# Patient Record
Sex: Female | Born: 1955 | Race: Black or African American | Hispanic: No | Marital: Married
Health system: Midwestern US, Community
[De-identification: ages and names within clinical notes are randomized; demographics above are authoritative.]

## PROBLEM LIST (undated history)

## (undated) DIAGNOSIS — Z4651 Encounter for fitting and adjustment of gastric lap band: Secondary | ICD-10-CM

## (undated) DIAGNOSIS — F419 Anxiety disorder, unspecified: Secondary | ICD-10-CM

## (undated) DIAGNOSIS — B3731 Acute candidiasis of vulva and vagina: Secondary | ICD-10-CM

## (undated) DIAGNOSIS — K219 Gastro-esophageal reflux disease without esophagitis: Secondary | ICD-10-CM

## (undated) DIAGNOSIS — I2510 Atherosclerotic heart disease of native coronary artery without angina pectoris: Secondary | ICD-10-CM

## (undated) DIAGNOSIS — N183 Chronic kidney disease, stage 3 unspecified: Secondary | ICD-10-CM

## (undated) DIAGNOSIS — G8929 Other chronic pain: Secondary | ICD-10-CM

## (undated) DIAGNOSIS — E7800 Pure hypercholesterolemia, unspecified: Secondary | ICD-10-CM

## (undated) DIAGNOSIS — I10 Essential (primary) hypertension: Secondary | ICD-10-CM

## (undated) DIAGNOSIS — M797 Fibromyalgia: Secondary | ICD-10-CM

## (undated) DIAGNOSIS — M5410 Radiculopathy, site unspecified: Secondary | ICD-10-CM

## (undated) DIAGNOSIS — M545 Low back pain, unspecified: Secondary | ICD-10-CM

## (undated) DIAGNOSIS — Z8711 Personal history of peptic ulcer disease: Secondary | ICD-10-CM

## (undated) DIAGNOSIS — D649 Anemia, unspecified: Secondary | ICD-10-CM

## (undated) DIAGNOSIS — I803 Phlebitis and thrombophlebitis of lower extremities, unspecified: Secondary | ICD-10-CM

## (undated) DIAGNOSIS — Z8719 Personal history of other diseases of the digestive system: Secondary | ICD-10-CM

## (undated) DIAGNOSIS — Z9289 Personal history of other medical treatment: Secondary | ICD-10-CM

## (undated) DIAGNOSIS — F32A Depression, unspecified: Secondary | ICD-10-CM

## (undated) DIAGNOSIS — R519 Headache, unspecified: Secondary | ICD-10-CM

## (undated) DIAGNOSIS — R51 Headache: Secondary | ICD-10-CM

## (undated) DIAGNOSIS — F329 Major depressive disorder, single episode, unspecified: Secondary | ICD-10-CM

## (undated) DIAGNOSIS — G894 Chronic pain syndrome: Secondary | ICD-10-CM

## (undated) DIAGNOSIS — G43909 Migraine, unspecified, not intractable, without status migrainosus: Secondary | ICD-10-CM

## (undated) DIAGNOSIS — M1990 Unspecified osteoarthritis, unspecified site: Secondary | ICD-10-CM

## (undated) HISTORY — DX: Radiculopathy, site unspecified: M54.10

## (undated) HISTORY — DX: Chronic pain syndrome: G89.4

## (undated) HISTORY — PX: REVISION TOTAL KNEE ARTHROPLASTY: SHX767

## (undated) HISTORY — PX: LUMBAR FUSION: SHX111

## (undated) HISTORY — PX: RIGHT OOPHORECTOMY: SHX2359

## (undated) HISTORY — PX: JOINT REPLACEMENT: SHX530

## (undated) HISTORY — DX: Atherosclerotic heart disease of native coronary artery without angina pectoris: I25.10

## (undated) HISTORY — PX: LACERATION REPAIR: SHX5168

## (undated) HISTORY — PX: SHOULDER OPEN ROTATOR CUFF REPAIR: SHX2407

## (undated) HISTORY — PX: BACK SURGERY: SHX140

## (undated) HISTORY — DX: Pure hypercholesterolemia, unspecified: E78.00

## (undated) HISTORY — PX: TUBAL LIGATION: SHX77

## (undated) HISTORY — PX: KNEE ARTHROSCOPY: SHX127

## (undated) HISTORY — DX: Anxiety disorder, unspecified: F41.9

## (undated) HISTORY — PX: HIP SURGERY: SHX245

## (undated) HISTORY — PX: FOREARM FRACTURE SURGERY: SHX649

## (undated) HISTORY — DX: Major depressive disorder, single episode, unspecified: F32.9

## (undated) HISTORY — PX: TONSILLECTOMY: SUR1361

## (undated) HISTORY — DX: Depression, unspecified: F32.A

## (undated) HISTORY — PX: TOTAL KNEE ARTHROPLASTY: SHX125

## (undated) HISTORY — PX: ANKLE FRACTURE SURGERY: SHX122

---

## 1975-05-10 HISTORY — PX: CHOLECYSTECTOMY OPEN: SUR202

## 1975-05-10 HISTORY — PX: APPENDECTOMY: SHX54

## 1991-05-10 HISTORY — PX: ABDOMINAL HYSTERECTOMY: SHX81

## 1999-10-14 ENCOUNTER — Ambulatory Visit (HOSPITAL_COMMUNITY): Admission: RE | Admit: 1999-10-14 | Discharge: 1999-10-14 | Payer: Self-pay | Admitting: Gastroenterology

## 1999-10-14 ENCOUNTER — Encounter: Payer: Self-pay | Admitting: Gastroenterology

## 1999-10-26 ENCOUNTER — Encounter: Payer: Self-pay | Admitting: Orthopedic Surgery

## 1999-10-26 ENCOUNTER — Encounter: Admission: RE | Admit: 1999-10-26 | Discharge: 1999-10-26 | Payer: Self-pay | Admitting: Orthopedic Surgery

## 2000-09-25 ENCOUNTER — Encounter: Payer: Self-pay | Admitting: Orthopedic Surgery

## 2000-09-25 ENCOUNTER — Ambulatory Visit (HOSPITAL_COMMUNITY): Admission: RE | Admit: 2000-09-25 | Discharge: 2000-09-25 | Payer: Self-pay | Admitting: Orthopedic Surgery

## 2000-10-30 ENCOUNTER — Encounter: Payer: Self-pay | Admitting: Neurosurgery

## 2000-10-30 ENCOUNTER — Encounter: Admission: RE | Admit: 2000-10-30 | Discharge: 2000-10-30 | Payer: Self-pay | Admitting: Neurosurgery

## 2000-11-13 ENCOUNTER — Encounter: Admission: RE | Admit: 2000-11-13 | Discharge: 2000-11-13 | Payer: Self-pay | Admitting: Neurosurgery

## 2000-11-13 ENCOUNTER — Encounter: Payer: Self-pay | Admitting: Neurosurgery

## 2000-12-04 ENCOUNTER — Encounter: Payer: Self-pay | Admitting: Neurosurgery

## 2000-12-04 ENCOUNTER — Encounter: Admission: RE | Admit: 2000-12-04 | Discharge: 2000-12-04 | Payer: Self-pay | Admitting: Neurosurgery

## 2001-01-16 ENCOUNTER — Ambulatory Visit (HOSPITAL_COMMUNITY): Admission: RE | Admit: 2001-01-16 | Discharge: 2001-01-16 | Payer: Self-pay | Admitting: Orthopedic Surgery

## 2001-01-16 ENCOUNTER — Encounter: Payer: Self-pay | Admitting: Orthopedic Surgery

## 2001-03-29 ENCOUNTER — Encounter: Payer: Self-pay | Admitting: Emergency Medicine

## 2001-03-29 ENCOUNTER — Inpatient Hospital Stay (HOSPITAL_COMMUNITY): Admission: EM | Admit: 2001-03-29 | Discharge: 2001-04-07 | Payer: Self-pay | Admitting: Emergency Medicine

## 2001-03-30 ENCOUNTER — Encounter: Payer: Self-pay | Admitting: Neurosurgery

## 2001-03-31 ENCOUNTER — Encounter: Payer: Self-pay | Admitting: Internal Medicine

## 2001-03-31 ENCOUNTER — Encounter: Payer: Self-pay | Admitting: Infectious Diseases

## 2001-04-02 ENCOUNTER — Encounter: Payer: Self-pay | Admitting: Internal Medicine

## 2001-04-03 ENCOUNTER — Encounter: Payer: Self-pay | Admitting: Internal Medicine

## 2001-04-04 ENCOUNTER — Encounter: Payer: Self-pay | Admitting: Internal Medicine

## 2001-12-19 ENCOUNTER — Encounter: Payer: Self-pay | Admitting: Orthopedic Surgery

## 2001-12-19 ENCOUNTER — Encounter: Admission: RE | Admit: 2001-12-19 | Discharge: 2001-12-19 | Payer: Self-pay | Admitting: Orthopedic Surgery

## 2001-12-26 ENCOUNTER — Encounter: Payer: Self-pay | Admitting: Orthopedic Surgery

## 2001-12-31 ENCOUNTER — Inpatient Hospital Stay (HOSPITAL_COMMUNITY): Admission: RE | Admit: 2001-12-31 | Discharge: 2002-01-03 | Payer: Self-pay | Admitting: Orthopedic Surgery

## 2002-08-14 ENCOUNTER — Encounter: Payer: Self-pay | Admitting: Orthopedic Surgery

## 2002-08-14 ENCOUNTER — Encounter: Admission: RE | Admit: 2002-08-14 | Discharge: 2002-08-14 | Payer: Self-pay | Admitting: Orthopedic Surgery

## 2003-01-14 ENCOUNTER — Encounter: Payer: Self-pay | Admitting: Orthopedic Surgery

## 2003-01-14 ENCOUNTER — Ambulatory Visit (HOSPITAL_COMMUNITY): Admission: RE | Admit: 2003-01-14 | Discharge: 2003-01-14 | Payer: Self-pay | Admitting: Orthopedic Surgery

## 2003-02-17 ENCOUNTER — Inpatient Hospital Stay (HOSPITAL_COMMUNITY): Admission: RE | Admit: 2003-02-17 | Discharge: 2003-02-23 | Payer: Self-pay | Admitting: Orthopedic Surgery

## 2003-02-20 ENCOUNTER — Encounter: Payer: Self-pay | Admitting: Orthopedic Surgery

## 2003-03-07 ENCOUNTER — Ambulatory Visit (HOSPITAL_COMMUNITY): Admission: RE | Admit: 2003-03-07 | Discharge: 2003-03-07 | Payer: Self-pay | Admitting: Orthopedic Surgery

## 2003-05-10 HISTORY — PX: CARDIAC CATHETERIZATION: SHX172

## 2003-10-27 ENCOUNTER — Encounter: Admission: RE | Admit: 2003-10-27 | Discharge: 2003-10-27 | Payer: Self-pay | Admitting: Interventional Cardiology

## 2003-10-28 ENCOUNTER — Inpatient Hospital Stay (HOSPITAL_BASED_OUTPATIENT_CLINIC_OR_DEPARTMENT_OTHER): Admission: RE | Admit: 2003-10-28 | Discharge: 2003-10-28 | Payer: Self-pay | Admitting: Interventional Cardiology

## 2004-11-27 ENCOUNTER — Ambulatory Visit (HOSPITAL_COMMUNITY): Admission: RE | Admit: 2004-11-27 | Discharge: 2004-11-27 | Payer: Self-pay | Admitting: Neurosurgery

## 2005-01-17 ENCOUNTER — Encounter: Admission: RE | Admit: 2005-01-17 | Discharge: 2005-01-17 | Payer: Self-pay | Admitting: Neurosurgery

## 2005-02-22 ENCOUNTER — Encounter: Admission: RE | Admit: 2005-02-22 | Discharge: 2005-02-22 | Payer: Self-pay | Admitting: Neurosurgery

## 2005-03-08 ENCOUNTER — Encounter: Admission: RE | Admit: 2005-03-08 | Discharge: 2005-03-08 | Payer: Self-pay | Admitting: Neurosurgery

## 2005-03-23 ENCOUNTER — Encounter: Admission: RE | Admit: 2005-03-23 | Discharge: 2005-03-23 | Payer: Self-pay | Admitting: Neurosurgery

## 2005-08-17 ENCOUNTER — Encounter: Admission: RE | Admit: 2005-08-17 | Discharge: 2005-08-17 | Payer: Self-pay | Admitting: Neurosurgery

## 2005-08-31 ENCOUNTER — Encounter: Admission: RE | Admit: 2005-08-31 | Discharge: 2005-08-31 | Payer: Self-pay | Admitting: Neurosurgery

## 2005-09-14 ENCOUNTER — Encounter: Admission: RE | Admit: 2005-09-14 | Discharge: 2005-09-14 | Payer: Self-pay | Admitting: Neurosurgery

## 2005-09-28 ENCOUNTER — Other Ambulatory Visit: Admission: RE | Admit: 2005-09-28 | Discharge: 2005-09-28 | Payer: Self-pay | Admitting: Internal Medicine

## 2005-10-27 ENCOUNTER — Encounter: Admission: RE | Admit: 2005-10-27 | Discharge: 2005-10-27 | Payer: Self-pay | Admitting: Orthopedic Surgery

## 2005-11-15 ENCOUNTER — Encounter: Admission: RE | Admit: 2005-11-15 | Discharge: 2005-11-15 | Payer: Self-pay | Admitting: Internal Medicine

## 2005-11-23 ENCOUNTER — Emergency Department (HOSPITAL_COMMUNITY): Admission: EM | Admit: 2005-11-23 | Discharge: 2005-11-23 | Payer: Self-pay | Admitting: Emergency Medicine

## 2005-12-01 ENCOUNTER — Emergency Department (HOSPITAL_COMMUNITY): Admission: EM | Admit: 2005-12-01 | Discharge: 2005-12-01 | Payer: Self-pay | Admitting: Emergency Medicine

## 2006-04-10 ENCOUNTER — Ambulatory Visit (HOSPITAL_COMMUNITY): Admission: RE | Admit: 2006-04-10 | Discharge: 2006-04-11 | Payer: Self-pay | Admitting: Orthopedic Surgery

## 2006-05-09 HISTORY — PX: LUMBAR LAMINECTOMY/DECOMPRESSION MICRODISCECTOMY: SHX5026

## 2006-05-09 HISTORY — PX: ESOPHAGOGASTRODUODENOSCOPY: SHX1529

## 2006-05-30 ENCOUNTER — Inpatient Hospital Stay (HOSPITAL_COMMUNITY): Admission: RE | Admit: 2006-05-30 | Discharge: 2006-06-05 | Payer: Self-pay | Admitting: Neurosurgery

## 2006-11-15 ENCOUNTER — Encounter: Admission: RE | Admit: 2006-11-15 | Discharge: 2006-11-15 | Payer: Self-pay | Admitting: Gastroenterology

## 2007-03-29 ENCOUNTER — Encounter: Admission: RE | Admit: 2007-03-29 | Discharge: 2007-03-29 | Payer: Self-pay | Admitting: Internal Medicine

## 2007-11-07 ENCOUNTER — Encounter: Admission: RE | Admit: 2007-11-07 | Discharge: 2007-11-07 | Payer: Self-pay | Admitting: Internal Medicine

## 2008-12-23 ENCOUNTER — Encounter: Admission: RE | Admit: 2008-12-23 | Discharge: 2008-12-23 | Payer: Self-pay | Admitting: Neurosurgery

## 2009-01-30 ENCOUNTER — Encounter: Admission: RE | Admit: 2009-01-30 | Discharge: 2009-01-30 | Payer: Self-pay | Admitting: Neurosurgery

## 2009-03-12 ENCOUNTER — Encounter: Admission: RE | Admit: 2009-03-12 | Discharge: 2009-03-12 | Payer: Self-pay | Admitting: Neurosurgery

## 2009-04-07 ENCOUNTER — Encounter: Admission: RE | Admit: 2009-04-07 | Discharge: 2009-04-07 | Payer: Self-pay | Admitting: Neurosurgery

## 2010-01-20 ENCOUNTER — Encounter: Admission: RE | Admit: 2010-01-20 | Discharge: 2010-01-20 | Payer: Self-pay | Admitting: Internal Medicine

## 2010-05-30 ENCOUNTER — Encounter: Payer: Self-pay | Admitting: Internal Medicine

## 2010-08-05 ENCOUNTER — Other Ambulatory Visit (HOSPITAL_COMMUNITY)
Admission: RE | Admit: 2010-08-05 | Discharge: 2010-08-05 | Disposition: A | Discharge status: Home / Self Care | Payer: PRIVATE HEALTH INSURANCE | Source: Ambulatory Visit | Attending: Internal Medicine | Admitting: Internal Medicine

## 2010-08-05 ENCOUNTER — Other Ambulatory Visit: Payer: Self-pay | Admitting: Internal Medicine

## 2010-08-05 DIAGNOSIS — Z01419 Encounter for gynecological examination (general) (routine) without abnormal findings: Secondary | ICD-10-CM | POA: Insufficient documentation

## 2010-08-05 DIAGNOSIS — R87810 Cervical high risk human papillomavirus (HPV) DNA test positive: Secondary | ICD-10-CM | POA: Insufficient documentation

## 2010-09-24 NOTE — Discharge Summary (Signed)
[name], [name]                      ACCOUNT NO.:  [id number]   MEDICAL RECORD NO.:  [id number]                   PATIENT TYPE:  INP   LOCATION:  5027                                 FACILITY:  [facility]   PHYSICIAN:  Mila Homer. Sherlean Foot, M.D.              DATE OF BIRTH:  02-Nov-1955   DATE OF ADMISSION:  12/31/2001  DATE OF DISCHARGE:  01/03/2002                                 DISCHARGE SUMMARY   ADMISSION DIAGNOSES:  1. Painful left total knee arthroplasty with radiographic signs of     loosening.  2. Painful right total knee.  3. Gastroesophageal reflux disease.  4. Hiatal hernia.  5. Pseudogout.  6. Peptic ulcer disease.  7. Right Achilles' tendinitis.  8. Obesity.  9. Nocturnal leg cramps.   DISCHARGE DIAGNOSES:  1. Revision of left total knee arthroplasty.  2. Gastroesophageal reflux disease.  3. History of pseudogout.  4. History of hiatal hernia.  5. History of peptic ulcer disease.  6. Obesity.   HISTORY OF PRESENT ILLNESS:  The patient is a 55 year old white female who  had a right total knee arthroplasty in 1998, a left total knee arthroplasty  in 1998 also.  The patient has had about three to four months of gradual-  onset, progressively worsening left knee pain.  It is dull, throbbing and  sharp at times with weightbearing activities.  The pain is diffusely about  the knee with some radiation into the proximal tibia.  It increases with  weightbearing, improves slightly with Vicodin and heating pad and rest.  The  patient does ambulate with a crutch.   X-rays do show possible loosening of the tibial component.   ALLERGIES:  ASPIRIN, CODEINE, IV DYE and SHELLFISH.   CURRENT MEDICATIONS:  1. Estradiol 1 mg p.o. q.d.  2. Xanax 0.5 mg one-half tablet p.o. t.i.d.  3. Vicodin EF one to two tablets p.o. q.4-6h. p.r.n.  4. Caldesene 0.6 mg p.o. b.i.d.  5. Lodine 400 mg p.o. b.i.d.  6. Quinine sulfate 260 mg p.o. b.i.d.  7. Prilosec p.r.n.  8. Propulsid 20  mg p.o. p.r.n.  9. Mylanta p.r.n.   SURGICAL PROCEDURE:  On December 31, 2001, the patient was taken to the  operating room by Dr. Mila Homer. Lucey, assisted by Dr. [name] and  Jamelle Rushing, P.A.  Under general anesthesia, the patient underwent a left  total knee revision with femoral and tibial components.  One Hemovac drain  was left in place.  Estimated blood loss was 300 cc.  The patient tolerated  the procedure well, received a postoperative femoral nerve block and was  transferred to the recovery room and then to the orthopedic floor in good  condition.   CONSULTATIONS:  The following routine consults were requested:  Physical  therapy, occupational therapy, case management.   HOSPITAL COURSE:  On December 31, 2001, the patient was admitted to [facility] under  the care of Dr. Georgena Spurling.  The patient was taken to the  OR where revision of her left total knee arthroplasty was performed.  The  patient tolerated the procedure well, had a postoperative femoral nerve  block and was transferred to the recovery room and then to the orthopedic  floor in good condition for routine postop protocol.  The patient was  started on Lovenox for routine DVT prophylaxis.   The patient then incurred a total of three days' postoperative care on the  orthopedic floor.  Her vital signs remained stable.  She did have some low-  grade temperatures but they resolved on their own without any treatment.  The patient's wound remained benign for any signs of infection.  The leg  remained neuromotor and vascularly intact.  The patient did develop some  lumbar discomfort; this was very similar to the previous hospitalization  which was diagnosed as pseudogout.  She was given a Depo-Medrol injection  for this with gradual improvement.  The patient did very well with physical  therapy and she was ready for discharge to home on postop day #3.   LABORATORY AND ACCESSORY CLINICAL DATA:  CBC on  August 28th:  WBC was 8.3,  hemoglobin 9.1, hematocrit 27.7, platelets 187,000.  Routine chemistries on  August 27th:  Sodium of 138, potassium of 3.7, glucose 101, BUN 8,  creatinine 0.9.  Routine urinalysis on admission was normal.  Left knee  cultures during procedure were all negative of organisms.   MEDICATIONS UPON DISCHARGE FROM ORTHOPEDIC FLOOR:  1. Colace 100 mg p.o. b.i.d.  2. Trinsicon one tablet p.o. t.i.d.  3. Lovenox 30 mg subcut. q.12h.  4. Estradiol 1 mg p.o. q.d.  5. Colchicine 0.6 mg p.o. b.i.d.  6. Quinine sulfate 260 mg p.o. b.i.d.  7. Protonix 40 mg p.o. q.d.  8. Xanax 0.25 mg p.o. t.i.d.  9. Vioxx 25 mg p.o. q.d.  10.      OxyContin CR 10 mg p.o. q.12h.  11.      Laxative or enema of choice p.r.n.  12.      Restoril 15 mg p.o. q.h.s. p.r.n.  13.      Tylenol 650 mg p.o. q.4h. p.r.n.   MEDICATIONS:  1. The patient is to continue on routine home medications.  2. OxyContin CR 10 mg one tablet every 12 hours.  3. Percocet 5 mg one or two tablets every four to six hours for pain if     needed.  4. Lovenox injection 40 mg -- one injection a day for 10 days.  5. Pain medicine -- OxyContin CR and Percocet as noted above.   ACTIVITY:  Activity as tolerated with the use of a walker, may shower, no  driving.   DIET:  No restrictions.   WOUND CARE:  Keep wound clean and dry, check daily for any signs of  infection, call M.D. if any questions.   SPECIAL INSTRUCTIONS:  CPM 0 to 50 degrees, increased by 10 degrees a day.   FOLLOWUP:  Call [phone] for a followup appointment with Dr. Sherlean Foot in 10  days.   CONDITION ON DISCHARGE:  The patient's condition upon discharge from the  orthopedic floor is listed as improved and good.      Jamelle Rushing, P.A.                      Mila Homer. Sherlean Foot, M.D.    RWK/MEDQ  D:  01/21/2002  T:  01/22/2002  Job:  [id number]   cc:   Corwin Levins, M.D. [facility]

## 2010-09-24 NOTE — H&P (Signed)
[name], [name] NO.:  [id number]   MEDICAL RECORD NO.:  [id number]          PATIENT TYPE:  INP   LOCATION:  3310                         FACILITY:  [facility]   PHYSICIAN:  Payton Doughty, M.D.      DATE OF BIRTH:  Jan 11, 1956   DATE OF ADMISSION:  05/30/2006  DATE OF DISCHARGE:                              HISTORY & PHYSICAL   ADMITTING DIAGNOSIS:  Spondylosis, L3-4, L4-5.   SERVICE:  Neurosurgery.   HISTORY:  This is a now 55 year old right-handed white woman.  She has  had numerous orthopedic procedures.  I first saw her in June of 2002.  She had a foraminal disk at 3-4.  She had been managed conservatively  for a number of years now.  She developed increasing pain and pain down  both legs, worse on the right than on the left, and recent MR and  diskography demonstrated significant spondylosis at L3-4 and L4-5, and  she is now admitted for fusion at this level.  She was really going to  have this operation in September.  Her husband was sick.  He has since  passed away, and she is ready for her surgery now.   MEDICAL HISTORY:  Remarkable for menopause.   MEDICATIONS:  She is on estradiol __________, alprazolam 3 times a day,  Vicodin 4 times a day, and Ultram 4 times a day.   ALLERGIES:  She is sensitive to ASPIRIN.   SURGICAL HISTORY:  Remarkable for knee arthroscopies on both sides.  She  has had both knees replaced in the late 1990s.  Shoulder operations in  the 1990s.  Trochanteric bursitis operation in each hip, cholecystectomy  and tonsillectomy in the remote past.   SOCIAL HISTORY:  She does not smoke, does not drink, and is on  disability.   FAMILY HISTORY:  Not given.   REVIEW OF SYSTEMS:  Remarkable for wearing glasses, sore throat,  indigestion, nausea, abdominal pain, gastritis, arm weakness, leg  weakness, back pain, arm pain, joint pain, and arthritis.   PHYSICAL EXAMINATION:  HEENT EXAM:  Within normal limits.  NECK:  She has  reasonable range of motion of the neck.  CHEST:  Clear.  CARDIAC EXAM:  Regular rate and rhythm.  ABDOMEN:  Nontender with no hepatosplenomegaly.  EXTREMITIES:  Without clubbing or cyanosis.  Peripheral pulses are good.  GU EXAM:  Deferred.  NEUROLOGICAL:  She is awake, alert, and oriented.  Cranial nerves are  intact.  Motor exam shows intact strength with some give-way weakness in  the right upper extremity.  EXTREMITIES:  Lower extremity:  Hip flexors are full.  She has give-way  weakness in the right hip flexor, knee extensors, dorsal and palmar  flexors.  She has no current sensory deficits.  Reflexes are preserved  at the ankles and knees.  She is nonmyelopathic.   Diskography results have been reviewed above.  Basically, she has  concordant response at 3-4 and 4-5.   CLINICAL IMPRESSION:  Lumbar spondylosis, intractable back pain.   PLAN:  The plan is for L3-4, L4-5 laminectomy, discectomy, posterior  lumbar interbody fusion,  posterolateral arthrodesis.  The risks and  benefits of the surgery have been discussed with her, and she wished to  proceed.           ______________________________  Payton Doughty, M.D.     MWR/MEDQ  D:  05/30/2006  T:  05/30/2006  Job:  [id number]

## 2010-09-24 NOTE — Consult Note (Signed)
[place]  Patient:    [name], [name] Visit Number: [id number] MRN: [id number]          Service Type: Attending:  Pollyann Savoy, M.D. Dictated by:   Pollyann Savoy, M.D. Proc. Date: 04/03/01   CC:         Titus Dubin. Alwyn Ren, M.D.                          Consultation Report  REFERRING PHYSICIAN:  Titus Dubin. Alwyn Ren, M.D.  CHIEF COMPLAINT:  Fever and pain.  HISTORY OF PRESENT ILLNESS:  The patient is a 55 year old Caucasian female with history of degenerative joint disease and multiple joint replacements in the past. I had seen her almost a year ago for consultation. At the time, her rheumatology workup was negative. According to the patient, a week ago she developed nausea and dizziness after taking her pain medication. She slept and woke up in the middle of the night. At that time, she vomited and could not move. She was having abdominal cramps. She describes excruciating pain in her abdomen, back, and lower extremities. She has a history of degenerative disk disease and had been under the care of Dr. Channing Mutters. According to her, she has had spinal injections in her spine in June of 2002 and January of 2002. She initially felt that maybe something was wrong with her back and she was paralyzed, and gradually her weakness in her lower extremities got worse, and by Thursday morning she could not move her legs at all. She states that 1 of her neighbors who is a nurse came to take care of her. At the time, she was running a very high fever and was unable to move her lower extremities. She was brought to the [facility] Emergency Room by ambulance. She received pain medications and also urgent MRI of her lumbar spine which was unchanged from her previous MRI. The abdominal pain and back pain eased off with the pain medications although the back pain still continues. She describes pain in almost all of her thoracic and lumbar spine. She has a lot of  pain in her right shoulder, bilateral hips, bilateral knee joints, and ankle joints. She was having generalized aches and pains also and had extensive workup for that. She denies any joint swelling. She states that she can move her ankles now, but she is still unable to lift her legs off the bed. She denies any history of any rash. No history of oral ulcers, nasal ulcers, melena, rash, photosensitivities, chest pain, palpitations. There is no history of diarrhea, conjunctivitis, erythritis, or any other illness prior to this.  PAST MEDICAL HISTORY: 1. Right shoulder joint replacement in 1998. 2. Right total knee replacement in February of 1998. 3. Left total knee replacement in December of 1998. 4. Bilateral trochanteric bursa removal in the year 2000.  All of these surgeries have been done by Dr. Brynda Greathouse, and she has been in the care of Dr. Brynda Greathouse for all this period.  FAMILY HISTORY:  Her mom has a history of COPD, degenerative joint disease, and rheumatoid arthritis. Maternal grandmother has rheumatoid arthritis and COPD. Maternal aunt has CHF and COPD, and father had lung carcinoma. Her uncle had history of gout and prostate cancer, and a cousin has rheumatoid arthritis.  SOCIAL HISTORY:  She is disabled secondary to arthritis in her several joints. She is not a smoker, does not drink any alcohol. She does some exercise  on a regular basis.  PHYSICAL EXAMINATION:  GENERAL:  The patient was in no acute distress. She was alert and awake. She expressed a lot of pain during the conversation.  VITAL SIGNS:  Temperature 101, heart rate 88, respiratory rate 20, blood pressure 118/70.  HEENT:  Normocephalic. Pupils are equal, round, and reactive to light. No evidence for oral ulcers, nasal ulcers. No lymphadenopathy or thyromegaly. She had some facial flushing but no melena or rash distribution.  LUNGS:  Clear to auscultation.  CHEST:  Regular rate and rhythm. There was a grade  2 systolic murmur.  ABDOMEN:  Soft. Bowel sounds present. She had generalized tenderness on palpation of her abdomen with even slight touch without any rebound.  EXTREMITIES:  There was no rash, petechiae. She had TED hose in her lower extremities and had tenderness on palpation over her calf muscles bilaterally but also had generalized tenderness.  NEUROLOGIC:  Cranial nerves 2-12 are intact. Her power in her right lower extremity was 3/5 and left lower extremity was 2/5. DTRs were difficult to test because she had a lot of pain even with a slight touch.  MUSCULOSKELETAL:  C-spine had painful range of motion, lateral rotation, flexion, and extension. It was difficult to assess nuchal rigidity because she complained of pain with every direction. The thoracic and lumbar spine, she had tenderness throughout the palpation of her thoracic and lumbar spine and also in her SI joints. Right shoulder abduction was about 70 degrees which was quite painful. She had a surgical scar over her right shoulder from the replacement. There was no effusion present. Left shoulder joint was full range of motion. Elbows, wrist joints, and MCPs, PIPs, TIPs were also range of motion without any synovitis or synovial thickening, although she had tenderness which was generalized. She had bilateral scars over her trochanteric bursa area and also had scars on her bilateral knee joint from the prior knee joint replacement. Both knees were warm to touch. Left knee joint had moderate effusion and also painful range of motion. Fibromyalgia tender points were 18/18 positive, but she had all control points positive and had generalized hyperalgesia.  LABORATORY AND ACCESSORY DATA:  Investigation on reviewing the chart on March 29, 2001, she had an MRI of her lumbar spine which showed L3-4 degenerative disk disease and right finded fluid signal which was confirmed to be discogenic in origin with contrast MRI. She had  slight encroachment of L2-3 nerve root and L4-5. There was mild spinal stenosis. She had an MRI of the  thoracic spine which showed mild degenerative joint disease, abnormal appearance of soft tissue around her C-spine and a small bilateral pleural effusion and thyromegaly. Her MRI scans were reviewed by Dr. Channing Mutters, and he felt there was no interval change from her prior MRIs. She had 2-D echo on April 02, 2001, which was a nondiagnostic study for vegetation. EKG was normal. On March 31, 2001, she had a chest x-ray which showed bilateral atelectasis. A CT of the abdomen and pelvis was within normal limits. On April 03, 2001, she had lower extremity vascular studies which were negative for DVD and positive for right lower extremity Bakers cyst.  The lab work on March 29, 2001, showed a CBC with diff for the white cell count of 19.4 with 90% neutrophils. Gradually the WBC normalized, and on March 31, 2001, it was 8.1.  Her urine and blood cultures on admission are negative to this date. UA was negative. Comprehensive metabolic showed slight elevation  of her LFTs. Mycoplasma pneumonia titer is pending at this time. ANA is pending. CPK was 23. On admission, her sedimentation rate was 11 and on April 01, 2001, her sedimentation rate was 80. On April 02, 2001, her sedimentation rate was 99 with C-reactive protein of 26.8.  So far, she has had cardiology consult, neurosurgery consult, and infectious disease consult.  IMPRESSION AND PLAN: 1. Fever of unknown origin. She has degenerative joint disease, however, 3    joint replacements. 2. History of nausea, vomiting, abdominal pain, and generalized hyperalgesia.    Possible new onset heart murmur. She also has lower extremity weakness and    pain. 3. History of degenerative disk disease. She has had evaluation by Dr. Channing Mutters    and Dr. Franky Macho who both felt that there was no interval change in her MRI    and neurological  findings were not consistent with dermatome. With    regards to fever and elevated sedimentation rates, Dr. Roxan Hockey has been    following her up and all of her cultures have been negative so far. 4. On clinical examination today, she had left knee joint diffusion which    was painful and also warm to touch.  In face of fever and elevated    sedimentation rate, I would recommend to aspirate that fluid to rule out    septic arthritis. She has been under the care of Dr. Brynda Greathouse, and he has    done knee joint replacements on her. I would recommend to consult him to    aspirate the knee joint. Besides degenerative joint disease, I cannot    establish any underlying autoimmune disease on clinical examination today.    Her ANA is pending. Rheumatoid factor is pending. I would also recommend    to add C-ANCA, P-ANCA, and complements to her next labwork. In regards to    hyperalgesia, she has generalized hyperalgesia and is very sensitive to    slight touch. She may have fibromyalgia syndrome or myofascial    pain syndrome, also underlying psychosomatic syndrome which may be    precipitating the pain. It is difficult to establish the criteria for    fibromyalgia syndrome as she has generalized hyperalgesia and has several    control points. Also it is difficult to say the chronicity of her pain, as    it seems acute onset.  She has been on adequate pain medications during    this hospitalization, although she still does not have pain relief. 5. I believe psychological evaluation and physical therapy might be    helpful during this hospitalization if she is cooperative. Dictated by:   Pollyann Savoy, M.D. Attending:  Pollyann Savoy, M.D. DD:  04/03/01 TD:  04/04/01 Job: [id number]

## 2010-09-24 NOTE — Cardiovascular Report (Signed)
[name], [name]                      ACCOUNT NO.:  [id number]   MEDICAL RECORD NO.:  [id number]                   PATIENT TYPE:  OIB   LOCATION:  6501                                 FACILITY:  [facility]   PHYSICIAN:  Lesleigh Noe, M.D.            DATE OF BIRTH:  October 27, 1955   DATE OF PROCEDURE:  10/28/2003  DATE OF DISCHARGE:  10/28/2003                              CARDIAC CATHETERIZATION   INDICATIONS FOR PROCEDURE:  Recurrent chest discomfort with anginal  qualities and a mildly abnormal Cardiolite study.  This study is being done  to rule out significant obstructive disease.   DATE OF PROCEDURE:  October 28, 2003.   PROCEDURE PERFORMED:  1. Left heart catheterization.  2. Selective coronary angiography.  3. Left ventriculography.   DESCRIPTION:  After informed consent, a 4-French sheath was placed in the  right femoral artery using modified Seldinger technique.  A 4-French #1  multipurpose catheter was used for hemodynamic recordings, left  ventriculography by hand injection and selective left and right  coronary  angiography.  We switched to a 4-French #4 left Judkins catheter for left  coronary angiography to get better images.  The patient tolerated the  procedure without complications.   RESULTS:   I. HEMODYNAMIC DATA:  A.  Aortic pressure 153/90.  B.  Left ventricular pressure 152-20.   II. LEFT VENTRICULOGRAPHY:  The left ventricular cavity size is normal.  EF  is 60%.   III. CORONARY ANGIOGRAPHY:  A.  Left main coronary:  Normal.  B.  Left anterior descending coronary:  The LAD covers the anterior wall and  reaches the apex, but does not go around the apex.  Luminal irregularity is  noted in this vessel with a 30% narrowing noted on the mid vessel.  No  significant LAD or diagonal obstructions are noted.  C.  Circumflex artery:  Relatively small giving origin to two obtuse  marginal branches.  The first obtuse marginal is relatively large.  No  significant obstructions are noted in the circumflex.  D.  Right coronary:  This is a large dominant vessel.  PDA is dominant and  is transapical.  Global irregularities are noted in the distal right  coronary, but no significant obstruction.   CONCLUSION:  1. Essentially normal coronary arteries with mild plaquing in the mid left     anterior descending and distal right coronary artery.  2. Normal left ventricular function.  3. Chest pain. Is felt to be noncardiac in origin.  The apical abnormality     noted probably represents soft tissue attenuation.   PLAN:  Further evaluation of chest pain might include GI workup.  I do not  believe further cardiac evaluation is necessary.  Risk factor modification  is recommended since the patient does have documentation of luminal  irregularities.  This should center predominantly with lipid management.  Lesleigh Noe, M.D.    HWS/MEDQ  D:  10/28/2003  T:  10/29/2003  Job:  [id number]   cc:   Georgann Housekeeper, M.D.  [address]  [place]  [place] [address]  Fax: [phone]

## 2010-09-24 NOTE — Op Note (Signed)
NAMEPATRIC, [name] NO.:  [id number]   MEDICAL RECORD NO.:  [id number]          PATIENT TYPE:  INP   LOCATION:  3310                         FACILITY:  [facility]   PHYSICIAN:  Payton Doughty, M.D.      DATE OF BIRTH:  [birth year]   DATE OF PROCEDURE:  05/30/2006  DATE OF DISCHARGE:                               OPERATIVE REPORT   PREOPERATIVE DIAGNOSIS:  Spondylosis L3-L4 and L4-L5.   POSTOPERATIVE DIAGNOSIS:  Spondylosis L3-L4 and L4-L5.   PROCEDURE:  L3-L4 and L4-L5 laminectomy and facetectomy, L3-L4  discectomy and posterior lumbar interbody fusion with right sided fusion  cage, segmental pedicle screw instrumentation from L3 to L5 and  posterolateral arthrodesis L3 to L5.   SURGEON:  Payton Doughty, M.D.   ASSISTANT:  Coletta Memos, M.D.  [place]   ANESTHESIA:  General endotracheal anesthesia.   PREPARATION:  Betadine prep and alcohol wipe.   COMPLICATIONS:  None.   BODY OF TEXT:  This is a 55 year old right handed white lady with  spondylosis and positive diskography at L3-L4 and L4-L5, taken to the  operating room, smoothly anesthetized and intubated, placed prone on the  operating table.  Following shave, prep, and drape in the usual sterile  fashion, the skin was of infiltrated with 1% lidocaine with 1:400,000  epinephrine.  The skin was incised from mid L2 to mid L5 and the lamina  of L3, L4 and the transverse processes of L3, L4, and L5 were exposed  bilaterally in a subperiosteal plane.  Interoperative x-ray confirmed  correctness of the level.  Having confirmed correctness of the level,  the pars articularis, lamina, and inferior facet of L3 and L4 and the  superior facet of L4 and L5 were removed bilaterally using a high speed  drill.  On the right side, there was a lot more compression than on the  left and L3-L4 was a lot worse than L4-L5.  Following complete  decompression of the 3, 4, and 5 roots, discectomy was carried out at L3-  L4  bilaterally. On the right side, it was not feasible place the cage  because of the anatomy.  On the left side, a single 12 x 21 mm cage was  placed. At L3-L4, the anatomy precluded placement of the cage because of  concern about tearing the axilla of the fore root.  Therefore, the  distant was not removed.  Pedicle screws were then placed at L3, L4, and  L5.  They were connected to the rod and locking caps placed and  tightened.  A transverse processes of 3, 4, and 5, were decorticated  bilaterally with a high speed drill and BMP on the extender matrix with  the donor bone mixed was placed across it.  Interoperative x-ray showed good placement of pedicle screws and rods  and good alignment of the spine.  Hemostasis was assured.  Successive  layers of 0 Vicryl, 2-0 Vicryl, and 3-0 nylon were used to close.  Betadine and Telfa dressing were applied and the patient returned to the  recovery room in good condition.  ______________________________  Payton Doughty, M.D.     MWR/MEDQ  D:  05/30/2006  T:  05/30/2006  Job:  [id number]

## 2010-09-24 NOTE — Discharge Summary (Signed)
NAMECALENA, [name] NO.:  [id number]   MEDICAL RECORD NO.:  [id number]          PATIENT TYPE:  INP   LOCATION:  3015                         FACILITY:  [facility]   PHYSICIAN:  Payton Doughty, M.D.      DATE OF BIRTH:  11-05-1955   DATE OF ADMISSION:  05/30/2006  DATE OF DISCHARGE:  06/05/2006                               DISCHARGE SUMMARY   ADMITTING DIAGNOSIS:  Lumbar spondylosis, L3-4, L4-5.   DISCHARGE DIAGNOSIS:  Lumbar spondylosis, L3-4, L4-5.   OPERATIVE PROCEDURES:  L3-4, L4-5 fusion.   SERVICE:  Neurosurgery.   COMPLICATIONS:  None.   DISCHARGE STATUS:  Alive and well.   HISTORY AND PHYSICAL:  A 55 year old right-handed white female whose  history and physical is recounted in the chart.  She basically had  neurogenic radicular claudication for some months, was going to have  this done earlier, but because of the death of her husband, she  postponed it and is now admitted for fusion.   MEDICAL HISTORY:  Remarkable for menopause.  She has had numerous  operations on both sides, had both knees replaced.  Shoulders were  operated on for trochanteric bursitis.  She was admitted after  ascertainment of normal laboratory values and underwent infusion as  above.  Postoperatively, she did well.  Because of the number of  medications she was on, she was monitored in the ICU for a couple of  days.  She was somewhat slow to mobilize for the first couple of days,  but once her mobility was started, she has been up and out of bed using  a walker, walking without much in the way of assistance.  She was weaned  off the PCA and is now taking Percocet and Flexeril, the biggest problem  being spasms in her lumbar spine.  Currently, to confirmatory testing in  her lower extremity strength is full.  Her incisions is dry and well  healing, her vital signs are stable.  She is being discharged home in  the care of her family.   FOLLOWUP:  Will be in the [facility] office in  about a week for sutures.   MEDICATIONS:  1. Percocet.  2. Flexeril.  3. Five days for the ciprofloxacin.           ______________________________  Payton Doughty, M.D.    MWR/MEDQ  D:  06/05/2006  T:  06/05/2006  Job:  [id number]

## 2010-09-24 NOTE — Consult Note (Signed)
[place]  Patient:    [name], [name] Visit Number: [id number] MRN: [id number]          Service Type: Attending:  Pollyann Savoy, M.D. Dictated by:   Pollyann Savoy, M.D. Proc. Date: 04/03/01   CC:         Titus Dubin. Alwyn Ren, M.D. [facility]                          Consultation Report  CHIEF COMPLAINT:  Fever and pain.  HISTORY OF PRESENT ILLNESS:  Ms. [name] is a 55 year old Caucasian female with history of degenerative joint disease and multiple joint replacements in the past.  I had seen her almost a year ago for consultation.  At that time, her rheumatology workup was negative.  According to Ms. Locklear, a week ago she developed nausea and dizziness after taking pain medication.  However, she slept and woke up in the middle of the night.  At that time, she vomited and could not move.  She was having abdominal cramps.  She describes excruciating pain in her abdomen, back, and lower extremities.  She has history of degenerative disk disease and had been under the care of Dr. Channing Mutters.  According to her, she has had spinal injection in July 2002 and January 2002.  She initially felt that maybe something was wrong with her back, and she was paralyzed.  Gradually her weakness in her lower extremities got worse, and by Thursday morning, she could not move her legs at all.  She states that one of her neighbors, who is a Engineer, civil (consulting), came to take care of her.  At that time, she was running very high fever and was unable to move her lower extremities.  She was brought to the [facility] Emergency Room by ambulance.  She received pain medication and also urgent MRI of her lumbar spine which was unchanged from her previous MRI.  The abdominal pain and back pain eased off with the pain medication, although the back pain still continues.  She describes pain in almost all of her thoracic and lumbar spine.  She has a lot of pain in her right shoulder, bilateral  hips, bilateral knee joints, and ankle joints.  She was having generalized aches and pains also and has extensive workup for that.  She denies any joint swelling, states that she can move her ankles now, but she is still unable to lift her legs off the bed.  She denies any history of any rash, no history of ulcers, nasal ulcers, photosensitivity, sickle symptoms, chest pain, or palpitations.  There is no history of diarrhea, conjunctivitis, iritis, or any other illness prior to this.  PAST SURGICAL HISTORY: 1. Right shoulder joint replacement in 1998. 2. Right total knee replacement February 1998. 3. Left total knee replacement December 1998. 4. Bilateral trochanteric bursa removal in 2000.  All of these surgeries have been done by Dr. [name], and she has been under the care of Dr. [name] for all of this period.  FAMILY HISTORY:  Her mom has a history of COPD, degenerative joint disease, and rheumatoid arthritis.  Maternal grandmother has rheumatoid arthritis and COPD; maternal aunt CHF and COPD, and father had lung carcinoma.  Her uncle had history of gout and prostate cancer, and a cousin has rheumatoid arthritis.  SOCIAL HISTORY:  She is disabled secondary to arthritis in her several joints. She is not a smoker, does not drink any alcohol.  She  does some exercise on a regular basis.  PHYSICAL EXAMINATION:  GENERAL:  The patient was in no acute distress.  She was alert, awake, expressed a lot of pain during the conversation.  VITAL SIGNS:  Temperature 101, heart rate 88, respiratory rate 20, blood pressure 118/70.  HEENT:  Normocephalic.  Pupils equal and reactive to light.  No evidence for nasal ulcers.  NECK:  No lymphadenopathy or thyromegaly.  SKIN:  She had some facial flushing but no malar rash distribution.  LUNGS:  Clear to auscultation.  CARDIOVASCULAR:  Regular rate and rhythm with grade 2 systolic murmur.  ABDOMEN:  Soft, bowel sounds present.  She had  generalized tenderness on palpation of her abdomen with even slight touch without any rebound.  EXTREMITIES:  There was no rash, petechiae.  She had TED hose on lower extremities and had tenderness on palpation of her calf muscles bilaterally but also had generalized tenderness.  NEUROLOGIC:  Cranial nerves II-XII intact.  Strength in right lower extremity was 3/5, and left lower extremity was 2/5.  DTRs were difficult to test because she had a lot of pain even with slight touch.  MUSCULOSKELETAL:  C spine had painful range of motion, lateral rotation, flexion, and extension.  It was difficult to assess nuchal rigidity because she complained of pain with every direction.  Thoracic and lumbar spine showed tenderness throughout with palpation and also over SI joint.  Right shoulder joint abduction was about 70 degrees and was quite painful.  She had a surgical scar over her right shoulder from the replacement.  There was no effusion present.  Left shoulder joint had full range of motion.  Elbows, wrist joints, MCP, PIP were also range of motion without any synovitis or synovial thickening, although she had tenderness which was generalized.  She had bilateral scars over trochanteric bursa area and also had scars on her bilateral knee joints from the prior knee joint replacement.  Both knees were warm to touch.  The left knee joint had moderate effusion and also painful range of motion.  Fibromyalgia tender points were 18/18 positive, but she had all control points positive and had generalized hyperalgesia.  LABORATORY DATA:  Investigations on reviewing the chart on November 21, she had MRI of her lumbar spine which showed L3-4 degenerative disk disease and right-sided fluid signal which was found to be discogenic in origin with contrast MRI.  She had slight encroachment of L2-3 nerve root and L4-5.  There was mild spinal stenosis.  She had MRI of thoracic spine which showed mild  degenerative joint disease, abnormal appearance of soft tissue around her C spine, and a small bilateral  pleural effusion and thyromegaly.  Her MRI scans were reviewed by Dr. Channing Mutters, and he felt there was no interval change from her prior MRIs.  Echocardiogram on November 25 was a nondiagnostic study for vegetation.  EKG was normal.  On November 23, she had chest x-ray which showed bilateral atelectasis.  CT of the abdomen and pelvis was within normal limits.  On November 26, she had lower extremity vascular studies which were negative for DVT and positive for right lower extremity bakers cyst.  The lab work on November 21 showed a CBC with differential with a white cell count of 19.4, 90% neutrophils.  Gradually the WBC normalized, and on November 23, it was 8.1.  Urine culture and blood cultures on admission are negative to this date.  UA was negative.  Comprehensive metabolic showed slight elevation of her  LFTs.  Mycoplasma pneumonia titer is pending at this time.  ANA is pending.  CPK was 23.  On admission, her sedimentation rate was 11, and on November 24, her sedimentation rate was 80; November 25, sedimentation rate was 99 with C-reactive protein of 26.8.  CONSULTATIONS:  So far, she has had cardiology consult, neurosurgical consult, and infectious disease consult.  IMPRESSION AND PLAN: 1. Fever of unknown origin.  She has degenerative joint disease with three    joint replacements.  History of nausea, vomiting, abdominal pain, and    generalized hyperalgesia and possible new onset heart murmur.  She also has    lower extremity weakness and pain.  She has a history of degenerative disk    disease.  She has had evaluation by Dr. Channing Mutters and Dr. Franky Macho who both felt    there was no interval change in her MRI, and neurological findings were not    consistent with dermatome.  As regards to fever and elevated sedimentation    rate, Dr. Roxan Hockey has been following her up, and all  her cultures have    been negative so far.  On clinical exam today, she had left knee joint    effusion which was painful and also warm to touch as well as fever and    elevated sed rate.  I would recommend aspiration of fluid to rule out    septic arthritis.  She has been under the care of Dr. [name], and he has    done knee joint replacements on her.  I would recommend to consult him to    aspirate the knee joint. 2. Degenerative joint disease.  I cannot establish any underlying autoimmune    disease.  On clinical exam today, her antinuclear antibody is pending.    Rheumatoid factor is pending.  I would also recommend to add ______  and    complement to her next laboratory work. 3. Generalized hyperanalgesia.  She is very sensitive to slight touch.  She    has fibromyalgia syndrome, myofascial pain syndrome, also underlying    psychosomatic syndrome which may be precipitating the pain.  It is    difficult to establish the criteria for fibromyalgia syndrome, and she has    generalized hyperalgesia and had several control points.  Also, it is    difficult to state the chronicity of her pain as it seems acute onset.    She has been on adequate pain medications during this hospitalization,    although she still does not have pain relief.  I believe psychological    evaluation and physical therapy might be helpful during this    hospitalization if she is cooperative.  Dictated by:   Pollyann Savoy, M.D. Attending:  Pollyann Savoy, M.D. DD:  04/03/01 TD:  04/04/01 Job: [id number]

## 2010-09-24 NOTE — Consult Note (Signed)
[place]  Patient:    [name], [name] Visit Number: [id number] MRN: [id number]          Service Type: MED Location: 3000 3013 01 Attending Physician:  Emeterio Reeve Dictated by:   Payton Doughty, M.D. Proc. Date: 03/29/01 Admit Date:  03/29/2001   CC:         Titus Dubin. Alwyn Ren, M.D. [facility]   Consultation Report  REFERRING PHYSICIAN:  Titus Dubin. Alwyn Ren, M.D.  HISTORY OF PRESENT ILLNESS:  I was asked to see this 55 year old, right-handed, white female who I had seen once in my office in July.  She had L3-4 lumbar disk disease without nerve root encroachment.  Lumbar epidural steroids were not helpful in July.  Last night, she rolled over to vomited and had low back pain extending down both legs and up the interscapular region. She now has complaints in her shoulders, in the back of her neck, in between her shoulder blades, down her back, and down into her lower extremities.  She says that she cannot touch her legs because they hurt and she is unable to move them because of the pain.  She also is struggling to move her right shoulder and her left arm.  The MRI done in the [facility] Emergency Room L3-4 disk disease similar to what we seen in July without any nerve root encroachment.  There is a question of some infection in the disk space.  PAST MEDICAL HISTORY:  Remarkable for gastritis.  PAST SURGICAL HISTORY:  Bilateral knee replacements, numerous ankle and shoulder procedures, appendectomy, cholecystectomy, and hysterectomy.  MEDICATIONS:  Estradiol and Vicodin.  ALLERGIES:  ASPIRIN secondary to GI upset.  PHYSICAL EXAMINATION:  She is awake and alert and oriented x 3.  Pupils equal, round, and reactive to light.  Her extraocular movements are intact.  Her facial movement and sensation are intact.  The tongue is in the midline. Shoulder shrug is normal.  She complains of an inability to move her legs secondary  to pain.  Passive movement of the legs causes her to cry with pain. She will not attempt motor testing, except dorsiflexion and plantar flexion of the toes.  When I left the room, she fell off to sleep.  On return to check her reflexes, she had pain with touching in her knees.  Her ankle jerks were intact.  She also complains of headaches.  She would not allow reflex testing at the knees.  The upper extremity complaints are similar.  Strength testing was very poorly performed.  She does not complain of any numbness and the reflexes are intact.  CLINICAL IMPRESSION:  A patient with diffuse pain complaints, which are nondermatomal and nonanatomic in distribution.  The lumbar MRI is relatively unimpressive and the pain complaints are out of proportion to the radiographic findings.  If the concern is infection, I would recommend a contrasted MR of the thoracic and lumbar spine, although given the diffuse nature of her complaints, I doubt that this would be very revealing.  I also recommend symptomatic pain management.  I will continue to check on the patient while in the hospital. Dictated by:   Payton Doughty, M.D. Attending Physician:  Emeterio Reeve DD:  03/29/01 TD:  03/30/01 Job: [id number]

## 2010-09-24 NOTE — H&P (Signed)
[name], [name]                      ACCOUNT NO.:  [id number]   MEDICAL RECORD NO.:  [id number]                   PATIENT TYPE:  INP   LOCATION:  NA                                   FACILITY:  [facility]   PHYSICIAN:  John L. Rendall, M.D.               DATE OF BIRTH:  Feb 16, 1956   DATE OF ADMISSION:  01/20/2003  DATE OF DISCHARGE:                                HISTORY & PHYSICAL   CHIEF COMPLAINT:  Constant right total knee arthroplasty knee pain.   HISTORY OF PRESENT ILLNESS:  The patient is a 55 year old white female with  a history of bilateral total knee arthroplasties with the right total knee  arthroplasty being placed in 1998.  The patient states that she has been  having chronic and severe right knee pain since the first of this year.  It  is a chronic, achy sensation with severe sharp pains with any range of  motion.  It significantly worsens with any ambulation.  It does radiate up  and down the entire leg.  She does have mechanical symptoms along the medial  joint region.  X-rays and bone scan both indicate loosening of total knee  arthroplasty.   ALLERGIES:  IV DYE, ASPIRIN, CODEINE, SHELLFISH.   MEDICATIONS:  1. Xanax 0.25 mg p.o. b.i.d.  2. Vicodin 7.5 mg p.r.n.  3. Flexeril 10 mg p.o. t.i.d.  4. Colchicine 0.5 mg p.o. b.i.d.  5. Quinine sulfate 260 mg p.o. b.i.d.  6. Pepcid complete p.o. b.i.d.  7. Mylanta p.r.n.   PAST MEDICAL HISTORY:  1. History of hiatal hernia.  2. History of pseudogout.  3. History of peptic ulcer disease.  4. History of reflux.  5. History of chronic leg cramps.  6. Obesity.  7. Gastroesophageal reflux disease.   PAST SURGICAL HISTORY:  1. Tonsillectomy in 1972.  2. Cholecystectomy and appendectomy in 1977.  3. Right and left knee arthroscopies in 1979, 1980 and 1991.  4. Right hand surgical repair in 1992.  5. Partial hysterectomy in 1993.  6. Right ovary removal in 1994.  7. Left knee arthroscopy in 1997.  8. Right  total knee arthroplasty in 1998.  9. Left knee arthroscopy repeat in 1998.  10.      Right shoulder closed reduction x2 in 1998.  11.      Right shoulder reconstruction in 1998.  12.      Left total knee replacement in 1998.  13.      Bilateral hip bursectomy in 2000.  14.      Revision of left total knee arthroplasty in 2003.  15.      The patient denies any surgical complications of the above     mentioned surgical procedures.   SOCIAL HISTORY:  The patient is a healthy appearing, slightly heavyset 51-  year-old female.  Denies any history of smoking or alcohol use.  She is  married.  Has no  children.  She lives in a one story house.  She is  currently disabled.   PRIMARY CARE PHYSICIAN:  Dr. Donette Larry at [facility].   FAMILY HISTORY:  Mother is deceased from complications of chronic  obstructive pulmonary disease.  Father is deceased from complications of  lung cancer.  The patient has one sister alive and in good medical health.   REVIEW OF SYSTEMS:  Positive for glasses at all times.  She does have  problems with reflux, nausea and vomiting which is usually fairly well  controlled with the Pepcid complete and the Mylanta.  She does have problems  occasionally with constipation which is relieved with over-the-counter  medications.  She does have occasional urinary urgency.  The patient,  otherwise, has no other significant review of systems complaints not related  to her knee in the sensory, respiratory, cardiac, GI, GU, hematologic,  musculoskeletal, neurologic or mental status issues at this time.   PHYSICAL EXAMINATION:  VITAL SIGNS:  Blood pressure is 140/78, pulse 80 and  regular with alternating pulse intensity with respirations.  Respirations  were 14.  The patient was afebrile.  GENERAL APPEARANCE:  This is a heavy set white female ambulates with a cane  and a right sided hand with a significant limp.  She is able to get on and  off the exam table without any  difficulty.  She is slightly tearful at this  time questioning whether she wants to put off surgical procedure until later  in the year.  HEENT:  Head was normocephalic.  Pupils equal, round and reactive to light  and accomodation.  Sclerae was slightly injected, red.  Conjunctivae was  pink and moist.  Extraocular movements intact.  External nares without  deformities.  TM's pearly grey and intact.  Gross hearing intact.  Oral  mucosa was pink and moist.  NECK:  Supple.  No palpable lymphadenopathy.  Thyroid region was nontender.  The patient had good range of motion of most of her cervical spine without  any difficulty.  CHEST:  Upper extremities were symmetrically sized and shaped.  She had  elevation of the shoulders to about 120 degrees and full extension  anteriorly.  Motor strength was 5/5.  She had full range of motion of her  elbows and wrists.  HEART:  Regular rate and rhythm.  S1, S2 ausculted.  No murmurs, rubs or  gallops noted.  LUNGS:  Sounds were very distant posterior.  Anterior, they were clear  bilaterally.  No wheezes, rales, rhonchi or rubs noted.  ABDOMEN:  Round, obese-like, bowel sounds normoactive throughout.  She was  nontender.  Unable to palpate any hepatosplenomegaly due to obesity.  CVA  region was nontender.  EXTREMITIES:  Left hip had full extension, flexion up to 115 degrees with 10  degrees internal/external rotation without any difficulty.  Right hip had  full extension/flexion up to 90 degrees limited by knee pain.  No hip pain.  No mechanical symptoms in the hip.  Left knee had well-healed midline  surgical incision.  No palpable effusion.  Full extension/flexion back to  110 degrees with no significant instability.  No effusions.  Calf is  nontender.  Right knee was generally tender throughout.  No signs of  erythema or ecchymosis.  A well-healed midline surgical incision.  She was about 10 degrees short of full extension/flexion back to about 80  degrees.  There was no gross instability, but she was very uncomfortable with any exam  about the  knee.  The calf was nontender.  Bilateral ankles were  symmetrically sized and shaped, but the patient had significant tenderness  generally throughout the right ankle to any range of motion or palpation.  There was no gross bony defects noted.  PERIPHERAL VASCULAR:  Carotid pulses were 2+, radial pulses 2+, dorsalis  pedis pulses were 2+ and unable to palpate posterior tibial.  She had no  significant lower extremity pigmentation changes.  NEUROLOGICAL:  The patient was conscious, alert and appropriate.  Held  decent conversations with examiner.  Cranial nerves II-XII were grossly  intact.  She had no gross neurologic defects noted.  She was grossly intact  to light touch sensation.  In fact, she was extremely tender to very light  touch sensation around the right ankle.  BREAST/RECTAL/GU:  Deferred at this time.   IMPRESSION:  1. 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  2. History of hiatal hernia.  3. History of peptic ulcer disease.  4. History of pseudogout.  5. History of bilateral lower extremity leg cramps.  6. Gastroesophageal reflux disease.  7. History of pseudogout, chronic.   PLAN:  The patient will undergo all routine labs and tests prior to having a  revision of her right total knee arthroplasty by Dr. [name] at [facility] on January 20, 2003.  Due to issues in the past with  reimbursement and cost of Arixtra, the patient would prefer to be placed on  Coumadin this time for DVT prophylaxis.      Jamelle Rushing, P.A.                      John L. [name], M.D.    RWK/MEDQ  D:  01/14/2003  T:  01/14/2003  Job:  [id number]

## 2010-09-24 NOTE — Discharge Summary (Signed)
[name], [name]                      ACCOUNT NO.:  [id number]   MEDICAL RECORD NO.:  [id number]                   PATIENT TYPE:  INP   LOCATION:  5012                                 FACILITY:  [facility]   PHYSICIAN:  John L. Rendall, M.D.               DATE OF BIRTH:  [birth year]   DATE OF ADMISSION:  02/17/2003  DATE OF DISCHARGE:  02/23/2003                                 DISCHARGE SUMMARY   ADMISSION DIAGNOSES:  1. End-stage osteoarthritis right knee.  2. Hypertension.  3. Gastroesophageal reflux disease.  4. Pseudogout.  5. Anxiety.   DISCHARGE DIAGNOSES:  1. Revision of right total knee arthroplasty due to failed total knee     arthroplasty.  2. Postoperative blood loss anemia.  3. Hypokalemia.   HISTORY OF PRESENT ILLNESS:  The patient is a 55 year old white female with  a right total knee arthroplasty.  The patient has been having increased pain  in the total knee arthroplasty.  X-rays reveal possible loosening of the  components so she was admitted for a revision of her total knee  arthroplasty.   ALLERGIES:  1. ASPIRIN.  2. CODEINE.  3. IV DYE.  4. SHELLFISH.   CURRENT MEDICATIONS:  1. Estradiol 1 mg p.o. daily.  2. Alprazolam 0.5 mg p.o. t.i.d.  3. Vicodin p.r.n.  4. Quinine sulfate 260 mg p.o. b.i.d. p.r.n.  5. Flexeril 10 mg p.o. b.i.d.  6. Colchicine 0.6 mg p.o. daily.   SURGICAL PROCEDURE:  On February 17, 2003 the patient was taken to the OR by  Dr. Erasmo Leventhal, assisted by Rexene Edison, P.A.-C.  Under general anesthesia  the patient underwent a full revision of her right total knee arthroplasty  with a Sigma system.  The patient tolerated the procedure well; there were  no complications.  A femoral nerve block was given at the end of the case  and the patient was transferred to the recovery room and then to the  orthopedic floor for routine postoperative care.   CONSULTS:  Physical therapy, occupational therapy, rehab.   HOSPITAL COURSE:  On  February 17, 2003 the patient was admitted to [facility] under the care of Dr. [name].  The patient was taken to  the OR where a revision of her right total knee arthroplasty was performed.  There were no complications.  The patient tolerated the procedure well and  she was transferred to the recovery room and then to the orthopedic floor in  good condition.  The patient was then placed on Coumadin for routine DVT  prophylaxis per routine protocol.   The patient then incurred a total of six days postoperative care on the  orthopedic floor in which the patient did have some issues with low-grade  temperatures.  The patient had no obvious sources of the temperature.  They  were monitored and the patient eventually did develop some slightly  productive cough.  She was placed on Zithromax.  X-rays showed just some  mild atelectasis and the patient gradually did improve over the next several  days with becoming afebrile.   The patient had no other significant medical issues arise.  Her leg remained  neuromotor vascularly intact.  The wound remained benign for any signs of an  infection.  The patient worked well with physical therapy.  On postoperative  day #6 the patient's white count was 5.8, she was afebrile.  She was alert  and awake, no complaints whatsoever.  Her pain was well controlled.  Her  right knee wound was benign for any signs of infection and the patient was  eager to be discharged to home with home health physical therapy, so  arrangements were made and she was discharged to home with routine home  health physical therapy and Coumadin protocol arrangements made, with follow-  up in one week.  She was discharged in good condition.   LABORATORY DATA:  1. Chest x-ray on October 14 showed increasing linear left basal opacity     most compatible with atelectasis, no definite pneumonia.  Radiographic     follow-up is recommended if the patient's symptoms  persist.  2. Routine hematology on October 16:  Wbc 5.8, hemoglobin 8.9, hematocrit     25.9, platelets 247.  3. INR of 1.6.  4. October 14:  Sodium of 137, potassium of 3.5, glucose 125, BUN 5,     creatinine 0.8.  This was felt to be elevated due to the patient's     possible atelectasis or early pneumonia and being placed on antibiotics     and postoperative stress.  5. Routine urinalysis on October 16 was just cloudy; it was otherwise     negative for any bacteria or signs of infection.  6. Wound cultures from intraoperatively showed no growth after two days.   MEDICATIONS UPON DISCHARGE FROM ORTHOPEDICS FLOOR:  1. Estradiol 1 mg p.o. daily.  2. Xanax 0.5 mg p.o. t.i.d. p.r.n.  3. Colchicine 0.6 mg p.o. b.i.d.  4. Flexeril 10 mg p.o. t.i.d.  5. Protonix 40 mg p.o. daily.  6. Colace 100 mg p.o. b.i.d.  7. Trinsicon one tablet p.o. t.i.d.  8. Laxative or enema of choice p.r.n.  9. Percocet one or two tablets q.4-6h. p.r.n.  10.      Phenergan 25 mg p.o. q.6h. p.r.n.  11.      Tylenol 650 mg p.o. q.4h. p.r.n.  12.      Restoril 15 mg p.o. q.h.s. p.r.n.  13.      Quinine sulfate 260 mg p.o. b.i.d.  14.      Potassium chloride 20 mEq p.o. t.i.d.  15.      OxyContin CR 10 mg p.o. q.12h.  16.      Zithromax 500 mg p.o. daily with 250 mg daily for five more days.  17.      Coumadin 5 mg p.o. daily.   DISCHARGE INSTRUCTIONS:  1. Medications:  The patient is to resume routine home medications.  2. OxyContin CR 10 mg twice a day.  3. OxyIR 5 mg one or two tablets q.4-6h. for pain.  4. Coumadin 5 mg tablet once a day.  5. Activity:  As tolerated.  6. Diet:  No restrictions.  7. Wound care:  Keep wound clean and dry.  8. Follow-up:  The patient is to call for a follow-up appointment in one     week.   CONDITION UPON DISCHARGE  TO HOME:  Listed as improved and good.      Jamelle Rushing, P.A.                      John L. [name], M.D.   RWK/MEDQ  D:  04/01/2003  T:  04/01/2003   Job:  [id number]

## 2010-09-24 NOTE — Consult Note (Signed)
[place]  Patient:    [name], [name] Visit Number: [id number] MRN: [id number]          Service Type: MED Location: 3000 [id number] Attending Physician:  Emeterio Reeve Dictated by:   Rollene Rotunda, M.D. [facility] Proc. Date: 04/02/01 Admit Date:  03/29/2001                            Consultation Report  DATE OF BIRTH:  11-15-1955  REFERRING PHYSICIAN:  Titus Dubin. Alwyn Ren, M.D.  REASON FOR CONSULTATION:  Evaluate patient with fevers and a heart murmur.  HISTORY OF PRESENT ILLNESS:  The patient is a 55 year old white female with no prior cardiac history.  She was admitted on November 22 with lower extremity pain and inability to move her legs.  The patient has a long history of degenerative joint disease.  She lives with chronic pain ambulating with a walker.  On the morning before admission she said she "just didnt feel right."  However, she was not having any new symptoms.  In particular, she denies any fevers at home.  She reported no chills or shaking sweats.  She has had no cough, diarrhea.  She has baseline nausea and did become nauseated on the day of admission.  This occasionally happens to her.  She has had no recent exposures, travel, or procedures.  She had actually been in her usual state of health until the early morning of admission when she woke to vomit and realized she could not move.  She has been hospitalized with an extensive work-up including a neurologic evaluation.  MRI with contrast and noncontrast has demonstrated no spinal abnormalities.  There have been no other neurologic findings to indicate a reason for her severe joint pain and inability to move her lower extremities except for minimal range of motion.  She has been noted to have fevers to 102.  As I review the chart these seem to be somewhat cyclical in nature peaking approximately every 16 hours.  Blood cultures x 2 have been negative.  Urinalysis has  been negative.  Her white blood cell count initially was elevated at 19 with a left shift.  She initially had a normal sedimentation rate which is now elevated at 80.  She is being followed by the infectious disease service.  She did have an echocardiogram today which demonstrated poor acoustic windows. However, there were no significant valvular lesions.  Of note, there were no vegetations noted.  Admittedly, the mitral and aortic valves were difficult to visualize.  PAST MEDICAL HISTORY:  Hiatal hernia, osteoarthritis, degenerative joint disease.  PAST SURGICAL HISTORY:  Appendectomy, cholecystectomy, abdominal hysterectomy/BSO, bilateral total knee replacements, right shoulder surgery.  ALLERGIES:  ASPIRIN causes GI upset.  MEDICATIONS:  Estradiol, Vicodin, Mylanta, Tequin 400 mg IV q.24h. (started in the hospital after having persistent fevers on Cipro and nafcillin).  SOCIAL HISTORY:  Patient lives in [place] with her husband.  She is disabled.  She does not smoke cigarettes or drink alcohol.  She denies any IV drug use.  FAMILY HISTORY:  Father had myocardial infarction at age 88.  REVIEW OF SYSTEMS:  Positive for headache, shortness of breath with moderate activity secondary to her poor exercise tolerance and deconditioning, diffuse abdominal pain, diffuse joint pain particularly in the lower extremities. Otherwise, as stated in the HPI, negative for all other systems.  PHYSICAL EXAMINATION  GENERAL:  Patient is in mild  distress with palpation of her abdomen or passive movement of her lower extremities.  VITAL SIGNS:  Temperature 100.6, pulse 90 and regular, blood pressure 118/69, 96% saturation on room air.  HEENT:  Eyelids:  Unremarkable.  Pupils are equal, round and reactive to light.  Fundi not visualized.  Oral mucosa:  Unremarkable.  No subconjunctival hemorrhages.  NECK:  No jugular venous distention.  No bruits.  No thyromegaly.  LYMPHATICS:  No  cervical, axillary, or inguinal adenopathy.  LUNGS:  Clear to auscultation bilaterally.  BACK:  No costovertebral angle tenderness.  CHEST:  Unremarkable.  HEART:  PMI not displaced or sustained.  S1 and S2 within normal limits.  No S3.  No S4.  A 2/6 early systolic outflow murmur consistent with a flow murmur.  No diastolic murmurs.  ABDOMEN:  Mild diffuse tenderness.  No splenomegaly.  No hepatomegaly.  No rebound.  No guarding.  No bruits.  SKIN:  No rashes.  No nodules.  No Oslers nodes.  No Janeway lesions.  EXTREMITIES:  Pulses 2+ throughout.  No edema.  No cyanosis.  No clubbing.  No splinter hemorrhages.  NEUROLOGIC:  Oriented to person, place, and time.  Cranial nerves grossly intact.  Upper motor grossly intact.  Lower not tested secondary to pain.  She could wiggle both toes.  LABORATORIES:  Chest x-ray:  Bibasilar atelectasis.  Could not rule out mediastinal adenopathy on the AP film.  EKG is pending.  Echocardiogram:  See HPI.  White blood cells 8.1, hemoglobin 11.7.  CRP 26.8.  ASSESSMENT AND PLAN:  Fever/murmurs.  The patient has persistent fevers in the hospital.  She denies a history of this recently at home.  She has no significant risk factors for endocarditis (i.e. no intravenous drug abuse, no recent dental or other procedures, no antecedent valve disease).  Her physical examination does not demonstrate any stigmata of endocarditis.  Two blood cultures taken off of antibiotics so far have been negative.  The echocardiogram revealed poor image quality and did not demonstrate any obvious valvular lesions or vegetations that would meet criteria.  At this point I am not suggesting a TEE as there is nothing else to lead [place] in the direction of possible bacterial endocarditis.  I would suggest possibly two more blood cultures if these have not been drawn (preferably off of antibiotics or with adequate dilution to remove any antibiotic effect).  We could also  obtain a rheumatoid factor if she is known to be rheumatoid factor  negative as this is a [facility] minor criteria.  I will keep my mind open towards this diagnosis and follow with you.  Will also discuss with Dr. Roxan Hockey. Dictated by:   Rollene Rotunda, M.D. [facility] Attending Physician:  Emeterio Reeve DD:  04/02/01 TD:  04/02/01 Job: [id number]

## 2010-09-24 NOTE — Discharge Summary (Signed)
[place]. [facility]  Patient:    [name], [name] Visit Number: [id number] MRN: [id number]          Service Type: MED Location: 3000 [id number] Attending Physician:  Emeterio Reeve Dictated by:   Valetta Mole Swords, M.D. [facility] Admit Date:  03/29/2001 Disc. Date: 04/07/01   CC:         Corwin Levins, M.D. [facility]  Pollyann Savoy, M.D.  John L. Dorothyann Gibbs, M.D.   Discharge Summary  DISCHARGE DIAGNOSES:  1. Pseudogout.  2. Febrile illness likely related to #1.  3. History of osteoarthritis with bilateral knee replacements and right     shoulder replacement.  4. History of hiatal hernia.  5. Status post appendectomy.  6. Status post hysterectomy and oophorectomy.  7. Status post cholecystectomy.  8. Degenerative disk disease of the spine.  9. Anxiety disorder. 10. Hyperalgesia.  DISCHARGE MEDICATIONS:  1. Naprosyn 250 mg p.o. t.i.d.  2. Estradiol at usual home dose.  3. Vicodin at usual home dose (prescribed by Dr. [name]).  4. Klonopin 0.5 mg p.o. q.12h. p.r.n. anxiety, #14 are given.  5. Protonix 40 mg p.o. q.d.  CONDITION ON DISCHARGE:  Improved.  FOLLOW-UP:  She should see Dr. [name], Dr. Corliss Skains, and Dr. Jonny Ruiz, all within the next month.  PROCEDURES:  1. MRI of the lumbar spine demonstrated mild levoscoliosis of the lumbar     spine at the L3-L4 level.  The disk is degenerated, and the disk space is     narrowed, more notably on the right than the left.  There was some     question of increased signal intensity of a degenerated disk on the right,     raising some concern of infection, but also could be just related to     discogenic disease.  There was slight encroachment on the left L2 and L3     nerve root, but they did not seem significantly compressed.  2. MRI of the lumbar spine repeated on March 30, 2001.  The abnormality     seen on the right ______ to the right L3-L4 disk space does not     demonstrate significant  enhancement, more likely to be discogenic in     origin rather than infectious.  There was mild spinal stenosis and    bilateral neural foraminal narrowing at the L3-L4 level with slight    encroachment upon the course of the exiting L3 nerve roots, left greater    than right.  In the L2-L3 area there is a left lateral bulge protrusion    which encroaches upon the course of the exiting left L2 nerve root which    does not appear significantly compressed or displaced.  There is L4-L5 mild    spinal stenosis. 3. MRI of the thoracic spine demonstrates mild degenerative changes throughout    the lower cervical and thoracic spine where there is mild thoracic    kyphosis at the T7-8 level.  There are mild bulges at several levels with    superimposed small disk protrusions.  No significant cord compression.  No    abnormal signal of the cord is noted.  No epidural abscess is identified. 4. Chest x-ray performed March 31, 2001, demonstrates bilateral atelectasis    at the bases.  Adenopathy could not be excluded in the mediastinum. 5. A two-view chest was performed on April 02, 2001, which only    demonstrated atelectatic changes at the bases with small  bilateral pleural    effusions.  No comment on the mediastinal abnormality noted previously. 6. MRI of the cervical spine on April 02, 2001, demonstrated some    prominent veins along the lower cervical and upper thoracic spine.  Minimal    degenerative changes at the cervical and upper thoracic spine noted. 7. A PICC line was placed in the right arm on April 03, 2001. 8. Bilateral knee x-rays performed April 04, 2001:  Left knee demonstrated    a small joint effusion and mild lucency of the medial tibial component of    the prosthesis.  The right knee demonstrated joint effusion and total knee    replacement. 9. Doppler studies of the lower extremities demonstrated no evidence of DVT or    superficial thrombosis on the left.  There  was evidence of a Bakers cyst    on the right.  LABORATORY DATA:  Cultures from both knee effusions are negative to date. Cell count from one knee demonstrated a white blood cell count of 2050, 59% neutrophils, crystal exam consistent with calcium pyrophosphate crystals. Cell count on the other knee demonstrated a white count of 9000, with 80% neutrophils, extra calcium pyrophosphate crystals seen.  C4 complement level was 38, C3 complement level 193.  Blood cultures to date have been negative. Mycoplasma pneumoniae antibody IgM level was 88 (reference range less than 770).  Blood cultures on March 29, 2001, negative to date.  Rheumatoid factor less than 20, ANA negative.  C-reactive protein 26.8.  Vitamin B12 level 643.  Magnesium 1.9.  Sedimentation rate 80.  BMET normal except for a BUN of 5 on April 01, 2001.  CMET was normal on March 31, 2001, except for an SGPT of 45.  Note, CBC on admission was 19.4.  Urine culture performed was negative.  CONSULTATIONS: 1. Dr. Corliss Skains from rheumatology. 2. Dr. Antoine Poche, cardiology. 3. Dr. Burnice Logan, infectious disease. 4. Dr. [name], orthopedics.  The patient was admitted to the hospital on March 30, 2001, by Dr. Alwyn Ren. See that note for details.  HOSPITAL COURSE: #1 - INFECTIOUS DISEASE:  The patient presented to the hospital with diffuse pain, fever, elevated white count.  There was some concern during the hospitalization of endocarditis, infected prosthetic joints, fever of unknown origin.  The patient was seen in consultation by Dr. Burnice Logan.  The patient was treated intermittently during the hospitalization with ______ and Tequin given her fevers with elevated white count.  The patient eventually underwent arthrocentesis of both knees, found to have calcium pyrophosphate crystals.  The patient was placed on naproxen.  Her fevers defervesced.  Her  white count normalized prior to being placed on naproxen.  The  patient defervesced, began to feel much better, was able to ambulate with her usual aids prior to discharge.  #2 - ORTHOPEDICS:  The patient complained of multiple aches and pains, underwent the x-rays and MRI scans as above.  Eventually underwent bilateral knee arthrocentesis with results as above and treatment as above.  The patient was seen in consultation by Dr. Corliss Skains, who did not think she had a rheumatologic disorder other than the pseudogout.  She does have evidence of fibromyalgia.  #3 - PSYCHIATRIC:  The patient was treated for an anxiety disorder in the hospital with Klonopin.  This will need to be followed up as an outpatient. It is worth noting that the patient at times would have episodes of anxiety in the hospital and also have significant physical complaints which were not  substantiated by exam.  #4 - CARDIAC:  The patient was noted to have a heart murmur on exam.  Given her fevers, she was seen in consultation by Dr. Antoine Poche, who did not think a transesophageal echocardiogram was necessary at this time.  She did have a transthoracic echocardiogram which demonstrated normal left ventricular function, possible hypokinesis of the mid distal anteroseptal wall, moderate hypokinesis of the apical periapical wall.  Also noted was an increased relative contribution of the atrial contraction in left ventricular filling. There was mild aortic valve regurgitation and mild left atrial dilation.  No evidence of vegetations were identified, but it was difficult to evaluate mitral and aortic valves.  DISPOSITION:  At the time of discharge the patient was able to ambulate with her usual aids.  She was not febrile, and she was tolerating her medications well. Dictated by:   Valetta Mole Swords, M.D. [facility] Attending Physician:  Emeterio Reeve DD:  04/07/01 TD:  04/07/01 Job: [id number]

## 2010-09-24 NOTE — Op Note (Signed)
[name], [name]            ACCOUNT NO.:  [id number]   MEDICAL RECORD NO.:  [id number]          PATIENT TYPE:  OIB   LOCATION:  1508                         FACILITY:  [facility]   PHYSICIAN:  John L. Rendall, M.D.  DATE OF BIRTH:  1955/11/23   DATE OF PROCEDURE:  04/10/2006  DATE OF DISCHARGE:                               OPERATIVE REPORT   PREOPERATIVE DIAGNOSIS:  Rotator cuff tear left shoulder and  acromioclavicular joint arthritis.   SURGICAL PROCEDURES:  Open repair chronic rotator cuff tear with  acromioplasty and bursectomy plus open distal clavicle resection.   POSTOPERATIVE DIAGNOSIS:  Open repair chronic rotator cuff tear with  acromioplasty and bursectomy plus open distal clavicle resection, plus  chronic bursitis.   SURGEON:  Rendall, M.D.   ASSISTANT:  Rexene Edison, P.A.-C.   ANESTHESIA:  General with shoulder block.   PATHOLOGY:  The patient has a 3 x 1-cm tear of the rotator cuff.  The  center of it is retracted about 3 cm and is separated 1 cm at the  greater tuberosity.  There is a very thick bursa, about an 8th of an  inch thick, and there is extensive spurring at the distal clavicle.   PROCEDURE:  Under general anesthesia, the left shoulder was prepared  with DuraPrep and draped as a sterile field sitting on the special  shoulder table.  A 3-inch incision was made over the anterolateral  shoulder.  The dissection was carried down through the subcu to the  deltoid where it was elevated, and electrocautery was used to take  deltoid off the anterolateral acromion.  Once this was elevated, the  bursa was encountered, and a bursectomy was carried out.  The  acromioplasty was done with a 1/2-inch curved osteotome and mallet.  The  fine rasp was used to smooth the undersurface of the acromion  appropriately.  Once this was completed, the margin of the rotator cuff  was sharply debrided back to healthy tissue, and then the greater  tuberosity was planed using a  1/2-inch curved osteotome exposing  cancellus bone.  The concept punches were then used to make two holes in  the greater tuberosity for passage of sutures.  The distal most portion  of the rotator cuff tear was repaired with a side-to-side stitch of #2  Ethicon.  The anterior and posterior leading edges were then pulled down  to bone at the greater tuberosity with #2 FiberWire.  With these in  place, an additional __________  suture of #2 FiberWire is used to close  the gap even further near the greater tuberosity over the cancellus bone  bed.  Once this was completed, the shoulder was taken through range of  motion.  The repair was stable and solid attention was then turned to  the [facility] joint.  It was exposed by subcutaneous dissection.  The fascia  and periosteum of the joint were opened by electrocautery, exposing the  end of the bone.  The periosteum was elevated in a T-shaped manner from  the end of the bone, and Homan retractors were placed anteriorly and  posteriorly.  An  oscillating saw was used to remove 1 cm of bone.  Rongeur was used to trim the rough edges.  Bone wax was applied.  The  periosteum was then repaired with #1 Vicryl.  Attention was then turned  to the rotator cuff where the deltoid was reattached with #2 Ethibond to  the acromion through 2 separate drill  holes.  Fascia was then closed side-to-side at the top with #2 Ethibond,  and the muscle interval distally that extends 3 cm was closed with  Vicryl, subcu with 2-0 Vicryl and skin with clips.  Operating time  approximately 1 hour.  The patient tolerated the procedure well and  returned to recovery in good condition.      John L. Rendall, M.D.  Electronically Signed     JLR/MEDQ  D:  04/10/2006  T:  04/11/2006  Job:  [id number]

## 2010-09-24 NOTE — Consult Note (Signed)
[place]. [facility]  Patient:    [name], [name] Visit Number: [id number] MRN: [id number]          Service Type: Attending:  Pollyann Savoy, M.D. Dictated by:   Pollyann Savoy, M.D. Proc. Date: 04/03/01                            Consultation Report  REFERRING PHYSICIAN:  INCOMPLETE Dictated by:   Pollyann Savoy, M.D. Attending:  Pollyann Savoy, M.D. DD:  04/03/01 TD:  04/04/01 Job: [id number]

## 2010-09-24 NOTE — H&P (Signed)
[name], [name]                      ACCOUNT NO.:  [id number]   MEDICAL RECORD NO.:  [id number]                   PATIENT TYPE:  INP   LOCATION:  NA                                   FACILITY:  [facility]   PHYSICIAN:  John L. Rendall, M.D.               DATE OF BIRTH:  05/04/1956   DATE OF ADMISSION:  DATE OF DISCHARGE:                                HISTORY & PHYSICAL   CHIEF COMPLAINT:  Progressively worsening left knee pain for the last 3 to 4  months.   HISTORY OF PRESENT ILLNESS:  This 55 year old, white female patient  presented to Dr. [name] with a history of a right knee replacement by him  in February of 1998, and a left knee replacement by Dr. [name] in December  of 1998.  She has had about a 3 or 4 month history of gradual onset of  progressively worsening left knee pain.  The pain at this time is described  as pretty much constant for the last 6 weeks and is an aching sensation,  which becomes dull, and throbbing and sharp at times depending upon what she  is doing.  The pain seems to be diffuse around the patella and the knee  joint with some radiation into the proximal tibia.  Pain increases with  weightbearing and decreases with the use of Vicodin, a heating pad, and  rest.  She is currently taking Vicodin EF for pain.  She does ambulate with  one crutch most of the time and occasionally she uses 2 crutches, as she  does not know what kind of service she will be walking on.  The left knee  does lock at times and she has difficulty bending it and then it also  swells.  It also keeps her up at night.   ALLERGIES:  1. ASPIRIN CAUSES GI UPSET.  2. CODEINE CAUSES GI UPSET.  3. IV DYE AND SHELLFISH CAUSES A RASH AND EDEMA.   CURRENT MEDICATIONS:  1. Estradiol 1 mg p.o. q.d.  2. Xanax 0.5 mg 1/2 tablet p.o. t.i.d.  3. Vicodin EF 1 to 2 tablets p.o. q.4h. p.r.n. for pain.  4. Caldesene 0.6 mg one table b.i.d.  5. Lodine 400 mg one tablet p.o. b.i.d.  6.  Quinine sulfate 260 mg one tablet p.o. b.i.d.  7. Prilosec 20 mg one tablet p.o. q.d., which she does not currently take     due to the expense of the medication.  8. Propulsid 20 mg one tablet p.o. q.i.d., which she does not take due to     the expense of the medication at this time.  9. Mylanta 15 to 30 cc p.o. q.4h. p.r.n. indigestion, which she is using in     place of the Prilosec and Propulsid.   PAST MEDICAL HISTORY:  1. Hiatal hernia for the last 29 years.  2. History of peptic ulcer disease for the last 27  years.  No active ulcers     at this time.  3. Inability to use her right hand and arm due to an injury and a cut due to     a plate glass window approximately 11 years ago.  4. Osteoarthritis.  5. Pseudogout.   She denies any history of diabetes mellitus, hypertension, thyroid disease,  heart disease, asthma, or any other chronic medical condition other than  noted previously.    PAST SURGICAL HISTORY:  1. Tonsillectomy in 1972.  2. Open cholecystectomy and appendectomy.  3. In 1979 and 1980, she subsequently underwent 5 left knee arthroscopies by     Dr. [name].  4. During the 1979 and 1980, she underwent 5 right knee arthroscopies by Dr.     [name].  5. 1991 right knee arthroscopy by Dr. [name].  6. Right hand repair by Dr. [name] in February 20, 1991.  7. Partial hysterectomy by Dr. [name] and Dr. [name] in May of 1993.  8. Excision of left ovary by Dr. [name] and Dr. [name] in may of 1994.  9. Left knee arthroscopy November 1997 by Dr. [name].  10.      Right total knee arthroplasty by Dr. [name] June 24, 1996.  11.      Left knee arthroscopy October 28, 1996 by Dr. [name].  12.      She has had two closed reductions of her right shoulder, one in     June of 1998 and one in August of 1998 by Dr. Floyde Parkins.  13.      Right shoulder capsular shift and reconstruction January 22, 1997, by Dr. [name].  14.      Left total knee arthroplasty April 21, 1997 by Dr. Jonny Ruiz L.     Rendall.  15.      IT band tenotomy and excision of bursa of the right hip March 22, 1999 by Dr. [name].  16.      IT band tenotomy and excision of bursa of the left hip on April 21, 1999 by Dr. [name].   SOCIAL HISTORY:  She denies any history of cigarette smoking, alcohol use,  or drug use.  She is married and does not have any children.  She and her  husband live in a one story house with 5 steps into the main entrance.  She  has been disabled since 1998.  Her medical doctor is Dr. [name] and his  phone number is [phone].   FAMILY HISTORY:  Her mother died at the age of 46 with influenza.  She also  had a history of asthma and emphysema.  Her father died at the age of 67  with lung cancer and history of heart disease.  She has one sister age 63,  who is alive and well.   REVIEW OF SYSTEMS:  She wears contact lenses.  She does have a significant  history of reflux and ulcers.  She also has problems with nausea and  vomiting when she is stressed out due to the reflux and hernia.  She has a  history of her right ankle being broken in February of 1998, due to the CTM  after her knee replacement and her left ankle being broken after the left  knee replacement due to  the CTM.  She is currently having problems with  Achilles tendonitis in the right heel and ankle and this is supposed to be  in a Cam walker boot, but she has not been able to do due to the back pain  that it causes.  She does report she has a headache all of the time and it  is kind of dull and she does not take any medicine for it.  She has some  degenerative disc disease in her lumbar spine and she is also complaining of  some pain in her right total knee.  All other systems are negative and  noncontributory.  PHYSICAL EXAMINATION:  GENERAL:  A well-developed, well-nourished,   overweight, white female who walks with a limp and a the use of a cane with  a forearm clamp.  She is accompanied by her husband.  Mood and affect are  appropriate.  She talks easily with the examiner.  Height 5 feet 3 inches,  weight 204 pounds, BMI is 35.  VITAL SIGNS:  Temperature 97.2 degrees Fahrenheit, pulse 64, respirations  16, blood pressure 120/86.  HEENT:  Normocephalic, atraumatic without frontal or maxillary sinus  tenderness to palpation.  Conjunctivae pink.  Sclerae anicteric.  Pupils  equal, round, and reactive to light and accommodations.  Extraocular muscles  are intact.  No visible external ear deformities.  She does have 11 pierced  earrings in each ear.  Tympanic hearing is grossly intact.  Tympanic  membranes pearly gray bilaterally with good light reflects.  Nose and nasal  septum midline.  Nasal mucosa pink and moist without exudates or polyps  noted.  Buccal mucosa pink and moist.  She is missing several teeth and has  multiple fillings.  Pharynx is without erythema or exudate.  Tongue and  uvula are midline.  Tongue is without fasciculations and uvula __________  phonation.  NECK:  No visible masses or lesions noted.  Trachea midline.  No palpable  lymphadenopathy or thyromegaly.  Carotids +2 bilaterally without bruits.  Full range of motion and nontender to palpation along the cervical spine.  CARDIOVASCULAR:  Heart rate and rhythm regular.  S1, S2 present without  rubs, clicks, or murmurs noted.  RESPIRATORY:  Respirations are even and unlabored.  Breath sounds clear to  auscultation bilaterally without rales or wheezes noted.  ABDOMEN:  Noted abdominal contour.  Bowel sounds present times 4 quadrants.  Soft, nontender to palpation without hepatosplenomegaly nor CVA tenderness.  Femoral pulses +2 bilaterally.  She does have some mild discomfort with  palpation over the lower thoracic and lumbar spine.  BREAST/GU/RECTAL/PELVIC:  These exams deferred at this  time.  MUSCULOSKELETAL:  No obvious deformities bilateral upper extremities with  full range of motion in these extremities without pain.  She has a well-  healed right shoulder anterior incision line.  She also has a well-healed  incision line noted over the volar surface of the right wrist.  Radial  pulses are +2 bilaterally and upper extremity strength is 5/5.  She has full range of motion of both hips with flexion to about 90 or 100  degrees, full extension, and easy painless internal and external rotation.  She has full range of motion of bilateral toes and her left ankle.  DP  pulses are +2 bilaterally.  She does have minimal swelling noted about the  right ankle and she has pain with any minimal active or passive range of  motion of the right ankle.  She also  has pain with palpation ove the  insertion of the right Achilles tendon.  The right knee has a well-healed midline incision without erythema or ecchymosis.  There is a minimal effusion at this time.  She has full  extension and flexion to 90 degrees.  There is some pain with range of  motion.  She also complains of pain with palpation on both the medial and  lateral joint line.  She does tighten up when checking varus and valgus  stress, but she feels stable at this time.  Negative anterior drawer.  Left  knee has a well-healed midline incision also with no erythema or ecchymosis.  There is a +1 effusion and she does have a moderate amount of pain with  palpation on both the medial and lateral joint line.  She also has pain with  range of motion and is lacking probably 5 degrees to full extension and can  flex to about 90 degrees.  Again, she tenses with varus and valgus testing,  but she does appear stable to varus and valgus stress. Negative drawer sign.  NEUROLOGIC:  Alert and oriented x3.  Cranial nerves II through XII are  grossly intact.  Strength 5/5 bilateral upper extremities and 4/5 lower  extremities seems to be  attributed to the pain in her knees.  Sensation is  otherwise intact bilaterally with normal muscle tone and bulk.  Deep tendon  reflexes are 2+ bilateral upper extremities, 1+ bilateral patellar and  Achilles reflexes.  Right Achilles reflex not assessed at this time due to  pain.  RADIOLOGIC FINDINGS:  MRI scan of the left knee showed a cyst in the distal  left femur, which is in the posterolateral cortex of the distal left femur  with involvement of the medullary bone.  It is a well circumscribed lesion  and no associated pathologic fracture.  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   IMPRESSION:  1. Painful left total knee with signs of loosening.  2. Painful right total knee.  3. Gastroesophageal reflux disease.  4. Hiatal hernia.  5. Pseudogout.  6. Peptic ulcer disease.  7. Right Achilles tendonitis.  8. Obesity.  9. Nocturnal leg cramps.    PLAN:  The patient will be admitted to [facility] on December 31, 2001, for a revision of the left knee replacement.  She will undergo all of  the routine preoperative laboratory tests and studies prior to this  procedure.  She has been given some samples of Prevacid to try in place of  the Mylanta for her reflux.  We will put her back on the Prilosec and  Propulsid while she is hospitalized.        Legrand Pitts Duffy, P.A.                      John L. [name], M.D.    KED/MEDQ  D:  12/26/2001  T:  12/28/2001  Job:  [id number]

## 2010-09-24 NOTE — Op Note (Signed)
NAMEJACCI, RUBERG                      ACCOUNT NO.:  [id number]   MEDICAL RECORD NO.:  [id number]                   PATIENT TYPE:  INP   LOCATION:  2550                                 FACILITY:  [facility]   PHYSICIAN:  John L. Rendall III, M.D.           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   OPERATION PERFORMED:  Full revision of right total knee using Sigma system.   SURGEON:  John L. Rendall, M.D.   ASSISTANT:  Madilyn Fireman, P.A.-C.   ANESTHESIA:  General.   DESCRIPTION OF PROCEDURE:  Under general anesthesia, the right knee was  prepared with DuraPrep and draped as a sterile field. A sterile thigh  tourniquet was used, the leg was wrapped out with an Esmarch and the  tourniquet was elevated at 350 mmHg.  The previous skin incision was used  and was excised.  Dissection was carried through the previous line of Tycron  sutures, medial parapatellar.  The patella was everted.  Care was taken to  expose the proximal tibia and femur.  The tibial plastic tray was broken  loose by cutting off its stem with an osteotome and mallet.  At this point  the tibial tray was obviously loose and you could wiggle it with an  osteotome under it.  In the area where she  was tender on the femoral  component it was possible to pass a knife blade easily to about 5 to 6 mm  under the anterior flange.  Decision was made to remove the femoral  component.  It was broken loose with an oscillating saw circumferentially  around the whole front of the thing and then flexible osteotomes and finally  it is disimpacted.  No gross cyst formation and no major bone loss was  encountered under it.  I think  there is possible early loosening here with  this component but the majority of it was still tight.  It should be noted  that there was dark colored synovium and thickened synovium associated with  loosening of the tibial tray.  This was all cultured at this point.  Debris  was removed and a superficial clean up cut was made on the tibia to get rid  of most of the bone cement.  The PEG hole was then debrided of bone cement  and now sequentially the tibia and femur are reamed for intramedullary  stems, starting out with 8 going to 10, 11, 12, 13, 14 was a good on the  tibia.  15, 16 was a good fit on the femur.  At this point trial rasping was  done on the tibia for a size  3.  The size 3 stem was placed down the tibia  and fit well.  The femur was sized also at a 3 and after a distal clean up  cut, spacer block was inserted to see proximally what would be needed.  It  was determined that the extension gap was more than the flexion gap and the  distal femoral spacers of 4 mm were planned.  At this point the  intramedullary guide was placed in the femur and distal femoral cut was made  followed by anterior and posterior cuts followed by the Chamfer cuts and the  box cut.  At this point trial components were inserted with the femoral  stem, size 3 component with 4 mm distal and posterior wedges on the femur,  no wedges on the tibia.  A  17.5 bearing gave good balance, flexion and  extension and was quite nice for ligamentous stability with a rotating  bearing.  The patella was then removed, it was recut and three peg holes  placed, trial seating of three peg plastic dome patella with all these  components revealed excellent fit, alignment and stability in flexion and  extension.  Permanent components were then obtained and cemented in place.  Once the cement hardened, the tourniquet was let down at an hour and 45  minutes.  Multiple small vessels  were cauterized but bleeding was not brisk at  all at first.  The wound was  then closed in layers with #1 Tycron, 0 and 2-0 Vicryl and skin clips.  Total operative time was approximately two hours.  The patient tolerated the  procedure well and returned to recovery in good condition.  Femoral nerve  block was given at the end of the case.                                                John L. Dorothyann Gibbs, M.D.    Renato Gails  D:  02/17/2003  T:  02/17/2003  Job:  [id number]

## 2010-09-24 NOTE — Op Note (Signed)
[name], [name]                      ACCOUNT NO.:  [id number]   MEDICAL RECORD NO.:  [id number]                   PATIENT TYPE:  INP   LOCATION:  5027                                 FACILITY:  [facility]   PHYSICIAN:  Mila Homer. Sherlean Foot, M.D.              DATE OF BIRTH:  05-24-1955   DATE OF PROCEDURE:  12/31/2001  DATE OF DISCHARGE:                                 OPERATIVE REPORT   PREOPERATIVE DIAGNOSIS:  Failed left knee total knee arthroplasty.   POSTOPERATIVE DIAGNOSIS:  Failed left knee total knee arthroplasty.   PROCEDURE:  Left total knee revision (both components).   SURGEON:  Mila Homer. Sherlean Foot, M.D.   ASSISTANTS:  Carlisle Beers. [name], M.D., and Jamelle Rushing, P.A.   ANESTHESIA:  General.   COMPLICATIONS:  None.   DRAINS:  Hemovac.   ESTIMATED BLOOD LOSS:  300 cc.   INDICATIONS:  The patient is a white female with CT scan and radiographic  evidence of femoral and tibial component lucency and aseptic loosening.  Workup for infection was negative.  Informed consent was obtained.   DESCRIPTION OF PROCEDURE:  The patient was laid supine, administered general  anesthesia.  The left lower extremity was prepped and draped in the usual  sterile fashion.  Previous incision was used and made with a #10 blade.  A  new 10 blade was used to make a median parapatellar arthrotomy.  The tibial  component was found to be grossly loose as soon as we went into flexion.  We  removed all of the loose debris with a rongeur and then as we went into  flexion with the patella everted, the tibial component could be lifted out  easily with two fingers.  I then removed the cement with a quarter-inch  osteotome, mallet, and rongeurs and curette.  Once we had gotten down to  good bone on the tibia, we then turned our attention to the femur, where we  used a small sagittal saw to develop a plane beneath the press-fit femoral  component and the bone.  We then used revision osteotomes and then  tamped  off the femur.  We did that with very minimal bone loss at all.  There was  some cystic portion directly beneath the component, mainly on the medial  side, but really no cavitary defects at all.  At this point I then reamed  both on the femur and the tibia up to a size 13 on the tibia, a size 16 on  the femur.  We then used the sagittal sizing device on the femur.  We used a  size G and at this point assembled a size E box cutter on a 16 stem and  pinned it into place and cut our box.  We then used the size E trial with a  16 stem and size 4 tibial tray that we drilled and keeled and placed down  with  a size 14 stem.  It went down a little bit difficult with a 14, so we  converted to a 13 and that gave [place] much better purchase distally.  We  trialled with a size 10 and 12 patella.  We, in fact, had excellent flexion-  extension gap balancing.  I did decide to go ahead and complete a cut on the  distal lateral side for a 10 mm augment because there were only a couple of  spicules of bone making contact with the end of the prosthesis.  With this  cut, we had excellent contact on good bone on the medial side.  On the  lateral side I had also good contact as well.  On the tibia I had an area on  the medial side of approximately 4-5 mm of defect, but we had good  circumferential contact all around the tibial tray.  At this point we  removed all of the trials, copiously irrigated, and then cemented in a size  4 femur with a 13 mm straight stem, a size E femur with an offset 16 mm  stem, and a 10 mm distal medial augment.  We also placed on a 32 mm  polyethylene patella, cemented that into place, and then once the cement was  hardened we chose a 12 mm LPS insert to go onto the tibia.  We had excellent  flexion and extension gap balancing.  With the tourniquet down, we  cauterized the bleeding vessels.  I then left a Hemovac deep to the  arthrotomy, closed the arthrotomy with interrupted #1  Vicryl, the deep soft  tissue with interrupted 0 Vicryl, the subcuticular 2-0 Vicryl, and skin  staples closed in flexion.  I dressed with Adaptic, 4 x 4's, sterile Webril,  and a TED stocking.  The patient tolerated the procedure well.                                               Mila Homer. Sherlean Foot, M.D.    SDL/MEDQ  D:  12/31/2001  T:  01/03/2002  Job:  [id number]

## 2011-02-03 ENCOUNTER — Other Ambulatory Visit: Payer: Self-pay | Admitting: Internal Medicine

## 2011-02-03 DIAGNOSIS — Z1231 Encounter for screening mammogram for malignant neoplasm of breast: Secondary | ICD-10-CM

## 2011-02-22 ENCOUNTER — Ambulatory Visit: Payer: PRIVATE HEALTH INSURANCE

## 2011-03-29 ENCOUNTER — Ambulatory Visit: Payer: PRIVATE HEALTH INSURANCE

## 2011-05-12 ENCOUNTER — Ambulatory Visit: Payer: PRIVATE HEALTH INSURANCE

## 2011-06-17 DIAGNOSIS — L920 Granuloma annulare: Secondary | ICD-10-CM | POA: Insufficient documentation

## 2011-06-20 ENCOUNTER — Ambulatory Visit: Payer: PRIVATE HEALTH INSURANCE

## 2011-06-22 ENCOUNTER — Ambulatory Visit: Payer: PRIVATE HEALTH INSURANCE

## 2011-06-22 ENCOUNTER — Ambulatory Visit
Admission: RE | Admit: 2011-06-22 | Discharge: 2011-06-22 | Disposition: A | Discharge status: Home / Self Care | Payer: PRIVATE HEALTH INSURANCE | Source: Ambulatory Visit | Attending: Internal Medicine | Admitting: Internal Medicine

## 2011-06-22 DIAGNOSIS — Z1231 Encounter for screening mammogram for malignant neoplasm of breast: Secondary | ICD-10-CM

## 2012-06-19 ENCOUNTER — Other Ambulatory Visit: Payer: Self-pay | Admitting: Internal Medicine

## 2012-06-19 DIAGNOSIS — Z1231 Encounter for screening mammogram for malignant neoplasm of breast: Secondary | ICD-10-CM

## 2012-07-24 ENCOUNTER — Ambulatory Visit: Payer: PRIVATE HEALTH INSURANCE

## 2012-09-25 ENCOUNTER — Ambulatory Visit
Admission: RE | Admit: 2012-09-25 | Discharge: 2012-09-25 | Disposition: A | Discharge status: Home / Self Care | Payer: Medicare Other | Source: Ambulatory Visit | Attending: Internal Medicine | Admitting: Internal Medicine

## 2012-09-25 DIAGNOSIS — Z1231 Encounter for screening mammogram for malignant neoplasm of breast: Secondary | ICD-10-CM

## 2012-09-26 ENCOUNTER — Other Ambulatory Visit: Payer: Self-pay | Admitting: Internal Medicine

## 2012-09-26 DIAGNOSIS — N644 Mastodynia: Secondary | ICD-10-CM

## 2012-10-09 ENCOUNTER — Other Ambulatory Visit: Payer: Self-pay | Admitting: Neurosurgery

## 2012-10-09 DIAGNOSIS — M47816 Spondylosis without myelopathy or radiculopathy, lumbar region: Secondary | ICD-10-CM

## 2012-10-10 ENCOUNTER — Ambulatory Visit
Admission: RE | Admit: 2012-10-10 | Discharge: 2012-10-10 | Disposition: A | Discharge status: Home / Self Care | Payer: Medicare Other | Source: Ambulatory Visit | Attending: Internal Medicine | Admitting: Internal Medicine

## 2012-10-10 ENCOUNTER — Other Ambulatory Visit: Payer: Self-pay | Admitting: Internal Medicine

## 2012-10-10 DIAGNOSIS — N644 Mastodynia: Secondary | ICD-10-CM

## 2012-10-16 ENCOUNTER — Ambulatory Visit
Admission: RE | Admit: 2012-10-16 | Discharge: 2012-10-16 | Disposition: A | Discharge status: Home / Self Care | Payer: Medicare Other | Source: Ambulatory Visit | Attending: Neurosurgery | Admitting: Neurosurgery

## 2012-10-16 DIAGNOSIS — M47816 Spondylosis without myelopathy or radiculopathy, lumbar region: Secondary | ICD-10-CM

## 2012-11-29 ENCOUNTER — Other Ambulatory Visit: Payer: Self-pay | Admitting: Neurosurgery

## 2012-11-29 DIAGNOSIS — M47816 Spondylosis without myelopathy or radiculopathy, lumbar region: Secondary | ICD-10-CM

## 2012-12-07 ENCOUNTER — Ambulatory Visit
Admission: RE | Admit: 2012-12-07 | Discharge: 2012-12-07 | Disposition: A | Discharge status: Home / Self Care | Payer: Medicare Other | Source: Ambulatory Visit | Attending: Neurosurgery | Admitting: Neurosurgery

## 2012-12-07 VITALS — BP 107/56 | HR 60

## 2012-12-07 DIAGNOSIS — M47816 Spondylosis without myelopathy or radiculopathy, lumbar region: Secondary | ICD-10-CM

## 2012-12-07 MED ORDER — METHYLPREDNISOLONE ACETATE 40 MG/ML INJ SUSP (RADIOLOG
120.0000 mg | Freq: Once | INTRAMUSCULAR | Status: AC
  Administered 2012-12-07: 120 mg via EPIDURAL

## 2012-12-07 MED ORDER — IOHEXOL 180 MG/ML  SOLN
1.0000 mL | Freq: Once | INTRAMUSCULAR | Status: AC | PRN
  Administered 2012-12-07: 1 mL via EPIDURAL

## 2012-12-13 ENCOUNTER — Other Ambulatory Visit: Payer: Medicare Other

## 2012-12-19 ENCOUNTER — Other Ambulatory Visit: Payer: Self-pay | Admitting: Neurosurgery

## 2012-12-19 DIAGNOSIS — M47816 Spondylosis without myelopathy or radiculopathy, lumbar region: Secondary | ICD-10-CM

## 2012-12-21 ENCOUNTER — Other Ambulatory Visit: Payer: Medicare Other

## 2012-12-28 ENCOUNTER — Ambulatory Visit
Admission: RE | Admit: 2012-12-28 | Discharge: 2012-12-28 | Disposition: A | Discharge status: Home / Self Care | Payer: Medicare Other | Source: Ambulatory Visit | Attending: Neurosurgery | Admitting: Neurosurgery

## 2012-12-28 VITALS — BP 85/51 | HR 54

## 2012-12-28 DIAGNOSIS — M5126 Other intervertebral disc displacement, lumbar region: Secondary | ICD-10-CM

## 2012-12-28 DIAGNOSIS — M47816 Spondylosis without myelopathy or radiculopathy, lumbar region: Secondary | ICD-10-CM

## 2012-12-28 MED ORDER — IOHEXOL 180 MG/ML  SOLN
1.0000 mL | Freq: Once | INTRAMUSCULAR | Status: AC | PRN
  Administered 2012-12-28: 1 mL via EPIDURAL

## 2012-12-28 MED ORDER — METHYLPREDNISOLONE ACETATE 40 MG/ML INJ SUSP (RADIOLOG
120.0000 mg | Freq: Once | INTRAMUSCULAR | Status: AC
  Administered 2012-12-28: 120 mg via EPIDURAL

## 2013-01-17 ENCOUNTER — Other Ambulatory Visit: Payer: Self-pay | Admitting: Neurosurgery

## 2013-01-17 DIAGNOSIS — M47816 Spondylosis without myelopathy or radiculopathy, lumbar region: Secondary | ICD-10-CM

## 2013-01-24 ENCOUNTER — Ambulatory Visit
Admission: RE | Admit: 2013-01-24 | Discharge: 2013-01-24 | Disposition: A | Discharge status: Home / Self Care | Payer: Medicare Other | Source: Ambulatory Visit | Attending: Neurosurgery | Admitting: Neurosurgery

## 2013-01-24 VITALS — BP 85/57 | HR 67

## 2013-01-24 DIAGNOSIS — M5126 Other intervertebral disc displacement, lumbar region: Secondary | ICD-10-CM

## 2013-01-24 DIAGNOSIS — M47816 Spondylosis without myelopathy or radiculopathy, lumbar region: Secondary | ICD-10-CM

## 2013-01-24 MED ORDER — IOHEXOL 180 MG/ML  SOLN
1.0000 mL | Freq: Once | INTRAMUSCULAR | Status: AC | PRN
  Administered 2013-01-24: 1 mL via EPIDURAL

## 2013-01-24 MED ORDER — METHYLPREDNISOLONE ACETATE 40 MG/ML INJ SUSP (RADIOLOG
120.0000 mg | Freq: Once | INTRAMUSCULAR | Status: AC
  Administered 2013-01-24: 120 mg via EPIDURAL

## 2013-02-26 ENCOUNTER — Other Ambulatory Visit: Payer: Self-pay | Admitting: Neurosurgery

## 2013-02-26 DIAGNOSIS — Q7649 Other congenital malformations of spine, not associated with scoliosis: Secondary | ICD-10-CM

## 2013-03-05 ENCOUNTER — Inpatient Hospital Stay: Admission: RE | Admit: 2013-03-05 | Payer: Medicare Other | Source: Ambulatory Visit

## 2013-03-05 ENCOUNTER — Other Ambulatory Visit: Payer: Medicare Other

## 2013-03-19 ENCOUNTER — Ambulatory Visit
Admission: RE | Admit: 2013-03-19 | Discharge: 2013-03-19 | Disposition: A | Discharge status: Home / Self Care | Payer: Medicare Other | Source: Ambulatory Visit | Attending: Neurosurgery | Admitting: Neurosurgery

## 2013-03-19 ENCOUNTER — Other Ambulatory Visit: Payer: Self-pay | Admitting: Neurosurgery

## 2013-03-19 DIAGNOSIS — Q7649 Other congenital malformations of spine, not associated with scoliosis: Secondary | ICD-10-CM

## 2013-05-13 ENCOUNTER — Other Ambulatory Visit: Payer: Self-pay | Admitting: Neurosurgery

## 2013-05-13 DIAGNOSIS — M47816 Spondylosis without myelopathy or radiculopathy, lumbar region: Secondary | ICD-10-CM

## 2013-06-14 ENCOUNTER — Ambulatory Visit
Admission: RE | Admit: 2013-06-14 | Discharge: 2013-06-14 | Disposition: A | Discharge status: Home / Self Care | Payer: Medicare Other | Source: Ambulatory Visit | Attending: Neurosurgery | Admitting: Neurosurgery

## 2013-06-14 VITALS — BP 129/52 | HR 66

## 2013-06-14 DIAGNOSIS — M47816 Spondylosis without myelopathy or radiculopathy, lumbar region: Secondary | ICD-10-CM

## 2013-06-14 MED ORDER — METHYLPREDNISOLONE ACETATE 40 MG/ML INJ SUSP (RADIOLOG
120.0000 mg | Freq: Once | INTRAMUSCULAR | Status: AC
  Administered 2013-06-14: 120 mg via EPIDURAL

## 2013-06-14 MED ORDER — IOHEXOL 180 MG/ML  SOLN
1.0000 mL | Freq: Once | INTRAMUSCULAR | Status: AC | PRN
  Administered 2013-06-14: 1 mL via EPIDURAL

## 2013-06-14 NOTE — Discharge Instructions (Signed)

## 2013-07-11 ENCOUNTER — Other Ambulatory Visit: Payer: Self-pay | Admitting: Neurosurgery

## 2013-07-11 DIAGNOSIS — M47816 Spondylosis without myelopathy or radiculopathy, lumbar region: Secondary | ICD-10-CM

## 2013-07-19 ENCOUNTER — Ambulatory Visit
Admission: RE | Admit: 2013-07-19 | Discharge: 2013-07-19 | Disposition: A | Discharge status: Home / Self Care | Payer: Medicare Other | Source: Ambulatory Visit | Attending: Neurosurgery | Admitting: Neurosurgery

## 2013-07-19 DIAGNOSIS — M47816 Spondylosis without myelopathy or radiculopathy, lumbar region: Secondary | ICD-10-CM

## 2013-07-19 MED ORDER — IOHEXOL 180 MG/ML  SOLN
1.0000 mL | Freq: Once | INTRAMUSCULAR | Status: AC | PRN
  Administered 2013-07-19: 1 mL via EPIDURAL

## 2013-07-19 MED ORDER — TRIAMCINOLONE ACETONIDE 40 MG/ML IJ SUSP (RADIOLOGY)
60.0000 mg | Freq: Once | INTRAMUSCULAR | Status: AC
  Administered 2013-07-19: 60 mg via EPIDURAL

## 2013-10-29 ENCOUNTER — Other Ambulatory Visit: Payer: Self-pay

## 2013-10-29 DIAGNOSIS — Z1231 Encounter for screening mammogram for malignant neoplasm of breast: Secondary | ICD-10-CM

## 2013-11-21 ENCOUNTER — Ambulatory Visit: Payer: Medicare Other

## 2014-01-01 ENCOUNTER — Ambulatory Visit: Payer: Medicare Other

## 2014-01-14 ENCOUNTER — Ambulatory Visit
Admission: RE | Admit: 2014-01-14 | Discharge: 2014-01-14 | Disposition: A | Discharge status: Home / Self Care | Payer: Medicare Other | Source: Ambulatory Visit

## 2014-01-14 DIAGNOSIS — Z1231 Encounter for screening mammogram for malignant neoplasm of breast: Secondary | ICD-10-CM

## 2015-02-10 ENCOUNTER — Other Ambulatory Visit: Payer: Self-pay | Admitting: Internal Medicine

## 2015-02-10 ENCOUNTER — Other Ambulatory Visit: Payer: Self-pay

## 2015-02-10 DIAGNOSIS — Z1231 Encounter for screening mammogram for malignant neoplasm of breast: Secondary | ICD-10-CM

## 2015-02-23 ENCOUNTER — Other Ambulatory Visit: Payer: Self-pay | Admitting: Neurosurgery

## 2015-02-23 DIAGNOSIS — M47817 Spondylosis without myelopathy or radiculopathy, lumbosacral region: Secondary | ICD-10-CM

## 2015-02-26 ENCOUNTER — Other Ambulatory Visit: Payer: Self-pay

## 2015-03-04 ENCOUNTER — Ambulatory Visit
Admission: RE | Admit: 2015-03-04 | Discharge: 2015-03-04 | Disposition: A | Discharge status: Home / Self Care | Payer: Medicare Other | Source: Ambulatory Visit | Attending: Neurosurgery | Admitting: Neurosurgery

## 2015-03-04 ENCOUNTER — Other Ambulatory Visit: Payer: Self-pay | Admitting: Neurosurgery

## 2015-03-04 DIAGNOSIS — M47817 Spondylosis without myelopathy or radiculopathy, lumbosacral region: Secondary | ICD-10-CM

## 2015-03-04 MED ORDER — METHYLPREDNISOLONE ACETATE 40 MG/ML INJ SUSP (RADIOLOG
120.0000 mg | Freq: Once | INTRAMUSCULAR | Status: AC
  Administered 2015-03-04: 120 mg via EPIDURAL

## 2015-03-04 MED ORDER — IOHEXOL 180 MG/ML  SOLN
1.0000 mL | Freq: Once | INTRAMUSCULAR | Status: DC | PRN
  Administered 2015-03-04: 1 mL via EPIDURAL

## 2015-03-04 NOTE — Discharge Instructions (Signed)

## 2015-03-05 ENCOUNTER — Ambulatory Visit: Payer: Self-pay

## 2015-03-09 ENCOUNTER — Other Ambulatory Visit: Payer: Self-pay | Admitting: Neurosurgery

## 2015-03-09 DIAGNOSIS — M47817 Spondylosis without myelopathy or radiculopathy, lumbosacral region: Secondary | ICD-10-CM

## 2015-03-19 ENCOUNTER — Other Ambulatory Visit: Payer: Self-pay | Admitting: Neurosurgery

## 2015-03-19 ENCOUNTER — Ambulatory Visit
Admission: RE | Admit: 2015-03-19 | Discharge: 2015-03-19 | Disposition: A | Discharge status: Home / Self Care | Payer: Medicare Other | Source: Ambulatory Visit | Attending: Neurosurgery | Admitting: Neurosurgery

## 2015-03-19 DIAGNOSIS — M47817 Spondylosis without myelopathy or radiculopathy, lumbosacral region: Secondary | ICD-10-CM

## 2015-03-19 MED ORDER — METHYLPREDNISOLONE ACETATE 40 MG/ML INJ SUSP (RADIOLOG
120.0000 mg | Freq: Once | INTRAMUSCULAR | Status: AC
  Administered 2015-03-19: 120 mg via EPIDURAL

## 2015-03-19 MED ORDER — IOHEXOL 180 MG/ML  SOLN
1.0000 mL | Freq: Once | INTRAMUSCULAR | Status: DC | PRN
  Administered 2015-03-19: 1 mL via INTRAVENOUS

## 2015-03-20 ENCOUNTER — Other Ambulatory Visit: Payer: Medicare Other

## 2015-03-27 ENCOUNTER — Other Ambulatory Visit: Payer: Self-pay | Admitting: Neurosurgery

## 2015-03-27 DIAGNOSIS — M5412 Radiculopathy, cervical region: Secondary | ICD-10-CM

## 2015-04-06 ENCOUNTER — Ambulatory Visit
Admission: RE | Admit: 2015-04-06 | Discharge: 2015-04-06 | Disposition: A | Discharge status: Home / Self Care | Payer: Self-pay | Source: Ambulatory Visit

## 2015-04-06 DIAGNOSIS — Z1231 Encounter for screening mammogram for malignant neoplasm of breast: Secondary | ICD-10-CM

## 2015-04-07 ENCOUNTER — Ambulatory Visit
Admission: RE | Admit: 2015-04-07 | Discharge: 2015-04-07 | Disposition: A | Discharge status: Home / Self Care | Payer: Medicare Other | Source: Ambulatory Visit | Attending: Neurosurgery | Admitting: Neurosurgery

## 2015-04-07 DIAGNOSIS — M5412 Radiculopathy, cervical region: Secondary | ICD-10-CM

## 2015-04-07 MED ORDER — IOHEXOL 300 MG/ML  SOLN
1.0000 mL | Freq: Once | INTRAMUSCULAR | Status: AC | PRN
  Administered 2015-04-07: 1 mL via EPIDURAL

## 2015-04-07 MED ORDER — TRIAMCINOLONE ACETONIDE 40 MG/ML IJ SUSP (RADIOLOGY)
60.0000 mg | Freq: Once | INTRAMUSCULAR | Status: AC
  Administered 2015-04-07: 60 mg via EPIDURAL

## 2015-04-07 NOTE — Discharge Instructions (Signed)

## 2016-05-11 ENCOUNTER — Other Ambulatory Visit: Payer: Self-pay | Admitting: Internal Medicine

## 2016-05-11 DIAGNOSIS — Z1231 Encounter for screening mammogram for malignant neoplasm of breast: Secondary | ICD-10-CM

## 2016-06-07 ENCOUNTER — Ambulatory Visit
Admission: RE | Admit: 2016-06-07 | Discharge: 2016-06-07 | Disposition: A | Discharge status: Home / Self Care | Payer: Medicare Other | Source: Ambulatory Visit | Attending: Internal Medicine | Admitting: Internal Medicine

## 2016-06-07 DIAGNOSIS — Z1231 Encounter for screening mammogram for malignant neoplasm of breast: Secondary | ICD-10-CM

## 2016-09-02 ENCOUNTER — Encounter: Payer: Self-pay | Admitting: Sports Medicine

## 2016-09-02 ENCOUNTER — Ambulatory Visit (INDEPENDENT_AMBULATORY_CARE_PROVIDER_SITE_OTHER): Payer: Medicare Other | Admitting: Sports Medicine

## 2016-09-02 DIAGNOSIS — M79674 Pain in right toe(s): Secondary | ICD-10-CM | POA: Diagnosis not present

## 2016-09-02 DIAGNOSIS — L600 Ingrowing nail: Secondary | ICD-10-CM | POA: Diagnosis not present

## 2016-09-02 NOTE — Patient Instructions (Signed)

## 2016-09-02 NOTE — Progress Notes (Signed)
Subjective: [name] is a 61 y.o. female patient presents to office today complaining of a painful incurvated, red, hot, swollen lateral nail border of the 1st toe on the Right foot. This has been present for 6 weeks. Patient has treated this by self trimming. Patient denies fever/chills/nausea/vomitting/any other related constitutional symptoms at this time.  There are no active problems to display for this patient.   No current outpatient prescriptions on file prior to visit.   No current facility-administered medications on file prior to visit.     Allergies  Allergen Reactions  . Oxybutynin   . Aspirin Nausea Only    Objective:  There were no vitals filed for this visit.  General: Well developed, nourished, in no acute distress, alert and oriented x3   Dermatology: Skin is warm, dry and supple bilateral. Right hallux nail appears to be  severely incurvated with hyperkeratosis formation at the distal aspects of  the lateral nail border. (+) Erythema. (+) Edema. (-) serosanguous  drainage present. The remaining nails appear unremarkable at this time. There are no open sores, lesions or other signs of infection present.  Vascular: Dorsalis Pedis artery and Posterior Tibial artery pedal pulses are 2/4 bilateral with immedate capillary fill time. Pedal hair growth present. No lower extremity edema.   Neruologic: Grossly intact via light touch bilateral.  Musculoskeletal: Tenderness to palpation of the Right hallux lateral nail fold. Muscular strength within normal limits in all groups bilateral.   Assesement and Plan: Problem List Items Addressed This Visit    None    Visit Diagnoses    Ingrown nail    -  Primary   Toe pain, right          -Discussed treatment alternatives and plan of care; Explained permanent/temporary nail avulsion and post procedure course to patient. - After a verbal consent, injected 3 ml of a 50:50 mixture of 2% plain  lidocaine and 0.5%  plain marcaine in a normal hallux block fashion. Next, a  betadine prep was performed. Anesthesia was tested and found to be appropriate.  The offending right hallux lateral nail border was then incised from the hyponychium to the epinychium. The offending nail border was removed and cleared from the field. The area was curretted for any remaining nail or spicules. Phenol application performed and the area was then flushed with alcohol and dressed with antibiotic cream and a dry sterile dressing. -Patient was instructed to leave the dressing intact for today and begin soaking  in a weak solution of betadine or Epsom salt and water tomorrow. Patient was instructed to soak for 15 minutes each day and apply neosporin and a gauze or bandaid dressing each day. -Patient was instructed to monitor the toe for signs of infection and return to office if toe becomes red, hot or swollen. -Advised ice, elevation, and tylenol or motrin if needed for pain.  -Patient is to return in 2 weeks for follow up care/nail check or sooner if problems arise.  Asencion Islam, DPM

## 2016-09-16 ENCOUNTER — Ambulatory Visit: Payer: Medicare Other | Admitting: Sports Medicine

## 2016-10-20 ENCOUNTER — Ambulatory Visit: Payer: Medicare Other | Admitting: Sports Medicine

## 2016-10-31 ENCOUNTER — Ambulatory Visit
Admit: 2016-10-31 | Discharge: 2016-10-31 | Payer: TRICARE (CHAMPUS) | Attending: Women's Health | Primary: Internal Medicine

## 2016-10-31 DIAGNOSIS — N898 Other specified noninflammatory disorders of vagina: Secondary | ICD-10-CM

## 2016-10-31 NOTE — Progress Notes (Signed)
Gynecologic Problem History and Physical    Subjective:     Shannon Zimmerman is a 61 y.o. 394P4 female who presents with complaints of vaginal itching and dryness. Reports that vagina bleeds when she wipes and is unable to have intercourse. Also complains of vaginal odor. Reports menopause 10 years ago. [facility]. Was prescribed estrogen gel by another provider, but didn't take it because she is on Tamoxifen. She does not have cancer, but is at high risk due to family history. Denies history of abnormal pap smear and has had normal mammograms.     Allergies   Allergen Reactions   ??? Nut - Unspecified Swelling   ??? Shellfish Derived Other (comments)     WATER POCKETS     Prior to Admission medications    Medication Sig Start Date End Date Taking? Authorizing Provider   fluconazole (DIFLUCAN) 150 mg tablet Take 1 Tab by mouth daily for 1 day. FDA advises cautious prescribing of oral fluconazole in pregnancy. 11/03/16 11/04/16 Yes Buel ReamAndrea Zahara Rembert, NP   metoprolol succinate (TOPROL-XL) 200 mg XL tablet Take 200 mg by mouth daily.   Yes Historical Provider   valsartan (DIOVAN) 160 mg tablet Take  by mouth daily.   Yes Historical Provider   ezetimibe (ZETIA) 10 mg tablet Take  by mouth.   Yes Historical Provider   prasugrel (EFFIENT) 10 mg tablet Take  by mouth.   Yes Historical Provider   gabapentin (NEURONTIN) 300 mg capsule Take 300 mg by mouth three (3) times daily.   Yes Historical Provider   metFORMIN (GLUCOPHAGE) 500 mg tablet Take  by mouth two (2) times daily (with meals).   Yes Historical Provider   cetirizine (ZYRTEC) 5 mg/5 mL solution Take  by mouth.   Yes Historical Provider   pravastatin (PRAVACHOL) 80 mg tablet Take 80 mg by mouth nightly.   Yes Historical Provider   montelukast (SINGULAIR) 4 mg Take  by mouth every evening.   Yes Historical Provider   nitroglycerin (NITROSTAT SL) by SubLINGual route.   Yes Historical Provider   traZODone (DESYREL) 100 mg tablet Take 100 mg by mouth nightly.   Yes  Historical Provider   cholecalciferol, VITAMIN D3, (VITAMIN D3) 5,000 unit tab tablet Take  by mouth daily.   Yes Historical Provider   aspirin delayed-release 81 mg tablet Take  by mouth daily.   Yes Historical Provider   insulin aspart prot/insuln asp (NOVOLOG MIX 70-30 SC) by SubCUTAneous route.   Yes Historical Provider   insulin glargine,hum.rec.anlog (LANTUS SC) by SubCUTAneous route.   Yes Historical Provider   exenatide microspheres (BYDUREON SC) by SubCUTAneous route.   Yes Historical Provider     Past Medical History:   Diagnosis Date   ??? Arthritis    ??? Diabetes (HCC)    ??? Heart attack [facility](HCC) 2016 /2017   ??? Hypertension       Past Surgical History:   Procedure Laterality Date   ??? HX CHOLECYSTECTOMY  1991   ??? HX OTHER SURGICAL      TUMMY TUCK      Family History   Problem Relation Age of Onset   ??? Heart Attack Mother    ??? Cancer Father      LUNG   ??? Breast Cancer Sister       Social History   Substance Use Topics   ??? Smoking status: Never Smoker   ??? Smokeless tobacco: Never Used   ??? Alcohol use No  Social History     Social History Narrative   ??? No narrative on file     Focused ROS:  Gynecologic:   Postmenopause. Reports vaginal bleeding with wiping and intercourse (very rare).   Dysmenorrhea: none  PMS: none  Denies vaginal or vulvar discharge. Positive for itching and odor.   Denies vaginal, pelvic, or abdominal pain.  Denies menopausal symptoms of hot flashes, night sweats  General: good mood   G.I.: denies nausea, vomiting, diarrhea, and constipation. BM every 1 to 2 days.  Urinary: denies dysuria, urgency, frequency, hematuria or incontinence.    Objective:     Visit Vitals   ??? BP 129/83   ??? Pulse 92   ??? Wt 180 lb (81.6 kg)       Focused PE:  General: WNWD female in NAD  Pelvic exam:  External female genitalia: signs of atrophy; normal pigmentation  BUS: no mass, nontender  Urethral Meatus: normal in appearance.  Urethra: well suspended without mass.   Vagina: atrophic, no discharge, tissues pale and thin. No visible blood, no lesions.  Cervix: no CMT or discharge  Uterus: nl size, nontender, mobile, anteverted  Adnexa: right: NT and without mass; left: NT and without mass    Data Review:   Results for orders placed or performed in visit on 10/31/16   AMB POC SMEAR, STAIN & INTERPRET, WET MOUNT   Result Value Ref Range    Wet mount (POC) abn     Impression    KOH Results:    Hypae: moderate  Buds: negative    Wet Prep Results:  Trich: negative  Clue cells: negative  WBC's: normal  PH: 5.5             Assessment/Plan:     1. Vagina itching    2. Vaginal atrophy    3. Vaginal dryness    4. Vaginal spotting    5. Yeast vaginitis        Orders Placed This Encounter   ??? AMB POC [place], TRANSVAGINAL     Standing Status:   Future     Standing Expiration Date:   12/31/2016     Order Specific Question:   Reason for Exam     Answer:   PMB, dry vaginal tissues     Order Specific Question:   Is Patient Allergic to Contrast Dye?     Answer:   No   ??? AMB POC SMEAR, STAIN & INTERPRET, WET MOUNT   ??? fluconazole (DIFLUCAN) 150 mg tablet     Sig: Take 1 Tab by mouth daily for 1 day. FDA advises cautious prescribing of oral fluconazole in pregnancy.     Dispense:  1 Tab     Refill:  0     TVUS ordered.   RX Diflucan to treat yeast symptoms.   Avoid hormonal treatment for now until uterine bleeding ruled out.    Recommended vaginal moisturizers/lubricants to improve comfort.     Follow-up Disposition:  Return if symptoms worsen or fail to improve, for U/S.    Buel Ream, NP

## 2016-10-31 NOTE — Progress Notes (Signed)
I have reviewed the notes, assessments, and/or procedures performed by Buel ReamAndrea Nelson, I concur with her/his documentation of Enrigue CatenaDebra J Lomas.

## 2016-11-03 ENCOUNTER — Encounter

## 2016-11-03 LAB — AMB POC SMEAR, STAIN & INTERPRET, WET MOUNT

## 2016-11-03 MED ORDER — FLUCONAZOLE 150 MG TAB
150 mg | ORAL_TABLET | ORAL | 0 refills | Status: AC
Start: 2016-11-03 — End: ?

## 2016-11-03 MED ORDER — FLUCONAZOLE 150 MG TAB
150 mg | ORAL_TABLET | Freq: Every day | ORAL | 0 refills | Status: DC
Start: 2016-11-03 — End: 2016-11-03

## 2016-11-04 ENCOUNTER — Encounter

## 2016-11-14 ENCOUNTER — Ambulatory Visit: Admit: 2016-11-14 | Discharge: 2016-11-14 | Payer: TRICARE (CHAMPUS) | Primary: Internal Medicine

## 2016-11-14 DIAGNOSIS — N950 Postmenopausal bleeding: Secondary | ICD-10-CM

## 2016-11-14 NOTE — Progress Notes (Signed)
Ultrasound reviewed.  Uterus and ovaries appear normal.  TVS and limited ABD US were performed.  Endometrial stripe 3.5 mm.

## 2016-11-16 ENCOUNTER — Emergency Department (HOSPITAL_COMMUNITY): Payer: Medicare Other

## 2016-11-16 ENCOUNTER — Encounter (HOSPITAL_COMMUNITY): Payer: Self-pay | Admitting: Emergency Medicine

## 2016-11-16 ENCOUNTER — Observation Stay (HOSPITAL_COMMUNITY)
Admission: EM | Admit: 2016-11-16 | Discharge: 2016-11-18 | Disposition: A | Discharge status: Home / Self Care | Payer: Medicare Other | Attending: Cardiology | Admitting: Cardiology

## 2016-11-16 DIAGNOSIS — M1990 Unspecified osteoarthritis, unspecified site: Secondary | ICD-10-CM | POA: Diagnosis not present

## 2016-11-16 DIAGNOSIS — G894 Chronic pain syndrome: Secondary | ICD-10-CM | POA: Diagnosis not present

## 2016-11-16 DIAGNOSIS — M545 Low back pain: Secondary | ICD-10-CM | POA: Insufficient documentation

## 2016-11-16 DIAGNOSIS — E781 Pure hyperglyceridemia: Secondary | ICD-10-CM | POA: Insufficient documentation

## 2016-11-16 DIAGNOSIS — F329 Major depressive disorder, single episode, unspecified: Secondary | ICD-10-CM | POA: Diagnosis not present

## 2016-11-16 DIAGNOSIS — I129 Hypertensive chronic kidney disease with stage 1 through stage 4 chronic kidney disease, or unspecified chronic kidney disease: Secondary | ICD-10-CM | POA: Diagnosis not present

## 2016-11-16 DIAGNOSIS — R079 Chest pain, unspecified: Secondary | ICD-10-CM

## 2016-11-16 DIAGNOSIS — I2510 Atherosclerotic heart disease of native coronary artery without angina pectoris: Secondary | ICD-10-CM | POA: Insufficient documentation

## 2016-11-16 DIAGNOSIS — I25119 Atherosclerotic heart disease of native coronary artery with unspecified angina pectoris: Secondary | ICD-10-CM

## 2016-11-16 DIAGNOSIS — E876 Hypokalemia: Secondary | ICD-10-CM | POA: Insufficient documentation

## 2016-11-16 DIAGNOSIS — R9431 Abnormal electrocardiogram [ECG] [EKG]: Secondary | ICD-10-CM

## 2016-11-16 DIAGNOSIS — F419 Anxiety disorder, unspecified: Secondary | ICD-10-CM | POA: Diagnosis not present

## 2016-11-16 DIAGNOSIS — M797 Fibromyalgia: Secondary | ICD-10-CM | POA: Diagnosis not present

## 2016-11-16 DIAGNOSIS — Z6834 Body mass index (BMI) 34.0-34.9, adult: Secondary | ICD-10-CM | POA: Diagnosis not present

## 2016-11-16 DIAGNOSIS — G43909 Migraine, unspecified, not intractable, without status migrainosus: Secondary | ICD-10-CM | POA: Diagnosis not present

## 2016-11-16 DIAGNOSIS — K219 Gastro-esophageal reflux disease without esophagitis: Secondary | ICD-10-CM | POA: Diagnosis not present

## 2016-11-16 DIAGNOSIS — E669 Obesity, unspecified: Secondary | ICD-10-CM | POA: Diagnosis not present

## 2016-11-16 DIAGNOSIS — Z8719 Personal history of other diseases of the digestive system: Secondary | ICD-10-CM | POA: Insufficient documentation

## 2016-11-16 DIAGNOSIS — D649 Anemia, unspecified: Secondary | ICD-10-CM | POA: Insufficient documentation

## 2016-11-16 DIAGNOSIS — R001 Bradycardia, unspecified: Secondary | ICD-10-CM | POA: Diagnosis not present

## 2016-11-16 DIAGNOSIS — Z8249 Family history of ischemic heart disease and other diseases of the circulatory system: Secondary | ICD-10-CM | POA: Insufficient documentation

## 2016-11-16 DIAGNOSIS — N183 Chronic kidney disease, stage 3 (moderate): Secondary | ICD-10-CM | POA: Insufficient documentation

## 2016-11-16 DIAGNOSIS — E7800 Pure hypercholesterolemia, unspecified: Secondary | ICD-10-CM | POA: Insufficient documentation

## 2016-11-16 DIAGNOSIS — K449 Diaphragmatic hernia without obstruction or gangrene: Secondary | ICD-10-CM | POA: Diagnosis not present

## 2016-11-16 DIAGNOSIS — I2584 Coronary atherosclerosis due to calcified coronary lesion: Secondary | ICD-10-CM | POA: Diagnosis not present

## 2016-11-16 DIAGNOSIS — I672 Cerebral atherosclerosis: Secondary | ICD-10-CM | POA: Diagnosis not present

## 2016-11-16 DIAGNOSIS — R9439 Abnormal result of other cardiovascular function study: Secondary | ICD-10-CM

## 2016-11-16 DIAGNOSIS — I25811 Atherosclerosis of native coronary artery of transplanted heart without angina pectoris: Secondary | ICD-10-CM | POA: Diagnosis not present

## 2016-11-16 DIAGNOSIS — R42 Dizziness and giddiness: Secondary | ICD-10-CM | POA: Insufficient documentation

## 2016-11-16 HISTORY — DX: Gastro-esophageal reflux disease without esophagitis: K21.9

## 2016-11-16 HISTORY — DX: Phlebitis and thrombophlebitis of lower extremities, unspecified: I80.3

## 2016-11-16 HISTORY — DX: Low back pain, unspecified: M54.50

## 2016-11-16 HISTORY — DX: Fibromyalgia: M79.7

## 2016-11-16 HISTORY — DX: Unspecified osteoarthritis, unspecified site: M19.90

## 2016-11-16 HISTORY — DX: Migraine, unspecified, not intractable, without status migrainosus: G43.909

## 2016-11-16 HISTORY — DX: Personal history of other medical treatment: Z92.89

## 2016-11-16 HISTORY — DX: Personal history of other diseases of the digestive system: Z87.19

## 2016-11-16 HISTORY — DX: Personal history of peptic ulcer disease: Z87.11

## 2016-11-16 HISTORY — DX: Pure hypercholesterolemia, unspecified: E78.00

## 2016-11-16 HISTORY — DX: Essential (primary) hypertension: I10

## 2016-11-16 HISTORY — DX: Anemia, unspecified: D64.9

## 2016-11-16 HISTORY — DX: Other chronic pain: G89.29

## 2016-11-16 HISTORY — DX: Headache, unspecified: R51.9

## 2016-11-16 HISTORY — DX: Low back pain: M54.5

## 2016-11-16 HISTORY — DX: Chronic kidney disease, stage 3 (moderate): N18.3

## 2016-11-16 HISTORY — DX: Headache: R51

## 2016-11-16 HISTORY — DX: Chronic kidney disease, stage 3 unspecified: N18.30

## 2016-11-16 LAB — CBC WITH DIFFERENTIAL/PLATELET
BASOS PCT: 1 %
Basophils Absolute: 0.1 10*3/uL (ref 0.0–0.1)
EOS ABS: 0.2 10*3/uL (ref 0.0–0.7)
Eosinophils Relative: 3 %
HCT: 31.6 % — ABNORMAL LOW (ref 36.0–46.0)
HEMOGLOBIN: 9.7 g/dL — AB (ref 12.0–15.0)
Lymphocytes Relative: 23 %
Lymphs Abs: 1.3 10*3/uL (ref 0.7–4.0)
MCH: 23.2 pg — ABNORMAL LOW (ref 26.0–34.0)
MCHC: 30.7 g/dL (ref 30.0–36.0)
MCV: 75.6 fL — ABNORMAL LOW (ref 78.0–100.0)
Monocytes Absolute: 0.4 10*3/uL (ref 0.1–1.0)
Monocytes Relative: 6 %
NEUTROS PCT: 67 %
Neutro Abs: 3.7 10*3/uL (ref 1.7–7.7)
Platelets: 207 10*3/uL (ref 150–400)
RBC: 4.18 MIL/uL (ref 3.87–5.11)
RDW: 16.8 % — AB (ref 11.5–15.5)
WBC: 5.6 10*3/uL (ref 4.0–10.5)

## 2016-11-16 LAB — CBC
HCT: 31.6 % — ABNORMAL LOW (ref 36.0–46.0)
Hemoglobin: 9.8 g/dL — ABNORMAL LOW (ref 12.0–15.0)
MCH: 23.1 pg — ABNORMAL LOW (ref 26.0–34.0)
MCHC: 31 g/dL (ref 30.0–36.0)
MCV: 74.4 fL — ABNORMAL LOW (ref 78.0–100.0)
Platelets: 177 K/uL (ref 150–400)
RBC: 4.25 MIL/uL (ref 3.87–5.11)
RDW: 16.3 % — ABNORMAL HIGH (ref 11.5–15.5)
WBC: 3.8 K/uL — ABNORMAL LOW (ref 4.0–10.5)

## 2016-11-16 LAB — COMPREHENSIVE METABOLIC PANEL
ALBUMIN: 3.5 g/dL (ref 3.5–5.0)
ALK PHOS: 76 U/L (ref 38–126)
ALT: 10 U/L — ABNORMAL LOW (ref 14–54)
ANION GAP: 9 (ref 5–15)
AST: 19 U/L (ref 15–41)
BUN: 20 mg/dL (ref 6–20)
CO2: 21 mmol/L — AB (ref 22–32)
Calcium: 8.8 mg/dL — ABNORMAL LOW (ref 8.9–10.3)
Chloride: 104 mmol/L (ref 101–111)
Creatinine, Ser: 1.04 mg/dL — ABNORMAL HIGH (ref 0.44–1.00)
GFR calc Af Amer: >60 mL/min (ref >60)
GFR calc non Af Amer: 56 mL/min — ABNORMAL LOW (ref >60)
GLUCOSE: 89 mg/dL (ref 65–99)
POTASSIUM: 3.5 mmol/L (ref 3.5–5.1)
SODIUM: 134 mmol/L — AB (ref 135–145)
Total Bilirubin: 0.5 mg/dL (ref 0.3–1.2)
Total Protein: 6.5 g/dL (ref 6.5–8.1)

## 2016-11-16 LAB — I-STAT TROPONIN, ED: Troponin i, poc: 0 ng/mL (ref 0.00–0.08)

## 2016-11-16 LAB — CREATININE, SERUM
Creatinine, Ser: 0.99 mg/dL (ref 0.44–1.00)
GFR calc Af Amer: >60 mL/min (ref >60)
GFR calc non Af Amer: 60 mL/min — ABNORMAL LOW (ref >60)

## 2016-11-16 LAB — MAGNESIUM: MAGNESIUM: 2.1 mg/dL (ref 1.7–2.4)

## 2016-11-16 LAB — TROPONIN I: Troponin I: <0.03 ng/mL (ref <0.03)

## 2016-11-16 LAB — PHOSPHORUS: PHOSPHORUS: 2.9 mg/dL (ref 2.5–4.6)

## 2016-11-16 MED ORDER — ENSURE ENLIVE PO LIQD: 237.0000 mL | Freq: Two times a day (BID) | ORAL | Status: DC

## 2016-11-16 MED ORDER — ALPRAZOLAM 0.5 MG PO TABS
1.0000 mg | ORAL_TABLET | Freq: Three times a day (TID) | ORAL | Status: DC | PRN
  Administered 2016-11-16 – 2016-11-18 (×4): 1 mg via ORAL
  Filled 2016-11-16 (×2): qty 2
  Filled 2016-11-16: qty 4
  Filled 2016-11-16: qty 2

## 2016-11-16 MED ORDER — ASPIRIN EC 81 MG PO TBEC
81.0000 mg | DELAYED_RELEASE_TABLET | Freq: Every day | ORAL | Status: DC
  Filled 2016-11-16: qty 1

## 2016-11-16 MED ORDER — FLUOXETINE HCL 20 MG PO CAPS
20.0000 mg | ORAL_CAPSULE | Freq: Every day | ORAL | Status: DC
  Administered 2016-11-16 – 2016-11-17 (×2): 20 mg via ORAL
  Filled 2016-11-16 (×2): qty 1

## 2016-11-16 MED ORDER — ACETAMINOPHEN 325 MG PO TABS: 650.0000 mg | ORAL_TABLET | ORAL | Status: DC | PRN

## 2016-11-16 MED ORDER — NITROGLYCERIN 0.4 MG SL SUBL: 0.4000 mg | SUBLINGUAL_TABLET | SUBLINGUAL | Status: DC | PRN

## 2016-11-16 MED ORDER — GABAPENTIN 100 MG PO CAPS
100.0000 mg | ORAL_CAPSULE | Freq: Three times a day (TID) | ORAL | Status: DC
  Administered 2016-11-16 – 2016-11-18 (×5): 100 mg via ORAL
  Filled 2016-11-16 (×5): qty 1

## 2016-11-16 MED ORDER — HEPARIN SODIUM (PORCINE) 5000 UNIT/ML IJ SOLN
5000.0000 [IU] | Freq: Three times a day (TID) | INTRAMUSCULAR | Status: DC
  Filled 2016-11-16: qty 1

## 2016-11-16 MED ORDER — PANTOPRAZOLE SODIUM 40 MG PO TBEC
40.0000 mg | DELAYED_RELEASE_TABLET | Freq: Every day | ORAL | Status: DC
  Administered 2016-11-17 – 2016-11-18 (×2): 40 mg via ORAL
  Filled 2016-11-16 (×2): qty 1

## 2016-11-16 MED ORDER — PRAVASTATIN SODIUM 20 MG PO TABS
20.0000 mg | ORAL_TABLET | Freq: Every day | ORAL | Status: DC
  Administered 2016-11-17: 20 mg via ORAL
  Filled 2016-11-16: qty 1

## 2016-11-16 MED ORDER — FUROSEMIDE 20 MG PO TABS
20.0000 mg | ORAL_TABLET | Freq: Every day | ORAL | Status: DC
  Administered 2016-11-17 – 2016-11-18 (×2): 20 mg via ORAL
  Filled 2016-11-16 (×2): qty 1

## 2016-11-16 NOTE — H&P (Signed)
Patient ID: [name] MRN: [id number], DOB/AGE: 1955-08-11   Admit date: 11/16/2016   Primary Physician: Georgann Housekeeper, MD Primary Cardiologist: New (Dr. Mayford Knife)   Pt. Profile:  61 y/o female with no significant past cardiac history, presenting to the ED with symptomatic bradycardia. Cardiology has been consulted by Dr. Bary Castilla, ED physician.     Problem List  Past Medical History:  Diagnosis Date  . Anxiety   . Chronic pain syndrome   . Depression     History reviewed. No pertinent surgical history.   Allergies  Allergies  Allergen Reactions  . Oxybutynin   . Aspirin Nausea Only    HPI  61 y/o female with no significant past cardiac history, presenting to the ED with symptomatic bradycardia. Cardiology has been consulted by Dr. Bary Castilla, ED physician.   Per chart review, In 2005, she was admitted for chest pain and ultimately underwent a LHC, performed by Dr. Katrinka Blazing, 10/29/2003 which showed esssentially normal coronary arteries with mild plaquing in the mid left anterior descending and distal right coronary artery and normal LVF. Her chest pain at that time was ruled noncardiac. She has not been seen by cardiology since that time. Additional past medical history includes anxiety, depression and chronic pain syndrome. There is no reported h/o HTN, nor DM. She is on lovastatin for DLD.    Pt presented to the ED earlier today with a complaint of dizziness, blurred vision and weakness. She also notes fatigue, which has been present for the last week. She also admits to SSCP off and on for the last week. Feels like tightness but not worse with exertion. On arrival to the ED, she was found to be bradycardic. EKG shows sinus bradycardia with HR of 47 bpm. QT/QTc 594/526 ms (on psych meds for anxiety/depression). No prior EKGs for comparison. Labs are notable for anemia with Hgb at 9.7. No baseline for comparison. Mild hyponatremia with Na level at 134,  mild renal insufficiency with SCr at 1.04 and mild hypocalcemia at 8.8. K is WNL at 3.5. Mg also stable at 2.1. POC troponin in the ED is negative x 1. Thyroid studies not yet obtained. Temp 97.5. CXR negative. Head CT performed for dizziness, was unremarkable. Her home meds have been reviewed. She does not appear to be on any AV nodal blocking agents.   She is currently CP free. Still sinus bradycardia. No pauses or blocks.   Home Medications  Prior to Admission medications   Medication Sig Start Date End Date Taking? Authorizing Provider  ALPRAZolam Prudy Feeler) 1 MG tablet Take 1 mg by mouth.    [provider]  amoxicillin (AMOXIL) 500 MG capsule Take 500 mg by mouth 3 (three) times daily.    [provider]  estradiol (ESTRACE) 1 MG tablet Take 1 mg by mouth daily.    [provider]  fluconazole (DIFLUCAN) 150 MG tablet Take 150 mg by mouth daily.    [provider]  FLUoxetine (PROZAC) 20 MG capsule Take 20 mg by mouth.    [provider]  furosemide (LASIX) 20 MG tablet Take 20 mg by mouth.    [provider]  gabapentin (NEURONTIN) 100 MG capsule Take 100 mg by mouth 3 (three) times daily.    [provider]  lovastatin (MEVACOR) 20 MG tablet Take 20 mg by mouth at bedtime.    [provider]  meloxicam (MOBIC) 15 MG tablet Take 15 mg by mouth.  [provider]  omeprazole (PRILOSEC) 20 MG capsule Take 20 mg by mouth daily.    [provider]  promethazine (PHENERGAN) 12.5 MG tablet Take 12.5 mg by mouth.    [provider]  QUEtiapine (SEROQUEL XR) 50 MG TB24 24 hr tablet Take 25 mg by mouth.    [provider]  ranitidine (ZANTAC) 150 MG tablet Take 150 mg by mouth 2 (two) times daily.    [provider]  tiZANidine (ZANAFLEX) 4 MG tablet Take 4 mg by mouth every 6 (six) hours as needed for muscle spasms.    [provider]    Family History  Family History    Problem Relation Age of Onset  . CAD Father 22    Social History  Social History   Social History  . Marital status: Widowed    Spouse name: N/A  . Number of children: N/A  . Years of education: N/A   Occupational History  . Not on file.   Social History Main Topics  . Smoking status: Never Smoker  . Smokeless tobacco: Never Used  . Alcohol use Not on file  . Drug use: Unknown  . Sexual activity: Not on file   Other Topics Concern  . Not on file   Social History Narrative  . No narrative on file     Review of Systems General:  No chills, fever, night sweats or weight changes.  Cardiovascular:  + chest pain, no dyspnea on exertion, edema, orthopnea, palpitations, paroxysmal nocturnal dyspnea. + for dizziness and fatigue Dermatological: No rash, lesions/masses Respiratory: No cough, dyspnea Urologic: No hematuria, dysuria Abdominal:   No nausea, vomiting, diarrhea, bright red blood per rectum, melena, or hematemesis Neurologic:  No visual changes, wkns, changes in mental status. All other systems reviewed and are otherwise negative except as noted above.  Physical Exam  Blood pressure 118/67, pulse (!) 41, temperature (!) 97.5 F (36.4 C), temperature source Oral, resp. rate 16, height 5\' 3"  (1.6 m), weight 201 lb (91.2 kg), SpO2 95 %.  General: Pleasant, NAD Psych: Normal affect. Neuro: Alert and oriented X 3. Moves all extremities spontaneously. HEENT: Normal  Neck: Supple without bruits or JVD. Lungs:  Resp regular and unlabored, CTA. Heart: RR, bradycardic no s3, s4, or murmurs. Abdomen: Soft, non-tender, non-distended, BS + x 4.  Extremities: No clubbing, cyanosis or edema. DP/PT/Radials 2+ and equal bilaterally.  Labs  Troponin [facility] Care Test)  Recent Labs  11/16/16 1241  TROPIPOC 0.00   No results for input(s): CKTOTAL, CKMB, TROPONINI in the last 72 hours. Lab Results  Component Value Date   WBC 5.6 11/16/2016   HGB 9.7 (L) 11/16/2016    HCT 31.6 (L) 11/16/2016   MCV 75.6 (L) 11/16/2016   PLT 207 11/16/2016     Recent Labs Lab 11/16/16 1225  NA 134*  K 3.5  CL 104  CO2 21*  BUN 20  CREATININE 1.04*  CALCIUM 8.8*  PROT 6.5  BILITOT 0.5  ALKPHOS 76  ALT 10*  AST 19  GLUCOSE 89   No results found for: CHOL, HDL, LDLCALC, TRIG No results found for: DDIMER   Radiology/Studies  Dg Chest 2 View  Result Date: 11/16/2016 CLINICAL DATA:  Nausea, dizziness, syncope EXAM: CHEST  2 VIEW COMPARISON:  None. FINDINGS: The heart size and mediastinal contours are within normal limits. Both lungs are clear. The visualized skeletal structures are unremarkable. IMPRESSION: No active cardiopulmonary disease. Electronically Signed   By: Alan Ripper  Patel   On: 11/16/2016 12:42   Ct Head Wo Contrast  Result Date: 11/16/2016 CLINICAL DATA:  Dizziness EXAM: CT HEAD WITHOUT CONTRAST TECHNIQUE: Contiguous axial images were obtained from the base of the skull through the vertex without intravenous contrast. COMPARISON:  None. FINDINGS: Brain: No evidence of acute infarction, hemorrhage, hydrocephalus, extra-axial collection or mass lesion/mass effect. Mild cortical atrophy. Vascular: Intracranial atherosclerosis. Skull: Normal. Negative for fracture or focal lesion. Sinuses/Orbits: The visualized paranasal sinuses are essentially clear. The mastoid air cells are unopacified. Other: None. IMPRESSION: No evidence of acute intracranial abnormality. Mild cortical atrophy. Electronically Signed   By: Charline BillsSriyesh  Krishnan M.D.   On: 11/16/2016 12:53    ECG  Sinus bradycardia 47 bpm. QT/QTc 594/526 ms -- personally reviewed  Telemetry  Sinus bradycardia, no ischemic abnormalities -- personally reviewed   Echocardiogram No studies on record - will order  ASSESSMENT AND PLAN  1. Symptomatic Bradycardia: Admit to telemetry and monitor. Cycle cardiac enzymes x 3. Check thyroid function studies. We can plan exercise myoview in the am to assess if  HR responds appropriately to exercise and r/o ischemia. 2D echo to assess LVF. If negative w/u and if no identifiable causes found, we will consult EP. ? Need for possible PPM.    Signed, Robbie LisBrittainy Simmons, PA-C, MHS 11/16/2016, 3:07 PM [facility] Pager: [phone]

## 2016-11-16 NOTE — ED Triage Notes (Signed)
Per EMS, patient coming from doctor's office with complaint of nausea/dizziness/near syncope/double vision with sinus bradycardia.  EKG shows Sinus brady.  18G LAC.  RR 16, 50 HR, 110/60.   No meds given en route.  Hx of hypertension and hx of high cholesterol.

## 2016-11-16 NOTE — ED Provider Notes (Signed)
[facility]-EMERGENCY DEPT Provider Note   CSN: [id number] Arrival date & time: 11/16/16  1208     History   Chief Complaint Chief Complaint  Patient presents with  . Bradycardia  . Nausea  . Near Syncope    HPI [name] is a 61 y.o. female.  HPI   Patient is 61 year old female presenting with symptomatic bradycardia. Patient was driving today and felt dizzy, blurred vision, week. Patient's been feeling fatigued for the last several days. Patient had no recent new medications. No cardiac history.     Past Medical History:  Diagnosis Date  . Anxiety   . Chronic pain syndrome   . Depression     There are no active problems to display for this patient.   History reviewed. No pertinent surgical history.  OB History    No data available       Home Medications    Prior to Admission medications   Medication Sig Start Date End Date Taking? Authorizing Provider  ALPRAZolam Prudy Feeler) 1 MG tablet Take 1 mg by mouth.    [provider]  amoxicillin (AMOXIL) 500 MG capsule Take 500 mg by mouth 3 (three) times daily.    [provider]  estradiol (ESTRACE) 1 MG tablet Take 1 mg by mouth daily.    [provider]  fluconazole (DIFLUCAN) 150 MG tablet Take 150 mg by mouth daily.    [provider]  FLUoxetine (PROZAC) 20 MG capsule Take 20 mg by mouth.    [provider]  furosemide (LASIX) 20 MG tablet Take 20 mg by mouth.    [provider]  gabapentin (NEURONTIN) 100 MG capsule Take 100 mg by mouth 3 (three) times daily.    [provider]  lovastatin (MEVACOR) 20 MG tablet Take 20 mg by mouth at bedtime.    [provider]  meloxicam (MOBIC) 15 MG tablet Take 15 mg by mouth.    [provider]  omeprazole (PRILOSEC) 20 MG capsule Take 20 mg by mouth daily.    [provider]  promethazine (PHENERGAN) 12.5 MG tablet Take 12.5 mg by mouth.    [provider]  QUEtiapine  (SEROQUEL XR) 50 MG TB24 24 hr tablet Take 25 mg by mouth.    [provider]  ranitidine (ZANTAC) 150 MG tablet Take 150 mg by mouth 2 (two) times daily.    [provider]  tiZANidine (ZANAFLEX) 4 MG tablet Take 4 mg by mouth every 6 (six) hours as needed for muscle spasms.    [provider]    Family History History reviewed. No pertinent family history.  Social History Social History  Substance Use Topics  . Smoking status: Never Smoker  . Smokeless tobacco: Never Used  . Alcohol use Not on file     Allergies   Oxybutynin and Aspirin   Review of Systems Review of Systems  Constitutional: Negative for activity change.  Respiratory: Negative for chest tightness and shortness of breath.   Cardiovascular: Negative for chest pain.  Gastrointestinal: Negative for abdominal pain.  Neurological: Positive for dizziness, weakness and light-headedness.     Physical Exam Updated Vital Signs BP (!) 108/58 (BP Location: Right Arm)   Pulse (!) 45   Temp (!) 97.5 F (36.4 C) (Oral)   Resp 19   Ht 5\' 3"  (1.6 m)   Wt 91.2 kg (201 lb)   SpO2 96%   BMI 35.61 kg/m   Physical Exam  Constitutional: She is  oriented to person, place, and time. She appears well-developed and well-nourished.  HENT:  Head: Normocephalic and atraumatic.  Eyes: Right eye exhibits no discharge.  Cardiovascular: Normal rate, regular rhythm and normal heart sounds.   No murmur heard. Pulmonary/Chest: Effort normal and breath sounds normal. She has no wheezes. She has no rales.  Abdominal: Soft. She exhibits no distension. There is no tenderness.  Neurological: She is oriented to person, place, and time. No cranial nerve deficit.  Skin: Skin is warm and dry. She is not diaphoretic.  Psychiatric: She has a normal mood and affect.  Nursing note and vitals reviewed.    ED Treatments / Results  Labs (all labs ordered are listed, but only abnormal results are displayed) Labs  Reviewed  CBC WITH DIFFERENTIAL/PLATELET - Abnormal; Notable for the following:       Result Value   Hemoglobin 9.7 (*)    HCT 31.6 (*)    MCV 75.6 (*)    MCH 23.2 (*)    RDW 16.8 (*)    All other components within normal limits  COMPREHENSIVE METABOLIC PANEL - Abnormal; Notable for the following:    Sodium 134 (*)    CO2 21 (*)    Creatinine, Ser 1.04 (*)    Calcium 8.8 (*)    ALT 10 (*)    GFR calc non Af Amer 56 (*)    All other components within normal limits  MAGNESIUM  PHOSPHORUS  I-STAT TROPOININ, ED    EKG  EKG Interpretation  Date/Time:  Wednesday November 16 2016 12:14:42 EDT Ventricular Rate:  47 PR Interval:    QRS Duration: 109 QT Interval:  594 QTC Calculation: 526 R Axis:   19 Text Interpretation:  Sinus bradycardia Low voltage, precordial leads Prolonged QT interval bradycardic sinus Confirmed by Corlis LeakMackuen, Stevan Eberwein ([id number]) on 11/16/2016 1:13:59 PM       Radiology Dg Chest 2 View  Result Date: 11/16/2016 CLINICAL DATA:  Nausea, dizziness, syncope EXAM: CHEST  2 VIEW COMPARISON:  None. FINDINGS: The heart size and mediastinal contours are within normal limits. Both lungs are clear. The visualized skeletal structures are unremarkable. IMPRESSION: No active cardiopulmonary disease. Electronically Signed   By: Elige KoHetal  Patel   On: 11/16/2016 12:42   Ct Head Wo Contrast  Result Date: 11/16/2016 CLINICAL DATA:  Dizziness EXAM: CT HEAD WITHOUT CONTRAST TECHNIQUE: Contiguous axial images were obtained from the base of the skull through the vertex without intravenous contrast. COMPARISON:  None. FINDINGS: Brain: No evidence of acute infarction, hemorrhage, hydrocephalus, extra-axial collection or mass lesion/mass effect. Mild cortical atrophy. Vascular: Intracranial atherosclerosis. Skull: Normal. Negative for fracture or focal lesion. Sinuses/Orbits: The visualized paranasal sinuses are essentially clear. The mastoid air cells are unopacified. Other: None. IMPRESSION: No  evidence of acute intracranial abnormality. Mild cortical atrophy. Electronically Signed   By: Charline BillsSriyesh  Krishnan M.D.   On: 11/16/2016 12:53    Procedures Procedures (including critical care time)  Medications Ordered in ED Medications - No data to display   Initial Impression / Assessment and Plan / ED Course  I have reviewed the triage vital signs and the nursing notes.  Pertinent labs & imaging results that were available during my care of the patient were reviewed by me and considered in my medical decision making (see chart for details).     61 year old female with new onset of symptomatic bradycardia today. Patient was driving today when she had acute onset weakness, blurred vision, lightheadedness. Went PCP found to be bradycardic. Sent  here for further evaluation.   No new medications.  We'll discuss with cardiology.   Final Clinical Impressions(s) / ED Diagnoses   Final diagnoses:  None    New Prescriptions New Prescriptions   No medications on file     Abelino Derrick, MD 11/18/16 2237

## 2016-11-17 ENCOUNTER — Other Ambulatory Visit: Payer: Self-pay | Admitting: Physician Assistant

## 2016-11-17 ENCOUNTER — Observation Stay (HOSPITAL_BASED_OUTPATIENT_CLINIC_OR_DEPARTMENT_OTHER): Payer: Medicare Other

## 2016-11-17 ENCOUNTER — Other Ambulatory Visit: Payer: Self-pay

## 2016-11-17 DIAGNOSIS — I2584 Coronary atherosclerosis due to calcified coronary lesion: Secondary | ICD-10-CM | POA: Diagnosis not present

## 2016-11-17 DIAGNOSIS — I340 Nonrheumatic mitral (valve) insufficiency: Secondary | ICD-10-CM | POA: Diagnosis not present

## 2016-11-17 DIAGNOSIS — R079 Chest pain, unspecified: Secondary | ICD-10-CM

## 2016-11-17 DIAGNOSIS — R9431 Abnormal electrocardiogram [ECG] [EKG]: Secondary | ICD-10-CM

## 2016-11-17 DIAGNOSIS — R001 Bradycardia, unspecified: Secondary | ICD-10-CM

## 2016-11-17 DIAGNOSIS — I2510 Atherosclerotic heart disease of native coronary artery without angina pectoris: Secondary | ICD-10-CM | POA: Diagnosis not present

## 2016-11-17 DIAGNOSIS — I129 Hypertensive chronic kidney disease with stage 1 through stage 4 chronic kidney disease, or unspecified chronic kidney disease: Secondary | ICD-10-CM | POA: Diagnosis not present

## 2016-11-17 LAB — ECHOCARDIOGRAM COMPLETE
HEIGHTINCHES: 63 in
WEIGHTICAEL: 3155.2 [oz_av]

## 2016-11-17 LAB — BASIC METABOLIC PANEL
ANION GAP: 8 (ref 5–15)
BUN: 14 mg/dL (ref 6–20)
CALCIUM: 8.5 mg/dL — AB (ref 8.9–10.3)
CO2: 23 mmol/L (ref 22–32)
Chloride: 109 mmol/L (ref 101–111)
Creatinine, Ser: 1.01 mg/dL — ABNORMAL HIGH (ref 0.44–1.00)
GFR calc non Af Amer: 58 mL/min — ABNORMAL LOW (ref >60)
Glucose, Bld: 96 mg/dL (ref 65–99)
Potassium: 3 mmol/L — ABNORMAL LOW (ref 3.5–5.1)
Sodium: 140 mmol/L (ref 135–145)

## 2016-11-17 LAB — NM MYOCAR MULTI W/SPECT W/WALL MOTION / EF
CHL CUP RESTING HR STRESS: 44 {beats}/min
CSEPHR: 32 %
MPHR: 158 {beats}/min
Peak HR: 52 {beats}/min

## 2016-11-17 LAB — CBC
HEMATOCRIT: 31.7 % — AB (ref 36.0–46.0)
HEMOGLOBIN: 9.6 g/dL — AB (ref 12.0–15.0)
MCH: 22.6 pg — AB (ref 26.0–34.0)
MCHC: 30.3 g/dL (ref 30.0–36.0)
MCV: 74.8 fL — ABNORMAL LOW (ref 78.0–100.0)
Platelets: 196 10*3/uL (ref 150–400)
RBC: 4.24 MIL/uL (ref 3.87–5.11)
RDW: 16.4 % — ABNORMAL HIGH (ref 11.5–15.5)
WBC: 3.8 10*3/uL — ABNORMAL LOW (ref 4.0–10.5)

## 2016-11-17 LAB — LIPID PANEL
CHOL/HDL RATIO: 3.2 ratio
Cholesterol: 130 mg/dL (ref 0–200)
HDL: 41 mg/dL (ref >40)
LDL CALC: 46 mg/dL (ref 0–99)
Triglycerides: 215 mg/dL — ABNORMAL HIGH (ref <150)
VLDL: 43 mg/dL — ABNORMAL HIGH (ref 0–40)

## 2016-11-17 LAB — HIV ANTIBODY (ROUTINE TESTING W REFLEX): HIV Screen 4th Generation wRfx: NONREACTIVE

## 2016-11-17 LAB — TROPONIN I: Troponin I: <0.03 ng/mL (ref <0.03)

## 2016-11-17 LAB — TSH: TSH: 1.514 u[IU]/mL (ref 0.350–4.500)

## 2016-11-17 MED ORDER — POTASSIUM CHLORIDE CRYS ER 20 MEQ PO TBCR
20.0000 meq | EXTENDED_RELEASE_TABLET | Freq: Every day | ORAL | Status: DC
  Administered 2016-11-18: 20 meq via ORAL
  Filled 2016-11-17 (×2): qty 1

## 2016-11-17 MED ORDER — TIZANIDINE HCL 4 MG PO TABS
4.0000 mg | ORAL_TABLET | Freq: Four times a day (QID) | ORAL | Status: DC | PRN
  Administered 2016-11-17 (×2): 4 mg via ORAL
  Filled 2016-11-17 (×2): qty 1

## 2016-11-17 MED ORDER — FLUOXETINE HCL 20 MG PO CAPS
20.0000 mg | ORAL_CAPSULE | Freq: Every day | ORAL | Status: DC
  Administered 2016-11-18: 20 mg via ORAL
  Filled 2016-11-17: qty 1

## 2016-11-17 MED ORDER — REGADENOSON 0.4 MG/5ML IV SOLN
INTRAVENOUS | Status: AC
  Administered 2016-11-17: 0.4 mg
  Filled 2016-11-17: qty 5

## 2016-11-17 MED ORDER — CYCLOBENZAPRINE HCL 10 MG PO TABS
10.0000 mg | ORAL_TABLET | Freq: Three times a day (TID) | ORAL | Status: DC | PRN
  Administered 2016-11-17 – 2016-11-18 (×3): 10 mg via ORAL
  Filled 2016-11-17 (×3): qty 1

## 2016-11-17 MED ORDER — TECHNETIUM TC 99M TETROFOSMIN IV KIT
10.0000 | PACK | Freq: Once | INTRAVENOUS | Status: AC | PRN
  Administered 2016-11-17: 10 via INTRAVENOUS

## 2016-11-17 MED ORDER — HYDROCODONE-ACETAMINOPHEN 5-325 MG PO TABS
1.0000 | ORAL_TABLET | Freq: Four times a day (QID) | ORAL | Status: DC | PRN
  Administered 2016-11-17 – 2016-11-18 (×5): 1 via ORAL
  Filled 2016-11-17 (×5): qty 1

## 2016-11-17 MED ORDER — ESTRADIOL 1 MG PO TABS
1.0000 mg | ORAL_TABLET | Freq: Every day | ORAL | Status: DC
  Administered 2016-11-17 – 2016-11-18 (×2): 1 mg via ORAL
  Filled 2016-11-17 (×2): qty 1

## 2016-11-17 MED ORDER — POTASSIUM CHLORIDE CRYS ER 20 MEQ PO TBCR
40.0000 meq | EXTENDED_RELEASE_TABLET | Freq: Two times a day (BID) | ORAL | Status: AC
  Administered 2016-11-17 (×2): 40 meq via ORAL
  Filled 2016-11-17 (×2): qty 2

## 2016-11-17 MED ORDER — TECHNETIUM TC 99M TETROFOSMIN IV KIT
30.0000 | PACK | Freq: Once | INTRAVENOUS | Status: AC | PRN
  Administered 2016-11-17: 30 via INTRAVENOUS

## 2016-11-17 NOTE — Progress Notes (Signed)
Nutrition Brief Note  Patient identified on the Malnutrition Screening Tool (MST) Report  Wt Readings from Last 15 Encounters:  11/17/16 197 lb 3.2 oz (89.4 kg)   Pt unsure if she has lost any weight or not, reports she does not have a scale at home. Clothing fits the same  Nutrition-Focused physical exam completed. Findings are WDL for fat depletion, muscle depletion, and edema.   Body mass index is 34.93 kg/m. Patient meets criteria for obesity unspecified based on current BMI.   Current diet order is NPO post procedure, awaiting diet progression.  Appetite fair but pt reports she always has a fair appetite. Appetite unchanged for pt. Labs and medications reviewed.   No nutrition interventions warranted at this time. If nutrition issues arise, please consult RD.   Kelli Starcherate Monterio Bob MS, RD, LDN [phone] Pager  [phone] Weekend/On-Call Pager

## 2016-11-17 NOTE — Discharge Summary (Signed)
Discharge Summary    Patient ID: [name],  MRN: [id number], DOB/AGE: [birth year] 61 y.o.  Admit date: 11/16/2016 Discharge date: 11/18/2016   Primary Care Provider: Georgann Housekeeper Primary Cardiologist: new - Dr. 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 Aspirin Nausea Only  . Oxybutynin Rash     History of Present Illness    From H&P note:  61 y/o female with no significant past cardiac history, presenting to the ED with symptomatic bradycardia. Cardiology has been consulted by Dr. Bary Castilla, ED physician.   Per chart review, In 2005, she was admitted for chest pain and ultimately underwent a LHC, performed by Dr. Katrinka Blazing, 10/29/2003 which showed esssentially normal coronary arteries with mild plaquing in the mid left anterior descending and distal right coronary artery and normal LVF. Her chest pain at that time was ruled noncardiac. She has not been seen by cardiology since that time. Additional past medical history includes anxiety, depression and chronic pain syndrome. There is no reported h/o HTN, nor DM. She is on lovastatin for DLD.    Pt presented to the ED earlier today with a complaint of dizziness, blurred vision and weakness. She also notes fatigue, which has been present for the last week. She also admits to SSCP off and on for the last week. Feels like tightness but not worse with exertion. On arrival to the ED, she was found to be bradycardic. EKG shows sinus bradycardia with HR of 47 bpm. QT/QTc 594/526 ms (on psych meds for anxiety/depression). No prior EKGs for comparison. Labs are notable for anemia with Hgb at 9.7. No baseline for comparison. Mild hyponatremia  with Na level at 134, mild renal insufficiency with SCr at 1.04 and mild hypocalcemia at 8.8. K is WNL at 3.5. Mg also stable at 2.1. POC troponin in the ED is negative x 1. Thyroid studies not yet obtained. Temp 97.5. CXR negative. Head CT performed for dizziness, was unremarkable. Her home meds have been reviewed. She does not appear to be on any AV nodal blocking agents.   She is currently CP free. Still sinus bradycardia. No pauses or blocks.   Hospital Course     Pt admitted to telemetry. HR ranged in the 40s-50s. TSH normal. Troponins negative x 3. 2D echo showed normal LVF with mild MR and moderate LAE. Exercise nuclear study was arranged, however pt was unable to walk on the treadmill, thus could not assess for chronotropic incompetence. This was changed to a Lexiscan NST, which showed a moderate size area of decreased myocardial activity seen in the mid and distal inferior wall on stress imaging. This shows reversibility on rest imaging, suspicious for inducible ischemia. Coronary CTA showed high calcium score of 848 and possible hemodynamically significant stenosis in the proximal and mid LAD.  She was referred for LHC, performed on 11/18/16 and was found to have Mild to moderate, nonobstructive coronary artery disease, 40% mid LAD, 50% diag, 25% prox-mid RCA. Medical therapy recommended. Her lovastatin was changed to Crestor. Estrace was discontinued in the setting of CAD. No ASA given PUD.   In addition, admission EKG showed prolonged QTC at 526 msec in setting of hypokalemia with a  K+ or 3.0. She was given supplemental K and Zanaflex, Phenergan and Seroquel were discontinued. Her hypokalemia resolved and her QT/QTc normalized. Prozac continued with out any further prolongation of QT.  EP was consulted regarding her bradycardia. Her symptoms of fatigue were vague. She has had fatigue for many years and multiple chronic problems including fibromyalgia. It was unclear if her symptoms were due to  bradcardia. EP recommended further monitoring as an outpatient with a cardiac monitor to assess degree of bradycardia and to try to correlate symptoms with significant bradycardia. They also agreed with avoidance of QT prolonging drugs.   She also has a h/o PUD in the past and has problems with vomiting and blood in her emesis when she vomits a lot - she has been followed by Dr. Laural Benes in the past who is now retired. Recs were for referral to new GI specialist after discharge. PPI was changed from omeprazole to Protonix 40 mg.   Patient seen and examined by Dr. Mayford Knife today and was stable for discharge. All follow up has been arranged.  Consultants: EP _____________  Discharge Vitals Blood pressure (!) 154/70, pulse (!) 55, temperature 98.5 F (36.9 C), temperature source Oral, resp. rate 15, height 5\' 3"  (1.6 m), weight 196 lb 9.6 oz (89.2 kg), SpO2 90 %.  Filed Weights   11/16/16 2253 11/17/16 0540 11/18/16 0517  Weight: 197 lb 6.4 oz (89.5 kg) 197 lb 3.2 oz (89.4 kg) 196 lb 9.6 oz (89.2 kg)    Labs & Radiologic Studies    CBC  Recent Labs  11/16/16 1225  11/17/16 0413 11/18/16 1357  WBC 5.6  < > 3.8* 4.0  NEUTROABS 3.7  --   --   --   HGB 9.7*  < > 9.6* 10.8*  HCT 31.6*  < > 31.7* 35.2*  MCV 75.6*  < > 74.8* 74.9*  PLT 207  < > 196 191  < > = values in this interval not displayed. Basic Metabolic Panel  Recent Labs  11/16/16 1328  11/18/16 1204 11/18/16 1357  NA  --   < > 137 139  K  --   < > 4.6 4.0  CL  --   < > 104 105  CO2  --   < > 25 25  GLUCOSE  --   < > 88 83  BUN  --   < > 10 10  CREATININE  --   < > 1.19* 1.11*  CALCIUM  --   < > 9.6 9.1  MG 2.1  --   --   --   PHOS 2.9  --   --   --   < > = values in this interval not displayed. Liver Function Tests  Recent Labs  11/16/16 1225  AST 19  ALT 10*  ALKPHOS 76  BILITOT 0.5  PROT 6.5  ALBUMIN 3.5   No results for input(s): LIPASE, AMYLASE in the last 72 hours. Cardiac Enzymes  Recent Labs   11/16/16 2252 11/17/16 0413 11/17/16 1208  TROPONINI <0.03 <0.03 <0.03   BNP Invalid input(s): POCBNP D-Dimer No results for input(s): DDIMER in the last 72 hours. Hemoglobin A1C  Recent Labs  11/16/16 2252  HGBA1C 5.4   Fasting Lipid Panel  Recent Labs  11/17/16 0413  CHOL 130  HDL 41  LDLCALC 46  TRIG 215*  CHOLHDL 3.2   Thyroid Function Tests  Recent Labs  11/16/16 2252  TSH 1.514   _____________  Dg Chest 2 View  Result Date: 11/16/2016 CLINICAL DATA:  Nausea, dizziness, syncope EXAM: CHEST  2 VIEW COMPARISON:  None. FINDINGS: The heart size and mediastinal contours are within normal limits. Both lungs are clear. The visualized skeletal structures are unremarkable. IMPRESSION: No active cardiopulmonary disease. Electronically Signed   By: Elige KoHetal  Patel   On: 11/16/2016 12:42   Ct Head Wo Contrast  Result Date: 11/16/2016 CLINICAL DATA:  Dizziness EXAM: CT HEAD WITHOUT CONTRAST TECHNIQUE: Contiguous axial images were obtained from the base of the skull through the vertex without intravenous contrast. COMPARISON:  None. FINDINGS: Brain: No evidence of acute infarction, hemorrhage, hydrocephalus, extra-axial collection or mass lesion/mass effect. Mild cortical atrophy. Vascular: Intracranial atherosclerosis. Skull: Normal. Negative for fracture or focal lesion. Sinuses/Orbits: The visualized paranasal sinuses are essentially clear. The mastoid air cells are unopacified. Other: None. IMPRESSION: No evidence of acute intracranial abnormality. Mild cortical atrophy. Electronically Signed   By: Charline BillsSriyesh  Krishnan M.D.   On: 11/16/2016 12:53   Nm Myocar Multi W/spect W/wall Motion / Ef  Result Date: 11/17/2016 CLINICAL DATA:  Chest pain with moderate risk for cardiac etiology. Symptomatic bradycardia. Hypertension and hypercholesterolemia. EXAM: MYOCARDIAL IMAGING WITH SPECT (REST AND PHARMACOLOGIC-STRESS) GATED LEFT VENTRICULAR WALL MOTION STUDY LEFT VENTRICULAR EJECTION  FRACTION TECHNIQUE: Standard myocardial SPECT imaging was performed after resting intravenous injection of 10 mCi Tc-9654m tetrofosmin. Subsequently, intravenous infusion of Lexiscan was performed under the supervision of the Cardiology staff. At peak effect of the drug, 30 mCi Tc-3554m tetrofosmin was injected intravenously and standard myocardial SPECT imaging was performed. Quantitative gated imaging was also performed to evaluate left ventricular wall motion, and estimate left ventricular ejection fraction. COMPARISON:  None. FINDINGS: Perfusion: A moderate size area of decreased myocardial activity is seen in the mid and distal inferior wall on stress imaging. This shows reversibility on rest imaging, suspicious for inducible ischemia. Wall Motion: Normal left ventricular wall motion. No left ventricular dilation. Left Ventricular Ejection Fraction: 54 % End diastolic volume 121 ml End systolic volume 56 ml IMPRESSION: 1. Moderate reversible defect in the mid and distal inferior wall, suspicious for inducible ischemia. 2. Normal left ventricular wall motion. 3. Left ventricular ejection fraction 54% 4. Non invasive risk stratification*: Intermediate *2012 Appropriate Use Criteria for Coronary Revascularization Focused Update: J Am Coll Cardiol. 2012;59(9):857-881. http://content.dementiazones.comonlinejacc.org/article.aspx?articleid=1201161 Electronically Signed   By: Myles RosenthalJohn  Stahl M.D.   On: 11/17/2016 17:44   Ct Coronary Morph W/cta Cor W/score W/ca W/cm &/or Wo/cm  Addendum Date: 11/18/2016   ADDENDUM REPORT: 11/18/2016 10:35 CLINICAL DATA:  Chest pain EXAM: Cardiac CTA MEDICATIONS: Sub lingual nitro. 4mg  and lopressor mg TECHNIQUE: The patient was scanned on a Siemens 192 slice scanner. Gantry rotation speed was 250 msecs. Collimation was .9mm. A 100 kV prospective scan was triggered in the ascending thoracic aorta at 140 HU's with full mA between 35-70% of the R-R interval. Average HR during the scan was bpm. The 3D data set  was interpreted on a dedicated work station using MPR, MIP and VRT modes. A total of 80cc of contrast was used. FINDINGS: Non-cardiac: [facility]. No significant findings on limited lung and soft tissue windows. Calcium Score:  848 dense calcium in proximal / mid LAD and RCA Coronary Arteries: Right dominant with no anomalies LM: Normal LAD:  50-75% proximal and mid LAD stenosis with dense mixed plaque D1:  Small not well seen D2:  Small not well seen Circumflex: Normal OM1: Normal RCA: Dominant less than 30% calcified proximal and mid vessel  disease PDA: Normal PLA:  Some calcium small vessel not well seen Distal vessels not well seen IMPRESSION: 1) Calcium score 848 98th percentile for age and sex 2) Possible hemodynamically significant stenosis in the proximal and mid LAD Charlton Haws Electronically Signed   By: Charlton Haws M.D.   On: 11/18/2016 10:35   Result Date: 11/18/2016 EXAM: OVER-READ INTERPRETATION  CT CHEST The following report is an over-read performed by radiologist Dr. Noe Gens [facility] on 11/18/2016. This over-read does not include interpretation of cardiac or coronary anatomy or pathology. The coronary CTA interpretation by the cardiologist is attached. COMPARISON:  None. FINDINGS: Cardiovascular: Heart is upper limits normal in size. Ascending aorta upper limits normal in diameter at 3.6 cm. No dissection. Mediastinum/Nodes: Large hiatal hernia. No adenopathy in the lower mediastinum or hila. Lungs/Pleura: Dependent atelectasis in the lung bases. No confluent opacities or effusions. Upper Abdomen: Imaging into the upper abdomen shows no acute findings. Musculoskeletal: Chest wall soft tissues are unremarkable. No acute bony abnormality. IMPRESSION: No acute extracardiac abnormality. Large hiatal hernia. Electronically Signed: By: Charlett Nose M.D. On: 11/18/2016 09:41     Diagnostic Studies/Procedures    NST  11/17/16:  FINDINGS: Perfusion: A moderate size area of decreased myocardial activity is seen in the mid and distal inferior wall on stress imaging. This shows reversibility on rest imaging, suspicious for inducible ischemia.  Wall Motion: Normal left ventricular wall motion. No left ventricular dilation.  Left Ventricular Ejection Fraction: 54 %  End diastolic volume 121 ml  End systolic volume 56 ml  IMPRESSION: 1. Moderate reversible defect in the mid and distal inferior wall, suspicious for inducible ischemia.  2. Normal left ventricular wall motion.  3. Left ventricular ejection fraction 54%  4. Non invasive risk stratification*: Intermediate  Echocardiogram 11/17/16: Study Conclusions - Left ventricle: The cavity size was normal. Wall thickness was   normal. Systolic function was normal. The estimated ejection   fraction was in the range of 60% to 65%. - Mitral valve: There was mild regurgitation. - Left atrium: The atrium was moderately dilated.  Coronary CTA 11/18/16 FINDINGS: Non-cardiac: See separate report from [facility]. No significant findings on limited lung and soft tissue windows.  Calcium Score:  848 dense calcium in proximal / mid LAD and RCA  Coronary Arteries: Right dominant with no anomalies  LM: Normal  LAD:  50-75% proximal and mid LAD stenosis with dense mixed plaque  D1:  Small not well seen  D2:  Small not well seen  Circumflex: Normal  OM1: Normal  RCA: Dominant less than 30% calcified proximal and mid vessel disease  PDA: Normal  PLA:  Some calcium small vessel not well seen  Distal vessels not well seen  IMPRESSION: 1) Calcium score 848 98th percentile for age and sex  2) Possible hemodynamically significant stenosis in the proximal and mid LAD  LHC 11/18/16 Procedures   Left Heart Cath and Coronary Angiography  Conclusion   Conclusions: 1. Mild to moderate, nonobstructive  coronary artery disease as detailed below. 2. Normal left ventricular filling pressure.  Recommendations: 1. Medical therapy and risk factor modification to prevent progression of coronary artery disease. 2. We will discontinue standing furosemide, given the low normal left ventricular filling pressure in the setting of normal LV systolic function.     Disposition   Pt is being discharged home today in good condition.   Follow-up Plans & Appointments    Follow-up Information    Dyann Kief,  PA-C Follow up on 11/28/2016.   Specialty:  Cardiology Why:  11:45 am for hospital follow up Contact information: [address] [place] [address] [phone]          Discharge Instructions    Diet - low sodium heart healthy    Complete by:  As directed    Increase activity slowly    Complete by:  As directed       Discharge Medications   Current Discharge Medication List    START taking these medications   Details  cyclobenzaprine (FLEXERIL) 10 MG tablet Take 1 tablet (10 mg total) by mouth 3 (three) times daily as needed for muscle spasms. Qty: 90 tablet, Refills: 2    nitroGLYCERIN (NITROSTAT) 0.4 MG SL tablet Place 1 tablet (0.4 mg total) under the tongue every 5 (five) minutes x 3 doses as needed for chest pain. Qty: 25 tablet, Refills: 2    pantoprazole (PROTONIX) 40 MG tablet Take 1 tablet (40 mg total) by mouth daily. Qty: 30 tablet, Refills: 5    potassium chloride SA (K-DUR,KLOR-CON) 20 MEQ tablet Take 1 tablet (20 mEq total) by mouth daily. Qty: 30 tablet, Refills: 5    rosuvastatin (CRESTOR) 20 MG tablet Take 1 tablet (20 mg total) by mouth daily at 6 PM. Qty: 30 tablet, Refills: 5      CONTINUE these medications which have NOT CHANGED   Details  Acetaminophen-Caffeine (EXCEDRIN ASPIRIN FREE PO) Take 2 tablets by mouth daily as needed (pain).    ALPRAZolam (XANAX) 1 MG tablet Take 0.5 mg by mouth 2 (two) times daily.     Alum &  Mag Hydroxide-Simeth (MAALOX ADVANCED PO) Take by mouth See admin instructions. Pt states she takes "2 big swallows" when needed    FLUoxetine (PROZAC) 20 MG capsule Take 20 mg by mouth daily.     furosemide (LASIX) 20 MG tablet Take 20 mg by mouth daily.     gabapentin (NEURONTIN) 100 MG capsule Take 100 mg by mouth 3 (three) times daily.    HYDROcodone-acetaminophen (NORCO/VICODIN) 5-325 MG tablet Take 0.5-1 tablets by mouth See admin instructions. Pt takes half(1/2) a tablet during the day and 1 tablet at night    meloxicam (MOBIC) 15 MG tablet Take 15 mg by mouth daily.     ranitidine (ZANTAC) 150 MG tablet Take 150 mg by mouth 2 (two) times daily.      STOP taking these medications     estradiol (ESTRACE) 1 MG tablet      lovastatin (MEVACOR) 20 MG tablet      omeprazole (PRILOSEC) 20 MG capsule      promethazine (PHENERGAN) 12.5 MG tablet      QUEtiapine (SEROQUEL XR) 50 MG TB24 24 hr tablet      tiZANidine (ZANAFLEX) 4 MG tablet            Outstanding Labs/Studies   EKG for QTc. BMP for K for recent potassium supplement. Outpatient 30 day monitor (order placed)  Duration of Discharge Encounter   Greater than 30 minutes including physician time.  Signed, Robbie Lis PA-C 11/18/2016, 7:38 PM

## 2016-11-17 NOTE — Progress Notes (Signed)
Nuclear stress test showed ? Partially fixed defect in the mid and apical inferior wall but there is gut uptake on stress images.  Suspect this is attenuation artifact and gut uptake but cannot be sure.  Recommend coronary CTA with morphology and FFR/Ca score in am.  Also, repeat EKG after repleting potassium shows improved QTc at .  Instructed patient to avoid phenergan and seroquel as well as Zanaflex.  Will continue on Prozac for now.  Repeat BMET tonight to make sure potassium normal and repeat EKG in am.

## 2016-11-17 NOTE — Progress Notes (Signed)
Progress Note  Patient Name: [name] Date of Encounter: 11/17/2016  Primary Cardiologist: new - Dr. Mayford Knife  Subjective   Patient is anxious awaiting echo and stress test results.  Inpatient Medications    Scheduled Meds: . aspirin EC  81 mg Oral Daily  . estradiol  1 mg Oral Daily  . feeding supplement (ENSURE ENLIVE)  237 mL Oral BID BM  . FLUoxetine  20 mg Oral Daily  . furosemide  20 mg Oral Daily  . gabapentin  100 mg Oral TID  . heparin  5,000 Units Subcutaneous Q8H  . pantoprazole  40 mg Oral Daily  . pravastatin  20 mg Oral q1800   Continuous Infusions:  PRN Meds: acetaminophen, ALPRAZolam, HYDROcodone-acetaminophen, nitroGLYCERIN, tiZANidine   Vital Signs    Vitals:   11/17/16 0936 11/17/16 0938 11/17/16 0939 11/17/16 1216  BP: 125/79 120/76 121/73 (!) 109/59  Pulse: (!) 52 (!) 45 (!) 44 (!) 43  Resp: 20 18 18 20   Temp:    97.6 F (36.4 C)  TempSrc:    Oral  SpO2:    94%  Weight:      Height:        Intake/Output Summary (Last 24 hours) at 11/17/16 1333 Last data filed at 11/17/16 1000  Gross per 24 hour  Intake              240 ml  Output                0 ml  Net              240 ml   Filed Weights   11/16/16 1211 11/16/16 2253 11/17/16 0540  Weight: 201 lb (91.2 kg) 197 lb 6.4 oz (89.5 kg) 197 lb 3.2 oz (89.4 kg)     Physical Exam   General: Well developed, well nourished, female appearing in no acute distress. Head: Normocephalic, atraumatic.  Neck: Supple without bruits, no JVD Lungs:  Resp regular and unlabored, CTA. Heart: Regular rhythm, bradycardic rate, S1, S2, no S3, S4, or murmur; no rub. Abdomen: Soft, non-tender, non-distended with normoactive bowel sounds. No hepatomegaly. No rebound/guarding. No obvious abdominal masses. Extremities: No clubbing, cyanosis, no edema. Distal pedal pulses are 2+ bilaterally. Neuro: Alert and oriented X 3. Moves all extremities spontaneously. Psych: Normal affect.  Labs      Chemistry Recent Labs Lab 11/16/16 1225 11/16/16 2252 11/17/16 0413  NA 134*  --  140  K 3.5  --  3.0*  CL 104  --  109  CO2 21*  --  23  GLUCOSE 89  --  96  BUN 20  --  14  CREATININE 1.04* 0.99 1.01*  CALCIUM 8.8*  --  8.5*  PROT 6.5  --   --   ALBUMIN 3.5  --   --   AST 19  --   --   ALT 10*  --   --   ALKPHOS 76  --   --   BILITOT 0.5  --   --   GFRNONAA 56* 60* 58*  GFRAA >60 >60 >60  ANIONGAP 9  --  8     Hematology Recent Labs Lab 11/16/16 1225 11/16/16 2252 11/17/16 0413  WBC 5.6 3.8* 3.8*  RBC 4.18 4.25 4.24  HGB 9.7* 9.8* 9.6*  HCT 31.6* 31.6* 31.7*  MCV 75.6* 74.4* 74.8*  MCH 23.2* 23.1* 22.6*  MCHC 30.7 31.0 30.3  RDW 16.8* 16.3* 16.4*  PLT 207 177 196  Cardiac Enzymes Recent Labs Lab 11/16/16 2252 11/17/16 0413 11/17/16 1208  TROPONINI <0.03 <0.03 <0.03    Recent Labs Lab 11/16/16 1241  TROPIPOC 0.00     BNPNo results for input(s): BNP, PROBNP in the last 168 hours.   DDimer No results for input(s): DDIMER in the last 168 hours.   Radiology    Dg Chest 2 View  Result Date: 11/16/2016 CLINICAL DATA:  Nausea, dizziness, syncope EXAM: CHEST  2 VIEW COMPARISON:  None. FINDINGS: The heart size and mediastinal contours are within normal limits. Both lungs are clear. The visualized skeletal structures are unremarkable. IMPRESSION: No active cardiopulmonary disease. Electronically Signed   By: Elige KoHetal  Patel   On: 11/16/2016 12:42   Ct Head Wo Contrast  Result Date: 11/16/2016 CLINICAL DATA:  Dizziness EXAM: CT HEAD WITHOUT CONTRAST TECHNIQUE: Contiguous axial images were obtained from the base of the skull through the vertex without intravenous contrast. COMPARISON:  None. FINDINGS: Brain: No evidence of acute infarction, hemorrhage, hydrocephalus, extra-axial collection or mass lesion/mass effect. Mild cortical atrophy. Vascular: Intracranial atherosclerosis. Skull: Normal. Negative for fracture or focal lesion. Sinuses/Orbits: The  visualized paranasal sinuses are essentially clear. The mastoid air cells are unopacified. Other: None. IMPRESSION: No evidence of acute intracranial abnormality. Mild cortical atrophy. Electronically Signed   By: Charline BillsSriyesh  Krishnan M.D.   On: 11/16/2016 12:53     Telemetry    Sinus bradycardia - Personally Reviewed  ECG    Sinus bradycardia - Personally Reviewed   Cardiac Studies   Myoview 11/17/16: pending  Echocardiogram 11/17/16: pending  Patient Profile     61 y.o. female with no significant past cardiac history, presenting to the ED with symptomatic bradycardia.   Assessment & Plan    1. Symptomatic bradycardia - patient underwent lexiscan myoview today, results pending - unfortunately she was unable to walk on the treadmill, so heart rate with activity could not be assessed; HR increased from 44 to 51 during lexiscan stress - echocardiogram also pending - EP consult pending     Signed, Marcelino Dusterngela Nicole Tynesia Harral , PA-C 1:33 PM 11/17/2016 Pager: [phone]

## 2016-11-17 NOTE — Progress Notes (Signed)
  Echocardiogram 2D Echocardiogram has been performed.  Adalynne Steffensmeier T Joylynn Defrancesco 11/17/2016, 2:13 PM

## 2016-11-17 NOTE — Consult Note (Signed)
Cardiology Consultation:   Patient ID: [name]; [id number]; 31-Jan-1956   Admit date: 11/16/2016 Date of Consult: 11/17/2016  Primary Care Provider: Georgann Housekeeper, MD Primary Cardiologist: New, Dr. Mayford Knife Primary Electrophysiologist:  New, Dr. Elberta Fortis   Patient Profile:   [name] is a 61 y.o. female with a hx of anxiety, arthritis, CBP, fibromyalgia, HLD, chronic nausea, severe GERD, fibromyalgia, severe and chronic orthopedic pains, insomnia 2/2 night terrors with chronic fatigue, who is being seen today for the evaluation of bradycardia at the request of Dr. Mayford Knife.  History of Present Illness:   [name] was referred to the ER by her PMD noting her HR in the 40's with complaints of increased fatigue, dizziness, chest discomfort.  The patient reports that yesterday she had a routine 50mo appt already in [place] with her PMD, though coincidentally yesterday feeling especially bad.  She reports a constant baseline nausea that was significantly worse then usual yesterday, she was driving to the appointment felt like her vision was blurred with an unusual headache and in general just feeling terrible.  She reports that her HR was 39 and her BP was unusually high.  These symptoms started only yesterday feeling like she was at her baseline prior to this.  She reports she always feels tired, given she never sleeps more then an hour or so nightly (chronically), though in general here has felt like she is in a "fog" or "off".  She also in the last 3 weeks has been getting CP, left sided and described as tightness.  This can last a few minutes to all day not clear provoking or relieving factors, no associated symptoms.  She does not have any palpitations or awareness of her heart beat.  She tells me she only feels dizzy when she stands to quickly or standing up after bending forward/down.  She reports for "a long time" feeling like she "wobbles" this she has attributed to her  fibromyalgia and is a feeling of loss of balance for a few moments.  She reports this as occasionally and dates back perhaps years.  She does not feel like she is lightheaded, she denies any near syncope or syncope.  She denies any fever/illness recently, no new medicines prescribed or OTC.  Past Medical History:  Diagnosis Date  . Anemia   . Anxiety   . Arthritis    "all over" (11/16/2016)  . Chronic lower back pain   . Chronic pain syndrome   . Chronic pain syndrome    "all over" (11/16/2016)  . CKD (chronic kidney disease), stage III   . Daily headache   . Depression   . Fibromyalgia   . GERD (gastroesophageal reflux disease)   . High cholesterol   . History of blood transfusion    "related to hand laceration"  . History of hiatal hernia   . History of stomach ulcers    "bled when I was younger" (11/16/2016)  . Hypertension    hx; "they took me off my RX" (11/16/2016)  . Migraine 1970s  . Phlebitis of leg, right    "years ago" (11/16/2016)    Past Surgical History:  Procedure Laterality Date  . ABDOMINAL HYSTERECTOMY  1993   w/left ovary  . ANKLE FRACTURE SURGERY Bilateral   . APPENDECTOMY  1977  . BACK SURGERY    . CARDIAC CATHETERIZATION  2005   "negative"  . CHOLECYSTECTOMY OPEN  1977  . ESOPHAGOGASTRODUODENOSCOPY  2008   dx'd GERD  . FOREARM  FRACTURE SURGERY Right   . HIP SURGERY Bilateral    "took out bone spurs"  . JOINT REPLACEMENT    . KNEE ARTHROSCOPY Bilateral    "total of 18 times"  . LACERATION REPAIR Right    hand; "went thru glass door"  . LUMBAR FUSION     "put rods in"  . LUMBAR LAMINECTOMY/DECOMPRESSION MICRODISCECTOMY  05/2006  . REVISION TOTAL KNEE ARTHROPLASTY Bilateral   . RIGHT OOPHORECTOMY Right   . SHOULDER OPEN ROTATOR CUFF REPAIR Bilateral    left 04/2006  . TONSILLECTOMY    . TOTAL KNEE ARTHROPLASTY Bilateral    left 1998; right 2004  . TUBAL LIGATION       Inpatient Medications: Scheduled Meds: . aspirin EC  81 mg Oral Daily   . estradiol  1 mg Oral Daily  . feeding supplement (ENSURE ENLIVE)  237 mL Oral BID BM  . FLUoxetine  20 mg Oral Daily  . furosemide  20 mg Oral Daily  . gabapentin  100 mg Oral TID  . heparin  5,000 Units Subcutaneous Q8H  . pantoprazole  40 mg Oral Daily  . pravastatin  20 mg Oral q1800   Continuous Infusions:  PRN Meds: acetaminophen, ALPRAZolam, HYDROcodone-acetaminophen, nitroGLYCERIN, tiZANidine  Allergies:    Allergies  Allergen Reactions  . Aspirin Nausea Only  . Oxybutynin Rash    Social History:   Social History   Social History  . Marital status: Widowed    Spouse name: N/A  . Number of children: N/A  . Years of education: N/A   Occupational History  . Not on file.   Social History Main Topics  . Smoking status: Never Smoker  . Smokeless tobacco: Never Used  . Alcohol use No  . Drug use: No  . Sexual activity: Yes   Other Topics Concern  . Not on file   Social History Narrative  . No narrative on file    Family History:   The patient's family history includes CAD (age of onset: 80) in her father.  ROS:  Please see the history of present illness.  ROS  All other ROS reviewed and negative.     Physical Exam/Data:   Vitals:   11/17/16 0936 11/17/16 0938 11/17/16 0939 11/17/16 1216  BP: 125/79 120/76 121/73 (!) 109/59  Pulse: (!) 52 (!) 45 (!) 44 (!) 43  Resp: 20 18 18 20   Temp:    97.6 F (36.4 C)  TempSrc:    Oral  SpO2:    94%  Weight:      Height:        Intake/Output Summary (Last 24 hours) at 11/17/16 1427 Last data filed at 11/17/16 1000  Gross per 24 hour  Intake              240 ml  Output                0 ml  Net              240 ml   Filed Weights   11/16/16 1211 11/16/16 2253 11/17/16 0540  Weight: 201 lb (91.2 kg) 197 lb 6.4 oz (89.5 kg) 197 lb 3.2 oz (89.4 kg)   Body mass index is 34.93 kg/m.  General:  Well nourished, well developed, in no acute distress HEENT: normal Lymph: no adenopathy Neck: no  JVD Endocrine:  No thryomegaly Vascular: No carotid bruits; FA pulses 2+ bilaterally without bruits  Cardiac:  normal S1, S2; RRR; no murmurs,  gallops or rubs Lungs:  CTA b/l, no wheezing, rhonchi or rales  Abd: soft, nontender  Ext: no edema Musculoskeletal:  No deformities, BUE and BLE strength normal and equal Skin: warm and dry  Neuro:  CNs 2-12 intact, no focal abnormalities noted Psych:  Normal affect   EKG:  The EKG was personally reviewed and demonstrates:  SB, 47bpm, PR , QRS , QTappears long, I measure , corrected Telemetry:  Telemetry was personally reviewed and demonstrates:  SB 40's, no heart block is noted  Relevant CV Studies:  11/17/16: TTE Study Conclusions - Left ventricle: The cavity size was normal. Wall thickness was   normal. Systolic function was normal. The estimated ejection   fraction was in the range of 60% to 65%. - Mitral valve: There was mild regurgitation. - Left atrium: The atrium was moderately dilated.  11/17/16: Lexiscan stress test: NUCLEAR REPORT IS PENDING ECG Baseline ECG exhibits normal sinus rhythm..    Stress Findings A pharmacological stress test was performed using IV Lexiscan 0.4mg  over 10 seconds performed without concurrent submaximal exercise.  The patient reported flushing during the stress test.  Felt warm, chest tightness    Response to Stress There was no ST segment deviation noted during stress.  Arrhythmias during stress: rare PVCs.  Arrhythmias during recovery: none.  Arrhythmias were not significant.  ECG was interpretable and there was no significant change from baseline.       Laboratory Data:  Chemistry Recent Labs Lab 11/16/16 1225 11/16/16 2252 11/17/16 0413  NA 134*  --  140  K 3.5  --  3.0*  CL 104  --  109  CO2 21*  --  23  GLUCOSE 89  --  96  BUN 20  --  14  CREATININE 1.04* 0.99 1.01*  CALCIUM 8.8*  --  8.5*  GFRNONAA 56* 60* 58*  GFRAA >60 >60 >60  ANIONGAP 9  --  8      Recent Labs Lab 11/16/16 1225  PROT 6.5  ALBUMIN 3.5  AST 19  ALT 10*  ALKPHOS 76  BILITOT 0.5   Hematology Recent Labs Lab 11/16/16 1225 11/16/16 2252 11/17/16 0413  WBC 5.6 3.8* 3.8*  RBC 4.18 4.25 4.24  HGB 9.7* 9.8* 9.6*  HCT 31.6* 31.6* 31.7*  MCV 75.6* 74.4* 74.8*  MCH 23.2* 23.1* 22.6*  MCHC 30.7 31.0 30.3  RDW 16.8* 16.3* 16.4*  PLT 207 177 196   Cardiac Enzymes Recent Labs Lab 11/16/16 2252 11/17/16 0413 11/17/16 1208  TROPONINI <0.03 <0.03 <0.03    Recent Labs Lab 11/16/16 1241  TROPIPOC 0.00    BNPNo results for input(s): BNP, PROBNP in the last 168 hours.  DDimer No results for input(s): DDIMER in the last 168 hours.  Radiology/Studies:  Dg Chest 2 View Result Date: 11/16/2016 CLINICAL DATA:  Nausea, dizziness, syncope EXAM: CHEST  2 VIEW COMPARISON:  None. FINDINGS: The heart size and mediastinal contours are within normal limits. Both lungs are clear. The visualized skeletal structures are unremarkable. IMPRESSION: No active cardiopulmonary disease. Electronically Signed   By: Elige Ko   On: 11/16/2016 12:42   Ct Head Wo Contrast Result Date: 11/16/2016 CLINICAL DATA:  Dizziness EXAM: CT HEAD WITHOUT CONTRAST TECHNIQUE: Contiguous axial images were obtained from the base of the skull through the vertex without intravenous contrast. COMPARISON:  None. FINDINGS: Brain: No evidence of acute infarction, hemorrhage, hydrocephalus, extra-axial collection or mass lesion/mass effect. Mild cortical atrophy. Vascular: Intracranial atherosclerosis. Skull: Normal. Negative for fracture  or focal lesion. Sinuses/Orbits: The visualized paranasal sinuses are essentially clear. The mastoid air cells are unopacified. Other: None. IMPRESSION: No evidence of acute intracranial abnormality. Mild cortical atrophy. Electronically Signed   By: Charline BillsSriyesh  Krishnan M.D.   On: 11/16/2016 12:53    Assessment and Plan:   1. Sinus Bradycardia     Patient with a number of  symptoms/complaints     Not clearly attributable to her bradycardic rates     No historical EKG's  2. CP     W/u is in progress with cardiology  3. Hypokalemia     Being replaced  4. Prolonged QT     If not corrected when K+ is corrected, would remove/avoid QT prolonging drugs     No history of syncope   From EP standpoint, recommend 48 hour monitoring to start with and out patient follow up.  Signed, Sheilah PigeonRenee Lynn Ursuy, PA-C  11/17/2016 2:27 PM  I have seen and examined this patient with Francis Dowseenee Ursuy.  Agree with above, note added to reflect my findings.  On exam, RRR,no murmurs, lungs clear. Presented to the hospital with fatigue, dizziness, and chest pain. Myoview currently pending. Has had fatigue for years due to her chronic medical problems including fibromyalgia. It is unclear if her fatigue is related to bradycardia. She would like to avoid pacemaker implant if possible. Would plan for discharge today with plans for a Holter monitor to better determine her HR as an outpatient. Her ECG, personally reviewed, shows no evidence of AV conduction disease.     Aleea Hendry M. Shena Vinluan MD 11/17/2016 4:27 PM

## 2016-11-17 NOTE — Progress Notes (Signed)
   11/16/16 2253  Vitals  Temp (!) 97.5 F (36.4 C)  Temp Source Oral  BP (!) 141/58  BP Location Right Arm  BP Method Automatic  Patient Position (if appropriate) Sitting  Pulse Rate (!) 43  Pulse Rate Source Dinamap  Resp 20  Oxygen Therapy  SpO2 97 %  O2 Device Room Air  Height and Weight  Height 5\' 3"  (1.6 m)  Weight 89.5 kg (197 lb 6.4 oz)  Type of Scale Used Standing  Type of Weight Actual  BSA (Calculated - sq m) 1.99 sq meters  BMI (Calculated) 35  Weight in (lb) to have BMI = 25 140.8  Admitted pt to rm 3E08 from ED, pt alert and oriented, denied pain at this time, oriented to room, call bell placed within reach, placed on cardiac monitor, CCMD made aware.

## 2016-11-17 NOTE — Progress Notes (Signed)
LMTC.

## 2016-11-17 NOTE — Progress Notes (Signed)
Please sched EMB for pt with PMB. Order placed.

## 2016-11-18 ENCOUNTER — Encounter (HOSPITAL_COMMUNITY): Payer: Self-pay | Admitting: Radiology

## 2016-11-18 ENCOUNTER — Encounter (HOSPITAL_COMMUNITY)
Admission: EM | Disposition: A | Discharge status: Home / Self Care | Payer: Self-pay | Source: Home / Self Care | Attending: Physician Assistant

## 2016-11-18 ENCOUNTER — Observation Stay (HOSPITAL_COMMUNITY): Payer: Medicare Other

## 2016-11-18 ENCOUNTER — Other Ambulatory Visit: Payer: Self-pay | Admitting: Cardiology

## 2016-11-18 DIAGNOSIS — R079 Chest pain, unspecified: Secondary | ICD-10-CM | POA: Diagnosis not present

## 2016-11-18 DIAGNOSIS — I25811 Atherosclerosis of native coronary artery of transplanted heart without angina pectoris: Secondary | ICD-10-CM

## 2016-11-18 DIAGNOSIS — I129 Hypertensive chronic kidney disease with stage 1 through stage 4 chronic kidney disease, or unspecified chronic kidney disease: Secondary | ICD-10-CM | POA: Diagnosis not present

## 2016-11-18 DIAGNOSIS — R9431 Abnormal electrocardiogram [ECG] [EKG]: Secondary | ICD-10-CM | POA: Diagnosis not present

## 2016-11-18 DIAGNOSIS — R001 Bradycardia, unspecified: Secondary | ICD-10-CM | POA: Diagnosis not present

## 2016-11-18 DIAGNOSIS — I2510 Atherosclerotic heart disease of native coronary artery without angina pectoris: Secondary | ICD-10-CM | POA: Diagnosis not present

## 2016-11-18 DIAGNOSIS — E7800 Pure hypercholesterolemia, unspecified: Secondary | ICD-10-CM

## 2016-11-18 DIAGNOSIS — R9439 Abnormal result of other cardiovascular function study: Secondary | ICD-10-CM

## 2016-11-18 DIAGNOSIS — I2584 Coronary atherosclerosis due to calcified coronary lesion: Secondary | ICD-10-CM | POA: Diagnosis not present

## 2016-11-18 DIAGNOSIS — I25119 Atherosclerotic heart disease of native coronary artery with unspecified angina pectoris: Secondary | ICD-10-CM

## 2016-11-18 HISTORY — PX: LEFT HEART CATH AND CORONARY ANGIOGRAPHY: CATH118249

## 2016-11-18 LAB — BASIC METABOLIC PANEL
ANION GAP: 8 (ref 5–15)
Anion gap: 9 (ref 5–15)
BUN: 10 mg/dL (ref 6–20)
BUN: 10 mg/dL (ref 6–20)
CHLORIDE: 104 mmol/L (ref 101–111)
CO2: 25 mmol/L (ref 22–32)
CO2: 25 mmol/L (ref 22–32)
Calcium: 9.6 mg/dL (ref 8.9–10.3)
Calcium: 9.1 mg/dL (ref 8.9–10.3)
Chloride: 105 mmol/L (ref 101–111)
Creatinine, Ser: 1.19 mg/dL — ABNORMAL HIGH (ref 0.44–1.00)
Creatinine, Ser: 1.11 mg/dL — ABNORMAL HIGH (ref 0.44–1.00)
GFR calc Af Amer: >60 mL/min (ref >60)
GFR calc non Af Amer: 48 mL/min — ABNORMAL LOW (ref >60)
GFR calc non Af Amer: 52 mL/min — ABNORMAL LOW (ref >60)
GFR, EST AFRICAN AMERICAN: 56 mL/min — AB (ref >60)
Glucose, Bld: 88 mg/dL (ref 65–99)
Glucose, Bld: 83 mg/dL (ref 65–99)
Potassium: 4.6 mmol/L (ref 3.5–5.1)
Potassium: 4 mmol/L (ref 3.5–5.1)
Sodium: 137 mmol/L (ref 135–145)
Sodium: 139 mmol/L (ref 135–145)

## 2016-11-18 LAB — CBC
HCT: 35.2 % — ABNORMAL LOW (ref 36.0–46.0)
Hemoglobin: 10.8 g/dL — ABNORMAL LOW (ref 12.0–15.0)
MCH: 23 pg — ABNORMAL LOW (ref 26.0–34.0)
MCHC: 30.7 g/dL (ref 30.0–36.0)
MCV: 74.9 fL — ABNORMAL LOW (ref 78.0–100.0)
Platelets: 191 10*3/uL (ref 150–400)
RBC: 4.7 MIL/uL (ref 3.87–5.11)
RDW: 16.3 % — ABNORMAL HIGH (ref 11.5–15.5)
WBC: 4 10*3/uL (ref 4.0–10.5)

## 2016-11-18 LAB — HEMOGLOBIN A1C
Hgb A1c MFr Bld: 5.4 % (ref 4.8–5.6)
MEAN PLASMA GLUCOSE: 108 mg/dL

## 2016-11-18 SURGERY — LEFT HEART CATH AND CORONARY ANGIOGRAPHY
Anesthesia: LOCAL

## 2016-11-18 MED ORDER — CYCLOBENZAPRINE HCL 10 MG PO TABS: 10.0000 mg | ORAL_TABLET | Freq: Three times a day (TID) | ORAL | 2 refills | Status: DC | PRN

## 2016-11-18 MED ORDER — HEPARIN (PORCINE) IN NACL 2-0.9 UNIT/ML-% IJ SOLN
INTRAMUSCULAR | Status: AC
  Filled 2016-11-18: qty 1000

## 2016-11-18 MED ORDER — MIDAZOLAM HCL 2 MG/2ML IJ SOLN
INTRAMUSCULAR | Status: AC
  Filled 2016-11-18: qty 2

## 2016-11-18 MED ORDER — SODIUM CHLORIDE 0.9 % IV SOLN
INTRAVENOUS | Status: DC
  Administered 2016-11-18: 250 mL via INTRAVENOUS
  Administered 2016-11-18: 14:00:00 via INTRAVENOUS

## 2016-11-18 MED ORDER — FENTANYL CITRATE (PF) 100 MCG/2ML IJ SOLN
INTRAMUSCULAR | Status: DC | PRN
  Administered 2016-11-18: 25 ug via INTRAVENOUS

## 2016-11-18 MED ORDER — PANTOPRAZOLE SODIUM 40 MG PO TBEC: 40.0000 mg | DELAYED_RELEASE_TABLET | Freq: Every day | ORAL | 5 refills | Status: DC

## 2016-11-18 MED ORDER — VERAPAMIL HCL 2.5 MG/ML IV SOLN
INTRAVENOUS | Status: DC | PRN
  Administered 2016-11-18: 16:00:00 via INTRA_ARTERIAL

## 2016-11-18 MED ORDER — NITROGLYCERIN 0.4 MG SL SUBL: 0.4000 mg | SUBLINGUAL_TABLET | SUBLINGUAL | 2 refills | Status: DC | PRN

## 2016-11-18 MED ORDER — MIDAZOLAM HCL 2 MG/2ML IJ SOLN
INTRAMUSCULAR | Status: DC | PRN
Start: 2016-11-18 — End: 2016-11-18
  Administered 2016-11-18: 1 mg via INTRAVENOUS

## 2016-11-18 MED ORDER — ONDANSETRON HCL 4 MG/2ML IJ SOLN
INTRAMUSCULAR | Status: AC
  Filled 2016-11-18: qty 2

## 2016-11-18 MED ORDER — ALUM & MAG HYDROXIDE-SIMETH 200-200-20 MG/5ML PO SUSP
30.0000 mL | Freq: Four times a day (QID) | ORAL | Status: DC | PRN
  Filled 2016-11-18: qty 30

## 2016-11-18 MED ORDER — SODIUM CHLORIDE 0.9% FLUSH: 3.0000 mL | INTRAVENOUS | Status: DC | PRN

## 2016-11-18 MED ORDER — IOPAMIDOL (ISOVUE-370) INJECTION 76%
INTRAVENOUS | Status: AC
  Administered 2016-11-18: 80 mL
  Filled 2016-11-18: qty 100

## 2016-11-18 MED ORDER — ROSUVASTATIN CALCIUM 20 MG PO TABS: 20.0000 mg | ORAL_TABLET | Freq: Every day | ORAL | Status: DC

## 2016-11-18 MED ORDER — ASPIRIN 81 MG PO CHEW
CHEWABLE_TABLET | ORAL | Status: AC
  Filled 2016-11-18: qty 4

## 2016-11-18 MED ORDER — SODIUM CHLORIDE 0.9 % IV SOLN: 250.0000 mL | INTRAVENOUS | Status: DC | PRN

## 2016-11-18 MED ORDER — ROSUVASTATIN CALCIUM 20 MG PO TABS: 20.0000 mg | ORAL_TABLET | Freq: Every day | ORAL | 5 refills | Status: DC

## 2016-11-18 MED ORDER — LIDOCAINE HCL (PF) 1 % IJ SOLN
INTRAMUSCULAR | Status: DC | PRN
  Administered 2016-11-18: 2 mL

## 2016-11-18 MED ORDER — VERAPAMIL HCL 2.5 MG/ML IV SOLN
INTRAVENOUS | Status: AC
  Filled 2016-11-18: qty 2

## 2016-11-18 MED ORDER — HEPARIN SODIUM (PORCINE) 1000 UNIT/ML IJ SOLN
INTRAMUSCULAR | Status: DC | PRN
  Administered 2016-11-18: 4500 [IU] via INTRAVENOUS

## 2016-11-18 MED ORDER — HEPARIN SODIUM (PORCINE) 5000 UNIT/ML IJ SOLN: 5000.0000 [IU] | Freq: Three times a day (TID) | INTRAMUSCULAR | Status: DC

## 2016-11-18 MED ORDER — HEPARIN SODIUM (PORCINE) 1000 UNIT/ML IJ SOLN
INTRAMUSCULAR | Status: AC
  Filled 2016-11-18: qty 1

## 2016-11-18 MED ORDER — IOPAMIDOL (ISOVUE-370) INJECTION 76%
INTRAVENOUS | Status: AC
  Filled 2016-11-18: qty 100

## 2016-11-18 MED ORDER — NITROGLYCERIN 0.4 MG SL SUBL
SUBLINGUAL_TABLET | SUBLINGUAL | Status: AC
  Administered 2016-11-18: 0.8 mg via SUBLINGUAL
  Filled 2016-11-18: qty 2

## 2016-11-18 MED ORDER — ONDANSETRON HCL 4 MG/2ML IJ SOLN
INTRAMUSCULAR | Status: DC | PRN
  Administered 2016-11-18: 4 mg via INTRAVENOUS

## 2016-11-18 MED ORDER — ASPIRIN 81 MG PO CHEW
CHEWABLE_TABLET | ORAL | Status: DC | PRN
  Administered 2016-11-18: 324 mg via ORAL

## 2016-11-18 MED ORDER — IOPAMIDOL (ISOVUE-370) INJECTION 76%
INTRAVENOUS | Status: DC | PRN
  Administered 2016-11-18: 40 mL via INTRA_ARTERIAL

## 2016-11-18 MED ORDER — LIDOCAINE HCL (PF) 1 % IJ SOLN
INTRAMUSCULAR | Status: AC
  Filled 2016-11-18: qty 30

## 2016-11-18 MED ORDER — HEPARIN (PORCINE) IN NACL 2-0.9 UNIT/ML-% IJ SOLN
INTRAMUSCULAR | Status: AC | PRN
  Administered 2016-11-18: 1000 mL

## 2016-11-18 MED ORDER — SODIUM CHLORIDE 0.9% FLUSH
3.0000 mL | Freq: Two times a day (BID) | INTRAVENOUS | Status: DC
  Administered 2016-11-18: 3 mL via INTRAVENOUS

## 2016-11-18 MED ORDER — NITROGLYCERIN 0.4 MG SL SUBL
0.8000 mg | SUBLINGUAL_TABLET | SUBLINGUAL | Status: DC
  Administered 2016-11-18: 0.8 mg via SUBLINGUAL

## 2016-11-18 MED ORDER — POTASSIUM CHLORIDE CRYS ER 20 MEQ PO TBCR: 20.0000 meq | EXTENDED_RELEASE_TABLET | Freq: Every day | ORAL | 5 refills | Status: DC

## 2016-11-18 MED ORDER — SODIUM CHLORIDE 0.9 % IV SOLN: INTRAVENOUS | Status: AC

## 2016-11-18 MED ORDER — FENTANYL CITRATE (PF) 100 MCG/2ML IJ SOLN
INTRAMUSCULAR | Status: AC
  Filled 2016-11-18: qty 2

## 2016-11-18 SURGICAL SUPPLY — 10 items
CATH 5FR JL3.5 JR4 ANG PIG MP (CATHETERS) ×1 IMPLANT
DEVICE RAD COMP TR BAND LRG (VASCULAR PRODUCTS) ×1 IMPLANT
GLIDESHEATH SLEND SS 6F .021 (SHEATH) ×1 IMPLANT
GUIDEWIRE INQWIRE 1.5J.035X260 (WIRE) IMPLANT
INQWIRE 1.5J .035X260CM (WIRE) ×2
KIT HEART LEFT (KITS) ×2 IMPLANT
PACK CARDIAC CATHETERIZATION (CUSTOM PROCEDURE TRAY) ×2 IMPLANT
TRANSDUCER W/STOPCOCK (MISCELLANEOUS) ×2 IMPLANT
TUBING CIL FLEX 10 FLL-RA (TUBING) ×2 IMPLANT
WIRE HI TORQ VERSACORE-J 145CM (WIRE) ×1 IMPLANT

## 2016-11-18 NOTE — Interval H&P Note (Signed)
History and Physical Interval Note:  11/18/2016 3:23 PM  [name]  has presented today for cardiac catheterization, with the diagnosis of chest pain. The various methods of treatment have been discussed with the patient and family. After consideration of risks, benefits and other options for treatment, the patient has consented to  Procedure(s): Left Heart Cath and Coronary Angiography (N/A) as a surgical intervention .  The patient's history has been reviewed, patient examined, no change in status, stable for surgery.  I have reviewed the patient's chart and labs.  Questions were answered to the patient's satisfaction.    Cath Lab Visit (complete for each Cath Lab visit)  Clinical Evaluation Leading to the Procedure:   ACS: Yes.    Non-ACS:    Anginal Classification: CCS IV  Anti-ischemic medical therapy: No Therapy  Non-Invasive Test Results: Intermediate-risk stress test findings: cardiac mortality 1-3%/year  Prior CABG: No previous CABG  [name]

## 2016-11-18 NOTE — H&P (View-Only) (Signed)
 Progress Note  Patient Name: [name] Date of Encounter: 11/18/2016  Primary Cardiologist: Dr. Turner  Subjective   Denies any chest pain or SOB  Inpatient Medications    Scheduled Meds: . aspirin EC  81 mg Oral Daily  . estradiol  1 mg Oral Daily  . feeding supplement (ENSURE ENLIVE)  237 mL Oral BID BM  . FLUoxetine  20 mg Oral Daily  . furosemide  20 mg Oral Daily  . gabapentin  100 mg Oral TID  . heparin  5,000 Units Subcutaneous Q8H  . nitroGLYCERIN  0.8 mg Sublingual UD  . pantoprazole  40 mg Oral Daily  . potassium chloride  20 mEq Oral Daily  . pravastatin  20 mg Oral q1800   Continuous Infusions:  PRN Meds: acetaminophen, ALPRAZolam, cyclobenzaprine, HYDROcodone-acetaminophen, nitroGLYCERIN   Vital Signs    Vitals:   11/17/16 0939 11/17/16 1216 11/17/16 2211 11/18/16 0517  BP: 121/73 (!) 109/59 136/82 (!) 122/59  Pulse: (!) 44 (!) 43 (!) 48 (!) 59  Resp: 18 20 20 20  Temp:  97.6 F (36.4 C) 98.1 F (36.7 C) 98.5 F (36.9 C)  TempSrc:  Oral Oral Oral  SpO2:  94% 98% 96%  Weight:    196 lb 9.6 oz (89.2 kg)  Height:        Intake/Output Summary (Last 24 hours) at 11/18/16 1147 Last data filed at 11/18/16 1111  Gross per 24 hour  Intake                0 ml  Output             1700 ml  Net            -1700 ml   Filed Weights   11/16/16 2253 11/17/16 0540 11/18/16 0517  Weight: 197 lb 6.4 oz (89.5 kg) 197 lb 3.2 oz (89.4 kg) 196 lb 9.6 oz (89.2 kg)    Telemetry    Sinus bradycardia - Personally Reviewed  ECG    Sinus bradycardia at 54bpm - Personally Reviewed  Physical Exam   GEN: No acute distress.   Neck: No JVD Cardiac: RRR, no murmurs, rubs, or gallops.  Respiratory: Clear to auscultation bilaterally. GI: Soft, nontender, non-distended  MS: No edema; No deformity. Neuro:  Nonfocal  Psych: Normal affect   Labs    Chemistry Recent Labs Lab 11/16/16 1225 11/16/16 2252 11/17/16 0413  NA 134*  --  140  K 3.5  --   3.0*  CL 104  --  109  CO2 21*  --  23  GLUCOSE 89  --  96  BUN 20  --  14  CREATININE 1.04* 0.99 1.01*  CALCIUM 8.8*  --  8.5*  PROT 6.5  --   --   ALBUMIN 3.5  --   --   AST 19  --   --   ALT 10*  --   --   ALKPHOS 76  --   --   BILITOT 0.5  --   --   GFRNONAA 56* 60* 58*  GFRAA >60 >60 >60  ANIONGAP 9  --  8     Hematology Recent Labs Lab 11/16/16 1225 11/16/16 2252 11/17/16 0413  WBC 5.6 3.8* 3.8*  RBC 4.18 4.25 4.24  HGB 9.7* 9.8* 9.6*  HCT 31.6* 31.6* 31.7*  MCV 75.6* 74.4* 74.8*  MCH 23.2* 23.1* 22.6*  MCHC 30.7 31.0 30.3  RDW 16.8* 16.3* 16.4*  PLT 207 177 196      Cardiac Enzymes Recent Labs Lab 11/16/16 2252 11/17/16 0413 11/17/16 1208  TROPONINI <0.03 <0.03 <0.03    Recent Labs Lab 11/16/16 1241  TROPIPOC 0.00     BNPNo results for input(s): BNP, PROBNP in the last 168 hours.   DDimer No results for input(s): DDIMER in the last 168 hours.   Radiology    Dg Chest 2 View  Result Date: 11/16/2016 CLINICAL DATA:  Nausea, dizziness, syncope EXAM: CHEST  2 VIEW COMPARISON:  None. FINDINGS: The heart size and mediastinal contours are within normal limits. Both lungs are clear. The visualized skeletal structures are unremarkable. IMPRESSION: No active cardiopulmonary disease. Electronically Signed   By: Hetal  Patel   On: 11/16/2016 12:42   Ct Head Wo Contrast  Result Date: 11/16/2016 CLINICAL DATA:  Dizziness EXAM: CT HEAD WITHOUT CONTRAST TECHNIQUE: Contiguous axial images were obtained from the base of the skull through the vertex without intravenous contrast. COMPARISON:  None. FINDINGS: Brain: No evidence of acute infarction, hemorrhage, hydrocephalus, extra-axial collection or mass lesion/mass effect. Mild cortical atrophy. Vascular: Intracranial atherosclerosis. Skull: Normal. Negative for fracture or focal lesion. Sinuses/Orbits: The visualized paranasal sinuses are essentially clear. The mastoid air cells are unopacified. Other: None. IMPRESSION:  No evidence of acute intracranial abnormality. Mild cortical atrophy. Electronically Signed   By: Sriyesh  Krishnan M.D.   On: 11/16/2016 12:53   Nm Myocar Multi W/spect W/wall Motion / Ef  Result Date: 11/17/2016 CLINICAL DATA:  Chest pain with moderate risk for cardiac etiology. Symptomatic bradycardia. Hypertension and hypercholesterolemia. EXAM: MYOCARDIAL IMAGING WITH SPECT (REST AND PHARMACOLOGIC-STRESS) GATED LEFT VENTRICULAR WALL MOTION STUDY LEFT VENTRICULAR EJECTION FRACTION TECHNIQUE: Standard myocardial SPECT imaging was performed after resting intravenous injection of 10 mCi Tc-99m tetrofosmin. Subsequently, intravenous infusion of Lexiscan was performed under the supervision of the Cardiology staff. At peak effect of the drug, 30 mCi Tc-99m tetrofosmin was injected intravenously and standard myocardial SPECT imaging was performed. Quantitative gated imaging was also performed to evaluate left ventricular wall motion, and estimate left ventricular ejection fraction. COMPARISON:  None. FINDINGS: Perfusion: A moderate size area of decreased myocardial activity is seen in the mid and distal inferior wall on stress imaging. This shows reversibility on rest imaging, suspicious for inducible ischemia. Wall Motion: Normal left ventricular wall motion. No left ventricular dilation. Left Ventricular Ejection Fraction: 54 % End diastolic volume 121 ml End systolic volume 56 ml IMPRESSION: 1. Moderate reversible defect in the mid and distal inferior wall, suspicious for inducible ischemia. 2. Normal left ventricular wall motion. 3. Left ventricular ejection fraction 54% 4. Non invasive risk stratification*: Intermediate *2012 Appropriate Use Criteria for Coronary Revascularization Focused Update: J Am Coll Cardiol. 2012;59(9):857-881. http://content.onlinejacc.org/article.aspx?articleid=[email or web address] Electronically Signed   By: John  Stahl M.D.   On: 11/17/2016 17:44   Ct Coronary Morph W/cta Cor W/score W/ca W/cm  &/or Wo/cm  Addendum Date: 11/18/2016   ADDENDUM REPORT: 11/18/2016 10:35 CLINICAL DATA:  Chest pain EXAM: Cardiac CTA MEDICATIONS: Sub lingual nitro. 4mg and lopressor mg TECHNIQUE: The patient was scanned on a Siemens 192 slice scanner. Gantry rotation speed was 250 msecs. Collimation was .9mm. A 100 kV prospective scan was triggered in the ascending thoracic aorta at 140 HU's with full mA between 35-70% of the R-R interval. Average HR during the scan was bpm. The 3D data set was interpreted on a dedicated work station using MPR, MIP and VRT modes. A total of 80cc of contrast was used. FINDINGS: Non-cardiac: See separate report from [facility]. No significant findings on   limited lung and soft tissue windows. Calcium Score:  848 dense calcium in proximal / mid LAD and RCA Coronary Arteries: Right dominant with no anomalies LM: Normal LAD:  50-75% proximal and mid LAD stenosis with dense mixed plaque D1:  Small not well seen D2:  Small not well seen Circumflex: Normal OM1: Normal RCA: Dominant less than 30% calcified proximal and mid vessel disease PDA: Normal PLA:  Some calcium small vessel not well seen Distal vessels not well seen IMPRESSION: 1) Calcium score 848 98th percentile for age and sex 2) Possible hemodynamically significant stenosis in the proximal and mid LAD Peter Nishan Electronically Signed   By: Peter  Nishan M.D.   On: 11/18/2016 10:35   Result Date: 11/18/2016 EXAM: OVER-READ INTERPRETATION  CT CHEST The following report is an over-read performed by radiologist Dr. Kevin Doverof [facility] on 11/18/2016. This over-read does not include interpretation of cardiac or coronary anatomy or pathology. The coronary CTA interpretation by the cardiologist is attached. COMPARISON:  None. FINDINGS: Cardiovascular: Heart is upper limits normal in size. Ascending aorta upper limits normal in diameter at 3.6 cm. No dissection. Mediastinum/Nodes: Large hiatal hernia. No adenopathy in  the lower mediastinum or hila. Lungs/Pleura: Dependent atelectasis in the lung bases. No confluent opacities or effusions. Upper Abdomen: Imaging into the upper abdomen shows no acute findings. Musculoskeletal: Chest wall soft tissues are unremarkable. No acute bony abnormality. IMPRESSION: No acute extracardiac abnormality. Large hiatal hernia. Electronically Signed: By: Kevin  Dover M.D. On: 11/18/2016 09:41    Cardiac Studies   2D echo 11/17/2016 Study Conclusions  - Left ventricle: The cavity size was normal. Wall thickness was   normal. Systolic function was normal. The estimated ejection   fraction was in the range of 60% to 65%. - Mitral valve: There was mild regurgitation. - Left atrium: The atrium was moderately dilated.  Coronary CTA 11/18/2016 FINDINGS: Non-cardiac: See separate report from [facility]. No significant findings on limited lung and soft tissue windows.  Calcium Score:  848 dense calcium in proximal / mid LAD and RCA  Coronary Arteries: Right dominant with no anomalies  LM: Normal  LAD:  50-75% proximal and mid LAD stenosis with dense mixed plaque  D1:  Small not well seen  D2:  Small not well seen  Circumflex: Normal  OM1: Normal  RCA: Dominant less than 30% calcified proximal and mid vessel disease  PDA: Normal  PLA:  Some calcium small vessel not well seen  Distal vessels not well seen  IMPRESSION: 1) Calcium score 848 98th percentile for age and sex  2) Possible hemodynamically significant stenosis in the proximal and mid LAD   Patient Profile     60 y.o. female with no significant past cardiac history, presenting to the ED with symptomatic bradycardia  Assessment & Plan    1. Symptomatic bradycardia - appreciate EP input - symptoms of fatigue vague and HR is anywhere from 40-50bpm - will discharge out on event monitor to try to correlate symptoms with significant bradycardia - TSH normal  2.  Chest  pain/ASCAD by coronary CTA - trop negative x 3 and EKG with no ischemic changes - Lexiscan myoview showed mid and distal inferior ischemia - coronary CTA today showed very high calcium score at 848 which is 98th percentile with 50-70% LAD worrisome for hemodynamically significant lesion and 30% prox RCA.  Nuclear stress test did not show any ischemia in the LAD territory. - 2D echo with normal LVF - she says   that she has not been able to take ASA in the past due to history of PUD - she is on chronic omeprazole - I have tried to contact her GI MD regarding safety of starting ASA but he has retired. - Will plan cath today with FFR to determine if LAD is flow limiting.  - Cardiac catheterization was discussed with the patient fully. The patient understands that risks include but are not limited to stroke (1 in 1000), death (1 in 1000), kidney failure [usually temporary] (1 in 500), bleeding (1 in 200), allergic reaction [possibly serious] (1 in 200).  The patient understands and is willing to proceed.   - change lovastatin to Crestor 20mg daily - stop estrace in setting of CAD  3.  Prolonged QTc - improved after repletion of potassium - she is on several QT prolonging drugs at home.  I instructed her to stop Zanaflex as well as phenergan.   - will continue Prozac as QTc has normalized with suppl of K+ - repeat EKG today  4.  Hypokalemia - K+ repleted yesterday - check BMET today  5.  Anemia  - she has a history of PUD in the past and has problems with vomiting and blood in her emesis when she vomits a lot - she has been followed by Dr. Johnson in the past who is now retired. - I will get her in with GI upon discharge - change home omeprazole to Protonix 40mg daily.  6.  Hyperlipidemia - LDL is at goal at 46 but triglycerides are elevated at 215.  - she was on lovastatin 20mg daily at home - will change to Crestor 20mg daily to see if we can get her TAGs better - she will need an FLP and  ALT in 6 weeks  I have spent a total of 35 minutes with patient reviewing coronary CTA , telemetry, EKGs, labs and examining patient as well as establishing an assessment and plan that was discussed with the patient.  > 50% of time was spent in direct patient care.     Signed, Traci Turner, MD  11/18/2016, 11:47 AM   

## 2016-11-18 NOTE — Progress Notes (Addendum)
Progress Note  Patient Name: Kelli ButtsDeborah R Raulerson Date of Encounter: 11/18/2016  Primary Cardiologist: Dr. Mayford Knifeurner  Subjective   Denies any chest pain or SOB  Inpatient Medications    Scheduled Meds: . aspirin EC  81 mg Oral Daily  . estradiol  1 mg Oral Daily  . feeding supplement (ENSURE ENLIVE)  237 mL Oral BID BM  . FLUoxetine  20 mg Oral Daily  . furosemide  20 mg Oral Daily  . gabapentin  100 mg Oral TID  . heparin  5,000 Units Subcutaneous Q8H  . nitroGLYCERIN  0.8 mg Sublingual UD  . pantoprazole  40 mg Oral Daily  . potassium chloride  20 mEq Oral Daily  . pravastatin  20 mg Oral q1800   Continuous Infusions:  PRN Meds: acetaminophen, ALPRAZolam, cyclobenzaprine, HYDROcodone-acetaminophen, nitroGLYCERIN   Vital Signs    Vitals:   11/17/16 0939 11/17/16 1216 11/17/16 2211 11/18/16 0517  BP: 121/73 (!) 109/59 136/82 (!) 122/59  Pulse: (!) 44 (!) 43 (!) 48 (!) 59  Resp: 18 20 20 20   Temp:  97.6 F (36.4 C) 98.1 F (36.7 C) 98.5 F (36.9 C)  TempSrc:  Oral Oral Oral  SpO2:  94% 98% 96%  Weight:    196 lb 9.6 oz (89.2 kg)  Height:        Intake/Output Summary (Last 24 hours) at 11/18/16 1147 Last data filed at 11/18/16 1111  Gross per 24 hour  Intake                0 ml  Output             1700 ml  Net            -1700 ml   Filed Weights   11/16/16 2253 11/17/16 0540 11/18/16 0517  Weight: 197 lb 6.4 oz (89.5 kg) 197 lb 3.2 oz (89.4 kg) 196 lb 9.6 oz (89.2 kg)    Telemetry    Sinus bradycardia - Personally Reviewed  ECG    Sinus bradycardia at 54bpm - Personally Reviewed  Physical Exam   GEN: No acute distress.   Neck: No JVD Cardiac: RRR, no murmurs, rubs, or gallops.  Respiratory: Clear to auscultation bilaterally. GI: Soft, nontender, non-distended  MS: No edema; No deformity. Neuro:  Nonfocal  Psych: Normal affect   Labs    Chemistry Recent Labs Lab 11/16/16 1225 11/16/16 2252 11/17/16 0413  NA 134*  --  140  K 3.5  --   3.0*  CL 104  --  109  CO2 21*  --  23  GLUCOSE 89  --  96  BUN 20  --  14  CREATININE 1.04* 0.99 1.01*  CALCIUM 8.8*  --  8.5*  PROT 6.5  --   --   ALBUMIN 3.5  --   --   AST 19  --   --   ALT 10*  --   --   ALKPHOS 76  --   --   BILITOT 0.5  --   --   GFRNONAA 56* 60* 58*  GFRAA >60 >60 >60  ANIONGAP 9  --  8     Hematology Recent Labs Lab 11/16/16 1225 11/16/16 2252 11/17/16 0413  WBC 5.6 3.8* 3.8*  RBC 4.18 4.25 4.24  HGB 9.7* 9.8* 9.6*  HCT 31.6* 31.6* 31.7*  MCV 75.6* 74.4* 74.8*  MCH 23.2* 23.1* 22.6*  MCHC 30.7 31.0 30.3  RDW 16.8* 16.3* 16.4*  PLT 207 177 196  Cardiac Enzymes Recent Labs Lab 11/16/16 2252 11/17/16 0413 11/17/16 1208  TROPONINI <0.03 <0.03 <0.03    Recent Labs Lab 11/16/16 1241  TROPIPOC 0.00     BNPNo results for input(s): BNP, PROBNP in the last 168 hours.   DDimer No results for input(s): DDIMER in the last 168 hours.   Radiology    Dg Chest 2 View  Result Date: 11/16/2016 CLINICAL DATA:  Nausea, dizziness, syncope EXAM: CHEST  2 VIEW COMPARISON:  None. FINDINGS: The heart size and mediastinal contours are within normal limits. Both lungs are clear. The visualized skeletal structures are unremarkable. IMPRESSION: No active cardiopulmonary disease. Electronically Signed   By: Elige Ko   On: 11/16/2016 12:42   Ct Head Wo Contrast  Result Date: 11/16/2016 CLINICAL DATA:  Dizziness EXAM: CT HEAD WITHOUT CONTRAST TECHNIQUE: Contiguous axial images were obtained from the base of the skull through the vertex without intravenous contrast. COMPARISON:  None. FINDINGS: Brain: No evidence of acute infarction, hemorrhage, hydrocephalus, extra-axial collection or mass lesion/mass effect. Mild cortical atrophy. Vascular: Intracranial atherosclerosis. Skull: Normal. Negative for fracture or focal lesion. Sinuses/Orbits: The visualized paranasal sinuses are essentially clear. The mastoid air cells are unopacified. Other: None. IMPRESSION:  No evidence of acute intracranial abnormality. Mild cortical atrophy. Electronically Signed   By: Charline Bills M.D.   On: 11/16/2016 12:53   Nm Myocar Multi W/spect W/wall Motion / Ef  Result Date: 11/17/2016 CLINICAL DATA:  Chest pain with moderate risk for cardiac etiology. Symptomatic bradycardia. Hypertension and hypercholesterolemia. EXAM: MYOCARDIAL IMAGING WITH SPECT (REST AND PHARMACOLOGIC-STRESS) GATED LEFT VENTRICULAR WALL MOTION STUDY LEFT VENTRICULAR EJECTION FRACTION TECHNIQUE: Standard myocardial SPECT imaging was performed after resting intravenous injection of 10 mCi Tc-41m tetrofosmin. Subsequently, intravenous infusion of Lexiscan was performed under the supervision of the Cardiology staff. At peak effect of the drug, 30 mCi Tc-48m tetrofosmin was injected intravenously and standard myocardial SPECT imaging was performed. Quantitative gated imaging was also performed to evaluate left ventricular wall motion, and estimate left ventricular ejection fraction. COMPARISON:  None. FINDINGS: Perfusion: A moderate size area of decreased myocardial activity is seen in the mid and distal inferior wall on stress imaging. This shows reversibility on rest imaging, suspicious for inducible ischemia. Wall Motion: Normal left ventricular wall motion. No left ventricular dilation. Left Ventricular Ejection Fraction: 54 % End diastolic volume 121 ml End systolic volume 56 ml IMPRESSION: 1. Moderate reversible defect in the mid and distal inferior wall, suspicious for inducible ischemia. 2. Normal left ventricular wall motion. 3. Left ventricular ejection fraction 54% 4. Non invasive risk stratification*: Intermediate *2012 Appropriate Use Criteria for Coronary Revascularization Focused Update: J Am Coll Cardiol. 2012;59(9):857-881. http://content.dementiazones.com.aspx?articleid=[email or web address] Electronically Signed   By: Myles Rosenthal M.D.   On: 11/17/2016 17:44   Ct Coronary Morph W/cta Cor W/score W/ca W/cm  &/or Wo/cm  Addendum Date: 11/18/2016   ADDENDUM REPORT: 11/18/2016 10:35 CLINICAL DATA:  Chest pain EXAM: Cardiac CTA MEDICATIONS: Sub lingual nitro. 4mg  and lopressor mg TECHNIQUE: The patient was scanned on a Siemens 192 slice scanner. Gantry rotation speed was 250 msecs. Collimation was .9mm. A 100 kV prospective scan was triggered in the ascending thoracic aorta at 140 HU's with full mA between 35-70% of the R-R interval. Average HR during the scan was bpm. The 3D data set was interpreted on a dedicated work station using MPR, MIP and VRT modes. A total of 80cc of contrast was used. FINDINGS: Non-cardiac: See separate report from [facility]. No significant findings on  limited lung and soft tissue windows. Calcium Score:  848 dense calcium in proximal / mid LAD and RCA Coronary Arteries: Right dominant with no anomalies LM: Normal LAD:  50-75% proximal and mid LAD stenosis with dense mixed plaque D1:  Small not well seen D2:  Small not well seen Circumflex: Normal OM1: Normal RCA: Dominant less than 30% calcified proximal and mid vessel disease PDA: Normal PLA:  Some calcium small vessel not well seen Distal vessels not well seen IMPRESSION: 1) Calcium score 848 98th percentile for age and sex 2) Possible hemodynamically significant stenosis in the proximal and mid LAD Charlton Haws Electronically Signed   By: Charlton Haws M.D.   On: 11/18/2016 10:35   Result Date: 11/18/2016 EXAM: OVER-READ INTERPRETATION  CT CHEST The following report is an over-read performed by radiologist Dr. Noe Gens [facility] on 11/18/2016. This over-read does not include interpretation of cardiac or coronary anatomy or pathology. The coronary CTA interpretation by the cardiologist is attached. COMPARISON:  None. FINDINGS: Cardiovascular: Heart is upper limits normal in size. Ascending aorta upper limits normal in diameter at 3.6 cm. No dissection. Mediastinum/Nodes: Large hiatal hernia. No adenopathy in  the lower mediastinum or hila. Lungs/Pleura: Dependent atelectasis in the lung bases. No confluent opacities or effusions. Upper Abdomen: Imaging into the upper abdomen shows no acute findings. Musculoskeletal: Chest wall soft tissues are unremarkable. No acute bony abnormality. IMPRESSION: No acute extracardiac abnormality. Large hiatal hernia. Electronically Signed: By: Charlett Nose M.D. On: 11/18/2016 09:41    Cardiac Studies   2D echo 11/17/2016 Study Conclusions  - Left ventricle: The cavity size was normal. Wall thickness was   normal. Systolic function was normal. The estimated ejection   fraction was in the range of 60% to 65%. - Mitral valve: There was mild regurgitation. - Left atrium: The atrium was moderately dilated.  Coronary CTA 11/18/2016 FINDINGS: Non-cardiac: See separate report from [facility]. No significant findings on limited lung and soft tissue windows.  Calcium Score:  848 dense calcium in proximal / mid LAD and RCA  Coronary Arteries: Right dominant with no anomalies  LM: Normal  LAD:  50-75% proximal and mid LAD stenosis with dense mixed plaque  D1:  Small not well seen  D2:  Small not well seen  Circumflex: Normal  OM1: Normal  RCA: Dominant less than 30% calcified proximal and mid vessel disease  PDA: Normal  PLA:  Some calcium small vessel not well seen  Distal vessels not well seen  IMPRESSION: 1) Calcium score 848 98th percentile for age and sex  2) Possible hemodynamically significant stenosis in the proximal and mid LAD   Patient Profile     61 y.o. female with no significant past cardiac history, presenting to the ED with symptomatic bradycardia  Assessment & Plan    1. Symptomatic bradycardia - appreciate EP input - symptoms of fatigue vague and HR is anywhere from 40-50bpm - will discharge out on event monitor to try to correlate symptoms with significant bradycardia - TSH normal  2.  Chest  pain/ASCAD by coronary CTA - trop negative x 3 and EKG with no ischemic changes - Lexiscan myoview showed mid and distal inferior ischemia - coronary CTA today showed very high calcium score at 848 which is 98th percentile with 50-70% LAD worrisome for hemodynamically significant lesion and 30% prox RCA.  Nuclear stress test did not show any ischemia in the LAD territory. - 2D echo with normal LVF - she says  that she has not been able to take ASA in the past due to history of PUD - she is on chronic omeprazole - I have tried to contact her GI MD regarding safety of starting ASA but he has retired. - Will plan cath today with FFR to determine if LAD is flow limiting.  - Cardiac catheterization was discussed with the patient fully. The patient understands that risks include but are not limited to stroke (1 in 1000), death (1 in 1000), kidney failure [usually temporary] (1 in 500), bleeding (1 in 200), allergic reaction [possibly serious] (1 in 200).  The patient understands and is willing to proceed.   - change lovastatin to Crestor 20mg  daily - stop estrace in setting of CAD  3.  Prolonged QTc - improved after repletion of potassium - she is on several QT prolonging drugs at home.  I instructed her to stop Zanaflex as well as phenergan.   - will continue Prozac as QTc has normalized with suppl of K+ - repeat EKG today  4.  Hypokalemia - K+ repleted yesterday - check BMET today  5.  Anemia  - she has a history of PUD in the past and has problems with vomiting and blood in her emesis when she vomits a lot - she has been followed by Dr. Laural Benes in the past who is now retired. - I will get her in with GI upon discharge - change home omeprazole to Protonix 40mg  daily.  6.  Hyperlipidemia - LDL is at goal at 46 but triglycerides are elevated at 215.  - she was on lovastatin 20mg  daily at home - will change to Crestor 20mg  daily to see if we can get her TAGs better - she will need an FLP and  ALT in 6 weeks  I have spent a total of 35 minutes with patient reviewing coronary CTA , telemetry, EKGs, labs and examining patient as well as establishing an assessment and plan that was discussed with the patient.  > 50% of time was spent in direct patient care.     Signed, Armanda Magic, MD  11/18/2016, 11:47 AM

## 2016-11-18 NOTE — Progress Notes (Signed)
Patient D/C summary discussed with her and her Fiance prior to leaving. No acute distress noted. TR Band was D/C'd earlier without deviations. Dsg C/D/I with out bleeding. IV D/C as well as Tele moniter.  Black box notified of patient's discharge.

## 2016-11-18 NOTE — Progress Notes (Signed)
Pt aware. EMB scheduled.

## 2016-11-21 ENCOUNTER — Encounter (HOSPITAL_COMMUNITY): Payer: Self-pay | Admitting: Internal Medicine

## 2016-11-24 ENCOUNTER — Ambulatory Visit
Admit: 2016-11-24 | Discharge: 2016-11-24 | Payer: TRICARE (CHAMPUS) | Attending: Women's Health | Primary: Internal Medicine

## 2016-11-24 DIAGNOSIS — N950 Postmenopausal bleeding: Secondary | ICD-10-CM

## 2016-11-24 NOTE — Procedures (Signed)
Endometrial Biopsy    Subjective:     Shannon Zimmerman is a 61 y.o. African American female who presents for endometrial biopsy due to PMB.    Discussion:     Discussed reasons for performing an endometrial biopsy.  Endometrial biopsy's risks (bleeding, pain, infection, missing the abnormal area on biopsy) and post procedure precautions (no sex for at least 3 to 5 days), not uncommon to have some break through bleeding after this procedure, call with fever, chills or worsening pain, ibuprofen 800 mg po Q8 hrs PRN cramping/pain. Questions answered.    Procedure:     Paracervical block: no   NSAID p.o. prior to procedure:no     Sterile speculum placed and area prepped with hibiclens  Tenaculum used: YES  Cervical dilation necessary: NO  Uterus sounded to: 7 cm  Amount of tissue returned: scant; potentially not an adequate sampling.  Complications: none  Procedural discomfort: mild    Discharge plan:     Home with above noted recommendations.  Will call with results.

## 2016-11-24 NOTE — Procedures (Addendum)
Endometrial Biopsy    Subjective:     Syncere J File is a 60 y.o. African American female who presents for endometrial biopsy due to PMB.    Discussion:     Discussed reasons for performing an endometrial biopsy.  Endometrial biopsy's risks (bleeding, pain, infection, missing the abnormal area on biopsy) and post procedure precautions (no sex for at least 3 to 5 days), not uncommon to have some break through bleeding after this procedure, call with fever, chills or worsening pain, ibuprofen 800 mg po Q8 hrs PRN cramping/pain. Questions answered.    Procedure:     Paracervical block: no   NSAID p.o. prior to procedure:no     Sterile speculum placed and area prepped with hibiclens  Tenaculum used: YES  Cervical dilation necessary: NO  Uterus sounded to: 7 cm  Amount of tissue returned: scant; potentially not an adequate sampling.  Complications: none  Procedural discomfort: mild    Discharge plan:     Home with above noted recommendations.  Will call with results.

## 2016-11-28 ENCOUNTER — Encounter: Payer: Self-pay | Admitting: Physician Assistant

## 2016-11-28 ENCOUNTER — Ambulatory Visit (INDEPENDENT_AMBULATORY_CARE_PROVIDER_SITE_OTHER): Payer: Medicare Other | Admitting: Physician Assistant

## 2016-11-28 ENCOUNTER — Encounter (INDEPENDENT_AMBULATORY_CARE_PROVIDER_SITE_OTHER): Payer: Self-pay

## 2016-11-28 ENCOUNTER — Ambulatory Visit (INDEPENDENT_AMBULATORY_CARE_PROVIDER_SITE_OTHER): Payer: Medicare Other

## 2016-11-28 VITALS — BP 130/84 | HR 84 | Ht 63.0 in | Wt 196.4 lb

## 2016-11-28 DIAGNOSIS — R001 Bradycardia, unspecified: Secondary | ICD-10-CM

## 2016-11-28 DIAGNOSIS — R9431 Abnormal electrocardiogram [ECG] [EKG]: Secondary | ICD-10-CM

## 2016-11-28 DIAGNOSIS — E7800 Pure hypercholesterolemia, unspecified: Secondary | ICD-10-CM

## 2016-11-28 DIAGNOSIS — I25811 Atherosclerosis of native coronary artery of transplanted heart without angina pectoris: Secondary | ICD-10-CM | POA: Diagnosis not present

## 2016-11-28 NOTE — Patient Instructions (Addendum)
Medication Instructions:  Your physician recommends that you continue on your current medications as directed. Please refer to the Current Medication list given to you today.  Labwork: Your physician recommends that you have lab work today- BMET  Testing/Procedures: Your physician has recommended that you wear an cardiac event monitor. Event monitors are medical devices that record the heart's electrical activity. Doctors most often us these monitors to diagnose arrhythmias. Arrhythmias are problems with the speed or rhythm of the heartbeat. The monitor is a small, portable device. You can wear one while you do your normal daily activities. This is usually used to diagnose what is causing palpitations/syncope (passing out).  Follow-Up: Your physician recommends that you schedule a follow-up appointment in: 2 months with Dr. Mayford Knifeurner.    If you need a refill on your cardiac medications before your next appointment, please call your pharmacy.

## 2016-11-28 NOTE — Progress Notes (Signed)
Cardiology Office Note    Date:  11/28/2016   ID:  [name], DOB Apr 25, 1956, MRN [id number]  PCP:  Georgann Housekeeper, MD  Cardiologist: Dr. Mayford Knife  Chief Complaint  Patient presents with  . Follow-up    History of Present Illness:  [name] is a 61 y.o. female with no significant past cardiac history, presenting to the ED with symptomatic bradycardia., In 2005, she was admitted for chest pain and ultimately underwent a LHC, performed by Dr. Katrinka Blazing, 10/29/2003 which showed esssentially normal coronary arteries with mild plaquing in the mid left anterior descending and distal right coronary artery and normal LVF. Her chest pain at that time was ruled noncardiac.Patient also has anxiety depression and chronic pain syndrome.  Patient was admitted to the hospital with dizziness and bradycardia with heart rates in the 40s to 50s. TSH was normal, troponins negative. 2-D echo normal LVEF with mild MR and moderate LAE. Nuclear stress test showed a moderate sized area of decreased myocardial activity in the mid and distal inferior wall and stress imaging with reversibility. Coronary CTA showed high calcium score of 848 and possible hemodynamically significant stenosis in the proximal mid LAD. Left heart cath was performed 11/18/16 she was found to have mild to moderate nonobstructive CAD with 40% mid LAD, 50% diagonal, 25% mid RCA. Medical therapy was recommended. Her lovastatin was changed to Crestor. Estrace was discontinued in the setting of CAD. No aspirin given peptic ulcer disease. EKG on admission QTC was 526 ms in setting of hypokalemia. With resolution of hypokalemia her QT normalized. Prozac was continued without further prolongation of QT.CShe was seen by EP who recommended further monitoring as an outpatient with a cardiac monitor to assess degree of rated cardia and to correlate symptoms with significant bradycardia. He also recommended avoidance of QT prolonging drugs. Phenergan  and Seroquel were discontinued.   Patient comes in today for follow-up accompanied by her husband. She did not change any of her medications from the hospital because she wanted to wait to she saw her primary care at the end of the month. She is still taking her estradiol. She is nauseated and complaining of headache. Patient really didn't understand the reason for the medication changes in the hospital.   Past Medical History:  Diagnosis Date  . Anemia   . Anxiety   . Arthritis    "all over" (11/16/2016)  . Chronic lower back pain   . Chronic pain syndrome   . Chronic pain syndrome    "all over" (11/16/2016)  . CKD (chronic kidney disease), stage III   . Daily headache   . Depression   . Fibromyalgia   . GERD (gastroesophageal reflux disease)   . High cholesterol   . History of blood transfusion    "related to hand laceration"  . History of hiatal hernia   . History of stomach ulcers    "bled when I was younger" (11/16/2016)  . Hypertension    hx; "they took me off my RX" (11/16/2016)  . Migraine 1970s  . Phlebitis of leg, right    "years ago" (11/16/2016)    Past Surgical History:  Procedure Laterality Date  . ABDOMINAL HYSTERECTOMY  1993   w/left ovary  . ANKLE FRACTURE SURGERY Bilateral   . APPENDECTOMY  1977  . BACK SURGERY    . CARDIAC CATHETERIZATION  2005   "negative"  . CHOLECYSTECTOMY OPEN  1977  . ESOPHAGOGASTRODUODENOSCOPY  2008   dx'd GERD  . FOREARM  FRACTURE SURGERY Right   . HIP SURGERY Bilateral    "took out bone spurs"  . JOINT REPLACEMENT    . KNEE ARTHROSCOPY Bilateral    "total of 18 times"  . LACERATION REPAIR Right    hand; "went thru glass door"  . LEFT HEART CATH AND CORONARY ANGIOGRAPHY N/A 11/18/2016   Procedure: Left Heart Cath and Coronary Angiography;  Surgeon: Yvonne Kendall, MD;  Location: [facility];  Service: Cardiovascular;  Laterality: N/A;  . LUMBAR FUSION     "put rods in"  . LUMBAR LAMINECTOMY/DECOMPRESSION  MICRODISCECTOMY  05/2006  . REVISION TOTAL KNEE ARTHROPLASTY Bilateral   . RIGHT OOPHORECTOMY Right   . SHOULDER OPEN ROTATOR CUFF REPAIR Bilateral    left 04/2006  . TONSILLECTOMY    . TOTAL KNEE ARTHROPLASTY Bilateral    left 1998; right 2004  . TUBAL LIGATION      Current Medications: Current Meds  Medication Sig  . Acetaminophen-Caffeine (EXCEDRIN ASPIRIN FREE PO) Take 2 tablets by mouth daily as needed (pain).  Marland Kitchen ALPRAZolam (XANAX) 1 MG tablet Take 0.5 mg by mouth 2 (two) times daily.   . cyclobenzaprine (FLEXERIL) 10 MG tablet Take 1 tablet (10 mg total) by mouth 3 (three) times daily as needed for muscle spasms.  Marland Kitchen FLUoxetine (PROZAC) 20 MG capsule Take 20 mg by mouth daily.   . furosemide (LASIX) 20 MG tablet Take 20 mg by mouth daily.   Marland Kitchen gabapentin (NEURONTIN) 100 MG capsule Take 100 mg by mouth 3 (three) times daily.  Marland Kitchen HYDROcodone-acetaminophen (NORCO/VICODIN) 5-325 MG tablet Take 0.5-1 tablets by mouth See admin instructions. Pt takes half(1/2) a tablet during the day and 1 tablet at night  . meloxicam (MOBIC) 15 MG tablet Take 15 mg by mouth daily.   . nitroGLYCERIN (NITROSTAT) 0.4 MG SL tablet Place 1 tablet (0.4 mg total) under the tongue every 5 (five) minutes x 3 doses as needed for chest pain.  . pantoprazole (PROTONIX) 40 MG tablet Take 1 tablet (40 mg total) by mouth daily.  . potassium chloride SA (K-DUR,KLOR-CON) 20 MEQ tablet Take 1 tablet (20 mEq total) by mouth daily.  . ranitidine (ZANTAC) 150 MG tablet Take 150 mg by mouth 2 (two) times daily.  . rosuvastatin (CRESTOR) 20 MG tablet Take 1 tablet (20 mg total) by mouth daily at 6 PM.     Allergies:   Aspirin and Oxybutynin   Social History   Social History  . Marital status: Widowed    Spouse name: N/A  . Number of children: N/A  . Years of education: N/A   Social History Main Topics  . Smoking status: Never Smoker  . Smokeless tobacco: Never Used  . Alcohol use No  . Drug use: No  . Sexual  activity: Yes   Other Topics Concern  . None   Social History Narrative  . None     Family History:  The patient's family history includes CAD (age of onset: 44) in her father.   ROS:   Please see the history of present illness.    Review of Systems  Constitution: Negative.  HENT: Negative.   Eyes: Negative.   Cardiovascular: Negative.   Respiratory: Negative.   Hematologic/Lymphatic: Negative.   Musculoskeletal: Negative.  Negative for joint pain.  Gastrointestinal: Positive for nausea and vomiting.  Genitourinary: Negative.   Neurological: Positive for dizziness.   All other systems reviewed and are negative.   PHYSICAL EXAM:   VS:  BP 130/84 (BP  Location: Left Arm, Patient Position: Sitting, Cuff Size: Normal)   Pulse 84   Ht 5\' 3"  (1.6 m)   Wt 196 lb 6.4 oz (89.1 kg)   SpO2 96%   BMI 34.79 kg/m   Physical Exam  GEN: Well nourished, well developed,nauseated in office  Neck: no JVD, carotid bruits, or masses Cardiac:RRR; no murmurs, rubs, or gallops  Respiratory:  clear to auscultation bilaterally, normal work of breathing GI: soft, nontender, nondistended, + BS Ext: Right arm and cath site without hematoma or hemorrhage, good radial brachial pulses, lower extremities without cyanosis, clubbing, or edema, Good distal pulses bilaterally Neuro:  Alert and Oriented x 3 Psych: euthymic mood, full affect  Wt Readings from Last 3 Encounters:  11/28/16 196 lb 6.4 oz (89.1 kg)  11/18/16 196 lb 9.6 oz (89.2 kg)      Studies/Labs Reviewed:   EKG:  EKG is ordered today.  The ekg ordered today demonstrates Sinus bradycardia with QT of 506 QTC 484ms  Recent Labs: 11/16/2016: ALT 10; Magnesium 2.1; TSH 1.514 11/18/2016: BUN 10; Creatinine, Ser 1.11; Hemoglobin 10.8; Platelets 191; Potassium 4.0; Sodium 139   Lipid Panel    Component Value Date/Time   CHOL 130 11/17/2016 0413   TRIG 215 (H) 11/17/2016 0413   HDL 41 11/17/2016 0413   CHOLHDL 3.2 11/17/2016 0413    VLDL 43 (H) 11/17/2016 0413   LDLCALC 46 11/17/2016 0413    Additional studies/ records that were reviewed today include:   NST 11/17/16:   FINDINGS: Perfusion: A moderate size area of decreased myocardial activity is seen in the mid and distal inferior wall on stress imaging. This shows reversibility on rest imaging, suspicious for inducible ischemia.   Wall Motion: Normal left ventricular wall motion. No left ventricular dilation.   Left Ventricular Ejection Fraction: 54 %   End diastolic volume 121 ml   End systolic volume 56 ml   IMPRESSION: 1. Moderate reversible defect in the mid and distal inferior wall, suspicious for inducible ischemia.   2. Normal left ventricular wall motion.   3. Left ventricular ejection fraction 54%   4. Non invasive risk stratification*: Intermediate   Echocardiogram 11/17/16: Study Conclusions - Left ventricle: The cavity size was normal. Wall thickness was   normal. Systolic function was normal. The estimated ejection   fraction was in the range of 60% to 65%. - Mitral valve: There was mild regurgitation. - Left atrium: The atrium was moderately dilated.   LHC 11/18/16 Procedures    Left Heart Cath and Coronary Angiography  Conclusion    Conclusions: 1. Mild to moderate, nonobstructive coronary artery disease as detailed below. 2. Normal left ventricular filling pressure.   Recommendations: 1. Medical therapy and risk factor modification to prevent progression of coronary artery disease. 2. We will discontinue standing furosemide, given the low normal left ventricular filling pressure in the setting of normal LV systolic function.         ASSESSMENT:    1. Prolonged QT interval   2. Bradycardia   3. Coronary artery disease involving native artery of transplanted heart without angina pectoris   4. Pure hypercholesterolemia      PLAN:  In order of problems listed above:  Prolonged QT interval in the hospital felt  secondary to hypokalemia and medications. Patient continued taking Phenergan which can affect the QT. She does not take potassium when she takes her Lasix which she is advised to do. Recommend stopping Phenergan and will recheck potassium today.  Bradycardia  at 55 bpm stable. EP recommended an event monitor to correlate degree of bradycardia to her symptoms, which will be placed today.  CAD on cath nonobstructive with 40% mid LAD, 50% diagonal, 25% proximal mid RCA. recommend the patient begin Crestor 20 mg daily as prescribed in the hospital. She says she will do this. They also recommended she stop her Estrace. I asked her to contact her gynecologist concerning this.    Medication Adjustments/Labs and Tests Ordered: Current medicines are reviewed at length with the patient today.  Concerns regarding medicines are outlined above.  Medication changes, Labs and Tests ordered today are listed in the Patient Instructions below. Patient Instructions  Medication Instructions:  Your physician recommends that you continue on your current medications as directed. Please refer to the Current Medication list given to you today.  Labwork: Your physician recommends that you have lab work today- BMET  Testing/Procedures: Your physician has recommended that you wear an cardiac event monitor. Event monitors are medical devices that record the heart's electrical activity. Doctors most often [place] these monitors to diagnose arrhythmias. Arrhythmias are problems with the speed or rhythm of the heartbeat. The monitor is a small, portable device. You can wear one while you do your normal daily activities. This is usually used to diagnose what is causing palpitations/syncope (passing out).  Follow-Up: Your physician recommends that you schedule a follow-up appointment in: 2 months with Dr. Mayford Knife.    If you need a refill on your cardiac medications before your next appointment, please call your pharmacy.       Signed, Jacolyn Reedy, PA-C  11/28/2016 2:17 PM    [facility] [address] [place], [place], [place]  [address] Phone: [phone]; Fax: [phone]

## 2016-11-29 LAB — BASIC METABOLIC PANEL
BUN/Creatinine Ratio: 16 (ref 12–28)
BUN: 21 mg/dL (ref 8–27)
CALCIUM: 9.4 mg/dL (ref 8.7–10.3)
CHLORIDE: 103 mmol/L (ref 96–106)
CO2: 23 mmol/L (ref 20–29)
Creatinine, Ser: 1.3 mg/dL — ABNORMAL HIGH (ref 0.57–1.00)
GFR calc Af Amer: 51 mL/min/{1.73_m2} — ABNORMAL LOW (ref >59)
GFR calc non Af Amer: 44 mL/min/{1.73_m2} — ABNORMAL LOW (ref >59)
GLUCOSE: 110 mg/dL — AB (ref 65–99)
Potassium: 4 mmol/L (ref 3.5–5.2)
Sodium: 142 mmol/L (ref 134–144)

## 2016-11-30 LAB — SURGICAL PATHOLOGY

## 2016-12-06 NOTE — Progress Notes (Signed)
Pt aware. SHG scheduled 12/30/16.

## 2016-12-06 NOTE — Progress Notes (Signed)
LMTC.

## 2016-12-06 NOTE — Progress Notes (Signed)
Please call pt and notify that her EMB was negative. Recommend SHG for further evaluation. Order placed.

## 2016-12-06 NOTE — Addendum Note (Signed)
Addended by: Buel ReamNELSON, Alaiya Martindelcampo on: 12/06/2016 02:42 PM      Modules accepted: Orders

## 2016-12-30 ENCOUNTER — Encounter: Attending: Obstetrics & Gynecology | Primary: Internal Medicine

## 2017-01-11 ENCOUNTER — Telehealth

## 2017-01-11 MED ORDER — DIAZEPAM 10 MG TAB
10 mg | ORAL_TABLET | Freq: Once | ORAL | 0 refills | Status: AC
Start: 2017-01-11 — End: 2017-01-11

## 2017-01-11 NOTE — Telephone Encounter (Signed)
Pt have her hsg appt 01-13-17 and she want to know if she can have valium rx for this appt. Pt aware that she will need someone to bring her and the need for her to sign her consent prior to taking her medicine if it is approved.

## 2017-01-11 NOTE — Telephone Encounter (Signed)
LM that prescription can be written and picked up tomorrow as it can't be sent in.

## 2017-01-11 NOTE — Telephone Encounter (Signed)
Rx written and signed.

## 2017-01-13 ENCOUNTER — Ambulatory Visit
Admit: 2017-01-13 | Discharge: 2017-01-13 | Payer: TRICARE (CHAMPUS) | Attending: Obstetrics & Gynecology | Primary: Internal Medicine

## 2017-01-13 DIAGNOSIS — N950 Postmenopausal bleeding: Secondary | ICD-10-CM

## 2017-01-13 NOTE — Procedures (Signed)
SHG:  ??  ??  Discussed the procedure and risks of the procedure including acute and chronic pain, bleeding, infection, uterine perforation, cervical stenosis stopping the procedure.  Proceeded after questions were answered.  Vaginal exam performed  Sterile speculum placed.  Area prepped with Betadine.  Cervix did not need to be dilated with the teflon cervical dilator and then IUI catheter was inserted.  Approximately 3 mL of sterile normal saline were used.  Procedure tolerated well.  When done all instruments were removed.

## 2017-01-13 NOTE — Progress Notes (Signed)
SHG without any focal abnormalities.  Patient is aware.  She is to call if she has any further vaginal bleeding.  We will proceed with a D and C.  Husband present for discussion.

## 2017-01-13 NOTE — Procedures (Signed)
SHG:  ??  ??  Discussed the procedure and risks of the procedure including acute and chronic pain, bleeding, infection, uterine perforation, cervical stenosis stopping the procedure.  Proceeded after questions were answered.  Vaginal exam performed  Sterile speculum placed.  Area prepped with Betadine.  Cervix did not need to be dilated with the teflon cervical dilator and then IUI catheter was inserted.  Approximately 3 mL of sterile normal saline were used.  Procedure tolerated well.  When done all instruments were removed.

## 2017-01-14 NOTE — Progress Notes (Signed)
SHG performed.  No evidence of focal abnormality.  Endometrial thickness 2.1 mm.  Patient is aware of results.

## 2017-01-23 NOTE — Telephone Encounter (Signed)
PT WOULD LIKE TO KNOW THE RESULTS FROM HER SHG

## 2017-01-23 NOTE — Telephone Encounter (Signed)
LMTC re SHG results.

## 2017-01-26 NOTE — Telephone Encounter (Signed)
Pt returned call and SHG results reviewed.

## 2017-02-15 DIAGNOSIS — I495 Sick sinus syndrome: Secondary | ICD-10-CM | POA: Insufficient documentation

## 2017-02-16 ENCOUNTER — Ambulatory Visit: Payer: Medicare Other | Admitting: Interventional Cardiology

## 2017-02-16 ENCOUNTER — Telehealth: Payer: Self-pay | Admitting: Cardiology

## 2017-02-16 NOTE — Telephone Encounter (Signed)
New message    Pt is calling she wants to switch providers from Dr. Mayford Knife to Dr. Katrinka Blazing. She said Dr. Ceasar Mons office wants her to see Dr. Katrinka Blazing and he is who she has seen in the past. Is this ok?

## 2017-02-16 NOTE — Telephone Encounter (Signed)
Ok if Dr. Mayford Knife agrees.

## 2017-02-17 ENCOUNTER — Ambulatory Visit: Payer: Medicare Other | Admitting: Cardiology

## 2017-02-17 NOTE — Telephone Encounter (Signed)
im fine with that 

## 2017-02-17 NOTE — Telephone Encounter (Signed)
Will send this message to Dr Michaelle Copas scheduler Anola Gurney, to follow-up with the pt and arrange an appt to see Dr Katrinka Blazing.

## 2017-03-20 ENCOUNTER — Encounter: Payer: Self-pay | Admitting: Interventional Cardiology

## 2017-04-02 NOTE — Progress Notes (Deleted)
Cardiology Office Note    Date:  04/02/2017   ID:  [name], DOB July 16, 1955, MRN [id number]  PCP:  Georgann Housekeeper, MD  Cardiologist: Lesleigh Noe, MD   No chief complaint on file.   History of Present Illness:  [name] is a 61 y.o. female patient of Dr. Mayford Knife.    Past Medical History:  Diagnosis Date  . Anemia   . Anxiety   . Arthritis    "all over" (11/16/2016)  . Chronic lower back pain   . Chronic pain syndrome   . Chronic pain syndrome    "all over" (11/16/2016)  . CKD (chronic kidney disease), stage III (HCC)   . Daily headache   . Depression   . Fibromyalgia   . GERD (gastroesophageal reflux disease)   . High cholesterol   . History of blood transfusion    "related to hand laceration"  . History of hiatal hernia   . History of stomach ulcers    "bled when I was younger" (11/16/2016)  . Hypertension    hx; "they took me off my RX" (11/16/2016)  . Migraine 1970s  . Phlebitis of leg, right    "years ago" (11/16/2016)    Past Surgical History:  Procedure Laterality Date  . ABDOMINAL HYSTERECTOMY  1993   w/left ovary  . ANKLE FRACTURE SURGERY Bilateral   . APPENDECTOMY  1977  . BACK SURGERY    . CARDIAC CATHETERIZATION  2005   "negative"  . CHOLECYSTECTOMY OPEN  1977  . ESOPHAGOGASTRODUODENOSCOPY  2008   dx'd GERD  . FOREARM FRACTURE SURGERY Right   . HIP SURGERY Bilateral    "took out bone spurs"  . JOINT REPLACEMENT    . KNEE ARTHROSCOPY Bilateral    "total of 18 times"  . LACERATION REPAIR Right    hand; "went thru glass door"  . LEFT HEART CATH AND CORONARY ANGIOGRAPHY N/A 11/18/2016   Procedure: Left Heart Cath and Coronary Angiography;  Surgeon: Yvonne Kendall, MD;  Location: [facility];  Service: Cardiovascular;  Laterality: N/A;  . LUMBAR FUSION     "put rods in"  . LUMBAR LAMINECTOMY/DECOMPRESSION MICRODISCECTOMY  05/2006  . REVISION TOTAL KNEE ARTHROPLASTY Bilateral   . RIGHT OOPHORECTOMY Right   .  SHOULDER OPEN ROTATOR CUFF REPAIR Bilateral    left 04/2006  . TONSILLECTOMY    . TOTAL KNEE ARTHROPLASTY Bilateral    left 1998; right 2004  . TUBAL LIGATION      Current Medications: Outpatient Medications Prior to Visit  Medication Sig Dispense Refill  . Acetaminophen-Caffeine (EXCEDRIN ASPIRIN FREE PO) Take 2 tablets by mouth daily as needed (pain).    Marland Kitchen ALPRAZolam (XANAX) 1 MG tablet Take 0.5 mg by mouth 2 (two) times daily.     . cyclobenzaprine (FLEXERIL) 10 MG tablet Take 1 tablet (10 mg total) by mouth 3 (three) times daily as needed for muscle spasms. 90 tablet 2  . FLUoxetine (PROZAC) 20 MG capsule Take 20 mg by mouth daily.     . furosemide (LASIX) 20 MG tablet Take 20 mg by mouth daily.     Marland Kitchen gabapentin (NEURONTIN) 100 MG capsule Take 100 mg by mouth 3 (three) times daily.    Marland Kitchen HYDROcodone-acetaminophen (NORCO/VICODIN) 5-325 MG tablet Take 0.5-1 tablets by mouth See admin instructions. Pt takes half(1/2) a tablet during the day and 1 tablet at night    . meloxicam (MOBIC) 15 MG tablet Take 15 mg by mouth daily.     Marland Kitchen  nitroGLYCERIN (NITROSTAT) 0.4 MG SL tablet Place 1 tablet (0.4 mg total) under the tongue every 5 (five) minutes x 3 doses as needed for chest pain. 25 tablet 2  . pantoprazole (PROTONIX) 40 MG tablet Take 1 tablet (40 mg total) by mouth daily. 30 tablet 5  . potassium chloride SA (K-DUR,KLOR-CON) 20 MEQ tablet Take 1 tablet (20 mEq total) by mouth daily. 30 tablet 5  . ranitidine (ZANTAC) 150 MG tablet Take 150 mg by mouth 2 (two) times daily.    . rosuvastatin (CRESTOR) 20 MG tablet Take 1 tablet (20 mg total) by mouth daily at 6 PM. 30 tablet 5   No facility-administered medications prior to visit.      Allergies:   Aspirin and Oxybutynin   Social History   Socioeconomic History  . Marital status: Widowed    Spouse name: Not on file  . Number of children: Not on file  . Years of education: Not on file  . Highest education level: Not on file  Social  Needs  . Financial resource strain: Not on file  . Food insecurity - worry: Not on file  . Food insecurity - inability: Not on file  . Transportation needs - medical: Not on file  . Transportation needs - non-medical: Not on file  Occupational History  . Not on file  Tobacco Use  . Smoking status: Never Smoker  . Smokeless tobacco: Never Used  Substance and Sexual Activity  . Alcohol use: No  . Drug use: No  . Sexual activity: Yes  Other Topics Concern  . Not on file  Social History Narrative  . Not on file     Family History:  The patient's ***family history includes CAD (age of onset: 4240) in her father.   ROS:   Please see the history of present illness.    ***  All other systems reviewed and are negative.   PHYSICAL EXAM:   VS:  There were no vitals taken for this visit.   GEN: Well nourished, well developed, in no acute distress  HEENT: normal  Neck: no JVD, carotid bruits, or masses Cardiac: ***RRR; no murmurs, rubs, or gallops,no edema  Respiratory:  clear to auscultation bilaterally, normal work of breathing GI: soft, nontender, nondistended, + BS MS: no deformity or atrophy  Skin: warm and dry, no rash Neuro:  Alert and Oriented x 3, Strength and sensation are intact Psych: euthymic mood, full affect  Wt Readings from Last 3 Encounters:  11/28/16 196 lb 6.4 oz (89.1 kg)  11/18/16 196 lb 9.6 oz (89.2 kg)      Studies/Labs Reviewed:   EKG:  EKG  ***  Recent Labs: 11/16/2016: ALT 10; Magnesium 2.1; TSH 1.514 11/18/2016: Hemoglobin 10.8; Platelets 191 11/28/2016: BUN 21; Creatinine, Ser 1.30; Potassium 4.0; Sodium 142   Lipid Panel    Component Value Date/Time   CHOL 130 11/17/2016 0413   TRIG 215 (H) 11/17/2016 0413   HDL 41 11/17/2016 0413   CHOLHDL 3.2 11/17/2016 0413   VLDL 43 (H) 11/17/2016 0413   LDLCALC 46 11/17/2016 0413    Additional studies/ records that were reviewed today include:  ***    ASSESSMENT:    1. Tachycardia-bradycardia  syndrome (HCC)   2. Bradycardia   3. Chest pain with moderate risk for cardiac etiology   4. Prolonged QT interval      PLAN:  In order of problems listed above:  1. ***    Medication Adjustments/Labs and Tests Ordered: Current  medicines are reviewed at length with the patient today.  Concerns regarding medicines are outlined above.  Medication changes, Labs and Tests ordered today are listed in the Patient Instructions below. There are no Patient Instructions on file for this visit.   Signed, Lesleigh NoeHenry W Smith III, MD  04/02/2017 7:58 PM    [facility] [address], [place], [place]  [address] Phone: [phone]; Fax: [phone]

## 2017-04-03 ENCOUNTER — Ambulatory Visit: Payer: Medicare Other | Admitting: Interventional Cardiology

## 2017-04-10 ENCOUNTER — Ambulatory Visit: Payer: Medicare Other | Admitting: Interventional Cardiology

## 2017-04-27 DIAGNOSIS — M47816 Spondylosis without myelopathy or radiculopathy, lumbar region: Secondary | ICD-10-CM | POA: Insufficient documentation

## 2017-04-27 DIAGNOSIS — M5412 Radiculopathy, cervical region: Secondary | ICD-10-CM | POA: Insufficient documentation

## 2017-04-28 ENCOUNTER — Ambulatory Visit
Admission: RE | Admit: 2017-04-28 | Discharge: 2017-04-28 | Disposition: A | Discharge status: Home / Self Care | Payer: Medicare Other | Source: Ambulatory Visit | Attending: Nurse Practitioner | Admitting: Nurse Practitioner

## 2017-04-28 ENCOUNTER — Other Ambulatory Visit: Payer: Self-pay | Admitting: Nurse Practitioner

## 2017-04-28 DIAGNOSIS — M542 Cervicalgia: Secondary | ICD-10-CM

## 2017-04-28 DIAGNOSIS — M545 Low back pain, unspecified: Secondary | ICD-10-CM

## 2017-04-28 DIAGNOSIS — M546 Pain in thoracic spine: Secondary | ICD-10-CM

## 2017-05-08 ENCOUNTER — Other Ambulatory Visit: Payer: Self-pay | Admitting: Nurse Practitioner

## 2017-05-08 DIAGNOSIS — M5412 Radiculopathy, cervical region: Secondary | ICD-10-CM

## 2017-05-17 ENCOUNTER — Other Ambulatory Visit: Payer: Self-pay | Admitting: Orthopedic Surgery

## 2017-05-17 ENCOUNTER — Ambulatory Visit
Admission: RE | Admit: 2017-05-17 | Discharge: 2017-05-17 | Disposition: A | Discharge status: Home / Self Care | Payer: Medicare Other | Source: Ambulatory Visit | Attending: Nurse Practitioner | Admitting: Nurse Practitioner

## 2017-05-17 ENCOUNTER — Ambulatory Visit
Admission: RE | Admit: 2017-05-17 | Discharge: 2017-05-17 | Disposition: A | Discharge status: Home / Self Care | Payer: Medicare Other | Source: Ambulatory Visit | Attending: Orthopedic Surgery | Admitting: Orthopedic Surgery

## 2017-05-17 DIAGNOSIS — M5412 Radiculopathy, cervical region: Secondary | ICD-10-CM

## 2017-05-17 DIAGNOSIS — M25512 Pain in left shoulder: Secondary | ICD-10-CM

## 2017-05-19 ENCOUNTER — Encounter (INDEPENDENT_AMBULATORY_CARE_PROVIDER_SITE_OTHER): Payer: Self-pay

## 2017-05-19 ENCOUNTER — Ambulatory Visit: Payer: Medicare Other | Admitting: Interventional Cardiology

## 2017-05-19 ENCOUNTER — Encounter: Payer: Self-pay | Admitting: Interventional Cardiology

## 2017-05-19 VITALS — BP 140/86 | HR 71 | Ht 63.0 in | Wt 215.2 lb

## 2017-05-19 DIAGNOSIS — I25119 Atherosclerotic heart disease of native coronary artery with unspecified angina pectoris: Secondary | ICD-10-CM | POA: Diagnosis not present

## 2017-05-19 DIAGNOSIS — R079 Chest pain, unspecified: Secondary | ICD-10-CM | POA: Diagnosis not present

## 2017-05-19 DIAGNOSIS — E7800 Pure hypercholesterolemia, unspecified: Secondary | ICD-10-CM | POA: Diagnosis not present

## 2017-05-19 DIAGNOSIS — I495 Sick sinus syndrome: Secondary | ICD-10-CM

## 2017-05-19 DIAGNOSIS — R9431 Abnormal electrocardiogram [ECG] [EKG]: Secondary | ICD-10-CM

## 2017-05-19 NOTE — Patient Instructions (Signed)
Medication Instructions:  Your physician recommends that you continue on your current medications as directed. Please refer to the Current Medication list given to you today.   Labwork: None   Testing/Procedures: None   Follow-Up: Your physician recommends that you  follow-up with your Primary Care Provider.   Any Other Special Instructions Will Be Listed Below (If Applicable).     If you need a refill on your cardiac medications before your next appointment, please call your pharmacy.

## 2017-05-19 NOTE — Progress Notes (Signed)
Cardiology Office Note    Date:  05/19/2017   ID:  [name], DOB 11-14-1955, MRN [id number]  PCP:  Georgann Housekeeper, MD  Cardiologist: Lesleigh Noe, MD   Chief Complaint  Patient presents with  . Chest Pain    History of Present Illness:  [name] is a 62 y.o. female with history of obstructive coronary artery disease, recurrent chest pain, and prolonged QTC.  QT prolongation was felt to be secondary to hypokalemia.  Returns today for clinical follow-up  She is doing well.  She denies chest pain, palpitations, edema, syncope, and difficulty with breathing.  Past Medical History:  Diagnosis Date  . Anemia   . Anxiety   . Arthritis    "all over" (11/16/2016)  . Chronic lower back pain   . Chronic pain syndrome   . Chronic pain syndrome    "all over" (11/16/2016)  . CKD (chronic kidney disease), stage III (HCC)   . Daily headache   . Depression   . Fibromyalgia   . GERD (gastroesophageal reflux disease)   . High cholesterol   . History of blood transfusion    "related to hand laceration"  . History of hiatal hernia   . History of stomach ulcers    "bled when I was younger" (11/16/2016)  . Hypertension    hx; "they took me off my RX" (11/16/2016)  . Migraine 1970s  . Phlebitis of leg, right    "years ago" (11/16/2016)    Past Surgical History:  Procedure Laterality Date  . ABDOMINAL HYSTERECTOMY  1993   w/left ovary  . ANKLE FRACTURE SURGERY Bilateral   . APPENDECTOMY  1977  . BACK SURGERY    . CARDIAC CATHETERIZATION  2005   "negative"  . CHOLECYSTECTOMY OPEN  1977  . ESOPHAGOGASTRODUODENOSCOPY  2008   dx'd GERD  . FOREARM FRACTURE SURGERY Right   . HIP SURGERY Bilateral    "took out bone spurs"  . JOINT REPLACEMENT    . KNEE ARTHROSCOPY Bilateral    "total of 18 times"  . LACERATION REPAIR Right    hand; "went thru glass door"  . LEFT HEART CATH AND CORONARY ANGIOGRAPHY N/A 11/18/2016   Procedure: Left Heart Cath and Coronary  Angiography;  Surgeon: Yvonne Kendall, MD;  Location: [facility];  Service: Cardiovascular;  Laterality: N/A;  . LUMBAR FUSION     "put rods in"  . LUMBAR LAMINECTOMY/DECOMPRESSION MICRODISCECTOMY  05/2006  . REVISION TOTAL KNEE ARTHROPLASTY Bilateral   . RIGHT OOPHORECTOMY Right   . SHOULDER OPEN ROTATOR CUFF REPAIR Bilateral    left 04/2006  . TONSILLECTOMY    . TOTAL KNEE ARTHROPLASTY Bilateral    left 1998; right 2004  . TUBAL LIGATION      Current Medications: Outpatient Medications Prior to Visit  Medication Sig Dispense Refill  . Acetaminophen-Caffeine (EXCEDRIN ASPIRIN FREE PO) Take 2 tablets by mouth 3 (three) times daily as needed (pain).     Marland Kitchen ALPRAZolam (XANAX) 1 MG tablet Take 0.5 mg by mouth 3 (three) times daily as needed for anxiety.     . Alum & Mag Hydroxide-Simeth (MAALOX ADVANCED PO) Take as directed per bottle as needed for acid reflux.    . cyclobenzaprine (FLEXERIL) 10 MG tablet Take 10 mg by mouth 2 (two) times daily as needed for muscle spasms.    Marland Kitchen FLUoxetine (PROZAC) 20 MG capsule Take 60 mg by mouth daily.     . furosemide (LASIX) 20 MG  tablet Take 10 mg to 20 mg by mouth daily as needed for swelling.    . gabapentin (NEURONTIN) 100 MG capsule Take 200 mg by mouth 3 (three) times daily.     Marland Kitchen. HYDROcodone-acetaminophen (NORCO/VICODIN) 5-325 MG tablet Take 1 tablet by mouth 3 (three) times daily.     Marland Kitchen. lisinopril (PRINIVIL,ZESTRIL) 5 MG tablet Take 5 mg by mouth daily.    . meloxicam (MOBIC) 15 MG tablet Take 15 mg by mouth daily.     . nitroGLYCERIN (NITROSTAT) 0.4 MG SL tablet Place 1 tablet (0.4 mg total) under the tongue every 5 (five) minutes x 3 doses as needed for chest pain. 25 tablet 2  . omeprazole (PRILOSEC) 20 MG capsule Take 20 mg by mouth 2 (two) times daily.    . promethazine (PHENERGAN) 25 MG tablet Take half (1/2) to one (1) tablet by mouth three (3) times daily as needed for nausea/vomitting.    . ranitidine (ZANTAC) 150 MG tablet  Take 150 mg by mouth daily.     . rosuvastatin (CRESTOR) 20 MG tablet Take 1 tablet (20 mg total) by mouth daily at 6 PM. 30 tablet 5  . cyclobenzaprine (FLEXERIL) 10 MG tablet Take 1 tablet (10 mg total) by mouth 3 (three) times daily as needed for muscle spasms. (Patient not taking: Reported on 05/19/2017) 90 tablet 2  . pantoprazole (PROTONIX) 40 MG tablet Take 1 tablet (40 mg total) by mouth daily. (Patient not taking: Reported on 05/19/2017) 30 tablet 5  . potassium chloride SA (K-DUR,KLOR-CON) 20 MEQ tablet Take 1 tablet (20 mEq total) by mouth daily. (Patient not taking: Reported on 05/19/2017) 30 tablet 5   No facility-administered medications prior to visit.      Allergies:   Aspirin and Oxybutynin   Social History   Socioeconomic History  . Marital status: Widowed    Spouse name: None  . Number of children: None  . Years of education: None  . Highest education level: None  Social Needs  . Financial resource strain: None  . Food insecurity - worry: None  . Food insecurity - inability: None  . Transportation needs - medical: None  . Transportation needs - non-medical: None  Occupational History  . None  Tobacco Use  . Smoking status: Never Smoker  . Smokeless tobacco: Never Used  Substance and Sexual Activity  . Alcohol use: No  . Drug use: No  . Sexual activity: Yes  Other Topics Concern  . None  Social History Narrative  . None     Family History:  The patient's family history includes CAD (age of onset: 7040) in her father; COPD in her mother; Diabetes in her sister; Emphysema in her mother; Lung cancer in her father; Other in her sister.   ROS:   Please see the history of present illness.    Hands and feet feel numb.  She has had some falls. All other systems reviewed and are negative.   PHYSICAL EXAM:   VS:  BP 140/86   Pulse 71   Ht 5\' 3"  (1.6 m)   Wt 215 lb 3.2 oz (97.6 kg)   BMI 38.12 kg/m    GEN: Well nourished, well developed, in no acute distress.   Obese. HEENT: normal  Neck: no JVD, carotid bruits, or masses Cardiac: RRR; no murmurs, rubs, or gallops,no edema  Respiratory:  clear to auscultation bilaterally, normal work of breathing GI: soft, nontender, nondistended, + BS MS: no deformity or atrophy  Skin: warm  and dry, no rash Neuro:  Alert and Oriented x 3, Strength and sensation are intact Psych: euthymic mood, full affect  Wt Readings from Last 3 Encounters:  05/19/17 215 lb 3.2 oz (97.6 kg)  11/28/16 196 lb 6.4 oz (89.1 kg)  11/18/16 196 lb 9.6 oz (89.2 kg)      Studies/Labs Reviewed:   EKG:  EKG  Not repeated  Recent Labs: 11/16/2016: ALT 10; Magnesium 2.1; TSH 1.514 11/18/2016: Hemoglobin 10.8; Platelets 191 11/28/2016: BUN 21; Creatinine, Ser 1.30; Potassium 4.0; Sodium 142   Lipid Panel    Component Value Date/Time   CHOL 130 11/17/2016 0413   TRIG 215 (H) 11/17/2016 0413   HDL 41 11/17/2016 0413   CHOLHDL 3.2 11/17/2016 0413   VLDL 43 (H) 11/17/2016 0413   LDLCALC 46 11/17/2016 0413    Additional studies/ records that were reviewed today include:  Continuous monitor performed on November 28, 2016: Study Highlights    Normal sinus rhythm and sinus tachcyardia with average heart rate 73bpm. The heart rate ranged from 62 to 110bpm.   Cardiac catheterization 11/18/2016: Conclusion   Conclusions: 1. Mild to moderate, nonobstructive coronary artery disease as detailed below. 2. Normal left ventricular filling pressure.   Recommendations: 1. Medical therapy and risk factor modification to prevent progression of coronary artery disease. 2. We will discontinue standing furosemide, given the low normal left ventricular filling pressure in the setting of normal LV systolic function.     ASSESSMENT:    1. Coronary artery disease involving native coronary artery of native heart with angina pectoris (HCC)   2. Tachycardia-bradycardia syndrome (HCC)   3. Pure hypercholesterolemia   4. Prolonged QT interval     5. Chest pain with moderate risk for cardiac etiology      PLAN:  In order of problems listed above:  1. No obstructive coronary disease has been identified.  This problem will be removed from the problem list. 2. Normal 30-day monitor.  This problem will be removed from the problem list. 3. Managed by PCP. 4. Not a current clinical issue. 5. Chest pain is likely related to musculoskeletal causes.   Clinical follow-up as needed with cardiology.  Management per primary care going forward.   Medication Adjustments/Labs and Tests Ordered: Current medicines are reviewed at length with the patient today.  Concerns regarding medicines are outlined above.  Medication changes, Labs and Tests ordered today are listed in the Patient Instructions below. There are no Patient Instructions on file for this visit.   Signed, Lesleigh Noe, MD  05/19/2017 2:52 PM    [facility] [address] [place], [place], [place]  [address] Phone: [phone]; Fax: [phone]

## 2017-05-25 ENCOUNTER — Other Ambulatory Visit: Payer: Self-pay | Admitting: Nurse Practitioner

## 2017-05-25 DIAGNOSIS — M5412 Radiculopathy, cervical region: Secondary | ICD-10-CM

## 2017-06-05 ENCOUNTER — Ambulatory Visit
Admission: RE | Admit: 2017-06-05 | Discharge: 2017-06-05 | Disposition: A | Discharge status: Home / Self Care | Payer: Medicare Other | Source: Ambulatory Visit | Attending: Nurse Practitioner | Admitting: Nurse Practitioner

## 2017-06-05 DIAGNOSIS — M5412 Radiculopathy, cervical region: Secondary | ICD-10-CM

## 2017-06-05 MED ORDER — TRIAMCINOLONE ACETONIDE 40 MG/ML IJ SUSP (RADIOLOGY)
60.0000 mg | Freq: Once | INTRAMUSCULAR | Status: AC
  Administered 2017-06-05: 60 mg via EPIDURAL

## 2017-06-05 MED ORDER — IOPAMIDOL (ISOVUE-M 300) INJECTION 61%
1.0000 mL | Freq: Once | INTRAMUSCULAR | Status: AC | PRN
  Administered 2017-06-05: 1 mL via EPIDURAL

## 2017-06-05 NOTE — Discharge Instructions (Signed)

## 2017-06-13 ENCOUNTER — Other Ambulatory Visit: Payer: Self-pay | Admitting: Nurse Practitioner

## 2017-06-13 DIAGNOSIS — M5412 Radiculopathy, cervical region: Secondary | ICD-10-CM

## 2017-06-21 ENCOUNTER — Other Ambulatory Visit: Payer: Medicare Other

## 2017-06-22 ENCOUNTER — Ambulatory Visit
Admission: RE | Admit: 2017-06-22 | Discharge: 2017-06-22 | Disposition: A | Discharge status: Home / Self Care | Payer: Medicare Other | Source: Ambulatory Visit | Attending: Nurse Practitioner | Admitting: Nurse Practitioner

## 2017-06-22 DIAGNOSIS — M5412 Radiculopathy, cervical region: Secondary | ICD-10-CM

## 2017-06-22 MED ORDER — IOPAMIDOL (ISOVUE-M 300) INJECTION 61%
1.0000 mL | Freq: Once | INTRAMUSCULAR | Status: AC | PRN
Start: 2017-06-22 — End: 2017-06-22
  Administered 2017-06-22: 1 mL via EPIDURAL

## 2017-06-22 MED ORDER — TRIAMCINOLONE ACETONIDE 40 MG/ML IJ SUSP (RADIOLOGY)
60.0000 mg | Freq: Once | INTRAMUSCULAR | Status: AC
  Administered 2017-06-22: 60 mg via EPIDURAL

## 2017-06-28 ENCOUNTER — Other Ambulatory Visit: Payer: Self-pay | Admitting: Nurse Practitioner

## 2017-06-28 DIAGNOSIS — M5412 Radiculopathy, cervical region: Secondary | ICD-10-CM

## 2017-06-30 ENCOUNTER — Other Ambulatory Visit: Payer: Self-pay | Admitting: Nurse Practitioner

## 2017-06-30 DIAGNOSIS — M5412 Radiculopathy, cervical region: Secondary | ICD-10-CM

## 2017-07-10 ENCOUNTER — Ambulatory Visit
Admission: RE | Admit: 2017-07-10 | Discharge: 2017-07-10 | Disposition: A | Discharge status: Home / Self Care | Payer: Medicare Other | Source: Ambulatory Visit | Attending: Nurse Practitioner | Admitting: Nurse Practitioner

## 2017-07-10 DIAGNOSIS — M5412 Radiculopathy, cervical region: Secondary | ICD-10-CM

## 2017-07-10 MED ORDER — IOPAMIDOL (ISOVUE-M 300) INJECTION 61%
1.0000 mL | Freq: Once | INTRAMUSCULAR | Status: AC | PRN
  Administered 2017-07-10: 1 mL via EPIDURAL

## 2017-07-10 MED ORDER — TRIAMCINOLONE ACETONIDE 40 MG/ML IJ SUSP (RADIOLOGY)
60.0000 mg | Freq: Once | INTRAMUSCULAR | Status: AC
  Administered 2017-07-10: 60 mg via EPIDURAL

## 2017-07-10 NOTE — Discharge Instructions (Signed)

## 2017-07-24 ENCOUNTER — Other Ambulatory Visit: Payer: Self-pay | Admitting: Internal Medicine

## 2017-07-24 DIAGNOSIS — Z1231 Encounter for screening mammogram for malignant neoplasm of breast: Secondary | ICD-10-CM

## 2017-08-28 ENCOUNTER — Ambulatory Visit
Admission: RE | Admit: 2017-08-28 | Discharge: 2017-08-28 | Disposition: A | Discharge status: Home / Self Care | Payer: Medicare Other | Source: Ambulatory Visit | Attending: Internal Medicine | Admitting: Internal Medicine

## 2017-08-28 DIAGNOSIS — Z1231 Encounter for screening mammogram for malignant neoplasm of breast: Secondary | ICD-10-CM

## 2017-09-11 ENCOUNTER — Ambulatory Visit
Admit: 2017-09-11 | Discharge: 2017-09-11 | Payer: TRICARE (CHAMPUS) | Attending: Obstetrics & Gynecology | Primary: Internal Medicine

## 2017-09-11 DIAGNOSIS — N760 Acute vaginitis: Secondary | ICD-10-CM

## 2017-09-11 LAB — AMB POC SMEAR, STAIN & INTERPRET, WET MOUNT

## 2017-09-11 MED ORDER — FLUCONAZOLE 150 MG TAB
150 mg | ORAL_TABLET | Freq: Every day | ORAL | 0 refills | Status: AC
Start: 2017-09-11 — End: 2017-09-12

## 2017-09-11 MED ORDER — CLOTRIMAZOLE-BETAMETHASONE 1 %-0.05 % TOPICAL CREAM
CUTANEOUS | 3 refills | Status: AC
Start: 2017-09-11 — End: ?

## 2017-09-11 NOTE — Patient Instructions (Signed)
Vaginitis: Care Instructions  Your Care Instructions    Vaginitis is soreness or infection of the vagina. This common problem can cause itching and burning. And it can cause a change in vaginal discharge. Sometimes it can cause pain during sex. Vaginitis may be caused by bacteria, yeast, or other germs. Some infections that cause it are caught from a sexual partner. Bath products, spermicides, and douches can irritate the vagina too.  Some women have this problem during and after menopause. A drop in estrogen levels during this time can cause dryness, soreness, and pain during sex.  Your doctor can give you medicine to treat an infection. And home care may help you feel better. For certain types of infections, your sex partner must be treated too.  Follow-up care is a key part of your treatment and safety. Be sure to make and go to all appointments, and call your doctor if you are having problems. It's also a good idea to know your test results and keep a list of the medicines you take.  How can you care for yourself at home?  ?? If your doctor prescribed antibiotics, take them as directed. Do not stop taking them just because you feel better. You need to take the full course of antibiotics.  ?? Take your medicines exactly as prescribed. Call your doctor if you think you are having a problem with your medicine.  ?? Do not eat or drink anything that has alcohol if you are taking metronidazole (Flagyl).  ?? If you have a yeast infection, use over-the-counter products as your doctor tells you to. Or take medicine your doctor prescribes exactly as directed.  ?? Wash your vaginal area daily with water. You also can use a mild, unscented soap if you want.  ?? Do not use scented bath products. And do not use vaginal sprays or douches.  ?? Put a washcloth soaked in cool water on the area to relieve itching. Or you can take cool baths.  ?? If you have dryness because of menopause, use estrogen cream or pills  that your doctor prescribes.  ?? Ask your doctor about when it is okay to have sex.  ?? Use a personal lubricant before sex if you have dryness. Examples are Astroglide, K-Y Jelly, and Wet Lubricant Gel.  ?? Ask your doctor if your sex partner also needs treatment.  When should you call for help?  Call your doctor now or seek immediate medical care if:  ?? ?? You have a fever and pelvic pain.   ??Watch closely for changes in your health, and be sure to contact your doctor if:  ?? ?? You have bleeding other than your period.   ?? ?? You do not get better as expected.   Where can you learn more?  Go to http://www.healthwise.net/[employer].  Enter F219 in the search box to learn more about "Vaginitis: Care Instructions."  Current as of: Sep 19, 2016  Content Version: 11.9  ?? 2006-2018 [employer]. Care instructions adapted under license by [employer] (which disclaims liability or warranty for this information). If you have questions about a medical condition or this instruction, always ask your healthcare professional. [employer] disclaims any warranty or liability for your use of this information.

## 2017-09-11 NOTE — Progress Notes (Signed)
Gynecologic Problem History and Physical    Subjective:     Shannon Zimmerman is a 62 y.o. African American female who presents with complaints of vulvar itching, occasional bleeding on the outside from scratching so much.  Denies vaginal discharge, odor, bleeding.  She saw her endocrinologist today and her blood sugars are not in good control.    No dysuria, fever, chills, nausea.  Is having some loose stools from metformin.    Allergies   Allergen Reactions   ??? Ace Inhibitors Cough   ??? Egg Swelling   ??? Egg Derived Swelling   ??? Iodine Rash   ??? Iodine And Iodide Containing Products Itching and Rash   ??? Nut - Unspecified Swelling   ??? Pravastatin Myalgia   ??? Shellfish Derived Other (comments)     WATER POCKETS   ??? Simvastatin Myalgia     Prior to Admission medications    Medication Sig Start Date End Date Taking? Authorizing Provider   albuterol (PROVENTIL HFA) 90 mcg/actuation inhaler Proventil HFA 90 mcg/actuation aerosol inhaler   Yes Provider, Historical   losartan (COZAAR) 25 mg tablet Take 25 mg by mouth. 02/24/17  Yes Provider, Historical   omeprazole (PRILOSEC) 20 mg capsule Take 20 mg by mouth. 12/26/16  Yes Provider, Historical   fluconazole (DIFLUCAN) 150 mg tablet Take 1 Tab by mouth daily for 1 day. FDA advises cautious prescribing of oral fluconazole in pregnancy. 09/11/17 09/12/17 Yes Binnie Rail, MD   clotrimazole-betamethasone (LOTRISONE) topical cream Apply small amount to external vulva bid prn 09/11/17  Yes Binnie Rail, MD   acetaminophen (TYLENOL) 650 mg TbER 1,300 mg. 08/18/15  Yes Provider, Historical   cetirizine (ZYRTEC) 10 mg tablet  11/08/16  Yes Provider, Historical   diclofenac (VOLTAREN) 1 % gel 2 g. 08/18/15  Yes Provider, Historical   exemestane (AROMASIN) 25 mg tablet Take 25 mg by mouth.   Yes Provider, Historical   fluticasone (FLONASE ALLERGY RELIEF) 50 mcg/actuation nasal spray Take 2 sprays every day by nasal route.   Yes Provider, Historical    fluocinoNIDE (LIDEX) 0.05 % topical cream APPLY TO THE AFFECTED AREA(S) BY TOPICAL ROUTE 2 TIMES PER DAY   Yes Provider, Historical   glucose blood VI test strips (FREESTYLE LITE STRIPS) strip FreeStyle Lite Strips   Yes Provider, Historical   aspirin 81 mg chewable tablet 81 mg. 01/15/16  Yes Provider, Historical   terconazole (TERAZOL 3) 80 mg vaginal suppository terconazole 80 mg vaginal suppository   Yes Provider, Historical   Insulin Needles, Disposable, (SURE COMFORT PEN NEEDLE) 31 gauge x 3/16" ndle Sure Comfort Pen Needle 31 gauge x 3/16"   Yes Provider, Historical   metoprolol succinate (TOPROL-XL) 200 mg XL tablet Take 200 mg by mouth daily.   Yes Provider, Historical   ezetimibe (ZETIA) 10 mg tablet Take  by mouth.   Yes Provider, Historical   prasugrel (EFFIENT) 10 mg tablet Take  by mouth.   Yes Provider, Historical   gabapentin (NEURONTIN) 300 mg capsule Take 300 mg by mouth three (3) times daily.   Yes Provider, Historical   metFORMIN (GLUCOPHAGE) 500 mg tablet Take  by mouth two (2) times daily (with meals).   Yes Provider, Historical   pravastatin (PRAVACHOL) 80 mg tablet Take 80 mg by mouth nightly.   Yes Provider, Historical   montelukast (SINGULAIR) 4 mg Take  by mouth every evening.   Yes Provider, Historical   nitroglycerin (NITROSTAT SL) by SubLINGual route.   Yes  Provider, Historical   cholecalciferol, VITAMIN D3, (VITAMIN D3) 5,000 unit tab tablet Take  by mouth daily.   Yes Provider, Historical   insulin glargine,hum.rec.anlog (LANTUS SC) by SubCUTAneous route.   Yes Provider, Historical   exenatide microspheres (BYDUREON SC) by SubCUTAneous route.   Yes Provider, Historical   exemestane (AROMASIN) 25 mg tablet  01/19/17   Provider, Historical   exenatide microspheres (BYDUREON) 2 mg/0.65 mL pnij 2 mg by SubCUTAneous route. 03/06/17   Provider, Historical   ezetimibe (ZETIA) 10 mg tablet TAKE 1 TABLET DAILY 04/01/17   Provider, Historical    metoprolol succinate (TOPROL-XL) 50 mg XL tablet Take 50 mg by mouth. 03/10/17   Provider, Historical   prasugrel (EFFIENT) 10 mg tablet Take 10 mg by mouth. 04/01/17   Provider, Historical   azithromycin (ZITHROMAX) 250 mg tablet azithromycin 250 mg tablet    Provider, Historical   exemestane (AROMASIN) 25 mg tablet exemestane 25 mg tablet    Provider, Historical   gabapentin (NEURONTIN) 100 mg capsule  11/03/16   Provider, Historical   glucose blood VI test strips (FREESTYLE LITE STRIPS) strip  06/16/15   Provider, Historical   HYDROcodone-acetaminophen (NORCO) 5-325 mg per tablet hydrocodone 5 mg-acetaminophen 325 mg tablet    Provider, Historical   NOVOLOG FLEXPEN U-100 INSULIN 100 unit/mL inpn  11/03/16   Provider, Historical   insulin aspart U-100 (NOVOLOG FLEXPEN U-100 INSULIN) 100 unit/mL inpn  07/09/15   Provider, Historical   terconazole (TERAZOL 7) 0.4 % vaginal cream terconazole 0.4 % vaginal cream    Provider, Historical   terconazole (TERAZOL 7) 0.4 % vaginal cream INSERT 1 APPLICATORFUL BY VAGINAL ROUTE ONCE DAILY AT BEDTIME FOR 7 DAYS 08/02/16   Provider, Historical   Insulin Needles, Disposable, (SURE COMFORT PEN NEEDLE) 32 gauge x 5/32" ndle Sure Comfort Pen Needle 32 gauge x 5/32"    Provider, Historical   phentermine-topiramate (QSYMIA) 7.5-46 mg CM24 Qsymia 7.5 mg-46 mg capsule, extended release    Provider, Historical   fluconazole (DIFLUCAN) 150 mg tablet TAKE 1 TABLET BY MOUTH EVERY DAY FOR 1 DAY 11/03/16   Buel Ream, NP   valsartan (DIOVAN) 160 mg tablet Take  by mouth daily.    Provider, Historical   cetirizine (ZYRTEC) 5 mg/5 mL solution Take  by mouth.    Provider, Historical   traZODone (DESYREL) 100 mg tablet Take 100 mg by mouth nightly.    Provider, Historical   insulin aspart prot/insuln asp (NOVOLOG MIX 70-30 SC) by SubCUTAneous route.    Provider, Historical     Past Medical History:   Diagnosis Date   ??? Arthritis    ??? Diabetes (HCC)    ??? Heart attack [facility]) 2016 /2017    ??? Hypertension       Past Surgical History:   Procedure Laterality Date   ??? HX CHOLECYSTECTOMY  1991   ??? HX OTHER SURGICAL      TUMMY TUCK      Family History   Problem Relation Age of Onset   ??? Heart Attack Mother    ??? Cancer Father         LUNG   ??? Breast Cancer Sister       Social History     Tobacco Use   ??? Smoking status: Never Smoker   ??? Smokeless tobacco: Never Used   Substance Use Topics   ??? Alcohol use: No      Social History     Social History Narrative   ???  Not on file       ROS:  Gynecologic:   Menses are absent.  Denies vaginal discharge, burn, odor.  Is having significant vulvar itching and bleeding from scratching.   Denies vaginal, pelvic, or abdominal pain.  Positive for vaginal dryness  General: good mood   G.I.: denies nausea, vomiting.  Positive for diarrhea and loose stools.  Urinary: denies dysuria, urgency, frequency, hematuria or incontinence.      Objective:     Visit Vitals  BP 118/77   Pulse (!) 102   Ht  (1.6 m)   Wt 176 lb (79.8 kg)   BMI 31.18 kg/m??       PE  General: WNWD female in NAD  Abdomen: + BS, - HSM, nontender, peritoneal signs: absent, rebound tenderness: absent, no mass present.  Pelvic exam.  External female genitalia: positive for erythema  BUS: no mass, nontender  Urethral Meatus: normal in appearance.  Urethra: no mass  Vagina: atrophic.  Cervix: no CMT or discharge  Uterus: nl size, nontender, mobile, anteverted  Adnexa: right: NT and without mass; left: NT and without mass  Recto vaginal septum/perineum: without mass or lesion.  Data Review:   Results for orders placed or performed in visit on 09/11/17   AMB POC SMEAR, STAIN & INTERPRET, WET MOUNT   Result Value Ref Range    Wet mount (POC)      Narrative    KOH Results:    Hypae: present  Buds: present  Wet Prep Results:  Trich: negative  Clue cells: negative  WBC's: normal  PH: 5.5           Assessment/Plan:     1. Vaginitis and vulvovaginitis        Orders Placed This Encounter    ??? AMB POC SMEAR, STAIN & INTERPRET, WET MOUNT   ??? fluconazole (DIFLUCAN) 150 mg tablet     Sig: Take 1 Tab by mouth daily for 1 day. FDA advises cautious prescribing of oral fluconazole in pregnancy.     Dispense:  1 Tab     Refill:  0   ??? clotrimazole-betamethasone (LOTRISONE) topical cream     Sig: Apply small amount to external vulva bid prn     Dispense:  15 g     Refill:  3     Discussed increased risk of yeast vulvovaginitis with elevated blood glucose levels.  Encouraged to get her DM in better control.     Patient Instructions          Vaginitis: Care Instructions  Your Care Instructions    Vaginitis is soreness or infection of the vagina. This common problem can cause itching and burning. And it can cause a change in vaginal discharge. Sometimes it can cause pain during sex. Vaginitis may be caused by bacteria, yeast, or other germs. Some infections that cause it are caught from a sexual partner. Bath products, spermicides, and douches can irritate the vagina too.  Some women have this problem during and after menopause. A drop in estrogen levels during this time can cause dryness, soreness, and pain during sex.  Your doctor can give you medicine to treat an infection. And home care may help you feel better. For certain types of infections, your sex partner must be treated too.  Follow-up care is a key part of your treatment and safety. Be sure to make and go to all appointments, and call your doctor if you are having problems. It's also a good  idea to know your test results and keep a list of the medicines you take.  How can you care for yourself at home?  ?? If your doctor prescribed antibiotics, take them as directed. Do not stop taking them just because you feel better. You need to take the full course of antibiotics.  ?? Take your medicines exactly as prescribed. Call your doctor if you think you are having a problem with your medicine.  ?? Do not eat or drink anything that has alcohol if you are taking  metronidazole (Flagyl).  ?? If you have a yeast infection, use over-the-counter products as your doctor tells you to. Or take medicine your doctor prescribes exactly as directed.  ?? Wash your vaginal area daily with water. You also can use a mild, unscented soap if you want.  ?? Do not use scented bath products. And do not use vaginal sprays or douches.  ?? Put a washcloth soaked in cool water on the area to relieve itching. Or you can take cool baths.  ?? If you have dryness because of menopause, use estrogen cream or pills that your doctor prescribes.  ?? Ask your doctor about when it is okay to have sex.  ?? Use a personal lubricant before sex if you have dryness. Examples are Astroglide, K-Y Jelly, and Wet Lubricant Gel.  ?? Ask your doctor if your sex partner also needs treatment.  When should you call for help?  Call your doctor now or seek immediate medical care if:  ?? ?? You have a fever and pelvic pain.   ??Watch closely for changes in your health, and be sure to contact your doctor if:  ?? ?? You have bleeding other than your period.   ?? ?? You do not get better as expected.   Where can you learn more?  Go to [employer].  Enter [phone] in the search box to learn more about "Vaginitis: Care Instructions."  Current as of: Sep 19, 2016  Content Version: 11.9  ?? 2006-2018 [employer]. Care instructions adapted under license by [employer] (which disclaims liability or warranty for this information). If you have questions about a medical condition or this instruction, always ask your healthcare professional. [employer] disclaims any warranty or liability for your use of this information.

## 2017-10-18 ENCOUNTER — Other Ambulatory Visit: Payer: Self-pay | Admitting: Neurosurgery

## 2017-10-18 DIAGNOSIS — M47816 Spondylosis without myelopathy or radiculopathy, lumbar region: Secondary | ICD-10-CM

## 2017-11-27 ENCOUNTER — Other Ambulatory Visit: Payer: Self-pay

## 2017-11-28 MED ORDER — ROSUVASTATIN CALCIUM 20 MG PO TABS: 20.0000 mg | ORAL_TABLET | Freq: Every day | ORAL | 1 refills | Status: DC

## 2017-11-28 NOTE — Telephone Encounter (Signed)
Ok to fill 

## 2017-12-06 ENCOUNTER — Ambulatory Visit
Admission: RE | Admit: 2017-12-06 | Discharge: 2017-12-06 | Disposition: A | Discharge status: Home / Self Care | Payer: Medicare Other | Source: Ambulatory Visit | Attending: Neurosurgery | Admitting: Neurosurgery

## 2017-12-06 DIAGNOSIS — M47816 Spondylosis without myelopathy or radiculopathy, lumbar region: Secondary | ICD-10-CM

## 2017-12-06 MED ORDER — METHYLPREDNISOLONE ACETATE 40 MG/ML INJ SUSP (RADIOLOG
120.0000 mg | Freq: Once | INTRAMUSCULAR | Status: AC
  Administered 2017-12-06: 120 mg via EPIDURAL

## 2017-12-06 MED ORDER — IOPAMIDOL (ISOVUE-M 200) INJECTION 41%
1.0000 mL | Freq: Once | INTRAMUSCULAR | Status: AC
  Administered 2017-12-06: 1 mL via EPIDURAL

## 2017-12-19 ENCOUNTER — Other Ambulatory Visit: Payer: Self-pay | Admitting: Neurosurgery

## 2017-12-19 DIAGNOSIS — M47816 Spondylosis without myelopathy or radiculopathy, lumbar region: Secondary | ICD-10-CM

## 2017-12-20 ENCOUNTER — Ambulatory Visit: Payer: Medicare Other | Admitting: Sports Medicine

## 2018-01-02 ENCOUNTER — Other Ambulatory Visit: Payer: Medicare Other

## 2018-01-04 ENCOUNTER — Other Ambulatory Visit: Payer: Medicare Other

## 2018-01-10 ENCOUNTER — Ambulatory Visit (INDEPENDENT_AMBULATORY_CARE_PROVIDER_SITE_OTHER): Payer: Medicare Other

## 2018-01-10 ENCOUNTER — Encounter: Payer: Self-pay | Admitting: Sports Medicine

## 2018-01-10 ENCOUNTER — Ambulatory Visit: Payer: Medicare Other | Admitting: Sports Medicine

## 2018-01-10 DIAGNOSIS — M2042 Other hammer toe(s) (acquired), left foot: Secondary | ICD-10-CM | POA: Diagnosis not present

## 2018-01-10 DIAGNOSIS — M79675 Pain in left toe(s): Secondary | ICD-10-CM

## 2018-01-10 DIAGNOSIS — M7752 Other enthesopathy of left foot: Secondary | ICD-10-CM

## 2018-01-10 DIAGNOSIS — M779 Enthesopathy, unspecified: Secondary | ICD-10-CM

## 2018-01-10 DIAGNOSIS — B351 Tinea unguium: Secondary | ICD-10-CM

## 2018-01-10 MED ORDER — TRIAMCINOLONE ACETONIDE 10 MG/ML IJ SUSP: 10.0000 mg | Freq: Once | INTRAMUSCULAR | Status: DC

## 2018-01-10 NOTE — Progress Notes (Signed)
Subjective: [name] is a 62 y.o. female patient who presents to office for evaluation of left foot pain. Patient complains of progressive pain especially at the left second toe for the last several months with pain that radiates up the top of the foot and pain that is also in her ball also reports that her second toenail is very thick does not recall any type of trauma or injury and reports that pain is 10 out of 10 and is now interferring with daily activities. Patient has tried no treatment for this.  Patient denies any other pedal complaints. Denies injury/trip/fall/sprain/any causative factors.   Patient Active Problem List   Diagnosis Date Noted  . Lumbar spondylosis 04/27/2017  . Cervical radiculopathy 04/27/2017  . 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 Coronary artery disease involving native coronary artery of native heart with angina pectoris (HCC)   . Pure hypercholesterolemia   . Abnormal stress test   . Chest pain with moderate risk for cardiac etiology   . Prolonged QT interval   . Bradycardia   . 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 Acetaminophen-Caffeine (EXCEDRIN ASPIRIN FREE PO) Take 2 tablets by mouth 3 (three) times daily as needed (pain).     Marland Kitchen ALPRAZolam (XANAX) 1 MG tablet Take 0.5 mg by mouth 3 (three) times daily as needed for anxiety.     . Alum & Mag Hydroxide-Simeth (MAALOX ADVANCED PO) Take as directed per bottle as needed for acid reflux.    . cyclobenzaprine (FLEXERIL) 10 MG tablet Take 10 mg by mouth 2 (two) times daily as needed for muscle spasms.    Marland Kitchen FLUoxetine (PROZAC) 20 MG capsule Take 60 mg by mouth daily.     . furosemide (LASIX) 20 MG tablet Take 10 mg to 20 mg by mouth daily as needed for swelling.    . gabapentin (NEURONTIN) 100 MG capsule Take 200 mg by mouth 3 (three) times daily.     Marland Kitchen HYDROcodone-acetaminophen (NORCO/VICODIN) 5-325 MG  tablet Take 1 tablet by mouth 3 (three) times daily.     Marland Kitchen lisinopril (PRINIVIL,ZESTRIL) 5 MG tablet Take 5 mg by mouth daily.    . meloxicam (MOBIC) 15 MG tablet Take 15 mg by mouth daily.     . nitroGLYCERIN (NITROSTAT) 0.4 MG SL tablet Place 1 tablet (0.4 mg total) under the tongue every 5 (five) minutes x 3 doses as needed for chest pain. 25 tablet 2  . omeprazole (PRILOSEC) 20 MG capsule Take 20 mg by mouth 2 (two) times daily.    . promethazine (PHENERGAN) 25 MG tablet Take half (1/2) to one (1) tablet by mouth three (3) times daily as needed for nausea/vomitting.    . ranitidine (ZANTAC) 150 MG tablet Take 150 mg by mouth daily.     . rosuvastatin (CRESTOR) 20 MG tablet Take 1 tablet (20 mg total) by mouth daily at 6 PM. 90 tablet 1   No current facility-administered medications on file prior to visit.     Allergies  Allergen Reactions  . Aspirin Nausea Only  . Oxybutynin Rash    Objective:  General: Alert and oriented x3 in no acute distress  Dermatology: No open lesions bilateral lower extremities, no webspace macerations, no ecchymosis bilateral, all nails x 10 are well manicured however bilateral second toes are very thick and mildly elongated.  Vascular: Dorsalis Pedis and Posterior Tibial pedal pulses palpable, Capillary Fill Time  3 seconds,(+) pedal hair growth bilateral, no edema bilateral lower extremities, Temperature gradient within normal limits.  Neurology: Gross sensation intact via light touch bilateral, protective sensation absent bilateral history of neuropathy.  Subjective burning pain on the left foot  Musculoskeletal: Mild tenderness with palpation at second metatarsal phalangeal joint on left with hammertoe deformity left greater than right and elongated second toe.  Patient has multiple digital deformities.  Gait: Antalgic gait  Xrays  Left foot   Impression: Normal osseous mineralization, there is significant dislocation of the left second toe with  elongation no fracture, cavus foot type, inferior heel spur, soft tissues within normal limits with no other acute findings.  Assessment and Plan: Problem List Items Addressed This Visit    None    Visit Diagnoses    Capsulitis    -  Primary   Pain in left toe(s)       Relevant Orders   DG Foot Complete Left   Hammer toe of left foot       Onychomycosis           -Complete examination performed -Xrays reviewed -Discussed treatement options for likely left second toe capsulitis with possible acute neuritis in the setting of hammertoe that is likely causing her nail to be thickened -After oral consent and aseptic prep, injected a mixture containing 1 ml of 2%  plain lidocaine, 1 ml 0.5% plain marcaine, 0.5 ml of kenalog 10 and 0.5 ml of dexamethasone phosphate into left second metatarsophalangeal joint without complication. Post-injection care discussed with patient.  -Applied strapping to assist with rectus position of left second toe and advised patient to continue with the same at home daily -Encouraged good supportive shoes rest ice elevation until symptoms improve -Advised patient if pain continues to strongly consider possible surgical option including correction of the digital deformity versus amputation -Patient to return to office as needed or sooner if condition worsens.  Asencion Islam, DPM

## 2018-01-12 ENCOUNTER — Other Ambulatory Visit: Payer: Self-pay | Admitting: Sports Medicine

## 2018-01-12 DIAGNOSIS — M2042 Other hammer toe(s) (acquired), left foot: Secondary | ICD-10-CM

## 2018-01-12 DIAGNOSIS — M779 Enthesopathy, unspecified: Secondary | ICD-10-CM

## 2018-01-12 DIAGNOSIS — M79675 Pain in left toe(s): Secondary | ICD-10-CM

## 2018-01-15 ENCOUNTER — Ambulatory Visit
Admission: RE | Admit: 2018-01-15 | Discharge: 2018-01-15 | Disposition: A | Discharge status: Home / Self Care | Payer: Medicare Other | Source: Ambulatory Visit | Attending: Neurosurgery | Admitting: Neurosurgery

## 2018-01-15 DIAGNOSIS — M47816 Spondylosis without myelopathy or radiculopathy, lumbar region: Secondary | ICD-10-CM

## 2018-01-15 MED ORDER — METHYLPREDNISOLONE ACETATE 40 MG/ML INJ SUSP (RADIOLOG
120.0000 mg | Freq: Once | INTRAMUSCULAR | Status: AC
  Administered 2018-01-15: 120 mg via EPIDURAL

## 2018-01-15 MED ORDER — IOPAMIDOL (ISOVUE-M 200) INJECTION 41%
1.0000 mL | Freq: Once | INTRAMUSCULAR | Status: AC
  Administered 2018-01-15: 1 mL via EPIDURAL

## 2018-01-26 ENCOUNTER — Other Ambulatory Visit: Payer: Self-pay | Admitting: Nurse Practitioner

## 2018-01-26 DIAGNOSIS — M5417 Radiculopathy, lumbosacral region: Secondary | ICD-10-CM

## 2018-02-12 ENCOUNTER — Ambulatory Visit
Admission: RE | Admit: 2018-02-12 | Discharge: 2018-02-12 | Disposition: A | Discharge status: Home / Self Care | Payer: Medicare Other | Source: Ambulatory Visit | Attending: Nurse Practitioner | Admitting: Nurse Practitioner

## 2018-02-12 DIAGNOSIS — M5417 Radiculopathy, lumbosacral region: Secondary | ICD-10-CM

## 2018-02-12 MED ORDER — IOPAMIDOL (ISOVUE-M 200) INJECTION 41%
1.0000 mL | Freq: Once | INTRAMUSCULAR | Status: AC
  Administered 2018-02-12: 1 mL via EPIDURAL

## 2018-02-12 MED ORDER — METHYLPREDNISOLONE ACETATE 40 MG/ML INJ SUSP (RADIOLOG
120.0000 mg | Freq: Once | INTRAMUSCULAR | Status: AC
  Administered 2018-02-12: 120 mg via EPIDURAL

## 2018-04-03 ENCOUNTER — Other Ambulatory Visit: Payer: Self-pay | Admitting: Internal Medicine

## 2018-04-03 DIAGNOSIS — N6320 Unspecified lump in the left breast, unspecified quadrant: Secondary | ICD-10-CM

## 2018-04-10 ENCOUNTER — Ambulatory Visit
Admission: RE | Admit: 2018-04-10 | Discharge: 2018-04-10 | Disposition: A | Discharge status: Home / Self Care | Payer: Medicare Other | Source: Ambulatory Visit | Attending: Internal Medicine | Admitting: Internal Medicine

## 2018-04-10 DIAGNOSIS — N6320 Unspecified lump in the left breast, unspecified quadrant: Secondary | ICD-10-CM

## 2018-05-24 ENCOUNTER — Other Ambulatory Visit: Payer: Self-pay | Admitting: Interventional Cardiology

## 2018-06-05 ENCOUNTER — Encounter: Payer: Self-pay | Admitting: Diagnostic Neuroimaging

## 2018-06-05 ENCOUNTER — Ambulatory Visit: Payer: Medicare Other | Admitting: Diagnostic Neuroimaging

## 2018-06-05 VITALS — BP 150/81 | HR 81 | Ht 63.0 in | Wt 220.2 lb

## 2018-06-05 DIAGNOSIS — R413 Other amnesia: Secondary | ICD-10-CM

## 2018-06-05 DIAGNOSIS — G894 Chronic pain syndrome: Secondary | ICD-10-CM | POA: Diagnosis not present

## 2018-06-05 DIAGNOSIS — G890 Central pain syndrome: Secondary | ICD-10-CM

## 2018-06-05 DIAGNOSIS — R200 Anesthesia of skin: Secondary | ICD-10-CM

## 2018-06-05 NOTE — Progress Notes (Signed)
[facility]  PATIENT: [name] DOB: [birth year]  REFERRING CLINICIAN: Tommie Ard HISTORY FROM: patient and friend REASON FOR VISIT: new consult    HISTORICAL  CHIEF COMPLAINT:  Chief Complaint  Patient presents with  . New Patient (Initial Visit)    Referred by Dr. [name]  . Bilateral leg painb numbness and paresthesias of both hands    Rm 7, friend.  Worse at rest.  Falls, uses arm walkers.  Balance off.      HISTORY OF PRESENT ILLNESS:   63 year old female here for evaluation of numbness, tingling, pain.  Patient has long history of chronic pain.  In her 6s she had unexplained stomach pain.  She underwent cholecystectomy without relief.  Patient also has long history of depression anxiety since her 50s.  She was on treatment with psychiatry and psychology in the past but has not seen anyone since the 1990s.  Her PCP currently treats these with medication.  Patient also has had diagnosis of fibromyalgia.  She has chronic pain in her knees, neck and back.  She is underwent multiple surgeries including bilateral knee replacements, rotator cuff surgeries, hip surgeries.  Her last major surgery was lumbar spine surgery in 2008.   6 months ago, perhaps 2 years ago, patient had increasing numbness and pain in her hands, feet, legs and body.  She was evaluated by her neurosurgeon Dr. Carloyn Manner, who ordered MRI scans and recommended additional lumbar spine surgery.  He did not want to proceed surgical treatment.  She is been treated with epidural steroid which have helped a little bit.  Patient is frustrated.  Her depression anxiety are worse.  Her balance and gait are poor.  She is afraid of falling down.  Memory loss is getting worse.   REVIEW OF SYSTEMS: Full 14 system review of systems performed and negative with exception of: Numbness confusion headache fatigue pain cramps depression anxiety sleepiness restless legs  dizziness.   ALLERGIES: Allergies  Allergen Reactions  . Aspirin Nausea Only  . Oxybutynin Rash    HOME MEDICATIONS: Outpatient Medications Prior to Visit  Medication Sig Dispense Refill  . Acetaminophen-Caffeine (EXCEDRIN ASPIRIN FREE PO) Take 2 tablets by mouth 3 (three) times daily as needed (pain).     Marland Kitchen ALPRAZolam (XANAX) 1 MG tablet Take 0.5 mg by mouth 3 (three) times daily as needed for anxiety.     . Alum & Mag Hydroxide-Simeth (MAALOX ADVANCED PO) Take as directed per bottle as needed for acid reflux.    . cyclobenzaprine (FLEXERIL) 10 MG tablet Take 10 mg by mouth 2 (two) times daily as needed for muscle spasms.    Marland Kitchen FLUoxetine (PROZAC) 20 MG capsule Take 60 mg by mouth daily.     . furosemide (LASIX) 20 MG tablet Take 10 mg to 20 mg by mouth daily as needed for swelling.    . gabapentin (NEURONTIN) 100 MG capsule Take 200 mg by mouth 3 (three) times daily.     Marland Kitchen HYDROcodone-acetaminophen (NORCO/VICODIN) 5-325 MG tablet Take 1 tablet by mouth 3 (three) times daily.     Marland Kitchen lisinopril (PRINIVIL,ZESTRIL) 5 MG tablet Take 5 mg by mouth daily.    . meloxicam (MOBIC) 15 MG tablet Take 15 mg by mouth daily.     . nitroGLYCERIN (NITROSTAT) 0.4 MG SL tablet Place 1 tablet (0.4 mg total) under the tongue every 5 (five) minutes x 3 doses as needed for chest pain. 25 tablet 2  . omeprazole (PRILOSEC) 20 MG  capsule Take 20 mg by mouth 2 (two) times daily.    . promethazine (PHENERGAN) 25 MG tablet Take half (1/2) to one (1) tablet by mouth three (3) times daily as needed for nausea/vomitting.    . ranitidine (ZANTAC) 150 MG tablet Take 150 mg by mouth daily.     . rosuvastatin (CRESTOR) 20 MG tablet Take 1 tablet (20 mg total) by mouth daily at 6 PM. 90 tablet 1   Facility-Administered Medications Prior to Visit  Medication Dose Route Frequency Provider Last Rate Last Dose  . triamcinolone acetonide (KENALOG) 10 MG/ML injection 10 mg  10 mg Other Once Landis Martins, DPM        PAST  MEDICAL HISTORY: Past Medical History:  Diagnosis Date  . Anemia   . Anxiety   . Arthritis    "all over" (11/16/2016)  . Chronic lower back pain   . Chronic pain syndrome   . Chronic pain syndrome    "all over" (11/16/2016)  . CKD (chronic kidney disease), stage III ([place])   . Daily headache   . Depression   . Fibromyalgia   . GERD (gastroesophageal reflux disease)   . High cholesterol   . History of blood transfusion    "related to hand laceration"  . History of hiatal hernia   . History of stomach ulcers    "bled when I was younger" (11/16/2016)  . Hypertension    hx; "they took me off my RX" (11/16/2016)  . Migraine 1970s  . Phlebitis of leg, right    "years ago" (11/16/2016)    PAST SURGICAL HISTORY: Past Surgical History:  Procedure Laterality Date  . ABDOMINAL HYSTERECTOMY  1993   w/left ovary  . ANKLE FRACTURE SURGERY Bilateral   . APPENDECTOMY  1977  . BACK SURGERY    . CARDIAC CATHETERIZATION  2005   "negative"  . CHOLECYSTECTOMY OPEN  1977  . ESOPHAGOGASTRODUODENOSCOPY  2008   dx'd GERD  . FOREARM FRACTURE SURGERY Right   . HIP SURGERY Bilateral    "took out bone spurs"  . JOINT REPLACEMENT    . KNEE ARTHROSCOPY Bilateral    "total of 18 times"  . LACERATION REPAIR Right    hand; "went thru glass door"  . LEFT HEART CATH AND CORONARY ANGIOGRAPHY N/A 11/18/2016   Procedure: Left Heart Cath and Coronary Angiography;  Surgeon: Nelva Bush, MD;  Location: [facility];  Service: Cardiovascular;  Laterality: N/A;  . LUMBAR FUSION     "put rods in"  . LUMBAR LAMINECTOMY/DECOMPRESSION MICRODISCECTOMY  05/2006  . REVISION TOTAL KNEE ARTHROPLASTY Bilateral   . RIGHT OOPHORECTOMY Right   . SHOULDER OPEN ROTATOR CUFF REPAIR Bilateral    left 04/2006  . TONSILLECTOMY    . TOTAL KNEE ARTHROPLASTY Bilateral    left 1998; right 2004  . TUBAL LIGATION      FAMILY HISTORY: Family History  Problem Relation Age of Onset  . CAD Father 40  . Lung cancer  Father   . COPD Mother   . Emphysema Mother   . Other Sister        TRAP syndrome  . Diabetes Sister        boarderline    SOCIAL HISTORY: Social History   Socioeconomic History  . Marital status: Widowed    Spouse name: Not on file  . Number of children: Not on file  . Years of education: Not on file  . Highest education level: Not on file  Occupational History  .  Not on file  Social Needs  . Financial resource strain: Not on file  . Food insecurity:    Worry: Not on file    Inability: Not on file  . Transportation needs:    Medical: Not on file    Non-medical: Not on file  Tobacco Use  . Smoking status: Never Smoker  . Smokeless tobacco: Never Used  Substance and Sexual Activity  . Alcohol use: No  . Drug use: No  . Sexual activity: Yes  Lifestyle  . Physical activity:    Days per week: Not on file    Minutes per session: Not on file  . Stress: Not on file  Relationships  . Social connections:    Talks on phone: Not on file    Gets together: Not on file    Attends religious service: Not on file    Active member of club or organization: Not on file    Attends meetings of clubs or organizations: Not on file    Relationship status: Not on file  . Intimate partner violence:    Fear of current or ex partner: Not on file    Emotionally abused: Not on file    Physically abused: Not on file    Forced sexual activity: Not on file  Other Topics Concern  . Not on file  Social History Narrative   Lives with Michaele Offer.  Education: college.  Children none.  Retired and Disabled.  Caffeine Drinks tea.     PHYSICAL EXAM  GENERAL EXAM/CONSTITUTIONAL: Vitals:  Vitals:   06/05/18 1530  BP: (!) 150/81  Pulse: 81  Weight: 220 lb 3.2 oz (99.9 kg)  Height: 5' 3"  (1.6 m)     Body mass index is 39.01 kg/m. Wt Readings from Last 3 Encounters:  06/05/18 220 lb 3.2 oz (99.9 kg)  05/19/17 215 lb 3.2 oz (97.6 kg)  11/28/16 196 lb 6.4 oz (89.1 kg)     Patient  is in no distress; well developed, nourished and groomed; neck is supple  CARDIOVASCULAR:  Examination of carotid arteries is normal; no carotid bruits  Regular rate and rhythm, no murmurs  Examination of peripheral vascular system by observation and palpation is normal  EYES:  Ophthalmoscopic exam of optic discs and posterior segments is normal; no papilledema or hemorrhages  Visual Acuity Screening   Right eye Left eye Both eyes  Without correction: 20/40 20/50   With correction:        MUSCULOSKELETAL:  Gait, strength, tone, movements noted in Neurologic exam below  NEUROLOGIC: MENTAL STATUS:  No flowsheet data found.  awake, alert, oriented to person, place and time  recent and remote memory intact  normal attention and concentration  language fluent, comprehension intact, naming intact  fund of knowledge appropriate  CRANIAL NERVE:   2nd - no papilledema on fundoscopic exam  2nd, 3rd, 4th, 6th - pupils equal and reactive to light, visual fields full to confrontation, extraocular muscles intact, no nystagmus  5th - facial sensation symmetric  7th - facial strength symmetric  8th - hearing intact  9th - palate elevates symmetrically, uvula midline  11th - shoulder shrug symmetric  12th - tongue protrusion midline  MOTOR:   normal bulk and tone, full strength in the BUE, BLE  SENSORY:   normal and symmetric to light touch, pinprick, temperature, vibration  COORDINATION:   finger-nose-finger, fine finger movements normal  REFLEXES:   deep tendon reflexes present and symmetric  GAIT/STATION:   narrow based  gait; able to walk on toes, heels and tandem; romberg is negative     DIAGNOSTIC DATA (LABS, IMAGING, TESTING) - I reviewed patient records, labs, notes, testing and imaging myself where available.  Lab Results  Component Value Date   WBC 4.0 11/18/2016   HGB 10.8 (L) 11/18/2016   HCT 35.2 (L) 11/18/2016   MCV 74.9 (L)  11/18/2016   PLT 191 11/18/2016      Component Value Date/Time   NA 142 11/28/2016 1257   K 4.0 11/28/2016 1257   CL 103 11/28/2016 1257   CO2 23 11/28/2016 1257   GLUCOSE 110 (H) 11/28/2016 1257   GLUCOSE 83 11/18/2016 1357   BUN 21 11/28/2016 1257   CREATININE 1.30 (H) 11/28/2016 1257   CALCIUM 9.4 11/28/2016 1257   PROT 6.5 11/16/2016 1225   ALBUMIN 3.5 11/16/2016 1225   AST 19 11/16/2016 1225   ALT 10 (L) 11/16/2016 1225   ALKPHOS 76 11/16/2016 1225   BILITOT 0.5 11/16/2016 1225   GFRNONAA 44 (L) 11/28/2016 1257   GFRAA 51 (L) 11/28/2016 1257   Lab Results  Component Value Date   CHOL 130 11/17/2016   HDL 41 11/17/2016   LDLCALC 46 11/17/2016   TRIG 215 (H) 11/17/2016   CHOLHDL 3.2 11/17/2016   Lab Results  Component Value Date   HGBA1C 5.4 11/16/2016   No results found for: VITAMINB12 Lab Results  Component Value Date   TSH 1.514 11/16/2016    11/16/16 CT head  - No evidence of acute intracranial abnormality. - Mild cortical atrophy.  05/17/17 MRI cervical spine [I reviewed images myself and agree with interpretation. -VRP]  1. Motion degraded examination without acute fracture or malalignment. 2. Stable degenerative change of the cervical spine. Mild canal stenosis C3-4. 3. C3-4 and C5-6 neural foraminal narrowing, likely moderate on the LEFT at C5-6.  07/10/17 MRI lumbar spine [I reviewed images myself and agree with interpretation. -VRP]  1. Fusion at L3-4 and L4-5 without residual or recurrent stenosis at these levels. 2. Broad-based disc protrusion at L2-3 is asymmetric to the left with mild left foraminal narrowing. 3. Mild disc bulging at L5-S1 without significant focal stenosis.    ASSESSMENT AND PLAN  63 y.o. year old female here with chronic pain syndrome, neck pain, back pain, numbness and pain in hands and feet, for many years.  Symptoms worsened in 2018.  Long history of depression and anxiety and fibromyalgia since 52s or 30s.  Dx:  1.  Chronic pain syndrome   2. Numbness   3. Memory loss      PLAN:  - MRI brain (rule out central pain syndrome causes; autoimmune, inflamm, vascular) - labs - consider EMG/NCS - continue gabapentin; gradually titrate up per PCP - optimize depression / anxiety (consider psychiatry / psychology) - optimize nutrition, exercise, mindfulness tx - consider PT evaluation; use cane or walker  Orders Placed This Encounter  Procedures  . MR BRAIN W WO CONTRAST  . Vitamin B12  . TSH  . Hemoglobin A1c  . ANA,IFA RA Diag Pnl w/rflx Tit/Patn  . Multiple Myeloma Panel (SPEP&IFE w/QIG)  . Uric Acid   Return in about 4 months (around 10/04/2018).    Penni Bombard, MD 9/73/5329, 9:24 PM Certified in Neurology, Neurophysiology and Neuroimaging  [facility] [address] [place], [place] [address] [phone]

## 2018-06-06 ENCOUNTER — Telehealth: Payer: Self-pay | Admitting: Diagnostic Neuroimaging

## 2018-06-06 NOTE — Telephone Encounter (Signed)
lvm for pt to be aware I left GI phone number of [phone] and to give them a call if she has not heard from them in the next 2-3 business days.  °

## 2018-06-06 NOTE — Telephone Encounter (Signed)
[employer] medicare order sent to [facility]. No auth they will reach out to the pt to schedule.  °

## 2018-06-12 ENCOUNTER — Emergency Department (HOSPITAL_COMMUNITY): Payer: Medicare Other

## 2018-06-12 ENCOUNTER — Encounter (HOSPITAL_COMMUNITY): Payer: Self-pay

## 2018-06-12 ENCOUNTER — Emergency Department (HOSPITAL_COMMUNITY)
Admission: EM | Admit: 2018-06-12 | Discharge: 2018-06-12 | Disposition: A | Discharge status: Home / Self Care | Payer: Medicare Other | Attending: Emergency Medicine | Admitting: Emergency Medicine

## 2018-06-12 DIAGNOSIS — Y998 Other external cause status: Secondary | ICD-10-CM | POA: Insufficient documentation

## 2018-06-12 DIAGNOSIS — Z79899 Other long term (current) drug therapy: Secondary | ICD-10-CM | POA: Diagnosis not present

## 2018-06-12 DIAGNOSIS — S0990XA Unspecified injury of head, initial encounter: Secondary | ICD-10-CM | POA: Diagnosis not present

## 2018-06-12 DIAGNOSIS — Y939 Activity, unspecified: Secondary | ICD-10-CM | POA: Diagnosis not present

## 2018-06-12 DIAGNOSIS — N183 Chronic kidney disease, stage 3 (moderate): Secondary | ICD-10-CM | POA: Insufficient documentation

## 2018-06-12 DIAGNOSIS — Y92003 Bedroom of unspecified non-institutional (private) residence as the place of occurrence of the external cause: Secondary | ICD-10-CM | POA: Insufficient documentation

## 2018-06-12 DIAGNOSIS — S52592A Other fractures of lower end of left radius, initial encounter for closed fracture: Secondary | ICD-10-CM | POA: Insufficient documentation

## 2018-06-12 DIAGNOSIS — S62102A Fracture of unspecified carpal bone, left wrist, initial encounter for closed fracture: Secondary | ICD-10-CM

## 2018-06-12 DIAGNOSIS — I129 Hypertensive chronic kidney disease with stage 1 through stage 4 chronic kidney disease, or unspecified chronic kidney disease: Secondary | ICD-10-CM | POA: Diagnosis not present

## 2018-06-12 DIAGNOSIS — E7800 Pure hypercholesterolemia, unspecified: Secondary | ICD-10-CM | POA: Diagnosis not present

## 2018-06-12 DIAGNOSIS — W06XXXA Fall from bed, initial encounter: Secondary | ICD-10-CM | POA: Diagnosis not present

## 2018-06-12 DIAGNOSIS — G890 Central pain syndrome: Secondary | ICD-10-CM

## 2018-06-12 MED ORDER — OXYCODONE-ACETAMINOPHEN 5-325 MG PO TABS
1.0000 | ORAL_TABLET | ORAL | Status: DC | PRN
  Administered 2018-06-12: 1 via ORAL
  Filled 2018-06-12: qty 1

## 2018-06-12 MED ORDER — OXYCODONE-ACETAMINOPHEN 5-325 MG PO TABS
1.0000 | ORAL_TABLET | Freq: Once | ORAL | Status: AC
  Administered 2018-06-12: 1 via ORAL
  Filled 2018-06-12: qty 1

## 2018-06-12 NOTE — ED Provider Notes (Signed)
TIME SEEN: 5:32 AM  CHIEF COMPLAINT: Fall out of bed  HPI: She is a 63 year old female with history of chronic pain, hypertension, hyperlipidemia, fibromyalgia who presents to the emergency department after she fell out of bed.  States that she hit her head when she fell out of bed.  There is no loss of consciousness.  She is not on blood thinners.  Has periorbital ecchymosis and swelling around the right eye.  No eye pain or vision changes.  No neck or back pain.  Complaining of right hip pain which is chronic.  Able to ambulate.  Also having left arm pain diffusely but worse in the left wrist.  She is right-hand dominant.  No numbness, tingling or focal weakness.  Given 1 Percocet in the waiting room.  She is on hydrocodone chronically and is on a pain contract.  ROS: See HPI Constitutional: no fever  Eyes: no drainage  ENT: no runny nose   Cardiovascular:  no chest pain  Resp: no SOB  GI: no vomiting GU: no dysuria Integumentary: no rash  Allergy: no hives  Musculoskeletal: no leg swelling  Neurological: no slurred speech ROS otherwise negative  PAST MEDICAL HISTORY/PAST SURGICAL HISTORY:  Past Medical History:  Diagnosis Date  . Anemia   . Anxiety   . Arthritis    "all over" (11/16/2016)  . Chronic lower back pain   . Chronic pain syndrome   . Chronic pain syndrome    "all over" (11/16/2016)  . CKD (chronic kidney disease), stage III ([facility])   . Daily headache   . Depression   . Fibromyalgia   . GERD (gastroesophageal reflux disease)   . High cholesterol   . History of blood transfusion    "related to hand laceration"  . History of hiatal hernia   . History of stomach ulcers    "bled when I was younger" (11/16/2016)  . Hypertension    hx; "they took me off my RX" (11/16/2016)  . Migraine 1970s  . Phlebitis of leg, right    "years ago" (11/16/2016)    MEDICATIONS:  Prior to Admission medications   Medication Sig Start Date End Date Taking? Authorizing Provider   Acetaminophen-Caffeine (EXCEDRIN ASPIRIN FREE PO) Take 2 tablets by mouth 3 (three) times daily as needed (pain).     [provider]  ALPRAZolam Prudy Feeler) 1 MG tablet Take 0.5 mg by mouth 3 (three) times daily as needed for anxiety.     [provider]  Alum & Mag Hydroxide-Simeth (MAALOX ADVANCED PO) Take as directed per bottle as needed for acid reflux.    [provider]  cyclobenzaprine (FLEXERIL) 10 MG tablet Take 10 mg by mouth 2 (two) times daily as needed for muscle spasms.    [provider]  FLUoxetine (PROZAC) 20 MG capsule Take 60 mg by mouth daily.     [provider]  furosemide (LASIX) 20 MG tablet Take 10 mg to 20 mg by mouth daily as needed for swelling.    [provider]  gabapentin (NEURONTIN) 100 MG capsule Take 200 mg by mouth 3 (three) times daily.     [provider]  HYDROcodone-acetaminophen (NORCO/VICODIN) 5-325 MG tablet Take 1 tablet by mouth 3 (three) times daily.  10/17/16   [provider]  lisinopril (PRINIVIL,ZESTRIL) 5 MG tablet Take 5 mg by mouth daily. 03/13/17   [provider]  meloxicam (MOBIC) 15 MG tablet Take 15 mg by mouth daily.     [provider]  nitroGLYCERIN (NITROSTAT) 0.4 MG SL tablet Place 1 tablet (0.4 mg total) under the tongue every 5 (five) minutes x 3 doses as needed for chest pain. 11/18/16   Robbie Lis M, PA-C  omeprazole (PRILOSEC) 20 MG capsule Take 20 mg by mouth 2 (two) times daily. 03/08/17   [provider]  promethazine (PHENERGAN) 25 MG tablet Take half (1/2) to one (1) tablet by mouth three (3) times daily as needed for nausea/vomitting. 05/17/17   [provider]  ranitidine (ZANTAC) 150 MG tablet Take 150 mg by mouth daily.     [provider]  rosuvastatin (CRESTOR) 20 MG tablet Take 1 tablet (20 mg total) by mouth daily at 6 PM. 11/28/17   Lyn Records, MD    ALLERGIES:  Allergies  Allergen Reactions  .  Aspirin Nausea Only  . Oxybutynin Rash    SOCIAL HISTORY:  Social History   Tobacco Use  . Smoking status: Never Smoker  . Smokeless tobacco: Never Used  Substance Use Topics  . Alcohol use: No    FAMILY HISTORY: Family History  Problem Relation Age of Onset  . CAD Father 14  . Lung cancer Father   . COPD Mother   . Emphysema Mother   . Other Sister        TRAP syndrome  . Diabetes Sister        boarderline    EXAM: BP (!) 166/82 (BP Location: Right Arm)   Pulse 78   Temp 98.4 F (36.9 C) (Oral)   Resp 18   SpO2 100%  CONSTITUTIONAL: Alert and oriented and responds appropriately to questions. Well-appearing; well-nourished; GCS 15 HEAD: Normocephalic; patient has right periorbital ecchymosis and swelling EYES: Conjunctivae clear, PERRL, EOMI, reports normal vision ENT: normal nose; no rhinorrhea; moist mucous membranes; pharynx without lesions noted; no dental injury; no septal hematoma NECK: Supple, no meningismus, no LAD; no midline spinal tenderness, step-off or deformity; trachea midline CARD: RRR; S1 and S2 appreciated; no murmurs, no clicks, no rubs, no gallops RESP: Normal chest excursion without splinting or tachypnea; breath sounds clear and equal bilaterally; no wheezes, no rhonchi, no rales; no hypoxia or respiratory distress CHEST:  chest wall stable, no crepitus or ecchymosis or deformity, nontender to palpation; no flail chest ABD/GI: Normal bowel sounds; non-distended; soft, non-tender, no rebound, no guarding; no ecchymosis or other lesions noted PELVIS:  stable, nontender to palpation BACK:  The back appears normal and is non-tender to palpation, there is no CVA tenderness; no midline spinal tenderness, step-off or deformity EXT: Tender to palpation diffusely throughout the entire left arm with soft tissue swelling and mild bruising noted to the left dorsal wrist.  2+ left radial pulse.  Compartments throughout the left arm are soft.  Also tender over the  right lateral hip without deformity or leg length discrepancy.  No numbness or tingling throughout her extremities.  Extremities are warm and well-perfused.  No joint effusions noted other than swelling in the left wrist. SKIN: Normal color for age and race; warm NEURO: Moves all extremities equally PSYCH: The patient's mood and manner are appropriate. Grooming and personal hygiene are appropriate.  MEDICAL DECISION MAKING: Patient here with fall out of bed.  Has significant bruising and swelling to the right side of the face.  Not on blood thinners.  CT of the head and face show no acute abnormality.  X-ray showed normal-appearing right hip.  X-ray of the left wrist shows a comminuted, impacted burst fractures  of the distal left radial metaphysis with extension into the radial ulnar and radiocarpal joints.  There is an associated displaced fracture of the left ulnar styloid process.  Also has a possible triquetrum fracture.  Will place her in a sugar tong splint with sling.  Discussed with Dr. Orlan Leavensrtman who will see patient in follow-up.  Given patient is on a pain contract, I have advised her to contact her pain management doctor if hydrocodone is not appropriately managing her pain as they may be able to give her a short course of Percocet for pain control as that has worked well for her in the ED.  Discussed head injury return precautions.  Will discharge home with outpatient follow-up.  SPLINT APPLICATION Date/Time: 6:31 AM Authorized by: Baxter HireKristen Tadan Shill Consent: Verbal consent obtained. Risks and benefits: risks, benefits and alternatives were discussed Consent given by: patient Splint applied by: orthopedic technician Location details: Left wrist Splint type: Sugar tong Supplies used: Fiberglass Post-procedure: The splinted body part was neurovascularly unchanged following the procedure. Patient tolerance: Patient tolerated the procedure well with no immediate complications.   At this time, I  do not feel there is any life-threatening condition present. I have reviewed and discussed all results (EKG, imaging, lab, urine as appropriate) and exam findings with patient/family. I have reviewed nursing notes and appropriate previous records.  I feel the patient is safe to be discharged home without further emergent workup and can continue workup as an outpatient as needed. Discussed usual and customary return precautions. Patient/family verbalize understanding and are comfortable with this plan.  Outpatient follow-up has been provided as needed. All questions have been answered.      Kylan Liberati, Layla MawKristen N, DO 06/12/18 [phone]

## 2018-06-12 NOTE — Progress Notes (Signed)
Orthopedic Tech Progress Note Patient Details:  [name] 1955-05-19 [id number]  Ortho Devices Type of Ortho Device: Arm sling, Sugartong splint Ortho Device/Splint Location: lue Ortho Device/Splint Interventions: Ordered, Application, Adjustment   Post Interventions Patient Tolerated: Well Instructions Provided: Care of device, Adjustment of device   Trinna Post 06/12/2018, 6:55 AM

## 2018-06-12 NOTE — ED Triage Notes (Signed)
Pt here after falling out of bed resulting in a right eye hematoma and left wrist swelling and pain.  No blood thinners or feeling dizzy. No neck pain. A&Ox4. Does have chromic right hip pain but now having new increased pain in that area.

## 2018-06-15 ENCOUNTER — Encounter (HOSPITAL_COMMUNITY): Payer: Self-pay | Admitting: Emergency Medicine

## 2018-06-15 ENCOUNTER — Other Ambulatory Visit: Payer: Self-pay

## 2018-06-15 ENCOUNTER — Observation Stay (HOSPITAL_COMMUNITY)
Admission: EM | Admit: 2018-06-15 | Discharge: 2018-06-16 | Disposition: A | Discharge status: Home / Self Care | Payer: Medicare Other | Attending: Family Medicine | Admitting: Family Medicine

## 2018-06-15 DIAGNOSIS — Z79899 Other long term (current) drug therapy: Secondary | ICD-10-CM | POA: Insufficient documentation

## 2018-06-15 DIAGNOSIS — M545 Low back pain: Secondary | ICD-10-CM | POA: Insufficient documentation

## 2018-06-15 DIAGNOSIS — N183 Chronic kidney disease, stage 3 (moderate): Secondary | ICD-10-CM | POA: Diagnosis not present

## 2018-06-15 DIAGNOSIS — I129 Hypertensive chronic kidney disease with stage 1 through stage 4 chronic kidney disease, or unspecified chronic kidney disease: Secondary | ICD-10-CM | POA: Insufficient documentation

## 2018-06-15 DIAGNOSIS — S62102D Fracture of unspecified carpal bone, left wrist, subsequent encounter for fracture with routine healing: Secondary | ICD-10-CM

## 2018-06-15 DIAGNOSIS — D509 Iron deficiency anemia, unspecified: Secondary | ICD-10-CM | POA: Diagnosis present

## 2018-06-15 DIAGNOSIS — Z791 Long term (current) use of non-steroidal anti-inflammatories (NSAID): Secondary | ICD-10-CM | POA: Diagnosis not present

## 2018-06-15 DIAGNOSIS — Z96653 Presence of artificial knee joint, bilateral: Secondary | ICD-10-CM | POA: Diagnosis not present

## 2018-06-15 DIAGNOSIS — Z886 Allergy status to analgesic agent status: Secondary | ICD-10-CM | POA: Diagnosis not present

## 2018-06-15 DIAGNOSIS — Z8249 Family history of ischemic heart disease and other diseases of the circulatory system: Secondary | ICD-10-CM | POA: Insufficient documentation

## 2018-06-15 DIAGNOSIS — D649 Anemia, unspecified: Secondary | ICD-10-CM

## 2018-06-15 DIAGNOSIS — S62102A Fracture of unspecified carpal bone, left wrist, initial encounter for closed fracture: Secondary | ICD-10-CM | POA: Diagnosis not present

## 2018-06-15 DIAGNOSIS — X58XXXA Exposure to other specified factors, initial encounter: Secondary | ICD-10-CM | POA: Diagnosis not present

## 2018-06-15 DIAGNOSIS — Z888 Allergy status to other drugs, medicaments and biological substances status: Secondary | ICD-10-CM | POA: Insufficient documentation

## 2018-06-15 DIAGNOSIS — G894 Chronic pain syndrome: Secondary | ICD-10-CM | POA: Insufficient documentation

## 2018-06-15 DIAGNOSIS — K219 Gastro-esophageal reflux disease without esophagitis: Secondary | ICD-10-CM | POA: Diagnosis not present

## 2018-06-15 LAB — CBC WITH DIFFERENTIAL/PLATELET
BAND NEUTROPHILS: 0 %
BASOS ABS: 0.2 10*3/uL — AB (ref 0.0–0.1)
Basophils Relative: 3 %
Blasts: 0 %
Eosinophils Absolute: 0.2 10*3/uL (ref 0.0–0.5)
Eosinophils Relative: 4 %
HCT: 22.8 % — ABNORMAL LOW (ref 36.0–46.0)
Hemoglobin: 6.1 g/dL — CL (ref 12.0–15.0)
Lymphocytes Relative: 18 %
Lymphs Abs: 1 10*3/uL (ref 0.7–4.0)
MCH: 18.2 pg — ABNORMAL LOW (ref 26.0–34.0)
MCHC: 26.8 g/dL — ABNORMAL LOW (ref 30.0–36.0)
MCV: 67.9 fL — ABNORMAL LOW (ref 80.0–100.0)
METAMYELOCYTES PCT: 0 %
MONO ABS: 0.3 10*3/uL (ref 0.1–1.0)
Monocytes Relative: 5 %
Myelocytes: 0 %
Neutro Abs: 4 10*3/uL (ref 1.7–7.7)
Neutrophils Relative %: 70 %
Other: 0 %
Platelets: 192 10*3/uL (ref 150–400)
Promyelocytes Relative: 0 %
RBC: 3.36 MIL/uL — ABNORMAL LOW (ref 3.87–5.11)
RDW: 17.4 % — ABNORMAL HIGH (ref 11.5–15.5)
WBC: 5.7 10*3/uL (ref 4.0–10.5)
nRBC: 0 /100 WBC
nRBC: 0.5 % — ABNORMAL HIGH (ref 0.0–0.2)

## 2018-06-15 LAB — BASIC METABOLIC PANEL
Anion gap: 10 (ref 5–15)
BUN: 8 mg/dL (ref 8–23)
CO2: 22 mmol/L (ref 22–32)
Calcium: 8.6 mg/dL — ABNORMAL LOW (ref 8.9–10.3)
Chloride: 109 mmol/L (ref 98–111)
Creatinine, Ser: 0.94 mg/dL (ref 0.44–1.00)
GFR calc Af Amer: >60 mL/min (ref >60)
GFR calc non Af Amer: >60 mL/min (ref >60)
Glucose, Bld: 101 mg/dL — ABNORMAL HIGH (ref 70–99)
Potassium: 3.4 mmol/L — ABNORMAL LOW (ref 3.5–5.1)
Sodium: 141 mmol/L (ref 135–145)

## 2018-06-15 LAB — IRON AND TIBC
Iron: 11 ug/dL — ABNORMAL LOW (ref 28–170)
Saturation Ratios: 2 % — ABNORMAL LOW (ref 10.4–31.8)
TIBC: 468 ug/dL — ABNORMAL HIGH (ref 250–450)
UIBC: 457 ug/dL

## 2018-06-15 LAB — RETICULOCYTES
Immature Retic Fract: 30.7 % — ABNORMAL HIGH (ref 2.3–15.9)
RBC.: 3.08 MIL/uL — ABNORMAL LOW (ref 3.87–5.11)
RETIC CT PCT: 2.1 % (ref 0.4–3.1)
Retic Count, Absolute: 65.6 10*3/uL (ref 19.0–186.0)

## 2018-06-15 LAB — PREPARE RBC (CROSSMATCH)

## 2018-06-15 LAB — VITAMIN B12: Vitamin B-12: 675 pg/mL (ref 180–914)

## 2018-06-15 LAB — FERRITIN: Ferritin: 21 ng/mL (ref 11–307)

## 2018-06-15 LAB — FOLATE: Folate: 8.4 ng/mL (ref >5.9)

## 2018-06-15 MED ORDER — SODIUM CHLORIDE 0.9 % IV SOLN: 10.0000 mL/h | Freq: Once | INTRAVENOUS | Status: DC

## 2018-06-15 MED ORDER — ACETAMINOPHEN 325 MG PO TABS: 650.0000 mg | ORAL_TABLET | Freq: Four times a day (QID) | ORAL | Status: DC | PRN

## 2018-06-15 MED ORDER — FENTANYL CITRATE (PF) 100 MCG/2ML IJ SOLN
25.0000 ug | Freq: Once | INTRAMUSCULAR | Status: AC
  Administered 2018-06-15: 25 ug via INTRAVENOUS
  Filled 2018-06-15: qty 2

## 2018-06-15 MED ORDER — ONDANSETRON HCL 4 MG/2ML IJ SOLN: 4.0000 mg | Freq: Four times a day (QID) | INTRAMUSCULAR | Status: DC | PRN

## 2018-06-15 MED ORDER — ONDANSETRON HCL 4 MG PO TABS: 4.0000 mg | ORAL_TABLET | Freq: Four times a day (QID) | ORAL | Status: DC | PRN

## 2018-06-15 MED ORDER — HYDROMORPHONE HCL 1 MG/ML IJ SOLN
1.0000 mg | INTRAMUSCULAR | Status: DC | PRN
  Administered 2018-06-16: 1 mg via INTRAVENOUS
  Filled 2018-06-15 (×2): qty 1

## 2018-06-15 MED ORDER — SODIUM CHLORIDE 0.9 % IV BOLUS
500.0000 mL | Freq: Once | INTRAVENOUS | Status: AC
  Administered 2018-06-15: 500 mL via INTRAVENOUS

## 2018-06-15 MED ORDER — HYDROMORPHONE HCL 1 MG/ML IJ SOLN
1.0000 mg | Freq: Once | INTRAMUSCULAR | Status: AC
  Administered 2018-06-15: 1 mg via INTRAVENOUS
  Filled 2018-06-15: qty 1

## 2018-06-15 MED ORDER — ACETAMINOPHEN 650 MG RE SUPP: 650.0000 mg | Freq: Four times a day (QID) | RECTAL | Status: DC | PRN

## 2018-06-15 NOTE — H&P (Signed)
History and Physical    Kelli ButtsDeborah R Lull ZOX:[id number] DOB: 04/22/1956 DOA: 06/15/2018  PCP: Georgann HousekeeperHusain, Karrar, MD  Patient coming from: Home  I have personally briefly reviewed patient's old medical records in [facility]  Chief Complaint: Anemia  HPI: [name] is a 63 y.o. female with medical history significant of chronic low back pain on opiates.  Patient fell and broke wrist on 2/4.  Was being evaluated pre-op today and found to have incidental finding of anemia with HGB 6.  Sent in to ED.   ED Course: HGB 6.1, asymptomatic from anemia standpoint.   Review of Systems: As per HPI otherwise 10 point review of systems negative.   Past Medical History:  Diagnosis Date  . Anemia   . Anxiety   . Arthritis    "all over" (11/16/2016)  . Chronic lower back pain   . Chronic pain syndrome   . Chronic pain syndrome    "all over" (11/16/2016)  . CKD (chronic kidney disease), stage III (HCC)   . Daily headache   . Depression   . Fibromyalgia   . GERD (gastroesophageal reflux disease)   . High cholesterol   . History of blood transfusion    "related to hand laceration"  . History of hiatal hernia   . History of stomach ulcers    "bled when I was younger" (11/16/2016)  . Hypertension    hx; "they took me off my RX" (11/16/2016)  . Migraine 1970s  . Phlebitis of leg, right    "years ago" (11/16/2016)    Past Surgical History:  Procedure Laterality Date  . ABDOMINAL HYSTERECTOMY  1993   w/left ovary  . ANKLE FRACTURE SURGERY Bilateral   . APPENDECTOMY  1977  . BACK SURGERY    . CARDIAC CATHETERIZATION  2005   "negative"  . CHOLECYSTECTOMY OPEN  1977  . ESOPHAGOGASTRODUODENOSCOPY  2008   dx'd GERD  . FOREARM FRACTURE SURGERY Right   . HIP SURGERY Bilateral    "took out bone spurs"  . JOINT REPLACEMENT    . KNEE ARTHROSCOPY Bilateral    "total of 18 times"  . LACERATION REPAIR Right    hand; "went thru glass door"  . LEFT HEART CATH AND CORONARY  ANGIOGRAPHY N/A 11/18/2016   Procedure: Left Heart Cath and Coronary Angiography;  Surgeon: Yvonne KendallEnd, Christopher, MD;  [facility];  Service: Cardiovascular;  Laterality: N/A;  . LUMBAR FUSION     "put rods in"  . LUMBAR LAMINECTOMY/DECOMPRESSION MICRODISCECTOMY  05/2006  . REVISION TOTAL KNEE ARTHROPLASTY Bilateral   . RIGHT OOPHORECTOMY Right   . SHOULDER OPEN ROTATOR CUFF REPAIR Bilateral    left 04/2006  . TONSILLECTOMY    . TOTAL KNEE ARTHROPLASTY Bilateral    left 1998; right 2004  . TUBAL LIGATION       reports that she has never smoked. She has never used smokeless tobacco. She reports that she does not drink alcohol or use drugs.  Allergies  Allergen Reactions  . Aspirin Nausea Only  . Oxybutynin Rash    Family History  Problem Relation Age of Onset  . CAD Father 4340  . Lung cancer Father   . COPD Mother   . Emphysema Mother   . Other Sister        TRAP syndrome  . Diabetes Sister        boarderline     Prior to Admission medications   Medication Sig Start Date End Date Taking?  Authorizing Provider  Acetaminophen-Caffeine (EXCEDRIN ASPIRIN FREE PO) Take 2 tablets by mouth 3 (three) times daily as needed (pain).    Yes [provider]  ALPRAZolam Prudy Feeler) 1 MG tablet Take 0.5 mg by mouth 3 (three) times daily as needed for anxiety.    Yes [provider]  Alum & Mag Hydroxide-Simeth (MAALOX ADVANCED PO) Take as directed per bottle as needed for acid reflux.   Yes [provider]  cyclobenzaprine (FLEXERIL) 10 MG tablet Take 10 mg by mouth 2 (two) times daily as needed for muscle spasms.   Yes [provider]  FLUoxetine (PROZAC) 20 MG capsule Take 60 mg by mouth daily.    Yes [provider]  furosemide (LASIX) 20 MG tablet Take 10 mg to 20 mg by mouth daily as needed for swelling.   Yes [provider]  gabapentin (NEURONTIN) 100 MG capsule Take 200 mg by mouth 3 (three) times daily.    Yes [provider]  HYDROcodone-acetaminophen (NORCO/VICODIN) 5-325 MG tablet Take 1 tablet by mouth 3 (three) times daily.  10/17/16  Yes [provider]  lisinopril (PRINIVIL,ZESTRIL) 5 MG tablet Take 5 mg by mouth daily. 03/13/17  Yes [provider]  meloxicam (MOBIC) 15 MG tablet Take 15 mg by mouth daily.    Yes [provider]  nitroGLYCERIN (NITROSTAT) 0.4 MG SL tablet Place 1 tablet (0.4 mg total) under the tongue every 5 (five) minutes x 3 doses as needed for chest pain. 11/18/16  Yes Sharol Harness, Brittainy M, PA-C  omeprazole (PRILOSEC) 20 MG capsule Take 20 mg by mouth 2 (two) times daily. 03/08/17  Yes [provider]  promethazine (PHENERGAN) 25 MG tablet Take half (1/2) to one (1) tablet by mouth three (3) times daily as needed for nausea/vomitting. 05/17/17  Yes [provider]  ranitidine (ZANTAC) 150 MG tablet Take 150 mg by mouth daily.    Yes [provider]  rosuvastatin (CRESTOR) 20 MG tablet Take 1 tablet (20 mg total) by mouth daily at 6 PM. 11/28/17  Yes Lyn Records, MD    Physical Exam: Vitals:   06/15/18 1947 06/15/18 2030 06/15/18 2100 06/15/18 2130  BP:  (!) 142/75 136/68 (!) 125/95  Pulse: 90 91 93 90  Resp: (!) 24 (!) 23 (!) 22 (!) 25  Temp:      TempSrc:      SpO2: 97% 97% 99% 94%  Weight:      Height:        Constitutional: NAD, calm, comfortable Eyes: PERRL, lids and conjunctivae normal ENMT: Mucous membranes are moist. Posterior pharynx clear of any exudate or lesions.Normal dentition.  Neck: normal, supple, no masses, no thyromegaly Respiratory: clear to auscultation bilaterally, no wheezing, no crackles. Normal respiratory effort. No accessory muscle use.  Cardiovascular: Regular rate and rhythm, no murmurs / rubs / gallops. No extremity edema. 2+ pedal pulses. No carotid bruits.  Abdomen: no tenderness, no masses palpated. No hepatosplenomegaly. Bowel sounds positive.  Musculoskeletal: no clubbing /  cyanosis. No joint deformity upper and lower extremities. Good ROM, no contractures. Normal muscle tone.  Skin: no rashes, lesions, ulcers. No induration Neurologic: CN 2-12 grossly intact. Sensation intact, DTR normal. Strength 5/5 in all 4.  Psychiatric: Normal judgment and insight. Alert and oriented x 3. Normal mood.    Labs on Admission: I have personally reviewed following labs and imaging studies  CBC: Recent Labs  Lab 06/15/18 1915  WBC 5.7  NEUTROABS 4.0  HGB 6.1*  HCT 22.8*  MCV 67.9*  PLT 192   Basic Metabolic Panel: Recent Labs  Lab 06/15/18 1915  NA 141  K 3.4*  CL 109  CO2 22  GLUCOSE 101*  BUN 8  CREATININE 0.94  CALCIUM 8.6*   GFR: Estimated Creatinine Clearance: 68.2 mL/min (by C-G formula based on SCr of 0.94 mg/dL). Liver Function Tests: No results for input(s): AST, ALT, ALKPHOS, BILITOT, PROT, ALBUMIN in the last 168 hours. No results for input(s): LIPASE, AMYLASE in the last 168 hours. No results for input(s): AMMONIA in the last 168 hours. Coagulation Profile: No results for input(s): INR, PROTIME in the last 168 hours. Cardiac Enzymes: No results for input(s): CKTOTAL, CKMB, CKMBINDEX, TROPONINI in the last 168 hours. BNP (last 3 results) No results for input(s): PROBNP in the last 8760 hours. HbA1C: No results for input(s): HGBA1C in the last 72 hours. CBG: No results for input(s): GLUCAP in the last 168 hours. Lipid Profile: No results for input(s): CHOL, HDL, LDLCALC, TRIG, CHOLHDL, LDLDIRECT in the last 72 hours. Thyroid Function Tests: No results for input(s): TSH, T4TOTAL, FREET4, T3FREE, THYROIDAB in the last 72 hours. Anemia Panel: No results for input(s): VITAMINB12, FOLATE, FERRITIN, TIBC, IRON, RETICCTPCT in the last 72 hours. Urine analysis: No results found for: COLORURINE, APPEARANCEUR, LABSPEC, PHURINE, GLUCOSEU, HGBUR, BILIRUBINUR, KETONESUR, PROTEINUR, UROBILINOGEN, NITRITE, LEUKOCYTESUR  Radiological Exams on  Admission: No results found.  EKG: Independently reviewed.  Assessment/Plan Principal Problem:   Microcytic hypochromic anemia Active Problems:   Fracture of left wrist    1. Microcytic hypochromic anemia - 1. HGB 6.1 2. Transfusing 2u PRBC 3. Repeat H/H post transfusion 4. Anemia pnl ordered 5. No stigmata of GIB, and apparently just had colonoscopy and EGD as screening exam recently 6. Possibly some sort of familial iron deficiency anemia? Sister has iron def anemia, has had GI work up for this which was negative, also has hysterectomy. 7. Will order hemoccult 2. L wrist fx - 1. Surgery scheduled for Monday 2. Dilaudid PRN pain  DVT prophylaxis: SCDs Code Status: Full Family Communication: Family at bedside Disposition Plan: Home after admit Consults called: None Admission status: Place in 30    ,  M. DO [facility]  How to contact the [facility] Attending or Consulting provider 7A - 7P or covering provider during after hours 7P -7A, for this patient?  1. Check the care team in [facility] and look for a) attending/consulting [facility] provider listed and b) the [facility] team listed 2. Log into [email or web address]  Amion Physician Scheduling and messaging for groups and whole hospitals  On call and physician scheduling software for group practices, residents, hospitalists and other medical providers for call, clinic, rotation and shift schedules. On[employer] is a hospital-wide system for scheduling doctors and paging doctors on call. EasyPlot is for scientific plotting and data analysis.  [email or web address]  and use [place]'s universal password to access. If you do not have the password, please contact the hospital operator.  3. Locate the [facility] provider you are looking for under [facility] and page to a number that you can be directly reached. 4. If you still have difficulty reaching the provider, please page the [facility] (Director on Call) for the Hospitalists listed on  amion for assistance.  06/15/2018, 10:03 PM

## 2018-06-15 NOTE — ED Notes (Signed)
Report given to 5N RN. All questions answered 

## 2018-06-15 NOTE — ED Triage Notes (Signed)
Patient sent over here from surgery due to low hemoglobin. Patient has bruising to face and left arm wrapped due to fall a few days ago. Denies any dark stools. No blood thinners. Reports intermittent dizziness.

## 2018-06-15 NOTE — Progress Notes (Signed)
I spoke to [facility] , Dr Glenna Durand scheduler, I was called for orders.  Morrie Sheldon reported to be that Ms Pullium will be admitted to the hospital today, " hemoglobin is 6".

## 2018-06-15 NOTE — H&P (Signed)
[name] is an 63 y.o. female.   Chief Complaint: LEFT WRIST INJURY   HPI: the patient is a 63 year old right hand dominant female who fell out of bed on February 4 causing injury to the left wrist.  She was seen in the emergency department for initial treatment with radiographs in a sugar tong splint. The patient was seen in our office for further evaluation.  Discussed the reason and rationale for surgery and internal fixation to repair the fracture. The patient will remain in the sugar tong splint until time of surgery. The patient is here today for surgery. She denies chest pain, shortness of breath, nausea, vomiting, diarrhea, fever, or chills.   Past Medical History:  Diagnosis Date  . Anemia   . Anxiety   . Arthritis    "all over" (11/16/2016)  . Chronic lower back pain   . Chronic pain syndrome   . Chronic pain syndrome    "all over" (11/16/2016)  . CKD (chronic kidney disease), stage III (HCC)   . Daily headache   . Depression   . Fibromyalgia   . GERD (gastroesophageal reflux disease)   . High cholesterol   . History of blood transfusion    "related to hand laceration"  . History of hiatal hernia   . History of stomach ulcers    "bled when I was younger" (11/16/2016)  . Hypertension    hx; "they took me off my RX" (11/16/2016)  . Migraine 1970s  . Phlebitis of leg, right    "years ago" (11/16/2016)    Past Surgical History:  Procedure Laterality Date  . ABDOMINAL HYSTERECTOMY  1993   w/left ovary  . ANKLE FRACTURE SURGERY Bilateral   . APPENDECTOMY  1977  . BACK SURGERY    . CARDIAC CATHETERIZATION  2005   "negative"  . CHOLECYSTECTOMY OPEN  1977  . ESOPHAGOGASTRODUODENOSCOPY  2008   dx'd GERD  . FOREARM FRACTURE SURGERY Right   . HIP SURGERY Bilateral    "took out bone spurs"  . JOINT REPLACEMENT    . KNEE ARTHROSCOPY Bilateral    "total of 18 times"  . LACERATION REPAIR Right    hand; "went thru glass door"  . LEFT HEART CATH AND CORONARY  ANGIOGRAPHY N/A 11/18/2016   Procedure: Left Heart Cath and Coronary Angiography;  Surgeon: Yvonne Kendall, MD;  Location: [facility] INV[facility];  Service: Cardiovascular;  Laterality: N/A;  . LUMBAR FUSION     "put rods in"  . LUMBAR LAMINECTOMY/DECOMPRESSION MICRODISCECTOMY  05/2006  . REVISION TOTAL KNEE ARTHROPLASTY Bilateral   . RIGHT OOPHORECTOMY Right   . SHOULDER OPEN ROTATOR CUFF REPAIR Bilateral    left 04/2006  . TONSILLECTOMY    . TOTAL KNEE ARTHROPLASTY Bilateral    left 1998; right 2004  . TUBAL LIGATION      Family History  Problem Relation Age of Onset  . CAD Father 38  . Lung cancer Father   . COPD Mother   . Emphysema Mother   . Other Sister        TRAP syndrome  . Diabetes Sister        boarderline   Social History:  reports that she has never smoked. She has never used smokeless tobacco. She reports that she does not drink alcohol or use drugs.  Allergies:  Allergies  Allergen Reactions  . Aspirin Nausea Only  . Oxybutynin Rash    No medications prior to admission.    No results found for  this or any previous visit (from the past 48 hour(s)). No results found.  ROS NO RECENT ILLNESSES OR HOSPITALIZATIONS  There were no vitals taken for this visit. Physical Exam  General Appearance:  Alert, cooperative, no distress, appears stated age  Head:  Normocephalic, without obvious abnormality, atraumatic  Eyes:  Pupils equal, conjunctiva/corneas clear,         Throat: Lips, mucosa, and tongue normal; teeth and gums normal  Neck: No visible masses     Lungs:   respirations unlabored  Chest Wall:  No tenderness or deformity  Heart:  Regular rate and rhythm,  Abdomen:   Soft, non-tender,         Extremities: LUE: TENDERNESS OF THE LEFT WRIST WITH ECCHYMOSIS AND SWELLING PRESENT.  NO OPEN WOUNDS, ERYTHEMA, OR DRAINAGE.  CAPILLARY REFILL LESS THAN 2 SECONDS.  SENSATION INTACT TO LIGHT TOUCH DISTALLY.  ABLE TO WIGGLE ALL DIGITS WITH MILD DIFFICULTY.   Pulses: 2+ and symmetric  Skin: Skin color, texture, turgor normal, no rashes or lesions     Neurologic: Normal    Assessment LEFT DISTAL RADIUS DISPLACED FRACTURE    Plan LEFT DISTAL RADIUS OPEN REDUCTION AND INTERNAL FIXATION WITH REPAIR AS INDICATED  RISKS INCLUDE NOT LIMITED TO BLEEDING,INFECTION, NERVE,ARTERY, TENDON DAMAGE, NONUNION,MALUNION, HARDWARE FAILURE, LOSS OF MOTION OF FINGERS,WRIST,FOREARM,ELBOW AND NEED FOR FURTHER SURGERY.  R/B/A DISCUSSED WITH PT IN OFFICE.  PT VOICED UNDERSTANDING OF PLAN CONSENT SIGNED DAY OF SURGERY PT SEEN AND EXAMINED PRIOR TO OPERATIVE PROCEDURE/DAY OF SURGERY SITE MARKED. QUESTIONS ANSWERED WILL GO HOME FOLLOWING SURGERY   WE ARE PLANNING SURGERY FOR YOUR UPPER EXTREMITY. THE RISKS AND BENEFITS OF SURGERY INCLUDE BUT NOT LIMITED TO BLEEDING INFECTION, DAMAGE TO NEARBY NERVES ARTERIES TENDONS, FAILURE OF SURGERY TO ACCOMPLISH ITS INTENDED GOALS, PERSISTENT SYMPTOMS AND NEED FOR FURTHER SURGICAL INTERVENTION. WITH THIS IN MIND WE WILL PROCEED. I HAVE DISCUSSED WITH THE PATIENT THE PRE AND POSTOPERATIVE REGIMEN AND THE DOS AND DON'TS. PT VOICED UNDERSTANDING AND INFORMED CONSENT SIGNED.   Bradly Bienenstock MD 06/18/18  Karma Greaser 06/15/2018, 5:49 PM

## 2018-06-15 NOTE — ED Provider Notes (Signed)
[facility] EMERGENCY DEPARTMENT Provider Note   CSN: [id number] Arrival date & time: 06/15/18  1842     History   Chief Complaint Chief Complaint  Patient presents with  . Low hemoglobin    HPI [name] is a 63 y.o. female.  HPI Patient with multiple medical issues, occluding chronic pain, presents with concern of weakness. Patient has notable history of fall, sustaining injuries to her left forearm, right face including fracture of the former. This occurred about 1 week ago, and today the patient scheduled outpatient surgical repair of fractures. However, during preoperative preparation the patient was unable anemia, with a hemoglobin about 7. After repeat values were similar she was discharged, without the anticipated surgery. She largely complains currently of pain in her left forearm around the fracture site, as well as generalized discomfort, and fatigue with exertion. Patient does have a history of anemia, though she is unsure of what type. Rest symptoms are largely related to pain in her left arm, but with activity shoes progressively weak, fatigued, with mild dyspnea. No dyspnea at rest, no chest pain throughout, no abdominal pain throughout, no fever, no cough.  Past Medical History:  Diagnosis Date  . Anemia   . Anxiety   . Arthritis    "all over" (11/16/2016)  . Chronic lower back pain   . Chronic pain syndrome   . Chronic pain syndrome    "all over" (11/16/2016)  . CKD (chronic kidney disease), stage III (HCC)   . Daily headache   . Depression   . Fibromyalgia   . GERD (gastroesophageal reflux disease)   . High cholesterol   . History of blood transfusion    "related to hand laceration"  . History of hiatal hernia   . History of stomach ulcers    "bled when I was younger" (11/16/2016)  . Hypertension    hx; "they took me off my RX" (11/16/2016)  . Migraine 1970s  . Phlebitis of leg, right    "years ago" (11/16/2016)     Patient Active Problem List   Diagnosis Date Noted  . Lumbar spondylosis 04/27/2017  . Cervical radiculopathy 04/27/2017  . 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 Coronary artery disease involving native coronary artery of native heart with angina pectoris (HCC)   . Pure hypercholesterolemia   . Abnormal stress test   . Chest pain with moderate risk for cardiac etiology   . Prolonged QT interval   . Bradycardia   . 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 ABDOMINAL HYSTERECTOMY  1993   w/left ovary  . ANKLE FRACTURE SURGERY Bilateral   . APPENDECTOMY  1977  . BACK SURGERY    . CARDIAC CATHETERIZATION  2005   "negative"  . CHOLECYSTECTOMY OPEN  1977  . ESOPHAGOGASTRODUODENOSCOPY  2008   dx'd GERD  . FOREARM FRACTURE SURGERY Right   . HIP SURGERY Bilateral    "took out bone spurs"  . JOINT REPLACEMENT    . KNEE ARTHROSCOPY Bilateral    "total of 18 times"  . LACERATION REPAIR Right    hand; "went thru glass door"  . LEFT HEART CATH AND CORONARY ANGIOGRAPHY N/A 11/18/2016   Procedure: Left Heart Cath and Coronary Angiography;  Surgeon: Yvonne Kendall, MD;  Location: [facility] INVASIVE CV LAB;  Service: Cardiovascular;  Laterality: N/A;  . LUMBAR FUSION     "put rods in"  . LUMBAR LAMINECTOMY/DECOMPRESSION MICRODISCECTOMY  05/2006  . REVISION  TOTAL KNEE ARTHROPLASTY Bilateral   . RIGHT OOPHORECTOMY Right   . SHOULDER OPEN ROTATOR CUFF REPAIR Bilateral    left 04/2006  . TONSILLECTOMY    . TOTAL KNEE ARTHROPLASTY Bilateral    left 1998; right 2004  . TUBAL LIGATION       OB History   No obstetric history on file.      Home Medications    Prior to Admission medications   Medication Sig Start Date End Date Taking? Authorizing Provider  Acetaminophen-Caffeine (EXCEDRIN ASPIRIN FREE PO) Take 2 tablets by mouth 3 (three) times daily as needed (pain).    Yes [provider]  ALPRAZolam Prudy Feeler(XANAX) 1 MG  tablet Take 0.5 mg by mouth 3 (three) times daily as needed for anxiety.    Yes [provider]  Alum & Mag Hydroxide-Simeth (MAALOX ADVANCED PO) Take as directed per bottle as needed for acid reflux.   Yes [provider]  cyclobenzaprine (FLEXERIL) 10 MG tablet Take 10 mg by mouth 2 (two) times daily as needed for muscle spasms.   Yes [provider]  FLUoxetine (PROZAC) 20 MG capsule Take 60 mg by mouth daily.    Yes [provider]  furosemide (LASIX) 20 MG tablet Take 10 mg to 20 mg by mouth daily as needed for swelling.   Yes [provider]  gabapentin (NEURONTIN) 100 MG capsule Take 200 mg by mouth 3 (three) times daily.    Yes [provider]  HYDROcodone-acetaminophen (NORCO/VICODIN) 5-325 MG tablet Take 1 tablet by mouth 3 (three) times daily.  10/17/16  Yes [provider]  lisinopril (PRINIVIL,ZESTRIL) 5 MG tablet Take 5 mg by mouth daily. 03/13/17  Yes [provider]  meloxicam (MOBIC) 15 MG tablet Take 15 mg by mouth daily.    Yes [provider]  nitroGLYCERIN (NITROSTAT) 0.4 MG SL tablet Place 1 tablet (0.4 mg total) under the tongue every 5 (five) minutes x 3 doses as needed for chest pain. 11/18/16  Yes Sharol HarnessSimmons, Brittainy M, PA-C  omeprazole (PRILOSEC) 20 MG capsule Take 20 mg by mouth 2 (two) times daily. 03/08/17  Yes [provider]  promethazine (PHENERGAN) 25 MG tablet Take half (1/2) to one (1) tablet by mouth three (3) times daily as needed for nausea/vomitting. 05/17/17  Yes [provider]  ranitidine (ZANTAC) 150 MG tablet Take 150 mg by mouth daily.    Yes [provider]  rosuvastatin (CRESTOR) 20 MG tablet Take 1 tablet (20 mg total) by mouth daily at 6 PM. 11/28/17  Yes Lyn RecordsSmith, Henry W, MD    Family History Family History  Problem Relation Age of Onset  . CAD Father 3840  . Lung cancer Father   . COPD Mother   . Emphysema Mother   . Other Sister        TRAP  syndrome  . Diabetes Sister        boarderline    Social History Social History   Tobacco Use  . Smoking status: Never Smoker  . Smokeless tobacco: Never Used  Substance Use Topics  . Alcohol use: No  . Drug use: No     Allergies   Aspirin and Oxybutynin   Review of Systems Review of Systems  Constitutional:       Per HPI, otherwise negative  HENT:       Per HPI, otherwise negative  Respiratory:       Per HPI, otherwise negative  Cardiovascular:  Per HPI, otherwise negative  Gastrointestinal: Negative for vomiting.       Patient denies any history of melena, dark stool, hematemesis  Endocrine:       Negative aside from HPI  Genitourinary:       Neg aside from HPI   Musculoskeletal:       Per HPI, otherwise negative  Skin: Negative.   Neurological: Positive for weakness. Negative for syncope.  Psychiatric/Behavioral: The patient is nervous/anxious.      Physical Exam Updated Vital Signs BP 137/74 (BP Location: Right Arm)   Pulse 90   Temp 98.9 F (37.2 C) (Oral)   Resp (!) 24   Ht 5\' 3"  (1.6 m)   Wt 99.9 kg   SpO2 97%   BMI 39.01 kg/m   Physical Exam Vitals signs and nursing note reviewed.  Constitutional:      General: She is not in acute distress.    Appearance: She is well-developed.     Comments: Uncomfortable appearing obese female awake and alert sitting upright, speaking clearly  HENT:     Head: Normocephalic and atraumatic.  Eyes:     Conjunctiva/sclera: Conjunctivae normal.  Pulmonary:     Effort: Pulmonary effort is normal. No respiratory distress.     Breath sounds: Normal breath sounds. No stridor.  Abdominal:     General: There is no distension.  Musculoskeletal:     Comments: Left arm in splint with Ace wrap in place throughout the area just proximal to the elbow throughout the distal extremity  Skin:    General: Skin is warm and dry.  Neurological:     Mental Status: She is alert and oriented to person, place, and time.      Cranial Nerves: No cranial nerve deficit.      ED Treatments / Results  Labs (all labs ordered are listed, but only abnormal results are displayed) Labs Reviewed  BASIC METABOLIC PANEL - Abnormal; Notable for the following components:      Result Value   Potassium 3.4 (*)    Glucose, Bld 101 (*)    Calcium 8.6 (*)    All other components within normal limits  CBC WITH DIFFERENTIAL/PLATELET - Abnormal; Notable for the following components:   RBC 3.36 (*)    Hemoglobin 6.1 (*)    HCT 22.8 (*)    MCV 67.9 (*)    MCH 18.2 (*)    MCHC 26.8 (*)    RDW 17.4 (*)    nRBC 0.5 (*)    All other components within normal limits  TYPE AND SCREEN    Procedures Procedures (including critical care time)  Medications Ordered in ED Medications  sodium chloride 0.9 % bolus 500 mL (500 mLs Intravenous New Bag/Given 06/15/18 1946)  fentaNYL (SUBLIMAZE) injection 25 mcg (25 mcg Intravenous Given 06/15/18 1944)     Initial Impression / Assessment and Plan / ED Course  I have reviewed the triage vital signs and the nursing notes.  Pertinent labs & imaging results that were available during my care of the patient were reviewed by me and considered in my medical decision making (see chart for details).    Update:, Initial labs notable for hemoglobin value of 6.1.  Patient has a type and screen, will prepare for transfusion.   8:07 PM This elderly female with recent fall, required outpatient procedure, now presents due to weakness, dyspnea with activity, is found to have anemia, beyond baseline of 9, now 6.1. No obvious source of bleeding,  and with her history of anemia, as well as CKD, there is suspicion for chronic disease, possibly exacerbated by her recent stressful episode. With no evidence for active bleeding, no hypotension, the patient was started on transfusion, admitted for further evaluation, monitoring, management.  Final Clinical Impressions(s) / ED Diagnoses  Symptomatic  anemia   CRITICAL CARE Performed by: Gerhard Munch Total critical care time: 35 minutes Critical care time was exclusive of separately billable procedures and treating other patients. Critical care was necessary to treat or prevent imminent or life-threatening deterioration. Critical care was time spent personally by me on the following activities: development of treatment plan with patient and/or surrogate as well as nursing, discussions with consultants, evaluation of patient's response to treatment, examination of patient, obtaining history from patient or surrogate, ordering and performing treatments and interventions, ordering and review of laboratory studies, ordering and review of radiographic studies, pulse oximetry and re-evaluation of patient's condition.    Gerhard Munch, MD 06/15/18 2351

## 2018-06-16 DIAGNOSIS — D509 Iron deficiency anemia, unspecified: Secondary | ICD-10-CM | POA: Diagnosis not present

## 2018-06-16 LAB — BASIC METABOLIC PANEL
Anion gap: 12 (ref 5–15)
BUN: 7 mg/dL — ABNORMAL LOW (ref 8–23)
CO2: 22 mmol/L (ref 22–32)
Calcium: 8.6 mg/dL — ABNORMAL LOW (ref 8.9–10.3)
Chloride: 109 mmol/L (ref 98–111)
Creatinine, Ser: 0.9 mg/dL (ref 0.44–1.00)
GFR calc Af Amer: >60 mL/min (ref >60)
GFR calc non Af Amer: >60 mL/min (ref >60)
Glucose, Bld: 86 mg/dL (ref 70–99)
Potassium: 3.3 mmol/L — ABNORMAL LOW (ref 3.5–5.1)
Sodium: 143 mmol/L (ref 135–145)

## 2018-06-16 LAB — HIV ANTIBODY (ROUTINE TESTING W REFLEX): HIV Screen 4th Generation wRfx: NONREACTIVE

## 2018-06-16 LAB — CBC
HCT: 30 % — ABNORMAL LOW (ref 36.0–46.0)
Hemoglobin: 8.5 g/dL — ABNORMAL LOW (ref 12.0–15.0)
MCH: 20.3 pg — ABNORMAL LOW (ref 26.0–34.0)
MCHC: 28.3 g/dL — AB (ref 30.0–36.0)
MCV: 71.8 fL — ABNORMAL LOW (ref 80.0–100.0)
Platelets: 216 10*3/uL (ref 150–400)
RBC: 4.18 MIL/uL (ref 3.87–5.11)
RDW: 20 % — ABNORMAL HIGH (ref 11.5–15.5)
WBC: 5.9 10*3/uL (ref 4.0–10.5)
nRBC: 0 % (ref 0.0–0.2)

## 2018-06-16 MED ORDER — HYDROCODONE-ACETAMINOPHEN 5-325 MG PO TABS
1.0000 | ORAL_TABLET | Freq: Three times a day (TID) | ORAL | Status: DC
  Administered 2018-06-16: 1 via ORAL
  Filled 2018-06-16: qty 1

## 2018-06-16 MED ORDER — FERROUS GLUCONATE 324 (38 FE) MG PO TABS: 324.0000 mg | ORAL_TABLET | Freq: Every day | ORAL | Status: DC

## 2018-06-16 MED ORDER — POTASSIUM CHLORIDE CRYS ER 20 MEQ PO TBCR
40.0000 meq | EXTENDED_RELEASE_TABLET | Freq: Once | ORAL | Status: AC
  Administered 2018-06-16: 40 meq via ORAL
  Filled 2018-06-16: qty 2

## 2018-06-16 NOTE — Progress Notes (Addendum)
[facility] called requesting instructions for patient who is being discharged. I typed up instructions and Noralyn Pick stated she would provide instructions to patient.   Dr. Michelle Piper notified of Hgb.

## 2018-06-16 NOTE — Plan of Care (Signed)
  Problem: Health Behavior/Discharge Planning: Goal: Ability to manage health-related needs will improve Outcome: Progressing   Problem: Activity: Goal: Risk for activity intolerance will decrease Outcome: Progressing   Problem: Nutrition: Goal: Adequate nutrition will be maintained Outcome: Progressing   Problem: Coping: Goal: Level of anxiety will decrease Outcome: Progressing   

## 2018-06-16 NOTE — Pre-Procedure Instructions (Signed)
    [name]  06/16/2018       Your procedure is scheduled on February 10.  Report to [facility] Admitting at 1:15 P.M.  Call this number if you have problems the morning of surgery:  [phone]   Remember:  Do not eat or drink after midnight.     Take these medicines the morning of surgery with A SIP OF WATER Xanax (if needed), Flexeril, Fluoxetine, Gabapentin, Hydrocodone, Omeprazole, Ranitidine, Phenergan (if needed)    Do not wear jewelry, make-up or nail polish.  Do not wear lotions, powders, or perfumes, or deodorant.  Do not shave 48 hours prior to surgery.  Men may shave face and neck.  Do not bring valuables to the hospital.  [facility] is not responsible for any belongings or valuables.  Contacts, dentures or bridgework may not be worn into surgery.  Leave your suitcase in the car.  After surgery it may be brought to your room.  For patients admitted to the hospital, discharge time will be determined by your treatment team.  Patients discharged the day of surgery will not be allowed to drive home.         

## 2018-06-16 NOTE — ED Notes (Signed)
Wasting 75 mcg of Fentanyl after pt was taken to [facility]. Witnessed with Curator

## 2018-06-16 NOTE — Discharge Summary (Signed)
Physician Discharge Summary  [name] ORV:[id number] DOB: 1955/06/04 DOA: 06/15/2018  PCP: Kelli Housekeeper, MD  Admit date: 06/15/2018 Discharge date: 06/16/2018  Admitted From: Home Disposition: Home  Recommendations for Outpatient Follow-up:  1. Follow up with PCP in 1 week 2. Please obtain BMP/CBC in one week 3. Please follow up on the following pending results: None  Home Health: None Equipment/Devices: None  Discharge Condition: Stable CODE STATUS: Full code Diet recommendation: Heart healthy   Brief/Interim Summary:  Admission HPI written by Hillary Bow, DO   Chief Complaint: Anemia  HPI: [name] is a 63 y.o. female with medical history significant of chronic low back pain on opiates.  Patient fell and broke wrist on 2/4.  Was being evaluated pre-op today and found to have incidental finding of anemia with HGB 6.  Sent in to ED.   ED Course: HGB 6.1, asymptomatic from anemia standpoint.    Hospital course:  Chronic anemia Low hemoglobin on admission. Patient is pre-op. Asymptomatic. Likely chronic with possible contribution from fall and facial hematoma. No evidence of acute hemorrhage/blood loss. Received 2 units with improvement of hemoglobin from 6.1 to 8.5. Iron panel suggests iron deficiency. Iron supplementation on discharge. Recent colonoscopy/EGD (records unavailable). Outpatient follow-up.  Right face hematoma Secondary to fall. Likely contributed to anemia. Stable.  Left wrist fracture Stable. Outpatient surgery planned.  Discharge Diagnoses:  Principal Problem:   Microcytic hypochromic anemia Active Problems:   Fracture of left wrist    Discharge Instructions  Discharge Instructions    Call MD for:  extreme fatigue   Complete by:  As directed    Call MD for:  persistant dizziness or light-headedness   Complete by:  As directed    Diet - low sodium heart healthy   Complete by:  As directed    Increase activity  slowly   Complete by:  As directed      Allergies as of 06/16/2018      Reactions   Aspirin Nausea Only   Oxybutynin Rash      Medication List    STOP taking these medications   meloxicam 15 MG tablet Commonly known as:  MOBIC     TAKE these medications   ALPRAZolam 1 MG tablet Commonly known as:  XANAX Take 0.5 mg by mouth 3 (three) times daily as needed for anxiety.   cyclobenzaprine 10 MG tablet Commonly known as:  FLEXERIL Take 10 mg by mouth 2 (two) times daily as needed for muscle spasms.   EXCEDRIN ASPIRIN FREE PO Take 2 tablets by mouth 3 (three) times daily as needed (pain).   ferrous gluconate 324 MG tablet Commonly known as:  FERGON Take 1 tablet (324 mg total) by mouth daily with breakfast.   FLUoxetine 20 MG capsule Commonly known as:  PROZAC Take 60 mg by mouth daily.   furosemide 20 MG tablet Commonly known as:  LASIX Take 10 mg to 20 mg by mouth daily as needed for swelling.   gabapentin 100 MG capsule Commonly known as:  NEURONTIN Take 200 mg by mouth 3 (three) times daily.   HYDROcodone-acetaminophen 5-325 MG tablet Commonly known as:  NORCO/VICODIN Take 1 tablet by mouth 3 (three) times daily.   lisinopril 5 MG tablet Commonly known as:  PRINIVIL,ZESTRIL Take 5 mg by mouth daily.   MAALOX ADVANCED PO Take as directed per bottle as needed for acid reflux.   nitroGLYCERIN 0.4 MG SL tablet Commonly known as:  NITROSTAT  Place 1 tablet (0.4 mg total) under the tongue every 5 (five) minutes x 3 doses as needed for chest pain.   omeprazole 20 MG capsule Commonly known as:  PRILOSEC Take 20 mg by mouth 2 (two) times daily.   promethazine 25 MG tablet Commonly known as:  PHENERGAN Take half (1/2) to one (1) tablet by mouth three (3) times daily as needed for nausea/vomitting.   ranitidine 150 MG tablet Commonly known as:  ZANTAC Take 150 mg by mouth daily.   rosuvastatin 20 MG tablet Commonly known as:  CRESTOR Take 1 tablet (20 mg  total) by mouth daily at 6 PM.       Allergies  Allergen Reactions  . Aspirin Nausea Only  . Oxybutynin Rash    Consultations:  None   Procedures/Studies: Dg Elbow Complete Left  Result Date: 06/12/2018 CLINICAL DATA:  Fall from bed this morning.  LEFT arm pain. EXAM: LEFT ELBOW - COMPLETE 3+ VIEW COMPARISON:  None. FINDINGS: There is no evidence of fracture, dislocation, or joint effusion. No advanced arthropathy or other focal bone abnormality. Soft tissues are unremarkable. IMPRESSION: Negative. Electronically Signed   By: Awilda Metro M.D.   On: 06/12/2018 06:09   Dg Forearm Left  Result Date: 06/12/2018 CLINICAL DATA:  Fall from bed this morning.  Arm pain. EXAM: LEFT HAND - COMPLETE 3+ VIEW; LEFT FOREARM - 2 VIEW COMPARISON:  LEFT wrist radiograph June 12, 2018 at 0240 hours. FINDINGS: LEFT hand: Comminuted intra-articular distal radial fracture with dorsal angulation distal bony fragments. Protrude burn calcification in dorsum of the wrist. Displaced ulnar styloid fracture. No dislocation. No additional fractures. No destructive bony lesions. Wrist soft tissue swelling without subcutaneous gas or radiopaque foreign bodies. LEFT forearm: Wrist fractures. No additional fractures. No destructive bony lesions. Wrist soft tissue swelling. IMPRESSION: 1. Stable appearance of acute wrist fractures. Possible acute triquetrum fracture. Electronically Signed   By: Awilda Metro M.D.   On: 06/12/2018 06:12   Dg Wrist Complete Left  Result Date: 06/12/2018 CLINICAL DATA:  Patient fell out of bed tonight. Left wrist pain and deformity. EXAM: LEFT WRIST - COMPLETE 3+ VIEW COMPARISON:  Bilateral hands joint survey 02/02/2010 FINDINGS: Comminuted, impacted, and burst fractures of the distal left radial metaphysis with fracture lines extending to the radioulnar and radiocarpal joints. Dorsal angulation of the distal fracture fragments. Associated fractures of the left ulnar styloid process  with displacement. Diffuse soft tissue swelling. Small circumscribed lucent lesion in the lunate likely representing a small cyst. Degenerative changes in the STT joints. IMPRESSION: Comminuted, impacted, and burst fractures of the distal left radial metaphysis with extension to the radioulnar and radiocarpal joints. Associated displaced fractures of the left ulnar styloid process. Electronically Signed   By: Burman Nieves M.D.   On: 06/12/2018 03:03   Ct Head Wo Contrast  Result Date: 06/12/2018 CLINICAL DATA:  Patient fell out of bed striking the face. Swelling and bruising to the right periorbital region. EXAM: CT HEAD WITHOUT CONTRAST CT MAXILLOFACIAL WITHOUT CONTRAST TECHNIQUE: Multidetector CT imaging of the head and maxillofacial structures were performed using the standard protocol without intravenous contrast. Multiplanar CT image reconstructions of the maxillofacial structures were also generated. COMPARISON:  11/16/2016 CT head. FINDINGS: CT HEAD FINDINGS Brain: No evidence of acute infarction, hemorrhage, hydrocephalus, extra-axial collection or mass lesion/mass effect. Mild diffuse cerebral atrophy. Vascular: Moderate intracranial arterial vascular calcifications. Skull: Calvarium appears intact. No acute depressed skull fractures. Other: None. CT MAXILLOFACIAL FINDINGS Osseous: Frontal bones, orbital rims,  nasal bones, maxillary antral walls, pterygoid plates, zygomatic arches, mandibles, and temporomandibular joints are intact. No acute displaced fractures identified. No focal bone lesions. Orbits: Right periorbital hematoma. No retrobulbar extension. Globes and extraocular muscles appear intact and symmetrical. Sinuses: Paranasal sinuses and mastoid air cells are clear. Soft tissues: Soft tissue swelling and hematoma extending inferiorly from the right older region over the maxillary and mandibular region. Circumscribed hematoma in the soft tissues over the right cheek measuring 2.6 cm  diameter. IMPRESSION: 1. No acute intracranial abnormalities. 2. No acute displaced orbital or facial fractures identified. Right periorbital and facial soft tissue hematomas. Electronically Signed   By: Burman NievesWilliam  Stevens M.D.   On: 06/12/2018 03:14   Dg Shoulder Left  Result Date: 06/12/2018 CLINICAL DATA:  Fall from bed this morning.  Arm pain. EXAM: LEFT SHOULDER - 2+ VIEW COMPARISON:  None. FINDINGS: The humeral head is well-formed and located. The subacromial, glenohumeral joint spaces are intact. Moderate acromioclavicular osteoarthrosis. Offset of the AC joint on Y-view is likely projectional. No destructive bony lesions. Soft tissue planes are non-suspicious. IMPRESSION: No acute fracture deformity or dislocation. Electronically Signed   By: Awilda Metroourtnay  Bloomer M.D.   On: 06/12/2018 06:10   Dg Hand Complete Left  Result Date: 06/12/2018 CLINICAL DATA:  Fall from bed this morning.  Arm pain. EXAM: LEFT HAND - COMPLETE 3+ VIEW; LEFT FOREARM - 2 VIEW COMPARISON:  LEFT wrist radiograph June 12, 2018 at 0240 hours. FINDINGS: LEFT hand: Comminuted intra-articular distal radial fracture with dorsal angulation distal bony fragments. Protrude burn calcification in dorsum of the wrist. Displaced ulnar styloid fracture. No dislocation. No additional fractures. No destructive bony lesions. Wrist soft tissue swelling without subcutaneous gas or radiopaque foreign bodies. LEFT forearm: Wrist fractures. No additional fractures. No destructive bony lesions. Wrist soft tissue swelling. IMPRESSION: 1. Stable appearance of acute wrist fractures. Possible acute triquetrum fracture. Electronically Signed   By: Awilda Metroourtnay  Bloomer M.D.   On: 06/12/2018 06:12   Dg Hip Unilat W Or Wo Pelvis 2-3 Views Right  Result Date: 06/12/2018 CLINICAL DATA:  Right hip pain after fall out of bed tonight. EXAM: DG HIP (WITH OR WITHOUT PELVIS) 2-3V RIGHT COMPARISON:  None. FINDINGS: Degenerative changes in the lower lumbar spine and hips.  Postoperative changes in the lumbar spine. Pelvis and right hip appear intact. No evidence of acute fracture or dislocation. No focal bone lesion or bone destruction. Bone cortex appears intact. Visualized sacrum appears intact. SI joints and symphysis pubis are not displaced. Soft tissues are unremarkable. IMPRESSION: Degenerative changes in the lower lumbar spine and hips. No acute bony abnormalities. Electronically Signed   By: Burman NievesWilliam  Stevens M.D.   On: 06/12/2018 03:04   Ct Maxillofacial Wo Contrast  Result Date: 06/12/2018 CLINICAL DATA:  Patient fell out of bed striking the face. Swelling and bruising to the right periorbital region. EXAM: CT HEAD WITHOUT CONTRAST CT MAXILLOFACIAL WITHOUT CONTRAST TECHNIQUE: Multidetector CT imaging of the head and maxillofacial structures were performed using the standard protocol without intravenous contrast. Multiplanar CT image reconstructions of the maxillofacial structures were also generated. COMPARISON:  11/16/2016 CT head. FINDINGS: CT HEAD FINDINGS Brain: No evidence of acute infarction, hemorrhage, hydrocephalus, extra-axial collection or mass lesion/mass effect. Mild diffuse cerebral atrophy. Vascular: Moderate intracranial arterial vascular calcifications. Skull: Calvarium appears intact. No acute depressed skull fractures. Other: None. CT MAXILLOFACIAL FINDINGS Osseous: Frontal bones, orbital rims, nasal bones, maxillary antral walls, pterygoid plates, zygomatic arches, mandibles, and temporomandibular joints are intact. No acute displaced  fractures identified. No focal bone lesions. Orbits: Right periorbital hematoma. No retrobulbar extension. Globes and extraocular muscles appear intact and symmetrical. Sinuses: Paranasal sinuses and mastoid air cells are clear. Soft tissues: Soft tissue swelling and hematoma extending inferiorly from the right older region over the maxillary and mandibular region. Circumscribed hematoma in the soft tissues over the right  cheek measuring 2.6 cm diameter. IMPRESSION: 1. No acute intracranial abnormalities. 2. No acute displaced orbital or facial fractures identified. Right periorbital and facial soft tissue hematomas. Electronically Signed   By: Burman Nieves M.D.   On: 06/12/2018 03:14      Subjective: Feels good today. No concerns. No melena or hematochezia. No hemoptysis/hematemesis.   Discharge Exam: Vitals:   06/16/18 0211 06/16/18 0427  BP: (!) 150/84 124/61  Pulse: 81 89  Resp:    Temp: 97.9 F (36.6 C) 98.2 F (36.8 C)  SpO2: 95% 97%   Vitals:   06/16/18 0106 06/16/18 0148 06/16/18 0211 06/16/18 0427  BP: (!) 152/79 135/81 (!) 150/84 124/61  Pulse: 81 81 81 89  Resp:  16    Temp: (!) 97.4 F (36.3 C) 97.9 F (36.6 C) 97.9 F (36.6 C) 98.2 F (36.8 C)  TempSrc: Oral Oral Oral Oral  SpO2: 94% 92% 95% 97%  Weight:      Height:        General: Pt is alert, awake, not in acute distress Cardiovascular: RRR, S1/S2 +, no rubs, no gallops Respiratory: CTA bilaterally, no wheezing, no rhonchi Abdominal: Soft, NT, ND, bowel sounds + Extremities: no edema, no cyanosis    The results of significant diagnostics from this hospitalization (including imaging, microbiology, ancillary and laboratory) are listed below for reference.     Microbiology: No results found for this or any previous visit (from the past 240 hour(s)).   Labs: BNP (last 3 results) No results for input(s): BNP in the last 8760 hours. Basic Metabolic Panel: Recent Labs  Lab 06/15/18 1915 06/16/18 0848  NA 141 143  K 3.4* 3.3*  CL 109 109  CO2 22 22  GLUCOSE 101* 86  BUN 8 7*  CREATININE 0.94 0.90  CALCIUM 8.6* 8.6*   Liver Function Tests: No results for input(s): AST, ALT, ALKPHOS, BILITOT, PROT, ALBUMIN in the last 168 hours. No results for input(s): LIPASE, AMYLASE in the last 168 hours. No results for input(s): AMMONIA in the last 168 hours. CBC: Recent Labs  Lab 06/15/18 1915 06/16/18 0848  WBC  5.7 5.9  NEUTROABS 4.0  --   HGB 6.1* 8.5*  HCT 22.8* 30.0*  MCV 67.9* 71.8*  PLT 192 216   Cardiac Enzymes: No results for input(s): CKTOTAL, CKMB, CKMBINDEX, TROPONINI in the last 168 hours. BNP: Invalid input(s): POCBNP CBG: No results for input(s): GLUCAP in the last 168 hours. D-Dimer No results for input(s): DDIMER in the last 72 hours. Hgb A1c No results for input(s): HGBA1C in the last 72 hours. Lipid Profile No results for input(s): CHOL, HDL, LDLCALC, TRIG, CHOLHDL, LDLDIRECT in the last 72 hours. Thyroid function studies No results for input(s): TSH, T4TOTAL, T3FREE, THYROIDAB in the last 72 hours.  Invalid input(s): FREET3 Anemia work up Recent Labs    06/15/18 2208  VITAMINB12 675  FOLATE 8.4  FERRITIN 21  TIBC 468*  IRON 11*  RETICCTPCT 2.1    SIGNED:   Jacquelin Hawking, MD [facility] 06/16/2018, 9:45 AM

## 2018-06-16 NOTE — Progress Notes (Signed)
Pt given oral and written discharge instructions. IV pulled and pt tolerated well. Assisted to vehicle by volunteer staff.

## 2018-06-16 NOTE — Progress Notes (Signed)
    [name]  06/16/2018       Your procedure is scheduled on February 10.  Report to [facility] Admitting at 1:15 P.M.  Call this number if you have problems the morning of surgery:  [phone]   Remember:  Do not eat or drink after midnight.     Take these medicines the morning of surgery with A SIP OF WATER Xanax (if needed), Flexeril, Fluoxetine, Gabapentin, Hydrocodone, Omeprazole, Ranitidine, Phenergan (if needed)    Do not wear jewelry, make-up or nail polish.  Do not wear lotions, powders, or perfumes, or deodorant.  Do not shave 48 hours prior to surgery.  Men may shave face and neck.  Do not bring valuables to the hospital.  [facility] is not responsible for any belongings or valuables.  Contacts, dentures or bridgework may not be worn into surgery.  Leave your suitcase in the car.  After surgery it may be brought to your room.  For patients admitted to the hospital, discharge time will be determined by your treatment team.  Patients discharged the day of surgery will not be allowed to drive home.

## 2018-06-16 NOTE — Progress Notes (Signed)
Oral and written instructions given on pre-operative and discharge. Notified operative area of pt current hemoglobin d/t her concerns for surgery Monday. Informed to precede with pre-surgical instructions. Given CHG wipes and informed of proper use. Pt assisted to vehicle by volunteer staff.

## 2018-06-16 NOTE — Discharge Instructions (Signed)
[name],  You were in the hospital because of anemia (low blood count). You received 2 units of blood. This may be because of iron deficiency. I recommend you stop Mobic as this can increase the risk of bleeding in the GI system.      Monday June 18, 2018 Pre-operative instructions:   Your procedure is scheduled on February 10.            Report to [facility] Admitting at 1:15 P.M.            Call this number if you have problems the morning of surgery:            [phone]             Remember:            Do not eat or drink after midnight.                                   Take these medicines the morning of surgery with A SIP OF WATER Xanax (if needed), Flexeril, Fluoxetine, Gabapentin, Hydrocodone, Omeprazole, Ranitidine, Phenergan (if needed)                        Do not wear jewelry, make-up or nail polish.            Do not wear lotions, powders, or perfumes, or deodorant.            Do not shave 48 hours prior to surgery.  Men may shave face and neck.            Do not bring valuables to the hospital.            [facility] is not responsible for any belongings or valuables.  Contacts, dentures or bridgework may not be worn into surgery.  Leave your suitcase in the car.  After surgery it may be brought to your room.  For patients admitted to the hospital, discharge time will be determined by your treatment team.  Patients discharged the day of surgery will not be allowed to drive home.

## 2018-06-17 LAB — TYPE AND SCREEN
ABO/RH(D): O POS
Antibody Screen: NEGATIVE
Unit division: 0
Unit division: 0

## 2018-06-17 LAB — BPAM RBC
Blood Product Expiration Date: 202003102359
Blood Product Expiration Date: 202003102359
ISSUE DATE / TIME: 202002080135
ISSUE DATE / TIME: 202002072201
Unit Type and Rh: 5100
Unit Type and Rh: 5100

## 2018-06-18 ENCOUNTER — Encounter (HOSPITAL_COMMUNITY): Payer: Self-pay | Admitting: *Deleted

## 2018-06-18 ENCOUNTER — Ambulatory Visit (HOSPITAL_COMMUNITY)
Admission: RE | Admit: 2018-06-18 | Discharge: 2018-06-18 | Disposition: A | Discharge status: Home / Self Care | Payer: Medicare Other | Source: Ambulatory Visit | Attending: Orthopedic Surgery | Admitting: Orthopedic Surgery

## 2018-06-18 ENCOUNTER — Inpatient Hospital Stay (HOSPITAL_COMMUNITY): Payer: Medicare Other | Admitting: Anesthesiology

## 2018-06-18 ENCOUNTER — Encounter (HOSPITAL_COMMUNITY)
Admission: RE | Disposition: A | Discharge status: Home / Self Care | Payer: Self-pay | Source: Ambulatory Visit | Attending: Orthopedic Surgery

## 2018-06-18 DIAGNOSIS — Z801 Family history of malignant neoplasm of trachea, bronchus and lung: Secondary | ICD-10-CM | POA: Insufficient documentation

## 2018-06-18 DIAGNOSIS — Z8711 Personal history of peptic ulcer disease: Secondary | ICD-10-CM | POA: Diagnosis not present

## 2018-06-18 DIAGNOSIS — Z888 Allergy status to other drugs, medicaments and biological substances status: Secondary | ICD-10-CM | POA: Diagnosis not present

## 2018-06-18 DIAGNOSIS — E7800 Pure hypercholesterolemia, unspecified: Secondary | ICD-10-CM | POA: Diagnosis not present

## 2018-06-18 DIAGNOSIS — M797 Fibromyalgia: Secondary | ICD-10-CM | POA: Diagnosis not present

## 2018-06-18 DIAGNOSIS — I129 Hypertensive chronic kidney disease with stage 1 through stage 4 chronic kidney disease, or unspecified chronic kidney disease: Secondary | ICD-10-CM | POA: Insufficient documentation

## 2018-06-18 DIAGNOSIS — N183 Chronic kidney disease, stage 3 (moderate): Secondary | ICD-10-CM | POA: Diagnosis not present

## 2018-06-18 DIAGNOSIS — Z981 Arthrodesis status: Secondary | ICD-10-CM | POA: Insufficient documentation

## 2018-06-18 DIAGNOSIS — Z90710 Acquired absence of both cervix and uterus: Secondary | ICD-10-CM | POA: Insufficient documentation

## 2018-06-18 DIAGNOSIS — F329 Major depressive disorder, single episode, unspecified: Secondary | ICD-10-CM | POA: Diagnosis not present

## 2018-06-18 DIAGNOSIS — W06XXXA Fall from bed, initial encounter: Secondary | ICD-10-CM | POA: Diagnosis not present

## 2018-06-18 DIAGNOSIS — S52572A Other intraarticular fracture of lower end of left radius, initial encounter for closed fracture: Secondary | ICD-10-CM | POA: Insufficient documentation

## 2018-06-18 DIAGNOSIS — Z833 Family history of diabetes mellitus: Secondary | ICD-10-CM | POA: Diagnosis not present

## 2018-06-18 DIAGNOSIS — Z8249 Family history of ischemic heart disease and other diseases of the circulatory system: Secondary | ICD-10-CM | POA: Insufficient documentation

## 2018-06-18 DIAGNOSIS — Z9049 Acquired absence of other specified parts of digestive tract: Secondary | ICD-10-CM | POA: Diagnosis not present

## 2018-06-18 DIAGNOSIS — G894 Chronic pain syndrome: Secondary | ICD-10-CM | POA: Insufficient documentation

## 2018-06-18 DIAGNOSIS — K449 Diaphragmatic hernia without obstruction or gangrene: Secondary | ICD-10-CM | POA: Diagnosis not present

## 2018-06-18 DIAGNOSIS — E1122 Type 2 diabetes mellitus with diabetic chronic kidney disease: Secondary | ICD-10-CM | POA: Insufficient documentation

## 2018-06-18 DIAGNOSIS — Z825 Family history of asthma and other chronic lower respiratory diseases: Secondary | ICD-10-CM | POA: Diagnosis not present

## 2018-06-18 DIAGNOSIS — K219 Gastro-esophageal reflux disease without esophagitis: Secondary | ICD-10-CM | POA: Insufficient documentation

## 2018-06-18 DIAGNOSIS — S62102A Fracture of unspecified carpal bone, left wrist, initial encounter for closed fracture: Secondary | ICD-10-CM

## 2018-06-18 DIAGNOSIS — F419 Anxiety disorder, unspecified: Secondary | ICD-10-CM | POA: Insufficient documentation

## 2018-06-18 DIAGNOSIS — G43909 Migraine, unspecified, not intractable, without status migrainosus: Secondary | ICD-10-CM | POA: Insufficient documentation

## 2018-06-18 DIAGNOSIS — Z96653 Presence of artificial knee joint, bilateral: Secondary | ICD-10-CM | POA: Insufficient documentation

## 2018-06-18 DIAGNOSIS — D631 Anemia in chronic kidney disease: Secondary | ICD-10-CM | POA: Insufficient documentation

## 2018-06-18 DIAGNOSIS — S52502A Unspecified fracture of the lower end of left radius, initial encounter for closed fracture: Secondary | ICD-10-CM | POA: Diagnosis present

## 2018-06-18 HISTORY — PX: OPEN REDUCTION INTERNAL FIXATION (ORIF) DISTAL RADIAL FRACTURE: SHX5989

## 2018-06-18 LAB — CBC
HCT: 29.7 % — ABNORMAL LOW (ref 36.0–46.0)
Hemoglobin: 8.4 g/dL — ABNORMAL LOW (ref 12.0–15.0)
MCH: 20.6 pg — ABNORMAL LOW (ref 26.0–34.0)
MCHC: 28.3 g/dL — ABNORMAL LOW (ref 30.0–36.0)
MCV: 73 fL — ABNORMAL LOW (ref 80.0–100.0)
PLATELETS: 233 10*3/uL (ref 150–400)
RBC: 4.07 MIL/uL (ref 3.87–5.11)
RDW: 21.5 % — ABNORMAL HIGH (ref 11.5–15.5)
WBC: 4.3 10*3/uL (ref 4.0–10.5)
nRBC: 0 % (ref 0.0–0.2)

## 2018-06-18 SURGERY — OPEN REDUCTION INTERNAL FIXATION (ORIF) DISTAL RADIUS FRACTURE
Anesthesia: General | Laterality: Left

## 2018-06-18 MED ORDER — DEXAMETHASONE SODIUM PHOSPHATE 10 MG/ML IJ SOLN
INTRAMUSCULAR | Status: DC | PRN
  Administered 2018-06-18: 5 mg via INTRAVENOUS

## 2018-06-18 MED ORDER — LIDOCAINE HCL (CARDIAC) PF 100 MG/5ML IV SOSY
PREFILLED_SYRINGE | INTRAVENOUS | Status: DC | PRN
  Administered 2018-06-18: 100 mg via INTRATRACHEAL

## 2018-06-18 MED ORDER — LACTATED RINGERS IV SOLN
INTRAVENOUS | Status: DC
  Administered 2018-06-18: 14:00:00 via INTRAVENOUS

## 2018-06-18 MED ORDER — ONDANSETRON HCL 4 MG/2ML IJ SOLN
INTRAMUSCULAR | Status: AC
  Filled 2018-06-18: qty 2

## 2018-06-18 MED ORDER — 0.9 % SODIUM CHLORIDE (POUR BTL) OPTIME
TOPICAL | Status: DC | PRN
  Administered 2018-06-18: 1000 mL

## 2018-06-18 MED ORDER — FENTANYL CITRATE (PF) 100 MCG/2ML IJ SOLN
INTRAMUSCULAR | Status: AC
  Administered 2018-06-18: 50 ug via INTRAVENOUS
  Filled 2018-06-18: qty 2

## 2018-06-18 MED ORDER — FENTANYL CITRATE (PF) 100 MCG/2ML IJ SOLN
50.0000 ug | Freq: Once | INTRAMUSCULAR | Status: AC
  Administered 2018-06-18: 50 ug via INTRAVENOUS

## 2018-06-18 MED ORDER — PHENYLEPHRINE HCL 10 MG/ML IJ SOLN
INTRAMUSCULAR | Status: DC | PRN
  Administered 2018-06-18 (×2): 120 ug via INTRAVENOUS
  Administered 2018-06-18: 80 ug via INTRAVENOUS

## 2018-06-18 MED ORDER — ROPIVACAINE HCL 5 MG/ML IJ SOLN
INTRAMUSCULAR | Status: DC | PRN
  Administered 2018-06-18: 30 mL via PERINEURAL

## 2018-06-18 MED ORDER — DEXAMETHASONE SODIUM PHOSPHATE 10 MG/ML IJ SOLN
INTRAMUSCULAR | Status: AC
  Filled 2018-06-18: qty 1

## 2018-06-18 MED ORDER — FENTANYL CITRATE (PF) 250 MCG/5ML IJ SOLN
INTRAMUSCULAR | Status: DC | PRN
  Administered 2018-06-18 (×2): 12.5 ug via INTRAVENOUS

## 2018-06-18 MED ORDER — MIDAZOLAM HCL 2 MG/2ML IJ SOLN
INTRAMUSCULAR | Status: AC
  Administered 2018-06-18: 1 mg via INTRAVENOUS
  Filled 2018-06-18: qty 2

## 2018-06-18 MED ORDER — BUPIVACAINE HCL (PF) 0.25 % IJ SOLN
INTRAMUSCULAR | Status: AC
  Filled 2018-06-18: qty 30

## 2018-06-18 MED ORDER — EPHEDRINE SULFATE 50 MG/ML IJ SOLN
INTRAMUSCULAR | Status: DC | PRN
  Administered 2018-06-18: 10 mg via INTRAVENOUS

## 2018-06-18 MED ORDER — FENTANYL CITRATE (PF) 250 MCG/5ML IJ SOLN
INTRAMUSCULAR | Status: AC
  Filled 2018-06-18: qty 5

## 2018-06-18 MED ORDER — PROPOFOL 10 MG/ML IV BOLUS
INTRAVENOUS | Status: AC
  Filled 2018-06-18: qty 20

## 2018-06-18 MED ORDER — ACETAMINOPHEN 10 MG/ML IV SOLN: 1000.0000 mg | Freq: Once | INTRAVENOUS | Status: DC | PRN

## 2018-06-18 MED ORDER — CLONIDINE HCL 0.2 MG PO TABS
0.2000 mg | ORAL_TABLET | Freq: Once | ORAL | Status: AC
  Administered 2018-06-18: 0.2 mg via ORAL

## 2018-06-18 MED ORDER — CHLORHEXIDINE GLUCONATE 4 % EX LIQD: 60.0000 mL | Freq: Once | CUTANEOUS | Status: DC

## 2018-06-18 MED ORDER — ARTIFICIAL TEARS OPHTHALMIC OINT
TOPICAL_OINTMENT | OPHTHALMIC | Status: AC
  Filled 2018-06-18: qty 3.5

## 2018-06-18 MED ORDER — MIDAZOLAM HCL 2 MG/2ML IJ SOLN
INTRAMUSCULAR | Status: AC
  Filled 2018-06-18: qty 2

## 2018-06-18 MED ORDER — ONDANSETRON HCL 4 MG/2ML IJ SOLN
INTRAMUSCULAR | Status: DC | PRN
  Administered 2018-06-18: 4 mg via INTRAVENOUS

## 2018-06-18 MED ORDER — PHENYLEPHRINE 40 MCG/ML (10ML) SYRINGE FOR IV PUSH (FOR BLOOD PRESSURE SUPPORT)
PREFILLED_SYRINGE | INTRAVENOUS | Status: AC
  Filled 2018-06-18: qty 10

## 2018-06-18 MED ORDER — MIDAZOLAM HCL 2 MG/2ML IJ SOLN
1.0000 mg | Freq: Once | INTRAMUSCULAR | Status: AC
  Administered 2018-06-18: 1 mg via INTRAVENOUS

## 2018-06-18 MED ORDER — CLONIDINE HCL (ANALGESIA) 100 MCG/ML EP SOLN
EPIDURAL | Status: DC | PRN
  Administered 2018-06-18: 100 ug

## 2018-06-18 MED ORDER — MEPERIDINE HCL 50 MG/ML IJ SOLN: 6.2500 mg | INTRAMUSCULAR | Status: DC | PRN

## 2018-06-18 MED ORDER — EPHEDRINE 5 MG/ML INJ
INTRAVENOUS | Status: AC
  Filled 2018-06-18: qty 10

## 2018-06-18 MED ORDER — CLONIDINE HCL 0.2 MG PO TABS
ORAL_TABLET | ORAL | Status: AC
  Filled 2018-06-18: qty 1

## 2018-06-18 MED ORDER — CEFAZOLIN SODIUM-DEXTROSE 2-4 GM/100ML-% IV SOLN
2.0000 g | INTRAVENOUS | Status: AC
  Administered 2018-06-18: 2 g via INTRAVENOUS

## 2018-06-18 MED ORDER — HYDROMORPHONE HCL 1 MG/ML IJ SOLN: 0.2500 mg | INTRAMUSCULAR | Status: DC | PRN

## 2018-06-18 MED ORDER — CEFAZOLIN SODIUM-DEXTROSE 2-4 GM/100ML-% IV SOLN
INTRAVENOUS | Status: AC
  Filled 2018-06-18: qty 100

## 2018-06-18 MED ORDER — PROPOFOL 10 MG/ML IV BOLUS
INTRAVENOUS | Status: DC | PRN
  Administered 2018-06-18: 180 mg via INTRAVENOUS

## 2018-06-18 SURGICAL SUPPLY — 78 items
BANDAGE ACE 3X5.8 VEL STRL LF (GAUZE/BANDAGES/DRESSINGS) ×3 IMPLANT
BANDAGE ACE 4X5 VEL STRL LF (GAUZE/BANDAGES/DRESSINGS) ×3 IMPLANT
BIT DRILL 2.2 SS TIBIAL (BIT) ×2 IMPLANT
BLADE CLIPPER SURG (BLADE) IMPLANT
BNDG ADH 5X3 H2O RPLNT NS (GAUZE/BANDAGES/DRESSINGS) ×1
BNDG ADH 5X4 AIR PERM ELC (GAUZE/BANDAGES/DRESSINGS) ×1
BNDG CMPR 9X4 STRL LF SNTH (GAUZE/BANDAGES/DRESSINGS) ×1
BNDG COHESIVE 3X5 WHT NS (GAUZE/BANDAGES/DRESSINGS) ×2 IMPLANT
BNDG COHESIVE 4X5 WHT NS (GAUZE/BANDAGES/DRESSINGS) ×2 IMPLANT
BNDG ESMARK 4X9 LF (GAUZE/BANDAGES/DRESSINGS) ×3 IMPLANT
BNDG GAUZE ELAST 4 BULKY (GAUZE/BANDAGES/DRESSINGS) ×3 IMPLANT
CANISTER SUCT 3000ML PPV (MISCELLANEOUS) ×3 IMPLANT
CORD BI POLAR (MISCELLANEOUS) ×3 IMPLANT
COVER SURGICAL LIGHT HANDLE (MISCELLANEOUS) ×3 IMPLANT
COVER WAND RF STERILE (DRAPES) ×3 IMPLANT
CUFF TOURNIQUET SINGLE 18IN (TOURNIQUET CUFF) ×3 IMPLANT
CUFF TOURNIQUET SINGLE 24IN (TOURNIQUET CUFF) IMPLANT
DECANTER SPIKE VIAL GLASS SM (MISCELLANEOUS) ×1 IMPLANT
DRAPE OEC MINIVIEW 54X84 (DRAPES) ×3 IMPLANT
DRAPE SURG 17X11 SM STRL (DRAPES) ×3 IMPLANT
DRSG ADAPTIC 3X8 NADH LF (GAUZE/BANDAGES/DRESSINGS) ×3 IMPLANT
GAUZE 4X4 16PLY RFD (DISPOSABLE) ×3 IMPLANT
GAUZE SPONGE 4X4 12PLY STRL (GAUZE/BANDAGES/DRESSINGS) ×3 IMPLANT
GAUZE SPONGE 4X4 12PLY STRL LF (GAUZE/BANDAGES/DRESSINGS) ×2 IMPLANT
GAUZE XEROFORM 5X9 LF (GAUZE/BANDAGES/DRESSINGS) ×3 IMPLANT
GLOVE BIOGEL PI IND STRL 8.5 (GLOVE) ×1 IMPLANT
GLOVE BIOGEL PI INDICATOR 8.5 (GLOVE) ×2
GLOVE SURG ORTHO 8.0 STRL STRW (GLOVE) ×3 IMPLANT
GOWN STRL REUS W/ TWL LRG LVL3 (GOWN DISPOSABLE) ×1 IMPLANT
GOWN STRL REUS W/ TWL XL LVL3 (GOWN DISPOSABLE) ×1 IMPLANT
GOWN STRL REUS W/TWL LRG LVL3 (GOWN DISPOSABLE) ×3
GOWN STRL REUS W/TWL XL LVL3 (GOWN DISPOSABLE) ×3
K-WIRE 1.6 (WIRE) ×3
K-WIRE FX5X1.6XNS BN SS (WIRE) ×1
KIT BASIN OR (CUSTOM PROCEDURE TRAY) ×3 IMPLANT
KIT TURNOVER KIT B (KITS) ×3 IMPLANT
KWIRE FX5X1.6XNS BN SS (WIRE) IMPLANT
NDL HYPO 25X1 1.5 SAFETY (NEEDLE) ×1 IMPLANT
NEEDLE HYPO 25X1 1.5 SAFETY (NEEDLE) ×3 IMPLANT
NS IRRIG 1000ML POUR BTL (IV SOLUTION) ×3 IMPLANT
PACK ORTHO EXTREMITY (CUSTOM PROCEDURE TRAY) ×3 IMPLANT
PAD ARMBOARD 7.5X6 YLW CONV (MISCELLANEOUS) ×6 IMPLANT
PAD CAST 3X4 CTTN HI CHSV (CAST SUPPLIES) IMPLANT
PAD CAST 4YDX4 CTTN HI CHSV (CAST SUPPLIES) ×1 IMPLANT
PADDING CAST COTTON 3X4 STRL (CAST SUPPLIES) ×3
PADDING CAST COTTON 4X4 STRL (CAST SUPPLIES) ×3
PEG LOCKING SMOOTH 2.2X18 (Peg) ×2 IMPLANT
PEG LOCKING SMOOTH 2.2X20 (Screw) ×4 IMPLANT
PEG LOCKING SMOOTH 2.2X22 (Screw) ×6 IMPLANT
PLATE STD DVR LEFT (Plate) ×3 IMPLANT
PLATE STD DVR LT 24X55 (Plate) IMPLANT
SCREW LOCK 14X2.7X 3 LD TPR (Screw) IMPLANT
SCREW LOCK 20X2.7X 3 LD TPR (Screw) IMPLANT
SCREW LOCKING 2.7X13MM (Screw) ×4 IMPLANT
SCREW LOCKING 2.7X14 (Screw) ×9 IMPLANT
SCREW LOCKING 2.7X20MM (Screw) ×3 IMPLANT
SOAP 2 % CHG 4 OZ (WOUND CARE) ×3 IMPLANT
SPLINT FIBERGLASS 3X35 (CAST SUPPLIES) ×2 IMPLANT
SPLINT PLASTER EXTRA FAST 3X15 (CAST SUPPLIES) ×2
SPLINT PLASTER GYPS XFAST 3X15 (CAST SUPPLIES) IMPLANT
SPONGE LAP 4X18 RFD (DISPOSABLE) ×3 IMPLANT
SUT ETHILON 4 0 PS 2 18 (SUTURE) ×2 IMPLANT
SUT FIBERWIRE 2-0 18 17.9 3/8 (SUTURE) ×6
SUT PROLENE 4 0 PS 2 18 (SUTURE) ×2 IMPLANT
SUT VIC AB 2-0 CT1 27 (SUTURE)
SUT VIC AB 2-0 CT1 TAPERPNT 27 (SUTURE) IMPLANT
SUT VIC AB 2-0 SH 27 (SUTURE) ×3
SUT VIC AB 2-0 SH 27XBRD (SUTURE) IMPLANT
SUT VIC AB 4-0 PS2 18 (SUTURE) ×2 IMPLANT
SUT VICRYL 4-0 PS2 18IN ABS (SUTURE) IMPLANT
SUTURE FIBERWR 2-0 18 17.9 3/8 (SUTURE) IMPLANT
SYR CONTROL 10ML LL (SYRINGE) IMPLANT
TOWEL NATURAL 10PK STERILE (DISPOSABLE) ×3 IMPLANT
TOWEL NATURAL 6PK STERILE (DISPOSABLE) IMPLANT
TUBE CONNECTING 12'X1/4 (SUCTIONS) ×1
TUBE CONNECTING 12X1/4 (SUCTIONS) ×2 IMPLANT
WATER STERILE IRR 1000ML POUR (IV SOLUTION) ×1 IMPLANT
YANKAUER SUCT BULB TIP NO VENT (SUCTIONS) IMPLANT

## 2018-06-18 NOTE — Progress Notes (Signed)
Orthopedic Tech Progress Note Patient Details:  [name] 15-Jun-1955 [id number]  Ortho Devices Type of Ortho Device: Sling arm elevator Ortho Device/Splint Interventions: Ordered, Application, Adjustment   Post Interventions Patient Tolerated: Well Instructions Provided: Adjustment of device, Care of device   [name] J Zandrea Kenealy 06/18/2018, 7:50 PM

## 2018-06-18 NOTE — Anesthesia Procedure Notes (Signed)
Procedure Name: LMA Insertion Date/Time: 06/18/2018 4:22 PM Performed by: Modena Morrow, CRNA Pre-anesthesia Checklist: Patient identified, Emergency Drugs available, Suction available, Patient being monitored and Timeout performed Patient Re-evaluated:Patient Re-evaluated prior to induction Oxygen Delivery Method: Circle system utilized Preoxygenation: Pre-oxygenation with 100% oxygen Induction Type: IV induction Ventilation: Mask ventilation without difficulty LMA: LMA inserted LMA Size: 5.0 Number of attempts: 1 Placement Confirmation: breath sounds checked- equal and bilateral and positive ETCO2 Tube secured with: Tape Dental Injury: Teeth and Oropharynx as per pre-operative assessment

## 2018-06-18 NOTE — Anesthesia Procedure Notes (Signed)
Anesthesia Regional Block: Supraclavicular block   Pre-Anesthetic Checklist: ,, timeout performed, Correct Patient, Correct Site, Correct Laterality, Correct Procedure, Correct Position, site marked, Risks and benefits discussed,  Surgical consent,  Pre-op evaluation,  At surgeon's request and post-op pain management  Laterality: Left  Prep: chloraprep       Needles:  Injection technique: Single-shot  Needle Type: Echogenic Needle     Needle Length: 5cm  Needle Gauge: 21     Additional Needles:   Procedures:,,,, ultrasound used (permanent image in chart),,,,  Narrative:  Start time: 06/18/2018 2:22 PM End time: 06/18/2018 2:30 PM Injection made incrementally with aspirations every 5 mL.  Performed by: Personally  Anesthesiologist: Trevor Iha, MD

## 2018-06-18 NOTE — Discharge Instructions (Signed)
KEEP BANDAGE CLEAN AND DRY CALL OFFICE FOR F/U APPT [phone] IN 15 DAYS DR. Chaun Uemura SENT IN RX TO [facility] [place] KEEP HAND ELEVATED ABOVE HEART OK TO APPLY ICE TO OPERATIVE AREA CONTACT OFFICE IF ANY WORSENING PAIN OR CONCERNS.

## 2018-06-18 NOTE — Op Note (Signed)
PREOPERATIVE DIAGNOSIS: Left wrist intra-articular distal radius fracture 3 more fragments  POSTOPERATIVE DIAGNOSIS: Same  ATTENDING SURGEON: Dr. Bradly Bienenstock who is scrubbed and present for the entire procedure  ASSISTANT SURGEON: None  ANESTHESIA: General via LMA with regional block  OPERATIVE PROCEDURE: #1: Open treatment of left wrist intra-articular distal radius fracture 3 more fragments #2: Left wrist brachioradialis tendon release, tendon tenotomy #3: Radiographs 3 views left wrist  IMPLANTS: Biomet standard DVR volar rim cross lock  RADIOGRAPHIC INTERPRETATION: AP lateral oblique views of the wrist to show the volar plate fixation in place with good alignment of the radial height inclination and tilt  SURGICAL INDICATIONS: Patient is a right-hand-dominant female who sustained a closed injury to her left distal radius.  Patient was seen evaluate the office and recommended undergo the above procedure.  Risks of surgery include but not limited to bleeding infection damage nearby nerves arteries or tendons loss of motion of the wrist and digits incomplete relief of symptoms and need for further surgical intervention.  SURGICAL TECHNIQUE: Patient was palpated and found the preoperative holding area marked with a permanent marker made in the left wrist indicate the correct operative site.  Patient then brought back to operating placed supine on the anesthesia table where general anesthesia was administered.  Patient tolerated this well.  Preoperative antibiotics were given prior to any skin incision.  A well-padded tourniquet was then placed in the left brachium and stay with the appropriate drape.  The left upper extremities then prepped and draped normal sterile fashion.  A timeout was called the correct site was identified and the procedure then begun.  Attention was then turned to the left wrist.  A longitudinal incision made directly the flexor carpi radialis.  The limb was been elevated  tourniquet insufflated.  Dissection carried down through the skin and subcutaneous tissue.  The FCR was then opened proximally and distally.  Going through the floor the FCR sheath the FPL was swept out away and the pronator quadratus was then elevated and L-shaped fashion.  The brachioradialis was then carefully identified and tendon tenotomy was then done to reduce the radial column.  This exposed the intra-articular fracture of 3 or more fragments.  This was a highly comminuted very distal fracture.  Following this within the volar capsule two #2 FiberWire sutures were then placed in the capsule the sutures were then brought through the holes within the volar rim plate and the volar rim plate was then placed and held distally with a K wire.  Plate height was then adjusted with a K wire with the use of the mini C arm.  The oblong screw hole was then placed proximally.  Open reduction was then performed and then the plate was then fixed distally from an ulnar to radial direction with distal locking pegs final shaft screw fixation was then carried out.  The wound was then thoroughly irrigated.  Final radiographs were then obtained.  The pronator quadratus was then closed with 2-0 Vicryl suture.  The subcutaneous tissues were closed with 4-0 Vicryl.  The volar capsule stitches were sutures were then tied down around the plate prior to closure of the pronator quadratus.  The skin was then closed using simple Prolene suture.  Adaptic dressing and sterile compressive bandage then applied.  The patient was then placed in a well-padded sugar tong splint extubated taken recovery in good condition.  POSTOPERATIVE PLAN: Patient be discharged to home.  See him back in the office in 2  weeks for wound check suture removal application of a short arm cast placed the therapy order begin therapy regimen at the 4-week mark.  Radiographs at each visit.

## 2018-06-18 NOTE — Transfer of Care (Signed)
Immediate Anesthesia Transfer of Care Note  Patient: [name]  Procedure(s) Performed: OPEN REDUCTION INTERNAL FIXATION (ORIF) DISTAL RADIAL FRACTURE repair and/or reconstruction (Left )  Patient Location: PACU  Anesthesia Type:MAC  Level of Consciousness: drowsy and patient cooperative  Airway & Oxygen Therapy: Patient Spontanous Breathing and Patient connected to face mask oxygen  Post-op Assessment: Report given to RN and Post -op Vital signs reviewed and stable  Post vital signs: Reviewed and stable  Last Vitals:  Vitals Value Taken Time  BP 106/63 06/18/2018  5:23 PM  Temp    Pulse 78 06/18/2018  5:29 PM  Resp 18 06/18/2018  5:29 PM  SpO2 92 % 06/18/2018  5:29 PM  Vitals shown include unvalidated device data.  Last Pain:  Vitals:   06/18/18 1540  TempSrc:   PainSc: 3       Patients Stated Pain Goal: 4 (80/[birth year]/49 1791)  Complications: No apparent anesthesia complications

## 2018-06-18 NOTE — Anesthesia Postprocedure Evaluation (Signed)
Anesthesia Post Note  Patient: [name]  Procedure(s) Performed: OPEN REDUCTION INTERNAL FIXATION (ORIF) DISTAL RADIAL FRACTURE repair and/or reconstruction (Left )     Patient location during evaluation: PACU Anesthesia Type: General Level of consciousness: awake and alert Pain management: pain level controlled Vital Signs Assessment: post-procedure vital signs reviewed and stable Respiratory status: spontaneous breathing, nonlabored ventilation and respiratory function stable Cardiovascular status: blood pressure returned to baseline and stable Postop Assessment: no apparent nausea or vomiting Anesthetic complications: no    Last Vitals:  Vitals:   06/18/18 1949 06/18/18 1954  BP: 126/71   Pulse: 70   Resp: 16   Temp:  36.5 C  SpO2: 90%     Last Pain:  Vitals:   06/18/18 1954  TempSrc:   PainSc: 0-No pain                 Kaylyn Layer

## 2018-06-18 NOTE — Anesthesia Preprocedure Evaluation (Signed)
Anesthesia Evaluation  Patient identified by MRN, date of birth, ID band Patient awake    Reviewed: Allergy & Precautions, NPO status , Patient's Chart, lab work & pertinent test results  Airway Mallampati: II  TM Distance: >3 FB Neck ROM: Full    Dental no notable dental hx. (+) Edentulous Upper, Edentulous Lower   Pulmonary    Pulmonary exam normal breath sounds clear to auscultation       Cardiovascular hypertension, Pt. on medications + CAD  Normal cardiovascular exam Rhythm:Regular Rate:Normal     Neuro/Psych  Headaches, PSYCHIATRIC DISORDERS Anxiety    GI/Hepatic hiatal hernia,   Endo/Other  negative endocrine ROS  Renal/GU      Musculoskeletal  (+) Arthritis , Fibromyalgia -Chronic pain Peripheral neuropathy Hands and Feet   Abdominal   Peds  Hematology  (+) anemia , Hgb 8.4   Anesthesia Other Findings   Reproductive/Obstetrics                            Anesthesia Physical Anesthesia Plan  ASA: III  Anesthesia Plan: General   Post-op Pain Management:  Regional for Post-op pain   Induction: Intravenous  PONV Risk Score and Plan: 3 and Treatment may vary due to age or medical condition, Ondansetron and Dexamethasone  Airway Management Planned: LMA  Additional Equipment:   Intra-op Plan:   Post-operative Plan:   Informed Consent: I have reviewed the patients History and Physical, chart, labs and discussed the procedure including the risks, benefits and alternatives for the proposed anesthesia with the patient or authorized representative who has indicated his/her understanding and acceptance.     Dental advisory given  Plan Discussed with: CRNA  Anesthesia Plan Comments: (ORIF L distal radius w supraclavicular)        Anesthesia Quick Evaluation

## 2018-06-19 ENCOUNTER — Encounter (HOSPITAL_COMMUNITY): Payer: Self-pay | Admitting: Orthopedic Surgery

## 2018-07-10 ENCOUNTER — Other Ambulatory Visit: Payer: Self-pay | Admitting: Interventional Cardiology

## 2018-07-11 ENCOUNTER — Telehealth: Payer: Self-pay | Admitting: Hematology

## 2018-07-11 NOTE — Telephone Encounter (Signed)
A new hem appt has been scheduled for the pt to see Dr. Candise Che on 3/24 at 1pm. Pt aware to arrive 30 minutes early.

## 2018-07-16 ENCOUNTER — Ambulatory Visit
Admission: RE | Admit: 2018-07-16 | Discharge: 2018-07-16 | Disposition: A | Discharge status: Home / Self Care | Payer: Medicare Other | Source: Ambulatory Visit | Attending: Diagnostic Neuroimaging | Admitting: Diagnostic Neuroimaging

## 2018-07-16 DIAGNOSIS — G890 Central pain syndrome: Secondary | ICD-10-CM

## 2018-07-16 MED ORDER — GADOBENATE DIMEGLUMINE 529 MG/ML IV SOLN
20.0000 mL | Freq: Once | INTRAVENOUS | Status: AC | PRN
  Administered 2018-07-16: 20 mL via INTRAVENOUS

## 2018-07-17 ENCOUNTER — Telehealth: Payer: Self-pay | Admitting: *Deleted

## 2018-07-17 NOTE — Telephone Encounter (Signed)
Spoke to pt and relayed that her MRI brain results per Dr. Marjory Lies were unremarkable, (normal age appropriate MRI).  No acute findings.  She verbalized understanding.

## 2018-07-17 NOTE — Telephone Encounter (Signed)
-----   Message from Suanne Marker, MD sent at 07/17/2018 12:00 PM EDT ----- Unremarkable imaging results. Please call patient. Continue current plan. -VRP

## 2018-07-27 ENCOUNTER — Encounter: Payer: Medicare Other | Admitting: Internal Medicine

## 2018-07-30 ENCOUNTER — Telehealth: Payer: Self-pay | Admitting: Hematology

## 2018-07-30 NOTE — Telephone Encounter (Signed)
Pt cld to reschedule appt with Dr. Candise Che to 5/13 at 1pm.

## 2018-07-31 ENCOUNTER — Encounter: Payer: Medicare Other | Admitting: Hematology

## 2018-08-15 ENCOUNTER — Other Ambulatory Visit: Payer: Self-pay | Admitting: Interventional Cardiology

## 2018-09-17 ENCOUNTER — Telehealth: Payer: Self-pay | Admitting: Hematology

## 2018-09-17 NOTE — Telephone Encounter (Signed)
Pt cld to reschedule appt w/Dr. Candise Che to 6/30 at 1pm.

## 2018-09-18 ENCOUNTER — Other Ambulatory Visit: Payer: Self-pay | Admitting: Interventional Cardiology

## 2018-09-19 ENCOUNTER — Other Ambulatory Visit: Payer: Medicare Other

## 2018-09-19 ENCOUNTER — Encounter: Payer: Medicare Other | Admitting: Hematology

## 2018-10-02 ENCOUNTER — Other Ambulatory Visit: Payer: Self-pay | Admitting: Interventional Cardiology

## 2018-11-06 ENCOUNTER — Inpatient Hospital Stay: Payer: Medicare Other

## 2018-11-06 ENCOUNTER — Other Ambulatory Visit: Payer: Self-pay

## 2018-11-06 ENCOUNTER — Telehealth: Payer: Self-pay | Admitting: *Deleted

## 2018-11-06 ENCOUNTER — Inpatient Hospital Stay: Payer: Medicare Other | Attending: Internal Medicine | Admitting: Hematology

## 2018-11-06 VITALS — BP 138/76 | HR 74 | Temp 98.5°F | Resp 18 | Ht 63.0 in | Wt 203.1 lb

## 2018-11-06 DIAGNOSIS — E876 Hypokalemia: Secondary | ICD-10-CM

## 2018-11-06 DIAGNOSIS — D509 Iron deficiency anemia, unspecified: Secondary | ICD-10-CM

## 2018-11-06 DIAGNOSIS — Z79899 Other long term (current) drug therapy: Secondary | ICD-10-CM

## 2018-11-06 LAB — CMP (CANCER CENTER ONLY)
ALT: 7 U/L (ref 0–44)
AST: 15 U/L (ref 15–41)
Albumin: 3.8 g/dL (ref 3.5–5.0)
Alkaline Phosphatase: 138 U/L — ABNORMAL HIGH (ref 38–126)
Anion gap: 13 (ref 5–15)
BUN: 14 mg/dL (ref 8–23)
CO2: 25 mmol/L (ref 22–32)
Calcium: 8.8 mg/dL — ABNORMAL LOW (ref 8.9–10.3)
Chloride: 107 mmol/L (ref 98–111)
Creatinine: 1.44 mg/dL — ABNORMAL HIGH (ref 0.44–1.00)
GFR, Est AFR Am: 44 mL/min — ABNORMAL LOW (ref >60)
GFR, Estimated: 38 mL/min — ABNORMAL LOW (ref >60)
Glucose, Bld: 90 mg/dL (ref 70–99)
Potassium: 2.9 mmol/L — CL (ref 3.5–5.1)
Sodium: 145 mmol/L (ref 135–145)
Total Bilirubin: 0.2 mg/dL — ABNORMAL LOW (ref 0.3–1.2)
Total Protein: 7.4 g/dL (ref 6.5–8.1)

## 2018-11-06 LAB — SEDIMENTATION RATE: Sed Rate: 19 mm/hr (ref 0–22)

## 2018-11-06 LAB — CBC WITH DIFFERENTIAL/PLATELET
Abs Immature Granulocytes: 0.02 10*3/uL (ref 0.00–0.07)
Basophils Absolute: 0.1 10*3/uL (ref 0.0–0.1)
Basophils Relative: 1 %
Eosinophils Absolute: 0.3 10*3/uL (ref 0.0–0.5)
Eosinophils Relative: 7 %
HCT: 33.6 % — ABNORMAL LOW (ref 36.0–46.0)
Hemoglobin: 9.6 g/dL — ABNORMAL LOW (ref 12.0–15.0)
Immature Granulocytes: 0 %
Lymphocytes Relative: 29 %
Lymphs Abs: 1.3 10*3/uL (ref 0.7–4.0)
MCH: 21 pg — ABNORMAL LOW (ref 26.0–34.0)
MCHC: 28.6 g/dL — ABNORMAL LOW (ref 30.0–36.0)
MCV: 73.4 fL — ABNORMAL LOW (ref 80.0–100.0)
Monocytes Absolute: 0.4 10*3/uL (ref 0.1–1.0)
Monocytes Relative: 8 %
Neutro Abs: 2.4 10*3/uL (ref 1.7–7.7)
Neutrophils Relative %: 55 %
Platelets: 233 10*3/uL (ref 150–400)
RBC: 4.58 MIL/uL (ref 3.87–5.11)
RDW: 22.1 % — ABNORMAL HIGH (ref 11.5–15.5)
WBC: 4.5 10*3/uL (ref 4.0–10.5)
nRBC: 0 % (ref 0.0–0.2)

## 2018-11-06 LAB — FERRITIN: Ferritin: 53 ng/mL (ref 11–307)

## 2018-11-06 LAB — IRON AND TIBC
Iron: 153 ug/dL — ABNORMAL HIGH (ref 41–142)
Saturation Ratios: 33 % (ref 21–57)
TIBC: 464 ug/dL — ABNORMAL HIGH (ref 236–444)
UIBC: 311 ug/dL (ref 120–384)

## 2018-11-06 LAB — RETICULOCYTES
Immature Retic Fract: 23.1 % — ABNORMAL HIGH (ref 2.3–15.9)
RBC.: 4.4 MIL/uL (ref 3.87–5.11)
Retic Count, Absolute: 138.2 10*3/uL (ref 19.0–186.0)
Retic Ct Pct: 3.1 % (ref 0.4–3.1)

## 2018-11-06 LAB — VITAMIN B12: Vitamin B-12: 458 pg/mL (ref 180–914)

## 2018-11-06 MED ORDER — POTASSIUM CHLORIDE CRYS ER 20 MEQ PO TBCR: 20.0000 meq | EXTENDED_RELEASE_TABLET | Freq: Two times a day (BID) | ORAL | 0 refills | Status: DC

## 2018-11-06 NOTE — Telephone Encounter (Signed)
Contacted patient. regarding test results per Dr. Grier Mitts directions: Dr. Irene Limbo sent a potassium prescription to her pharmacy and he wants her to f/u with her PCP for lab recheck in 7-10 days. Contacted patient with this information. Patient states she is taking potassium (not in med list at this time) prescribed by Dr. Lysle Rubens (with Sadie Haber MD) 10 meq twice a day. Advised her Dr.Kale will be given information and will call her back. Per Dr. Irene Limbo - Direct patient to take Potassium 40 mEq once. Follow with Potassium 20 mEq twice a day. Contact Dr. Lysle Rubens to have blood test in 7-10 days.  Attempted to contact patient with instructions from Dr. Irene Limbo. No answer. Left instructions on voice mail and asked patient to call office for follow up or questions.

## 2018-11-06 NOTE — Progress Notes (Signed)
HEMATOLOGY/ONCOLOGY CONSULTATION NOTE  Date of Service: 11/06/2018  Patient Care Team: Georgann HousekeeperHusain, Karrar, MD as PCP - General (Internal Medicine) Lyn RecordsSmith, Henry W, MD as PCP - Cardiology (Cardiology)  CHIEF COMPLAINTS/PURPOSE OF CONSULTATION:  Anemia  HISTORY OF PRESENTING ILLNESS:   [name] is a wonderful 63 y.o. female who has been referred to us by Dr. Georgann HousekeeperKarrar Husain for evaluation and management of Anemia. The pt reports that she is doing well overall.   The pt reports that she has had "anemia off and on for the last 3-4 years, but last year was the worst ever." She started PO [place] slow release iron replacement for the last month, in addition to potassium replacement. She takes 3 pills of her PO iron per day (total of 135mg  elemental iron). Denies GI intolerance with iron replacement. She denies ice cravings or picca symptoms. She has not had IV iron in the past.  She has taken Omeprazole for 2-3 years and endorses "real bad acid reflux." She had a bleeding ulcer in 2018. She denies ulcers or concerns for GI bleeding recently, and denies blood in the stools or black stools. She notes that she recently had a colonoscopy and endoscopy in the last couple months with [facility] and notes that this was unremarkable. The pt notes that she has had two blood transfusions, one in 2018 with stomach ulcer bleed and second prior to a February 2020 surgery.  The pt had surgery on her left wrist in February 2020 after falling out of her bed and fracturing her bed. She will be having her bed lowered soon. She notes that she also fell a few weeks ago when she lost her balance, and sustained a cut on her forehead requiring stitches. She notes that she has poor balance and endorses peripheral neuropathy. She also endorses fibro myalgia.   The pt notes that she has migraines and uses Excedrin a few times each month.   She notes that she had less fatigue after her blood transfusions, but has  recently been feeling more tired. She denies dietary restrictions. She notes that she has a hard time swallowing occasionally, and has had her esophagus dilated once before. She notes that she doesn't have much of an appetite. She denies unexpected weight loss. She endorses staying very well hydrated with frequent water consumption.  Most recent lab results (06/27/18) of CBC is as follows: all values are WNL except for HGB at 9.6, HCT at 31.1, MCV at 67.9, MCH at 21.0, MCHC at 30.9, RDW at 25.3.  On review of systems, pt reports feeling tired, low appetite, acid reflux, poor balance, and denies picca symptoms, unexpected weight loss, noticing new lumps or bumps, new pain along the spine, abdominal pains, leg swelling, and any other symptoms.   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 On Social Hx the pt denies alcohol consumption or ever smoking cigarettes. On Family Hx the pt denies blood disorders   MEDICAL HISTORY:  Past Medical History:  Diagnosis Date  . Anemia   . Anxiety   . Arthritis    "all over" (11/16/2016)  . Chronic lower back pain   . Chronic pain syndrome   . Chronic pain syndrome    "all over" (11/16/2016)  . CKD (chronic kidney disease), stage III (HCC)   . Daily headache   . Depression   . Fibromyalgia   . GERD (gastroesophageal reflux disease)   . High cholesterol   . History of  blood transfusion    "related to hand laceration"  . History of hiatal hernia   . History of stomach ulcers    "bled when I was younger" (11/16/2016)  . Hypertension    hx; "they took me off my RX" (11/16/2016)  . Migraine 1970s  . Phlebitis of leg, right    "years ago" (11/16/2016)    SURGICAL HISTORY: Past Surgical History:  Procedure Laterality Date  . ABDOMINAL HYSTERECTOMY  1993   w/left ovary  . ANKLE FRACTURE SURGERY Bilateral   . APPENDECTOMY  1977  . BACK SURGERY    . CARDIAC CATHETERIZATION  2005   "negative"  . CHOLECYSTECTOMY OPEN  1977  .  ESOPHAGOGASTRODUODENOSCOPY  2008   dx'd GERD  . FOREARM FRACTURE SURGERY Right   . HIP SURGERY Bilateral    "took out bone spurs"  . JOINT REPLACEMENT    . KNEE ARTHROSCOPY Bilateral    "total of 18 times"  . LACERATION REPAIR Right    hand; "went thru glass door"  . LEFT HEART CATH AND CORONARY ANGIOGRAPHY N/A 11/18/2016   Procedure: Left Heart Cath and Coronary Angiography;  Surgeon: Nelva Bush, MD;  Location: [facility];  Service: Cardiovascular;  Laterality: N/A;  . LUMBAR FUSION     "put rods in"  . LUMBAR LAMINECTOMY/DECOMPRESSION MICRODISCECTOMY  05/2006  . OPEN REDUCTION INTERNAL FIXATION (ORIF) DISTAL RADIAL FRACTURE Left 06/18/2018   Procedure: OPEN REDUCTION INTERNAL FIXATION (ORIF) DISTAL RADIAL FRACTURE repair and/or reconstruction;  Surgeon: Iran Planas, MD;  Location: [facility];  Service: Orthopedics;  Laterality: Left;  . REVISION TOTAL KNEE ARTHROPLASTY Bilateral   . RIGHT OOPHORECTOMY Right   . SHOULDER OPEN ROTATOR CUFF REPAIR Bilateral    left 04/2006  . TONSILLECTOMY    . TOTAL KNEE ARTHROPLASTY Bilateral    left 1998; right 2004  . TUBAL LIGATION      SOCIAL HISTORY: Social History   Socioeconomic History  . Marital status: Widowed    Spouse name: Not on file  . Number of children: Not on file  . Years of education: Not on file  . Highest education level: Not on file  Occupational History  . Not on file  Social Needs  . Financial resource strain: Not on file  . Food insecurity    Worry: Not on file    Inability: Not on file  . Transportation needs    Medical: Not on file    Non-medical: Not on file  Tobacco Use  . Smoking status: Never Smoker  . Smokeless tobacco: Never Used  Substance and Sexual Activity  . Alcohol use: No  . Drug use: No  . Sexual activity: Yes  Lifestyle  . Physical activity    Days per week: Not on file    Minutes per session: Not on file  . Stress: Not on file  Relationships  . Social Product manager on phone: Not on file    Gets together: Not on file    Attends religious service: Not on file    Active member of club or organization: Not on file    Attends meetings of clubs or organizations: Not on file    Relationship status: Not on file  . Intimate partner violence    Fear of current or ex partner: Not on file    Emotionally abused: Not on file    Physically abused: Not on file    Forced sexual activity: Not on file  Other Topics  Concern  . Not on file  Social History Narrative   Lives with Myrla HalstedBruce Younts.  Education: college.  Children none.  Retired and Disabled.  Caffeine Drinks tea.    FAMILY HISTORY: Family History  Problem Relation Age of Onset  . CAD Father 4340  . Lung cancer Father   . COPD Mother   . Emphysema Mother   . Other Sister        TRAP syndrome  . Diabetes Sister        boarderline  . Iron deficiency Sister     ALLERGIES:  is allergic to aspirin and oxybutynin.  MEDICATIONS:  Current Outpatient Medications  Medication Sig Dispense Refill  . Acetaminophen-Caffeine (EXCEDRIN ASPIRIN FREE PO) Take 2 tablets by mouth 3 (three) times daily as needed (pain).     Marland Kitchen. ALPRAZolam (XANAX) 1 MG tablet Take 0.5 mg by mouth 3 (three) times daily as needed for anxiety.     . Alum & Mag Hydroxide-Simeth (MAALOX ADVANCED PO) Take 15 mLs by mouth 3 (three) times daily as needed (acid reflux).     . cyclobenzaprine (FLEXERIL) 10 MG tablet Take 10 mg by mouth 2 (two) times daily as needed for muscle spasms.    . ferrous gluconate (FERGON) 324 MG tablet Take 1 tablet (324 mg total) by mouth daily with breakfast.    . FLUoxetine (PROZAC) 20 MG capsule Take 60 mg by mouth daily.     . furosemide (LASIX) 20 MG tablet Take 10-20 mg by mouth daily as needed for fluid or edema.     . gabapentin (NEURONTIN) 100 MG capsule Take 200 mg by mouth 3 (three) times daily.     Marland Kitchen. HYDROcodone-acetaminophen (NORCO/VICODIN) 5-325 MG tablet Take 1 tablet by mouth 3 (three) times daily.      Marland Kitchen. lisinopril (PRINIVIL,ZESTRIL) 5 MG tablet Take 5 mg by mouth daily.    . nitroGLYCERIN (NITROSTAT) 0.4 MG SL tablet Place 1 tablet (0.4 mg total) under the tongue every 5 (five) minutes x 3 doses as needed for chest pain. 25 tablet 2  . omeprazole (PRILOSEC) 20 MG capsule Take 20 mg by mouth 2 (two) times daily.    . promethazine (PHENERGAN) 25 MG tablet Take 12.5 mg by mouth every 8 (eight) hours as needed for nausea or vomiting.     . ranitidine (ZANTAC) 150 MG tablet Take 150 mg by mouth daily.     . rosuvastatin (CRESTOR) 20 MG tablet TAKE 1 TABLET BY MOUTH ONCE DAILY . APPOINTMENT REQUIRED FOR FUTURE REFILLS 30 tablet 0   No current facility-administered medications for this visit.     REVIEW OF SYSTEMS:    10 Point review of Systems was done is negative except as noted above.  PHYSICAL EXAMINATION:  Vitals:   11/06/18 1302  BP: 138/76  Pulse: 74  Resp: 18  Temp: 98.5 F (36.9 C)  SpO2: 98%   Filed Weights   11/06/18 1302  Weight: 203 lb 1.6 oz (92.1 kg)   Body mass index is 35.98 kg/m.  GENERAL:alert, in no acute distress and comfortable SKIN: no acute rashes, no significant lesions EYES: conjunctiva are pink and non-injected, sclera anicteric OROPHARYNX: MMM, no exudates, no oropharyngeal erythema or ulceration NECK: supple, no JVD LYMPH:  no palpable lymphadenopathy in the cervical, axillary or inguinal regions LUNGS: clear to auscultation b/l with normal respiratory effort HEART: regular rate & rhythm ABDOMEN:  normoactive bowel sounds , non tender, not distended. No palpable hepatosplenomegaly. Extremity: no pedal edema PSYCH:  alert & oriented x 3 with fluent speech NEURO: no focal motor/sensory deficits  LABORATORY DATA:  I have reviewed the data as listed  . CBC Latest Ref Rng & Units 11/06/2018 11/06/2018 06/18/2018  WBC 4.0 - 10.5 K/uL 4.5 - 4.3  Hemoglobin 12.0 - 15.0 g/dL 1.6(X9.6(L) - 8.4(L)  Hematocrit 34.0 - 46.6 % 33.6(L) 32.3(L) 29.7(L)   Platelets 150 - 400 K/uL 233 - 233    . CMP Latest Ref Rng & Units 11/06/2018 06/16/2018 06/15/2018  Glucose 70 - 99 mg/dL 90 86 096(E101(H)  BUN 8 - 23 mg/dL 14 7(L) 8  Creatinine 4.540.44 - 1.00 mg/dL 0.98(J1.44(H) 1.910.90 4.780.94  Sodium 135 - 145 mmol/L 145 143 141  Potassium 3.5 - 5.1 mmol/L 2.9(LL) 3.3(L) 3.4(L)  Chloride 98 - 111 mmol/L 107 109 109  CO2 22 - 32 mmol/L 25 22 22   Calcium 8.9 - 10.3 mg/dL 2.9(F8.8(L) 6.2(Z8.6(L) 3.0(Q8.6(L)  Total Protein 6.5 - 8.1 g/dL 7.4 - -  Total Bilirubin 0.3 - 1.2 mg/dL 6.5(H0.2(L) - -  Alkaline Phos 38 - 126 U/L 138(H) - -  AST 15 - 41 U/L 15 - -  ALT 0 - 44 U/L 7 - -    06/27/18 CBC:     RADIOGRAPHIC STUDIES: I have personally reviewed the radiological images as listed and agreed with the findings in the report. No results found.  ASSESSMENT & PLAN:  63 y.o. female with  1. Iron Deficiency Anemia -Discussed patient's most recent labs from 06/27/18, HGB at 9.6 with MCV at 67.9 and RDW at 25.3. PLT and WBC normal. -06/15/18 Ferritin at 21 -Reviewed pt's labs over the last two years, which show anemia over that time. Pt has also required two blood transfusions in the last two years. -Discussed that the pt appears to be iron deficient, and may also have an element of nutritional deficiencies as well, which she would be at risk for given her daily use of Omeprazole. -Will order labs today and evaluate for iron deficiency -Will see the pt back based on labs.   All of the patients questions were answered with apparent satisfaction. The patient knows to call the clinic with any problems, questions or concerns.  The total time spent in the appt was 35 minutes and more than 50% was on counseling and direct patient cares.    Wyvonnia LoraGautam Kalynne Womac MD MS AAHIVMS [facility] Hematology/Oncology Physician [facility]  (Office):       [phone] (Work cell):  [phone] (Fax):           [phone]  11/06/2018 1:54 PM  I, Marcelline MatesSchuyler Bain, am acting as a scribe for Dr. Wyvonnia LoraGautam  Keshauna Degraffenreid.   .I have reviewed the above documentation for accuracy and completeness, and I agree with the above. .You received the medication sugammadex ([place]) while in the operating room. This medication can interact with hormonal forms of birth control.  Johney Maine.Olyn Landstrom Kishore Myan Suit MD   ADDENDUM   . CBC    Component Value Date/Time   WBC 4.5 11/06/2018 1359   RBC 4.58 11/06/2018 1359   RBC 4.40 11/06/2018 1359   HGB 9.6 (L) 11/06/2018 1359   HCT 33.6 (L) 11/06/2018 1359   HCT 32.3 (L) 11/06/2018 1359   PLT 233 11/06/2018 1359   MCV 73.4 (L) 11/06/2018 1359   MCH 21.0 (L) 11/06/2018 1359   MCHC 28.6 (L) 11/06/2018 1359   RDW 22.1 (H) 11/06/2018 1359   LYMPHSABS 1.3 11/06/2018 1359   MONOABS 0.4 11/06/2018 1359  EOSABS 0.3 11/06/2018 1359   BASOSABS 0.1 11/06/2018 1359    . CMP Latest Ref Rng & Units 11/06/2018 06/16/2018 06/15/2018  Glucose 70 - 99 mg/dL 90 86 829(F)  BUN 8 - 23 mg/dL 14 7(L) 8  Creatinine 6.21 - 1.00 mg/dL 3.08(M) 5.78 4.69  Sodium 135 - 145 mmol/L 145 143 141  Potassium 3.5 - 5.1 mmol/L 2.9(LL) 3.3(L) 3.4(L)  Chloride 98 - 111 mmol/L 107 109 109  CO2 22 - 32 mmol/L Calcium 8.9 - 10.3 mg/dL 6.2(X) 5.2(W) 4.1(L)  Total Protein 6.5 - 8.1 g/dL 7.4 - -  Total Bilirubin 0.3 - 1.2 mg/dL 2.4(M) - -  Alkaline Phos 38 - 126 U/L 138(H) - -  AST 15 - 41 U/L 15 - -  ALT 0 - 44 U/L 7 - -   . Lab Results  Component Value Date   IRON 153 (H) 11/06/2018   TIBC 464 (H) 11/06/2018   IRONPCTSAT 33 11/06/2018   (Iron and TIBC)  Lab Results  Component Value Date   FERRITIN 53 11/06/2018   Component     Latest Ref Rng & Units 11/06/2018  Retic Ct Pct     0.4 - 3.1 % 3.1  RBC.     3.87 - 5.11 MIL/uL 4.40  Retic Count, Absolute     19.0 - 186.0 K/uL 138.2  Immature Retic Fract     2.3 - 15.9 % 23.1 (H)  Folate, Hemolysate     Not Estab. ng/mL 371.0  HCT     34.0 - 46.6 % 32.3 (L)  Folate, RBC     >498 ng/mL 1,149  Vitamin B12     180 - 914 pg/mL  458  Sed Rate     0 - 22 mm/hr 19   PLAN -hypokalemia -- potassium replacement ordered- patient has potassium at home -iron polysaccharide 150 mg po daily OTC + B complex 1 capsule podaily -RTC with Dr Candise Che with labs in 3 months.  Johney Maine MD

## 2018-11-06 NOTE — Patient Instructions (Signed)
Thank you for choosing [facility] to provide your oncology and hematology care.   Should you have questions after your visit to the [facility] ([facility]), please contact this office at [phone] between 8:30 AM and 4:30 PM. Voicemails left after 4:00 PM may not be returned until the following business day. Calls received after 4:30 PM will be answered by an off-site Nurse Triage Line.    Prescription Refills:  Please have your pharmacy contact us directly for most prescription requests.  Contact the office directly for refills of narcotics (pain medications). Allow 48-72 hours for refills.  Appointments: Please contact the [facility] scheduling department [phone] for questions regarding [facility] appointment scheduling.  Contact the schedulers with any scheduling changes so that your appointment can be rescheduled in a timely manner.   Central Scheduling for [place] [phone] - Call to schedule procedures such as PET scans, CT scans, MRI, Ultrasound, etc.  To afford each patient quality time with our providers, please arrive 30 minutes before your scheduled appointment time.  If you arrive late for your appointment, you may be asked to reschedule.  We strive to give you quality time with our providers, and arriving late affects you and other patients whose appointments are after yours. If you are a no show for multiple scheduled visits, you may be dismissed from the clinic at the providers discretion.     Resources: [facility] Social Workers [phone] for additional information on assistance programs --Anne Cunningham/Abigail Elmore  [place] DSS  [phone]: Information regarding food stamps, Medicaid, and utility assistance [employer] 336-333-6589   [place] [employer]'s shared-ride transportation service for eligible riders who have a disability that prevents them from riding the fixed route bus.   Medicare Rights Center 800-333-4114 Helps people with  Medicare understand their rights and benefits, navigate the Medicare system, and secure the quality healthcare they deserve [employer] 800-227-2345 Assists patients locate various types of support and financial assistance Cancer Care: 1-800-813-HOPE (4673) Provides financial assistance, online support groups, medication/co-pay assistance.      

## 2018-11-06 NOTE — Telephone Encounter (Signed)
1454 - Notified by lab, potassium 2.9. Dr. Irene Limbo notified.

## 2018-11-07 ENCOUNTER — Telehealth: Payer: Self-pay | Admitting: Hematology

## 2018-11-07 LAB — FOLATE RBC
Folate, Hemolysate: 371 ng/mL
Folate, RBC: 1149 ng/mL (ref >498)
Hematocrit: 32.3 % — ABNORMAL LOW (ref 34.0–46.6)

## 2018-11-07 NOTE — Telephone Encounter (Signed)
No los per 6/30. °

## 2018-11-12 ENCOUNTER — Telehealth: Payer: Self-pay | Admitting: *Deleted

## 2018-11-12 ENCOUNTER — Telehealth: Payer: Self-pay | Admitting: Hematology

## 2018-11-12 NOTE — Telephone Encounter (Signed)
Scheduled appt per 7/06 sch message - pt aware of appt date and time   

## 2018-11-12 NOTE — Telephone Encounter (Signed)
Contacted patient w/information below. Patient verbalized understanding. Patient asked that labs be mailed to her. Placed in addressed envelope and sent through [facility] mail system.

## 2018-11-12 NOTE — Telephone Encounter (Signed)
-----   Message from [facility], MD sent at 11/12/2018  1:00 AM EDT ----- Pl let patient know to take -iron polysaccharide 150 mg po daily OTC + B complex 1 capsule podaily -RTC with Dr Irene Limbo with labs in 3 months. (will need to setup appointment).

## 2019-02-11 ENCOUNTER — Telehealth: Payer: Self-pay | Admitting: Hematology

## 2019-02-11 NOTE — Telephone Encounter (Signed)
Returned patient's phone call regarding rescheduling an appointment, per patient's request 10/07 appointment has moved to 10/19.

## 2019-02-13 ENCOUNTER — Inpatient Hospital Stay: Payer: Medicare Other

## 2019-02-13 ENCOUNTER — Inpatient Hospital Stay: Payer: Medicare Other | Admitting: Hematology

## 2019-02-22 ENCOUNTER — Other Ambulatory Visit: Payer: Self-pay | Admitting: *Deleted

## 2019-02-22 DIAGNOSIS — D509 Iron deficiency anemia, unspecified: Secondary | ICD-10-CM

## 2019-02-25 ENCOUNTER — Inpatient Hospital Stay: Payer: Medicare Other | Admitting: Hematology

## 2019-02-25 ENCOUNTER — Other Ambulatory Visit: Payer: Self-pay

## 2019-02-25 ENCOUNTER — Inpatient Hospital Stay: Payer: Medicare Other | Attending: Hematology

## 2019-02-25 VITALS — BP 139/74 | HR 51 | Temp 98.3°F | Resp 18 | Ht 63.0 in | Wt 195.4 lb

## 2019-02-25 DIAGNOSIS — D509 Iron deficiency anemia, unspecified: Secondary | ICD-10-CM

## 2019-02-25 LAB — CBC WITH DIFFERENTIAL (CANCER CENTER ONLY)
Abs Immature Granulocytes: 0.02 10*3/uL (ref 0.00–0.07)
Basophils Absolute: 0.1 10*3/uL (ref 0.0–0.1)
Basophils Relative: 2 %
Eosinophils Absolute: 0.3 10*3/uL (ref 0.0–0.5)
Eosinophils Relative: 5 %
HCT: 36.6 % (ref 36.0–46.0)
Hemoglobin: 11.3 g/dL — ABNORMAL LOW (ref 12.0–15.0)
Immature Granulocytes: 0 %
Lymphocytes Relative: 23 %
Lymphs Abs: 1.2 10*3/uL (ref 0.7–4.0)
MCH: 25.6 pg — ABNORMAL LOW (ref 26.0–34.0)
MCHC: 30.9 g/dL (ref 30.0–36.0)
MCV: 82.8 fL (ref 80.0–100.0)
Monocytes Absolute: 0.5 10*3/uL (ref 0.1–1.0)
Monocytes Relative: 9 %
Neutro Abs: 3.1 10*3/uL (ref 1.7–7.7)
Neutrophils Relative %: 61 %
Platelet Count: 181 10*3/uL (ref 150–400)
RBC: 4.42 MIL/uL (ref 3.87–5.11)
RDW: 15.3 % (ref 11.5–15.5)
WBC Count: 5.1 10*3/uL (ref 4.0–10.5)
nRBC: 0 % (ref 0.0–0.2)

## 2019-02-25 LAB — CMP (CANCER CENTER ONLY)
ALT: 17 U/L (ref 0–44)
AST: 24 U/L (ref 15–41)
Albumin: 3.8 g/dL (ref 3.5–5.0)
Alkaline Phosphatase: 118 U/L (ref 38–126)
Anion gap: 10 (ref 5–15)
BUN: 12 mg/dL (ref 8–23)
CO2: 24 mmol/L (ref 22–32)
Calcium: 9 mg/dL (ref 8.9–10.3)
Chloride: 108 mmol/L (ref 98–111)
Creatinine: 0.96 mg/dL (ref 0.44–1.00)
GFR, Est AFR Am: >60 mL/min (ref >60)
GFR, Estimated: >60 mL/min (ref >60)
Glucose, Bld: 79 mg/dL (ref 70–99)
Potassium: 4.3 mmol/L (ref 3.5–5.1)
Sodium: 142 mmol/L (ref 135–145)
Total Bilirubin: 0.3 mg/dL (ref 0.3–1.2)
Total Protein: 7 g/dL (ref 6.5–8.1)

## 2019-02-25 LAB — SEDIMENTATION RATE: Sed Rate: 10 mm/hr (ref 0–22)

## 2019-02-25 LAB — RETICULOCYTES
Immature Retic Fract: 11.5 % (ref 2.3–15.9)
RBC.: 4.36 MIL/uL (ref 3.87–5.11)
Retic Count, Absolute: 61.9 10*3/uL (ref 19.0–186.0)
Retic Ct Pct: 1.4 % (ref 0.4–3.1)

## 2019-02-25 NOTE — Progress Notes (Signed)
HEMATOLOGY/ONCOLOGY CONSULTATION NOTE  Date of Service: 02/25/2019  Patient Care Team: Georgann Housekeeper, MD as PCP - General (Internal Medicine) Lyn Records, MD as PCP - Cardiology (Cardiology)  CHIEF COMPLAINTS/PURPOSE OF CONSULTATION:  Anemia  HISTORY OF PRESENTING ILLNESS:   [name] is a wonderful 63 y.o. female who has been referred to [place] by Dr. Georgann Housekeeper for evaluation and management of Anemia. The pt reports that she is doing well overall.   The pt reports that she has had "anemia off and on for the last 3-4 years, but last year was the worst ever." She started PO [place] slow release iron replacement for the last month, in addition to potassium replacement. She takes 3 pills of her PO iron per day (total of  elemental iron). Denies GI intolerance with iron replacement. She denies ice cravings or picca symptoms. She has not had IV iron in the past.  She has taken Omeprazole for 2-3 years and endorses "real bad acid reflux." She had a bleeding ulcer in 2018. She denies ulcers or concerns for GI bleeding recently, and denies blood in the stools or black stools. She notes that she recently had a colonoscopy and endoscopy in the last couple months with [facility] and notes that this was unremarkable. The pt notes that she has had two blood transfusions, one in 2018 with stomach ulcer bleed and second prior to a February 2020 surgery.  The pt had surgery on her left wrist in February 2020 after falling out of her bed and fracturing her bed. She will be having her bed lowered soon. She notes that she also fell a few weeks ago when she lost her balance, and sustained a cut on her forehead requiring stitches. She notes that she has poor balance and endorses peripheral neuropathy. She also endorses fibro myalgia.   The pt notes that she has migraines and uses Excedrin a few times each month.   She notes that she had less fatigue after her blood transfusions, but has  recently been feeling more tired. She denies dietary restrictions. She notes that she has a hard time swallowing occasionally, and has had her esophagus dilated once before. She notes that she doesn't have much of an appetite. She denies unexpected weight loss. She endorses staying very well hydrated with frequent water consumption.  Most recent lab results (06/27/18) of CBC is as follows: all values are WNL except for HGB at 9.6, HCT at 31.1, MCV at 67.9, MCH at 21.0, MCHC at 30.9, RDW at 25.3.  On review of systems, pt reports feeling tired, low appetite, acid reflux, poor balance, and denies picca symptoms, unexpected weight loss, noticing new lumps or bumps, new pain along the spine, abdominal pains, leg swelling, and any other symptoms.   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 On Social Hx the pt denies alcohol consumption or ever smoking cigarettes. On Family Hx the pt denies blood disorders  INTERVAL HISTORY:  [name] is a wonderful 63 y.o. female who is here for evaluation and management of Anemia. The patient's last visit with [place] was on 11/06/2018. The pt reports that she is doing well overall.  The pt reports that she feels about the same as the last visit. She has been taking 150 mg iron polysaccharide, potassium and Vitamin B-complex as directed. She has some issues taking the potassium supplement as the pills are so large and it exacerbates her reflux and GERD. Pt is  very fatigued and is experiencing a flare-up of fibromyalgia. Her PCP is managing the care for her fibromyalgia and she is not sure what her trigger is. Pt is taking 200 mg of Gabapentin TID, as well as Prozac. She is taking Hydrocodone as needed, as many as 6 per day but often fewer. She has been on the Crestor for at least a year or more and there have been no concerns with it causing her muscle pain. Her pain is in spots on her lower arms and legs. Pt has continued to be physically active but  it has not helped her fibromyalgia symptoms. She is continuing to take Lasix and Omeprazole as prescribed.   Lab results today (02/25/19) of CBC w/diff and CMP is as follows: all values are WNL except for Hgb at 11.3, MCH at 25.6. 02/25/2019 Sed rate at 10 02/25/2019 Reticulocytes is as follows: Retic Ct Pct at 1.4, RBC at 4.36, Retic Count Abs at 61.9, Immature Retic Fract at 11.5 02/25/2019 Ferritin is in progress  02/25/2019 Iron and TIBC is in progress  On review of systems, pt reports fatigue, leg and arm pain, back soreness and denies fevers, chills and any other symptoms.   MEDICAL HISTORY:  Past Medical History:  Diagnosis Date   Anemia    Anxiety    Arthritis    "all over" (11/16/2016)   Chronic lower back pain    Chronic pain syndrome    Chronic pain syndrome    "all over" (11/16/2016)   CKD (chronic kidney disease), stage III (HCC)    Daily headache    Depression    Fibromyalgia    GERD (gastroesophageal reflux disease)    High cholesterol    History of blood transfusion    "related to hand laceration"   History of hiatal hernia    History of stomach ulcers    "bled when I was younger" (11/16/2016)   Hypertension    hx; "they took me off my RX" (11/16/2016)   Migraine 1970s   Phlebitis of leg, right    "years ago" (11/16/2016)    SURGICAL HISTORY: Past Surgical History:  Procedure Laterality Date   ABDOMINAL HYSTERECTOMY  1993   w/left ovary   ANKLE FRACTURE SURGERY Bilateral    APPENDECTOMY  1977   BACK SURGERY     CARDIAC CATHETERIZATION  2005   "negative"   CHOLECYSTECTOMY OPEN  1977   ESOPHAGOGASTRODUODENOSCOPY  2008   dx'd GERD   FOREARM FRACTURE SURGERY Right    HIP SURGERY Bilateral    "took out bone spurs"   JOINT REPLACEMENT     KNEE ARTHROSCOPY Bilateral    "total of 18 times"   LACERATION REPAIR Right    hand; "went thru glass door"   LEFT HEART CATH AND CORONARY ANGIOGRAPHY N/A 11/18/2016   Procedure: Left  Heart Cath and Coronary Angiography;  Surgeon: Yvonne Kendall, MD;  Location: [facility];  Service: Cardiovascular;  Laterality: N/A;   LUMBAR FUSION     "put rods in"   LUMBAR LAMINECTOMY/DECOMPRESSION MICRODISCECTOMY  05/2006   OPEN REDUCTION INTERNAL FIXATION (ORIF) DISTAL RADIAL FRACTURE Left 06/18/2018   Procedure: OPEN REDUCTION INTERNAL FIXATION (ORIF) DISTAL RADIAL FRACTURE repair and/or reconstruction;  Surgeon: Bradly Bienenstock, MD;  Location: [facility] OR;  Service: Orthopedics;  Laterality: Left;   REVISION TOTAL KNEE ARTHROPLASTY Bilateral    RIGHT OOPHORECTOMY Right    SHOULDER OPEN ROTATOR CUFF REPAIR Bilateral    left 04/2006   TONSILLECTOMY  TOTAL KNEE ARTHROPLASTY Bilateral    left 1998; right 2004   TUBAL LIGATION      SOCIAL HISTORY: Social History   Socioeconomic History   Marital status: Widowed    Spouse name: Not on file   Number of children: Not on file   Years of education: Not on file   Highest education level: Not on file  Occupational History   Not on file  Social Needs   Financial resource strain: Not on file   Food insecurity    Worry: Not on file    Inability: Not on file   Transportation needs    Medical: Not on file    Non-medical: Not on file  Tobacco Use   Smoking status: Never Smoker   Smokeless tobacco: Never Used  Substance and Sexual Activity   Alcohol use: No   Drug use: No   Sexual activity: Yes  Lifestyle   Physical activity    Days per week: Not on file    Minutes per session: Not on file   Stress: Not on file  Relationships   Social connections    Talks on phone: Not on file    Gets together: Not on file    Attends religious service: Not on file    Active member of club or organization: Not on file    Attends meetings of clubs or organizations: Not on file    Relationship status: Not on file   Intimate partner violence    Fear of current or ex partner: Not on file    Emotionally abused:  Not on file    Physically abused: Not on file    Forced sexual activity: Not on file  Other Topics Concern   Not on file  Social History Narrative   Lives with Myrla Halsted.  Education: college.  Children none.  Retired and Disabled.  Caffeine Drinks tea.    FAMILY HISTORY: Family History  Problem Relation Age of Onset   CAD Father 53   Lung cancer Father    COPD Mother    Emphysema Mother    Other Sister        TRAP syndrome   Diabetes Sister        boarderline   Iron deficiency Sister     ALLERGIES:  is allergic to aspirin and oxybutynin.  MEDICATIONS:  Current Outpatient Medications  Medication Sig Dispense Refill   Acetaminophen-Caffeine (EXCEDRIN ASPIRIN FREE PO) Take 2 tablets by mouth 3 (three) times daily as needed (pain).      ALPRAZolam (XANAX) 1 MG tablet Take 0.5 mg by mouth 3 (three) times daily as needed for anxiety.      Alum & Mag Hydroxide-Simeth (MAALOX ADVANCED PO) Take 15 mLs by mouth 3 (three) times daily as needed (acid reflux).      cyclobenzaprine (FLEXERIL) 10 MG tablet Take 10 mg by mouth 2 (two) times daily as needed for muscle spasms.     ferrous gluconate (FERGON) 324 MG tablet Take 1 tablet (324 mg total) by mouth daily with breakfast.     FLUoxetine (PROZAC) 20 MG capsule Take 60 mg by mouth daily.      furosemide (LASIX) 20 MG tablet Take 10-20 mg by mouth daily as needed for fluid or edema.      gabapentin (NEURONTIN) 100 MG capsule Take 200 mg by mouth 3 (three) times daily.      HYDROcodone-acetaminophen (NORCO/VICODIN) 5-325 MG tablet Take 1 tablet by mouth 3 (three) times daily.  lisinopril (PRINIVIL,ZESTRIL) 5 MG tablet Take 5 mg by mouth daily.     nitroGLYCERIN (NITROSTAT) 0.4 MG SL tablet Place 1 tablet (0.4 mg total) under the tongue every 5 (five) minutes x 3 doses as needed for chest pain. 25 tablet 2   omeprazole (PRILOSEC) 20 MG capsule Take 20 mg by mouth 2 (two) times daily.     potassium chloride  (MICRO-K) 10 MEQ CR capsule Take 10 mEq by mouth 2 (two) times daily.     potassium chloride SA (K-DUR) 20 MEQ tablet Take 1 tablet (20 mEq total) by mouth 2 (two) times daily. Adjust further potassium replacement based on repeat labs with Primary doctor in 7-10 days. 30 tablet 0   promethazine (PHENERGAN) 25 MG tablet Take 12.5 mg by mouth every 8 (eight) hours as needed for nausea or vomiting.      ranitidine (ZANTAC) 150 MG tablet Take 150 mg by mouth daily.      rosuvastatin (CRESTOR) 20 MG tablet TAKE 1 TABLET BY MOUTH ONCE DAILY . APPOINTMENT REQUIRED FOR FUTURE REFILLS 30 tablet 0   No current facility-administered medications for this visit.     REVIEW OF SYSTEMS:   A 10+ POINT REVIEW OF SYSTEMS WAS OBTAINED including neurology, dermatology, psychiatry, cardiac, respiratory, lymph, extremities, GI, GU, Musculoskeletal, constitutional, breasts, reproductive, HEENT.  All pertinent positives are noted in the HPI.  All others are negative.   PHYSICAL EXAMINATION:  Vitals:   02/25/19 1503  BP: 139/74  Pulse: (!) 51  Resp: 18  Temp: 98.3 F (36.8 C)  SpO2: 100%   Filed Weights   02/25/19 1503  Weight: 195 lb 6.4 oz (88.6 kg)   Body mass index is 34.61 kg/m.  GENERAL:alert, in no acute distress and comfortable SKIN: no acute rashes, no significant lesions EYES: conjunctiva are pink and non-injected, sclera anicteric OROPHARYNX: MMM, no exudates, no oropharyngeal erythema or ulceration NECK: supple, no JVD LYMPH:  no palpable lymphadenopathy in the cervical, axillary or inguinal regions LUNGS: clear to auscultation b/l with normal respiratory effort HEART: regular rate & rhythm ABDOMEN:  normoactive bowel sounds , non tender, not distended. No palpable hepatosplenomegaly.  Extremity: no pedal edema PSYCH: alert & oriented x 3 with fluent speech NEURO: no focal motor/sensory deficits  LABORATORY DATA:  I have reviewed the data as listed  . CBC Latest Ref Rng & Units  02/25/2019 11/06/2018 11/06/2018  WBC 4.0 - 10.5 K/uL 5.1 4.5 -  Hemoglobin 12.0 - 15.0 g/dL 11.3(L) 9.6(L) -  Hematocrit 36.0 - 46.0 % 36.6 33.6(L) 32.3(L)  Platelets 150 - 400 K/uL 181 233 -    . CMP Latest Ref Rng & Units 02/25/2019 11/06/2018 06/16/2018  Glucose 70 - 99 mg/dL 79 90 86  BUN 8 - 23 mg/dL 12 14 7(L)  Creatinine 0.44 - 1.00 mg/dL 0.96 1.44(H) 0.90  Sodium 135 - 145 mmol/L 142 145 143  Potassium 3.5 - 5.1 mmol/L 4.3 2.9(LL) 3.3(L)  Chloride 98 - 111 mmol/L 108 107 109  CO2 22 - 32 mmol/L 24 25 22   Calcium 8.9 - 10.3 mg/dL 9.0 8.8(L) 8.6(L)  Total Protein 6.5 - 8.1 g/dL 7.0 7.4 -  Total Bilirubin 0.3 - 1.2 mg/dL 0.3 0.2(L) -  Alkaline Phos 38 - 126 U/L 118 138(H) -  AST 15 - 41 U/L 24 15 -  ALT 0 - 44 U/L 17 7 -   Iron/TIBC/Ferritin/ %Sat    Component Value Date/Time   IRON 153 (H) 11/06/2018 1359   TIBC  464 (H) 11/06/2018 1359   FERRITIN 53 11/06/2018 1359   IRONPCTSAT 33 11/06/2018 1359   Iron/TIBC/Ferritin/ %Sat    Component Value Date/Time   IRON 49 02/25/2019 1439   TIBC 384 02/25/2019 1439   FERRITIN 33 02/25/2019 1439   IRONPCTSAT 13 (L) 02/25/2019 1439   06/27/18 CBC:   Component     Latest Ref Rng & Units 11/06/2018  Retic Ct Pct     0.4 - 3.1 % 3.1  RBC.     3.87 - 5.11 MIL/uL 4.40  Retic Count, Absolute     19.0 - 186.0 K/uL 138.2  Immature Retic Fract     2.3 - 15.9 % 23.1 (H)  Folate, Hemolysate     Not Estab. ng/mL 371.0  HCT     34.0 - 46.6 % 32.3 (L)  Folate, RBC     >498 ng/mL 1,149  Vitamin B12     180 - 914 pg/mL 458  Sed Rate     0 - 22 mm/hr 19   RADIOGRAPHIC STUDIES: I have personally reviewed the radiological images as listed and agreed with the findings in the report. No results found.  ASSESSMENT & PLAN:  63 y.o. female with  1. Iron Deficiency Anemia  PLAN: -Discussed pt labwork today, 02/25/19;  all values are WNL except for Hgb at 11.3, MCH at 25.6. -Discussed 02/25/2019 Sed rate at 10 -Discussed 02/25/2019  Reticulocytes is as follows: Retic Ct Pct at 1.4, RBC at 4.36, Retic Count Abs at 61.9, Immature Retic Fract at 11.5 -Discussed 02/25/2019 Ferritin is 33 -Discussed 02/25/2019 Iron and TIBC is in 13%  -Continue Iron polysaccharide increase to  150 mg po BID OTC + B complex 1 capsule po daily -RTC with Dr Candise CheKale with labs in 6 months  The total time spent in the appt was 15 minutes and more than 50% was on counseling and direct patient cares.  All of the patient's questions were answered with apparent satisfaction. The patient knows to call the clinic with any problems, questions or concerns.   Wyvonnia LoraGautam Brendia Dampier MD MS AAHIVMS [facility] Hematology/Oncology Physician [facility]  (Office):       [phone] (Work cell):  [phone] (Fax):           [phone]  02/25/2019 5:42 PM  I, Carollee HerterJazzmine Knight, am acting as a scribe for Dr. Wyvonnia LoraGautam Anastacio Bua.   .I have reviewed the above documentation for accuracy and completeness, and I agree with the above. Johney Maine.Andrell Bergeson Kishore Clem Wisenbaker MD

## 2019-02-26 ENCOUNTER — Telehealth: Payer: Self-pay | Admitting: Hematology

## 2019-02-26 LAB — IRON AND TIBC
Iron: 49 ug/dL (ref 41–142)
Saturation Ratios: 13 % — ABNORMAL LOW (ref 21–57)
TIBC: 384 ug/dL (ref 236–444)
UIBC: 335 ug/dL (ref 120–384)

## 2019-02-26 LAB — FERRITIN: Ferritin: 33 ng/mL (ref 11–307)

## 2019-02-26 NOTE — Telephone Encounter (Signed)
No los per 10/19. °

## 2019-03-05 ENCOUNTER — Telehealth: Payer: Self-pay | Admitting: Hematology

## 2019-03-05 ENCOUNTER — Telehealth: Payer: Self-pay | Admitting: *Deleted

## 2019-03-05 NOTE — Telephone Encounter (Signed)
Contacted patient regarding test results per Dr. Grier Mitts directions:Please let patient know ferritin is 33 -- would recommend increasing Iron polysaccharide to 150mg  po BID. Please schedule for clinic followup in 6 months with labs. [patient medication list updated] Patient verbalized understanding and knows to expect a call from [facility] Scheduling

## 2019-03-05 NOTE — Telephone Encounter (Signed)
Scheduled appt per 10/27 sch message - mailed reminder letter with appt date and time   

## 2019-04-17 IMAGING — [facility] DIGITAL DIAGNOSTIC UNILATERAL LEFT MAMMOGRAM WITH TOMO AND CAD
6 series · 6 of 18 positions shown · non-contrast
Comparison: Previous exam(s).

ACR Breast Density Category a: The breast tissue is almost entirely
fatty.

CLINICAL DATA: Patient reports a small palpable lump along lower
inner aspect of the left breast, noted for approximately 3 weeks.

EXAM:
DIGITAL DIAGNOSTIC LEFT MAMMOGRAM WITH CAD AND TOMO
ULTRASOUND LEFT BREAST

[L TAN synth-2D]
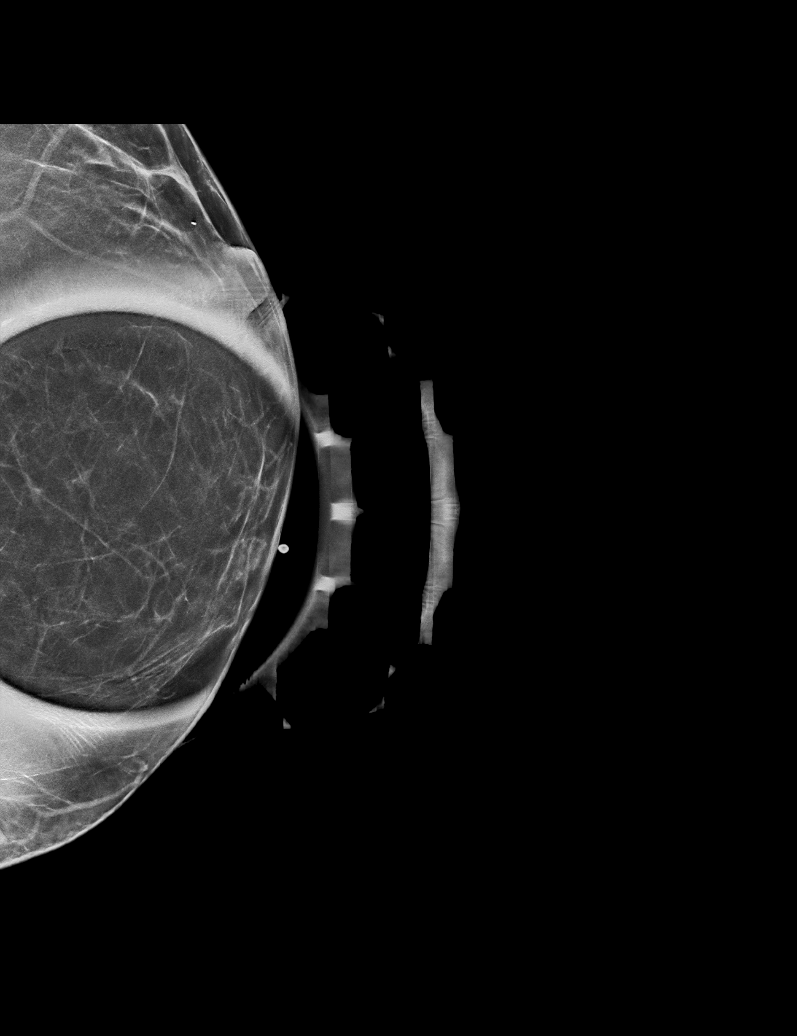

[L CC synth-2D]
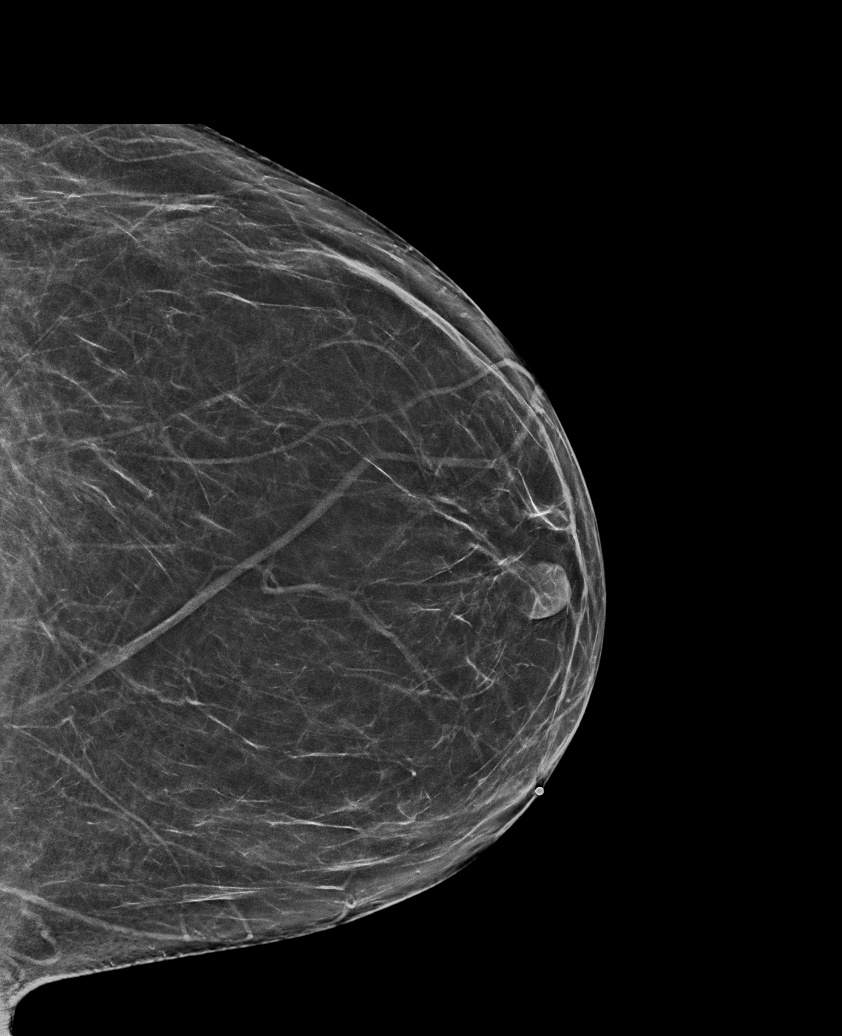

[L MLO synth-2D]
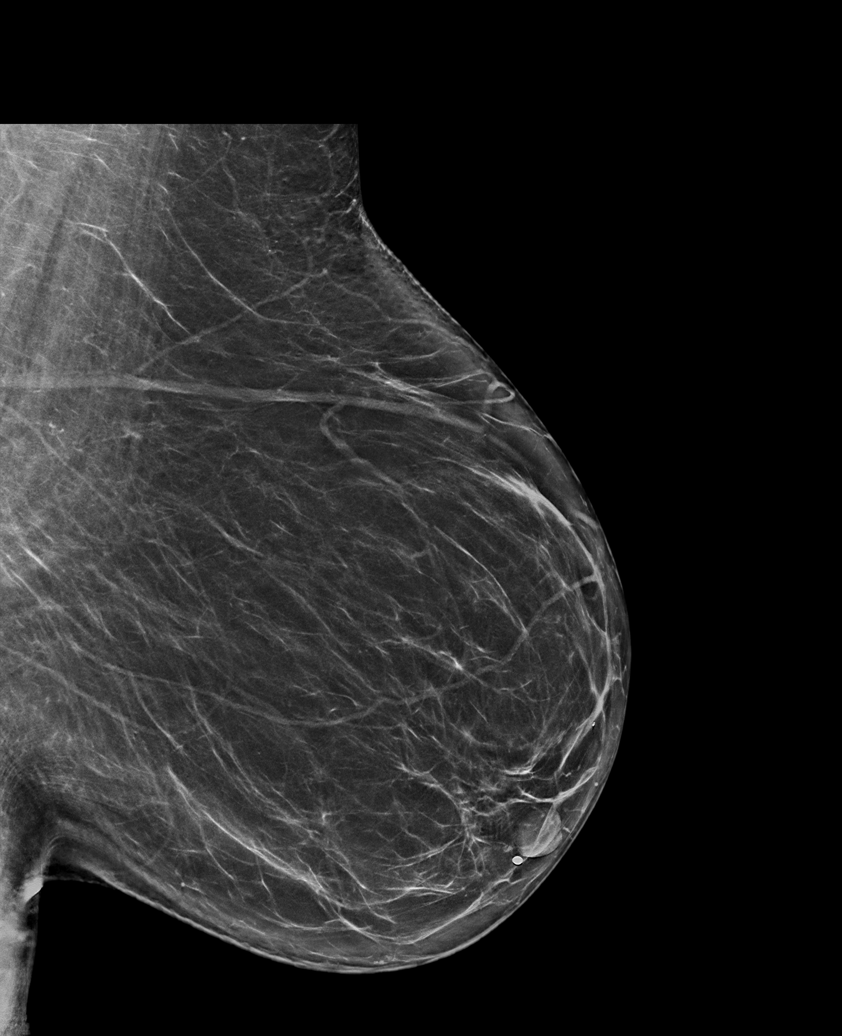

[L MLO tomo · tomo slice 41/81.0]
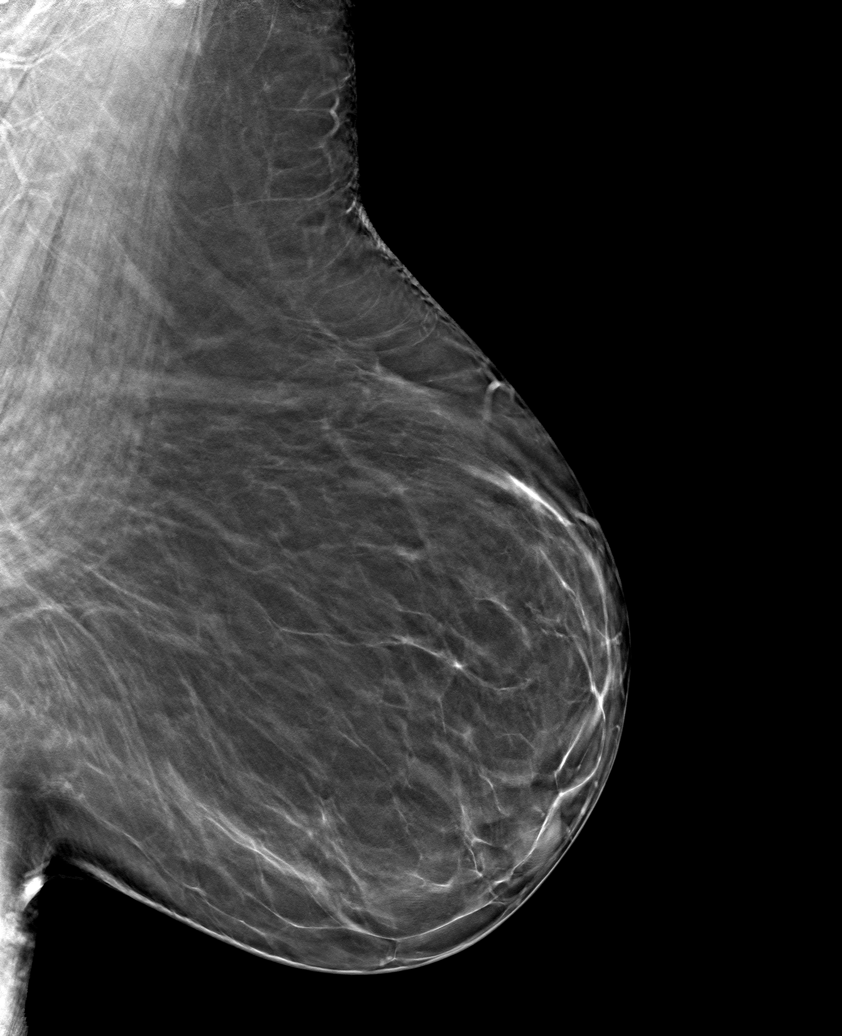

[L TAN tomo · tomo slice 26/51.0]
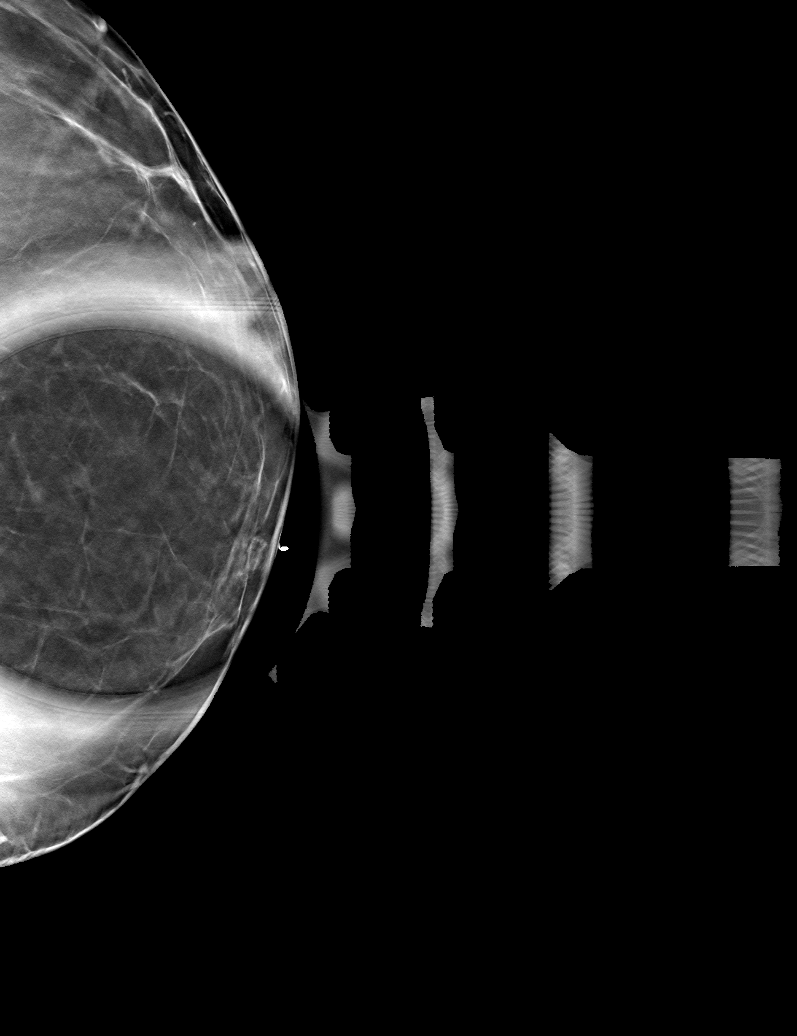

[L CC tomo · tomo slice 35/69.0]
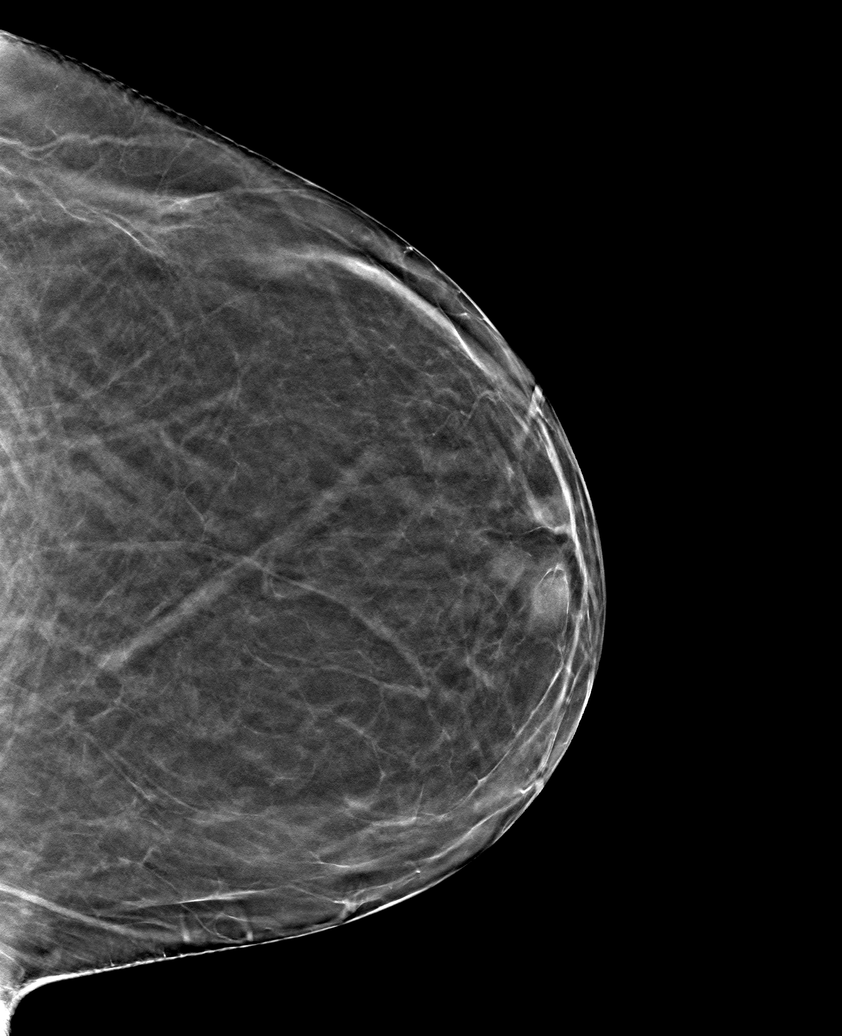

[6 of 18 positions shown; findings below may reference images not displayed]

FINDINGS: There are no discrete masses, areas of architectural distortion,
areas of significant asymmetry or suspicious calcifications. No
abnormality is seen to correspond to the reported palpable
abnormality.

Mammographic images were processed with CAD.

On physical exam, there is a small superficial smooth nodule in the
left breast medially, at approximately 9 o'clock.

Targeted ultrasound is performed, showing a small oval echogenic
area within the superficial breast fat just below the dermis,
measuring 8 x 4 x 6 mm. This corresponds to the palpable
abnormality.
IMPRESSION: 1. No evidence of breast malignancy.
2. Small oval echogenic lesion seen sonographically to correspond to
the small palpable abnormality, with no mammographic correlate. This
is most likely a small area of fat necrosis or a small lipoma.

RECOMMENDATION:
1.  Screening mammogram in August 2018.(Code:[id number]-Q-LZY)
2. If the palpable abnormality appears to be enlarging, repeat
imaging would be indicated.

I have discussed the findings and recommendations with the patient.
Results were also provided in writing at the conclusion of the
visit. If applicable, a reminder letter will be sent to the patient
regarding the next appointment.

BI-RADS CATEGORY  2: Benign.

## 2019-07-11 NOTE — Telephone Encounter (Addendum)
Left a message with office associate in reference to the patient, received fax 07/09/19. I have attempted to reach patient unsuccessfully 2 times (3/2, 3/3) will try later 3/3.----- Message from [facility] sent at 07/09/2019 10:43 AM EST -----  Regarding: ??Arnoldo Hooker  Contact: [phone]  General Message/Vendor Calls    Caller's first and last name: Jayla with [facility]       Reason for call:  Confirm referral info received      Callback required yes/no and why:  Yes, to confirm fax received      Best contact number(s):  [phone], Jayla's direct line      Details to clarify the request:  Luevenia Maxin originally faxed a referral on 01/29 for this pt but never heard back and pt has no upcoming appt in the system.  Luevenia Maxin is going to fax the referral information again today and asks that someone call her to confirm it was received.  We did verify she has the correct fax number.  Dr. Larose Kells is requested but will take first available.        Lorinda Creed

## 2019-07-16 NOTE — Telephone Encounter (Signed)
Tried to reach patient 4 times to schedule a consultation with provider.   March 2  March 3  March 8  March 9  Unable to schedule will notify the referring provider

## 2019-07-18 ENCOUNTER — Other Ambulatory Visit: Payer: Self-pay | Admitting: Internal Medicine

## 2019-07-18 ENCOUNTER — Ambulatory Visit
Admission: RE | Admit: 2019-07-18 | Discharge: 2019-07-18 | Disposition: A | Discharge status: Home / Self Care | Payer: Medicare Other | Source: Ambulatory Visit | Attending: Internal Medicine | Admitting: Internal Medicine

## 2019-07-18 DIAGNOSIS — J189 Pneumonia, unspecified organism: Secondary | ICD-10-CM

## 2019-08-14 ENCOUNTER — Ambulatory Visit
Admission: RE | Admit: 2019-08-14 | Discharge: 2019-08-14 | Disposition: A | Discharge status: Home / Self Care | Payer: Medicare Other | Source: Ambulatory Visit | Attending: Internal Medicine | Admitting: Internal Medicine

## 2019-08-14 ENCOUNTER — Other Ambulatory Visit: Payer: Self-pay | Admitting: Internal Medicine

## 2019-08-14 DIAGNOSIS — J189 Pneumonia, unspecified organism: Secondary | ICD-10-CM

## 2019-09-03 ENCOUNTER — Other Ambulatory Visit: Payer: Self-pay | Admitting: Hematology

## 2019-09-03 ENCOUNTER — Inpatient Hospital Stay: Payer: Medicare Other

## 2019-09-03 ENCOUNTER — Inpatient Hospital Stay: Payer: Medicare Other | Attending: Hematology | Admitting: Hematology

## 2019-09-03 DIAGNOSIS — D509 Iron deficiency anemia, unspecified: Secondary | ICD-10-CM

## 2019-09-16 ENCOUNTER — Ambulatory Visit: Admit: 2019-09-16 | Discharge: 2019-09-16 | Payer: MEDICARE | Attending: Specialist | Primary: Internal Medicine

## 2019-09-16 ENCOUNTER — Ambulatory Visit: Attending: Specialist | Primary: Internal Medicine

## 2019-09-16 DIAGNOSIS — R1030 Lower abdominal pain, unspecified: Secondary | ICD-10-CM

## 2019-09-16 NOTE — Progress Notes (Signed)
Lap Band Follow-Up - History and Physical    Subjective:     The patient is a 64 y.o. obese female with a Body mass index is 31.71 kg/m??.  She had a gastric band placed in 2010 by Luciana Axe.  She is here today requesting the band be removed because it is "not working" and her "stomach hurts".  She goes on to say that Dr. Carlis Abbott attempted to adjust the band 5 years ago and the port would not accept the saline.  She goes on to state that the band is giving her lower abdominal pain and she would simply like to have the band removed with no plans for any further surgery.  Of note is the fact that she has lost approx 50 lbs with the band in place.  When the patient is asked about her pain she points to her lower abdominal region stating that the pain occurs mainly at night.  She has had a colonoscopy recently by Dr. Wilma Flavin and that was reportedly negative.  She did have a CT scan in 2020 and 2020 one of her abdomen and both of those studies were normal.  She does not describe any pain at the port site nor does she describe any reflux, regurgitation, or vomiting episodes.  By reviewing her chart she has not had a general anesthesia since 2013 during a breast biopsy.  Her last cardiac catheterization was in 2019 and showed multiple vessel disease present.    Bariatric comorbidities continue to include:   Patient Active Problem List   Diagnosis Code   ??? Hypertension I10   ??? Heart attack ([place]) I21.9   ??? Diabetes ([place]) E11.9   ??? Diabetes mellitus ([place]) E11.9   ??? Arthritis M19.90   ??? Asthma J45.909   ??? Hypercholesteremia E78.00   ??? GERD (gastroesophageal reflux disease) K21.9   ??? Obese E66.9   ??? Status post gastric banding Z98.84   ??? History of CVA in adulthood Z5.73   ??? History of myocardial infarction I25.2   ??? Smoking history Z87.891        Past Medical History:   Diagnosis Date   ??? Arthritis    ??? Asthma    ??? Diabetes mellitus ([place])    ??? GERD (gastroesophageal reflux disease)    ??? History of CVA in adulthood     "mild"   ???  History of myocardial infarction     3 seperate episodes / 3 stents in place    ??? Hypercholesteremia    ??? Hypertension    ??? Obese    ??? Smoking history     quit 1991   ??? Status post gastric banding 2010    tom clark     Past Surgical History:   Procedure Laterality Date   ??? CARDIAC CATHETERIZATION      stents X 3   ??? HX ABDOMINOPLASTY     ??? HX CHOLECYSTECTOMY  1991   ??? HX GI  2010    gastric band / tom clark      Social History     Tobacco Use   ??? Smoking status: Former Smoker     Quit date: 1991     Years since quitting: 30.3   ??? Smokeless tobacco: Never Used   Substance Use Topics   ??? Alcohol use: No      Family History   Problem Relation Age of Onset   ??? Heart Attack Mother    ??? Cancer Father  LUNG   ??? Breast Cancer Sister       Current Outpatient Medications   Medication Sig Dispense Refill   ??? albuterol (PROVENTIL HFA) 90 mcg/actuation inhaler Proventil HFA 90 mcg/actuation aerosol inhaler     ??? losartan (COZAAR) 25 mg tablet Take 25 mg by mouth.     ??? omeprazole (PRILOSEC) 20 mg capsule Take 20 mg by mouth.     ??? ezetimibe (ZETIA) 10 mg tablet TAKE 1 TABLET DAILY     ??? metoprolol succinate (TOPROL-XL) 50 mg XL tablet Take 50 mg by mouth.     ??? clotrimazole-betamethasone (LOTRISONE) topical cream Apply small amount to external vulva bid prn 15 g 3   ??? acetaminophen (TYLENOL) 650 mg TbER 1,300 mg as needed.     ??? azithromycin (ZITHROMAX) 250 mg tablet azithromycin 250 mg tablet     ??? cetirizine (ZYRTEC) 10 mg tablet as needed.     ??? diclofenac (VOLTAREN) 1 % gel 2 g.     ??? fluticasone (FLONASE ALLERGY RELIEF) 50 mcg/actuation nasal spray Take 2 sprays every day by nasal route.     ??? fluocinoNIDE (LIDEX) 0.05 % topical cream APPLY TO THE AFFECTED AREA(S) BY TOPICAL ROUTE 2 TIMES PER DAY     ??? glucose blood VI test strips (FREESTYLE LITE STRIPS) strip FreeStyle Lite Strips     ??? glucose blood VI test strips (FREESTYLE LITE STRIPS) strip      ??? NOVOLOG FLEXPEN U-100 INSULIN 100 unit/mL inpn      ??? insulin  aspart U-100 (NOVOLOG FLEXPEN U-100 INSULIN) 100 unit/mL inpn      ??? aspirin 81 mg chewable tablet 81 mg daily.     ??? Insulin Needles, Disposable, (SURE COMFORT PEN NEEDLE) 32 gauge x 5/32" ndle Sure Comfort Pen Needle 32 gauge x 5/32"     ??? Insulin Needles, Disposable, (SURE COMFORT PEN NEEDLE) 31 gauge x 3/16" ndle Sure Comfort Pen Needle 31 gauge x 3/16"     ??? fluconazole (DIFLUCAN) 150 mg tablet TAKE 1 TABLET BY MOUTH EVERY DAY FOR 1 DAY 1 Tab 0   ??? metoprolol succinate (TOPROL-XL) 200 mg XL tablet Take 200 mg by mouth daily.     ??? valsartan (DIOVAN) 160 mg tablet Take  by mouth daily.     ??? ezetimibe (ZETIA) 10 mg tablet Take  by mouth.     ??? metFORMIN (GLUCOPHAGE) 500 mg tablet Take  by mouth two (2) times daily (with meals).     ??? pravastatin (PRAVACHOL) 80 mg tablet Take 80 mg by mouth nightly.     ??? montelukast (SINGULAIR) 4 mg Take  by mouth every evening.     ??? traZODone (DESYREL) 100 mg tablet Take 100 mg by mouth nightly.     ??? cholecalciferol, VITAMIN D3, (VITAMIN D3) 5,000 unit tab tablet Take  by mouth daily.     ??? insulin aspart prot/insuln asp (NOVOLOG MIX 70-30 SC) by SubCUTAneous route.     ??? insulin glargine,hum.rec.anlog (LANTUS SC) by SubCUTAneous route.     ??? exemestane (AROMASIN) 25 mg tablet      ??? exenatide microspheres (BYDUREON) 2 mg/0.65 mL pnij 2 mg by SubCUTAneous route.     ??? prasugrel (EFFIENT) 10 mg tablet Take 10 mg by mouth.     ??? exemestane (AROMASIN) 25 mg tablet Take 25 mg by mouth.     ??? exemestane (AROMASIN) 25 mg tablet exemestane 25 mg tablet     ??? gabapentin (NEURONTIN) 100 mg  capsule      ??? HYDROcodone-acetaminophen (NORCO) 5-325 mg per tablet hydrocodone 5 mg-acetaminophen 325 mg tablet     ??? terconazole (TERAZOL 3) 80 mg vaginal suppository terconazole 80 mg vaginal suppository     ??? terconazole (TERAZOL 7) 0.4 % vaginal cream terconazole 0.4 % vaginal cream     ??? terconazole (TERAZOL 7) 0.4 % vaginal cream INSERT 1 APPLICATORFUL BY VAGINAL ROUTE ONCE DAILY AT BEDTIME  FOR 7 DAYS     ??? phentermine-topiramate (QSYMIA) 7.5-46 mg CM24 Qsymia 7.5 mg-46 mg capsule, extended release     ??? prasugrel (EFFIENT) 10 mg tablet Take  by mouth.     ??? gabapentin (NEURONTIN) 300 mg capsule Take 300 mg by mouth three (3) times daily.     ??? cetirizine (ZYRTEC) 5 mg/5 mL solution Take  by mouth.     ??? nitroglycerin (NITROSTAT SL) by SubLINGual route.     ??? exenatide microspheres (BYDUREON SC) by SubCUTAneous route.       Allergies   Allergen Reactions   ??? Ace Inhibitors Cough   ??? Egg Swelling   ??? Egg Derived Swelling   ??? Iodine Rash   ??? Iodine And Iodide Containing Products Itching and Rash   ??? Nut - Unspecified Swelling   ??? Pravastatin Myalgia   ??? Shellfish Derived Other (comments)     WATER POCKETS   ??? Simvastatin Myalgia            Review of Symptoms:     General - No history or complaints of unexpected fever, chills, or weight loss  Head/Neck - No history or complaints of headache, diplopia, dysphagia, hearing loss  Cardiac - No history or complaints of chest pain, palpitations, murmur, or shortness of breath  Pulmonary - No history or complaints of shortness of breath, productive cough, hemoptysis  Gastrointestinal - No history or complaints of reflux,  abdominal pain, obstipation/constipation, blood per rectum  Genitourinary - No history or complaints of hematuria/dysuria, stress urinary incontinence symptoms, or renal lithiasis  Musculoskeletal - No history or complaints of joint pain or muscular weakness  Hematologic - No history or complaints of bleeding disorders, blood transfusions, sickle cell anemia  Neurologic - No history or complaints of  migraine headaches, seizure activity, syncopal episodes, TIA or stroke  Integumentary - No history or complaints of rashes, abnormal nevi, skin cancer  Gynecological -     Objective:     Visit Vitals  BP (!) 163/87 (BP 1 Location: Left upper arm)   Pulse 94   Temp 98.1 ??F (36.7 ??C) (Temporal)   Resp 16   Ht 5' 3"  (1.6 m)   Wt 81.2 kg (179 lb)   SpO2  98%   BMI 31.71 kg/m??       Physical Examination: General appearance - alert, well appearing, and in no distress  Mental status - alert, oriented to person, place, and time  Eyes - pupils equal and reactive, extraocular eye movements intact  Ears - bilateral TM's and external ear canals normal  Nose - normal and patent, no erythema, discharge or polyps  Mouth - mucous membranes moist, pharynx normal without lesions  Neck - supple, no significant adenopathy  Lymphatics - no palpable lymphadenopathy, no hepatosplenomegaly  Chest - clear to auscultation, no wheezes, rales or rhonchi, symmetric air entry  Heart - normal rate, regular rhythm, normal S1, S2, no murmurs, rubs, clicks or gallops  Abdomen - soft, nontender, nondistended, no masses or organomegaly, small umbilical hernia noted  Back exam - full range of motion, no tenderness, palpable spasm or pain on motion  Neurological - alert, oriented, normal speech, no focal findings or movement disorder noted  Musculoskeletal - no joint tenderness, deformity or swelling  Extremities - peripheral pulses normal, no pedal edema, no clubbing or cyanosis  Skin - normal coloration and turgor, no rashes, no suspicious skin lesions noted    Labs / Preoperative Evaluations:     CT ABDOMEN/PELVIS W/ CONTRAST1/27/2021  [facility]  Result Impression     1. ??Colonic diverticulosis without evidence of acute diverticulitis.    2. ??Hepatic steatosis.    3. ??Cholecystectomy.    4. ??Left renal cyst.    5. ??Coronary artery calcifications.    Signed By: Horald Pollen, MD on 06/05/2019 12:43 PM   Result Narrative   Indication: Abdominal pain. ??Elevated LFTs.    Technique: ??Multiple axial CT images were obtained from lung bases to the pubic symphysis following the uneventful administration of oral and IV contrast material. ??Coronal and sagittal reconstructions were generated and reviewed. ??Automated exposure control adjustment of mA and KV according to patient size and/or iterative  reconstruction technique were used for this exam. ??100 cc of Omnipaque 350 was given. ??    Comparison: Abdomen and pelvis CT, 03/15/2019.    ABDOMEN CT WITH CONTRAST:    The lung bases demonstrate no focal abnormality. The heart size is within normal limits. ??Coronary artery calcifications are present. ??The abdominal aorta is normal in caliber. ??There is a small hiatal hernia.    There is diffuse low-attenuation of the liver, consistent with hepatic steatosis.    The gallbladder is surgically absent.    A 5.4 cm cyst is again seen extending off the upper pole of the left kidney.    The spleen, adrenal glands, and pancreas are within normal limits. ??There is no free air or free fluid in the upper abdomen. ??    No lymphadenopathy is identified.    Postsurgical changes status post laparoscopic gastric banding are again noted. ??No bowel obstruction is identified. ??Colonic diverticula are present. ??There is no evidence of acute diverticulitis. ??The appendix is normal.    A small fat-containing umbilical hernia is noted.    The osseous structures are unremarkable.    PELVIS CT WITH CONTRAST:    No pelvic mass or lymphadenopathy are identified. ??The uterus and adnexa are unremarkable. ??The bladder is within normal limits. ??There is no free fluid or loculated fluid collection.   Other Result Information   Interface, [facility] - 06/05/2019 12:45 PM EST  Formatting of this note might be different from the original.  Indication: Abdominal pain.  Elevated LFTs.    Technique:  Multiple axial CT images were obtained from lung bases to the pubic symphysis following the uneventful administration of oral and IV contrast material.  Coronal and sagittal reconstructions were generated and reviewed.  Automated exposure control adjustment of mA and KV according to patient size and/or iterative reconstruction technique were used for this exam.  100 cc of Omnipaque 350 was given.      Comparison: Abdomen and pelvis CT,  03/15/2019.    ABDOMEN CT WITH CONTRAST:    The lung bases demonstrate no focal abnormality. The heart size is within normal limits.  Coronary artery calcifications are present.  The abdominal aorta is normal in caliber.  There is a small hiatal hernia.    There is diffuse low-attenuation of the liver, consistent with hepatic steatosis.  The gallbladder is surgically absent.    A 5.4 cm cyst is again seen extending off the upper pole of the left kidney.    The spleen, adrenal glands, and pancreas are within normal limits.  There is no free air or free fluid in the upper abdomen.      No lymphadenopathy is identified.    Postsurgical changes status post laparoscopic gastric banding are again noted.  No bowel obstruction is identified.  Colonic diverticula are present.  There is no evidence of acute diverticulitis.  The appendix is normal.    A small fat-containing umbilical hernia is noted.    The osseous structures are unremarkable.    PELVIS CT WITH CONTRAST:    No pelvic mass or lymphadenopathy are identified.  The uterus and adnexa are unremarkable.  The bladder is within normal limits.  There is no free fluid or loculated fluid collection.    IMPRESSION    1.  Colonic diverticulosis without evidence of acute diverticulitis.    2.  Hepatic steatosis.    3.  Cholecystectomy.    4.  Left renal cyst.    5.  Coronary artery calcifications.    Signed By: Horald Pollen, MD on 06/05/2019 12:43 PM   Status Results Details   ??            Heart Catheterization11/19/2019  [facility]  Other Result Information   This result has an attachment that is not available.   Result Narrative   Gabriel Rainwater, MD ?? ?? 03/27/2018 ??2:57 PM     Cardiac Catheterization Report  03/27/2018   ADMIT DATE11/19/2019   NOBIE ALLEYNE   MRN [id number]    Procedure:  1.Coronary angiography  2.Left Heart Catheterization   3.iFR LCX  4.iFR LAD  5.PCI LAD     Indication: Abnormal stress test   Operator: Dr Leonarda Salon    Consent:  The risks  benefits and alternatives to cardiac catheterization   and possible percutaneous coronary intervention were discussed in   detail. Risks of bleeding including that needing transfusion,   infection, neuro-vascular injury or arterial closure   complications/infections, vascular thrombosis, acute renal   failure including that needing dialysis, medicine reaction,   allergic reaction, oversedation, heart attack, stroke, arrythmia,   perforation, restenosis, or need for emergency bypass surgery   should angioplasty fail as well as death discussed in detail with   the patient. All questions were answered. The patient verbalized   understanding and consented to proceed.    Procedure:  Patient was prepped and draped ain the usual sterile fashion.  Sedation: Conscious sedation was provided under my supervision.   Vital signs and oxygenation was continuously monitored by staff   during this procedure.  1 mg Versed and 50 micrograms Fentanyl used during procedure  Time of conscious sedation: ?? 1325 to 1426 hrs ??    Access: Right CFA  Contrast: 200 ml Omnipaque  Catheters used: JL4, 3DRC, JR4, XB 3.5 ( 6 Fr)  Blood loss: minimal  Complications: None  Closure: Manual pressure       FINDINGS:  Coronary Artery Anatomy: Right dominant ??  Left Main: Proximal 20%   Left Anterior Descending:Proximal 20%, mid 30% followed by 70% ,   distal focal 50% followed by apical 80%   Left Circumflex:Proximal 50%, followed by mild disease.  Right Coronary Artery: Proximal 10%, mid 30%, distal 40%, RPDA   and RPLV are small vessel with mild diffuse disease  LVEDP: 15 mm  Hg     iFR across the proximal circumflex lesion was 0.99 at rest and   0.95 after intracoronary nitroglycerin suggesting a   non-hemodynamically significant lesion.    IFR across the mid LAD stenosis was 0.89 at rest and 0.85 at its   lowest point suggesting a hemodynamically significant lesion.        Intervention:  Intervention was performed on a 70 % lesion in the mid LAD with    TIMI 3 flow.   Angiomax was used for anticoagulation during the procedure.  A XB 3.5, 6 [place] guide was used to engage the left main. A BMW   wire was used to wire the lesion.A 2 mm balloon was used to   predilate the lesion. A 2 x 18 mm Onyx drug-eluting stent was   deployed at 28 Atm and postdilated under high pressures with   excellent results and residual 0 % stenosis and TIMI 3 flow post   procedure.     Impression:  Successful PCI of mid LAD 70% stenosis (with an IFR of 0.85 post   nitroglycerin and 0.89 at rest) using a 2 x 18 mm Onyx DES   postdilated to 2.25 mm with residual 0% stenosis.    Apical LAD has an 80% lesion which is small and too distal for   PCI and will be medically treated.    Other disease as mentioned above.    Plan:  Restart patient on Effient, continue aspirin.  Continue Dual antiplatelet therapy for at least 12 months   followed by Aspirin for life.   Optimal medical therapy with high intensity statins.  Aggressive risk factor modification for CAD.   Admit to telemetry monitoring.    Gabriel Rainwater, MD  [facility]  Pager # [phone]  03/27/2018 2:48 PM         Assessment:     11 years post tom clark band with abdominal pain that is lower in her abdomen without any other symptoms.    Plan:   At this juncture I have explained to the patient that she has no clear symptoms that can be attributable to the LAP-BAND.  She is certainly high risk for a general anesthesia and a simple lap band removal can be quite dangerous given her level of coronary disease.  I have suggested to her that we bring her to our radiologic clinic for an upper GI study and if the band is normal we will access her port and remove all of the fluid to see if her symptoms abate.  I explained to her that if her evaluation is negative and her symptoms do not resolve with removal of the fluid from her LAP-BAND that an endoscopy might be warranted.       Sep 16, 2019

## 2019-09-16 NOTE — Progress Notes (Signed)
Lap Band Follow-Up - History and Physical    Subjective:     The patient is a 64 y.o. obese female with a Body mass index is 31.71 kg/m??.  She had a gastric band placed in 2010 by Luciana Axe.  She is here today requesting the band be removed because it is "not working" and her "stomach hurts".  She goes on to say that Dr. Carlis Abbott attempted to adjust the band 5 years ago and the port would not accept the saline.  She goes on to state that the band is giving her lower abdominal pain and she would simply like to have the band removed with no plans for any further surgery.  Of note is the fact that she has lost approx 50 lbs with the band in place.  When the patient is asked about her pain she points to her lower abdominal region stating that the pain occurs mainly at night.  She has had a colonoscopy recently by Dr. Wilma Flavin and that was reportedly negative.  She did have a CT scan in 2020 and 2020 one of her abdomen and both of those studies were normal.  She does not describe any pain at the port site nor does she describe any reflux, regurgitation, or vomiting episodes.  By reviewing her chart she has not had a general anesthesia since 2013 during a breast biopsy.  Her last cardiac catheterization was in 2019 and showed multiple vessel disease present.    Bariatric comorbidities continue to include:   Patient Active Problem List   Diagnosis Code   ??? Hypertension I10   ??? Heart attack ([place]) I21.9   ??? Diabetes ([place]) E11.9   ??? Diabetes mellitus ([place]) E11.9   ??? Arthritis M19.90   ??? Asthma J45.909   ??? Hypercholesteremia E78.00   ??? GERD (gastroesophageal reflux disease) K21.9   ??? Obese E66.9   ??? Status post gastric banding Z98.84   ??? History of CVA in adulthood Z5.73   ??? History of myocardial infarction I25.2   ??? Smoking history Z87.891        Past Medical History:   Diagnosis Date   ??? Arthritis    ??? Asthma    ??? Diabetes mellitus ([place])    ??? GERD (gastroesophageal reflux disease)    ??? History of CVA in adulthood     "mild"   ???  History of myocardial infarction     3 seperate episodes / 3 stents in place    ??? Hypercholesteremia    ??? Hypertension    ??? Obese    ??? Smoking history     quit 1991   ??? Status post gastric banding 2010    tom clark     Past Surgical History:   Procedure Laterality Date   ??? CARDIAC CATHETERIZATION      stents X 3   ??? HX ABDOMINOPLASTY     ??? HX CHOLECYSTECTOMY  1991   ??? HX GI  2010    gastric band / tom clark      Social History     Tobacco Use   ??? Smoking status: Former Smoker     Quit date: 1991     Years since quitting: 30.3   ??? Smokeless tobacco: Never Used   Substance Use Topics   ??? Alcohol use: No      Family History   Problem Relation Age of Onset   ??? Heart Attack Mother    ??? Cancer Father  LUNG   ??? Breast Cancer Sister       Current Outpatient Medications   Medication Sig Dispense Refill   ??? albuterol (PROVENTIL HFA) 90 mcg/actuation inhaler Proventil HFA 90 mcg/actuation aerosol inhaler     ??? losartan (COZAAR) 25 mg tablet Take 25 mg by mouth.     ??? omeprazole (PRILOSEC) 20 mg capsule Take 20 mg by mouth.     ??? ezetimibe (ZETIA) 10 mg tablet TAKE 1 TABLET DAILY     ??? metoprolol succinate (TOPROL-XL) 50 mg XL tablet Take 50 mg by mouth.     ??? clotrimazole-betamethasone (LOTRISONE) topical cream Apply small amount to external vulva bid prn 15 g 3   ??? acetaminophen (TYLENOL) 650 mg TbER 1,300 mg as needed.     ??? azithromycin (ZITHROMAX) 250 mg tablet azithromycin 250 mg tablet     ??? cetirizine (ZYRTEC) 10 mg tablet as needed.     ??? diclofenac (VOLTAREN) 1 % gel 2 g.     ??? fluticasone (FLONASE ALLERGY RELIEF) 50 mcg/actuation nasal spray Take 2 sprays every day by nasal route.     ??? fluocinoNIDE (LIDEX) 0.05 % topical cream APPLY TO THE AFFECTED AREA(S) BY TOPICAL ROUTE 2 TIMES PER DAY     ??? glucose blood VI test strips (FREESTYLE LITE STRIPS) strip FreeStyle Lite Strips     ??? glucose blood VI test strips (FREESTYLE LITE STRIPS) strip      ??? NOVOLOG FLEXPEN U-100 INSULIN 100 unit/mL inpn      ??? insulin  aspart U-100 (NOVOLOG FLEXPEN U-100 INSULIN) 100 unit/mL inpn      ??? aspirin 81 mg chewable tablet 81 mg daily.     ??? Insulin Needles, Disposable, (SURE COMFORT PEN NEEDLE) 32 gauge x 5/32" ndle Sure Comfort Pen Needle 32 gauge x 5/32"     ??? Insulin Needles, Disposable, (SURE COMFORT PEN NEEDLE) 31 gauge x 3/16" ndle Sure Comfort Pen Needle 31 gauge x 3/16"     ??? fluconazole (DIFLUCAN) 150 mg tablet TAKE 1 TABLET BY MOUTH EVERY DAY FOR 1 DAY 1 Tab 0   ??? metoprolol succinate (TOPROL-XL) 200 mg XL tablet Take 200 mg by mouth daily.     ??? valsartan (DIOVAN) 160 mg tablet Take  by mouth daily.     ??? ezetimibe (ZETIA) 10 mg tablet Take  by mouth.     ??? metFORMIN (GLUCOPHAGE) 500 mg tablet Take  by mouth two (2) times daily (with meals).     ??? pravastatin (PRAVACHOL) 80 mg tablet Take 80 mg by mouth nightly.     ??? montelukast (SINGULAIR) 4 mg Take  by mouth every evening.     ??? traZODone (DESYREL) 100 mg tablet Take 100 mg by mouth nightly.     ??? cholecalciferol, VITAMIN D3, (VITAMIN D3) 5,000 unit tab tablet Take  by mouth daily.     ??? insulin aspart prot/insuln asp (NOVOLOG MIX 70-30 SC) by SubCUTAneous route.     ??? insulin glargine,hum.rec.anlog (LANTUS SC) by SubCUTAneous route.     ??? exemestane (AROMASIN) 25 mg tablet      ??? exenatide microspheres (BYDUREON) 2 mg/0.65 mL pnij 2 mg by SubCUTAneous route.     ??? prasugrel (EFFIENT) 10 mg tablet Take 10 mg by mouth.     ??? exemestane (AROMASIN) 25 mg tablet Take 25 mg by mouth.     ??? exemestane (AROMASIN) 25 mg tablet exemestane 25 mg tablet     ??? gabapentin (NEURONTIN) 100 mg  capsule      ??? HYDROcodone-acetaminophen (NORCO) 5-325 mg per tablet hydrocodone 5 mg-acetaminophen 325 mg tablet     ??? terconazole (TERAZOL 3) 80 mg vaginal suppository terconazole 80 mg vaginal suppository     ??? terconazole (TERAZOL 7) 0.4 % vaginal cream terconazole 0.4 % vaginal cream     ??? terconazole (TERAZOL 7) 0.4 % vaginal cream INSERT 1 APPLICATORFUL BY VAGINAL ROUTE ONCE DAILY AT BEDTIME  FOR 7 DAYS     ??? phentermine-topiramate (QSYMIA) 7.5-46 mg CM24 Qsymia 7.5 mg-46 mg capsule, extended release     ??? prasugrel (EFFIENT) 10 mg tablet Take  by mouth.     ??? gabapentin (NEURONTIN) 300 mg capsule Take 300 mg by mouth three (3) times daily.     ??? cetirizine (ZYRTEC) 5 mg/5 mL solution Take  by mouth.     ??? nitroglycerin (NITROSTAT SL) by SubLINGual route.     ??? exenatide microspheres (BYDUREON SC) by SubCUTAneous route.       Allergies   Allergen Reactions   ??? Ace Inhibitors Cough   ??? Egg Swelling   ??? Egg Derived Swelling   ??? Iodine Rash   ??? Iodine And Iodide Containing Products Itching and Rash   ??? Nut - Unspecified Swelling   ??? Pravastatin Myalgia   ??? Shellfish Derived Other (comments)     WATER POCKETS   ??? Simvastatin Myalgia            Review of Symptoms:     General - No history or complaints of unexpected fever, chills, or weight loss  Head/Neck - No history or complaints of headache, diplopia, dysphagia, hearing loss  Cardiac - No history or complaints of chest pain, palpitations, murmur, or shortness of breath  Pulmonary - No history or complaints of shortness of breath, productive cough, hemoptysis  Gastrointestinal - No history or complaints of reflux,  abdominal pain, obstipation/constipation, blood per rectum  Genitourinary - No history or complaints of hematuria/dysuria, stress urinary incontinence symptoms, or renal lithiasis  Musculoskeletal - No history or complaints of joint pain or muscular weakness  Hematologic - No history or complaints of bleeding disorders, blood transfusions, sickle cell anemia  Neurologic - No history or complaints of  migraine headaches, seizure activity, syncopal episodes, TIA or stroke  Integumentary - No history or complaints of rashes, abnormal nevi, skin cancer  Gynecological -     Objective:     Visit Vitals  BP (!) 163/87 (BP 1 Location: Left upper arm)   Pulse 94   Temp 98.1 ??F (36.7 ??C) (Temporal)   Resp 16   Ht 5' 3"  (1.6 m)   Wt 81.2 kg (179 lb)   SpO2  98%   BMI 31.71 kg/m??       Physical Examination: General appearance - alert, well appearing, and in no distress  Mental status - alert, oriented to person, place, and time  Eyes - pupils equal and reactive, extraocular eye movements intact  Ears - bilateral TM's and external ear canals normal  Nose - normal and patent, no erythema, discharge or polyps  Mouth - mucous membranes moist, pharynx normal without lesions  Neck - supple, no significant adenopathy  Lymphatics - no palpable lymphadenopathy, no hepatosplenomegaly  Chest - clear to auscultation, no wheezes, rales or rhonchi, symmetric air entry  Heart - normal rate, regular rhythm, normal S1, S2, no murmurs, rubs, clicks or gallops  Abdomen - soft, nontender, nondistended, no masses or organomegaly, small umbilical hernia noted  Back exam - full range of motion, no tenderness, palpable spasm or pain on motion  Neurological - alert, oriented, normal speech, no focal findings or movement disorder noted  Musculoskeletal - no joint tenderness, deformity or swelling  Extremities - peripheral pulses normal, no pedal edema, no clubbing or cyanosis  Skin - normal coloration and turgor, no rashes, no suspicious skin lesions noted    Labs / Preoperative Evaluations:     CT ABDOMEN/PELVIS W/ CONTRAST1/27/2021  [facility]  Result Impression     1. ??Colonic diverticulosis without evidence of acute diverticulitis.    2. ??Hepatic steatosis.    3. ??Cholecystectomy.    4. ??Left renal cyst.    5. ??Coronary artery calcifications.    Signed By: Horald Pollen, MD on 06/05/2019 12:43 PM   Result Narrative   Indication: Abdominal pain. ??Elevated LFTs.    Technique: ??Multiple axial CT images were obtained from lung bases to the pubic symphysis following the uneventful administration of oral and IV contrast material. ??Coronal and sagittal reconstructions were generated and reviewed. ??Automated exposure control adjustment of mA and KV according to patient size and/or iterative  reconstruction technique were used for this exam. ??100 cc of Omnipaque 350 was given. ??    Comparison: Abdomen and pelvis CT, 03/15/2019.    ABDOMEN CT WITH CONTRAST:    The lung bases demonstrate no focal abnormality. The heart size is within normal limits. ??Coronary artery calcifications are present. ??The abdominal aorta is normal in caliber. ??There is a small hiatal hernia.    There is diffuse low-attenuation of the liver, consistent with hepatic steatosis.    The gallbladder is surgically absent.    A 5.4 cm cyst is again seen extending off the upper pole of the left kidney.    The spleen, adrenal glands, and pancreas are within normal limits. ??There is no free air or free fluid in the upper abdomen. ??    No lymphadenopathy is identified.    Postsurgical changes status post laparoscopic gastric banding are again noted. ??No bowel obstruction is identified. ??Colonic diverticula are present. ??There is no evidence of acute diverticulitis. ??The appendix is normal.    A small fat-containing umbilical hernia is noted.    The osseous structures are unremarkable.    PELVIS CT WITH CONTRAST:    No pelvic mass or lymphadenopathy are identified. ??The uterus and adnexa are unremarkable. ??The bladder is within normal limits. ??There is no free fluid or loculated fluid collection.   Other Result Information   Interface, [facility] - 06/05/2019 12:45 PM EST  Formatting of this note might be different from the original.  Indication: Abdominal pain.  Elevated LFTs.    Technique:  Multiple axial CT images were obtained from lung bases to the pubic symphysis following the uneventful administration of oral and IV contrast material.  Coronal and sagittal reconstructions were generated and reviewed.  Automated exposure control adjustment of mA and KV according to patient size and/or iterative reconstruction technique were used for this exam.  100 cc of Omnipaque 350 was given.      Comparison: Abdomen and pelvis CT, 03/15/2019.     ABDOMEN CT WITH CONTRAST:    The lung bases demonstrate no focal abnormality. The heart size is within normal limits.  Coronary artery calcifications are present.  The abdominal aorta is normal in caliber.  There is a small hiatal hernia.    There is diffuse low-attenuation of the liver, consistent with hepatic steatosis.  The gallbladder is surgically absent.    A 5.4 cm cyst is again seen extending off the upper pole of the left kidney.    The spleen, adrenal glands, and pancreas are within normal limits.  There is no free air or free fluid in the upper abdomen.      No lymphadenopathy is identified.    Postsurgical changes status post laparoscopic gastric banding are again noted.  No bowel obstruction is identified.  Colonic diverticula are present.  There is no evidence of acute diverticulitis.  The appendix is normal.    A small fat-containing umbilical hernia is noted.    The osseous structures are unremarkable.    PELVIS CT WITH CONTRAST:    No pelvic mass or lymphadenopathy are identified.  The uterus and adnexa are unremarkable.  The bladder is within normal limits.  There is no free fluid or loculated fluid collection.    IMPRESSION    1.  Colonic diverticulosis without evidence of acute diverticulitis.    2.  Hepatic steatosis.    3.  Cholecystectomy.    4.  Left renal cyst.    5.  Coronary artery calcifications.    Signed By: Horald Pollen, MD on 06/05/2019 12:43 PM   Status Results Details   ??            Heart Catheterization11/19/2019  [facility]  Other Result Information   This result has an attachment that is not available.   Result Narrative   Gabriel Rainwater, MD ?? ?? 03/27/2018 ??2:57 PM     Cardiac Catheterization Report  03/27/2018   ADMIT DATE11/19/2019   TYTIONNA CLOYD   MRN [id number]    Procedure:  1.Coronary angiography  2.Left Heart Catheterization   3.iFR LCX  4.iFR LAD  5.PCI LAD     Indication: Abnormal stress test   Operator: Dr Leonarda Salon    Consent:  The risks benefits and  alternatives to cardiac catheterization   and possible percutaneous coronary intervention were discussed in   detail. Risks of bleeding including that needing transfusion,   infection, neuro-vascular injury or arterial closure   complications/infections, vascular thrombosis, acute renal   failure including that needing dialysis, medicine reaction,   allergic reaction, oversedation, heart attack, stroke, arrythmia,   perforation, restenosis, or need for emergency bypass surgery   should angioplasty fail as well as death discussed in detail with   the patient. All questions were answered. The patient verbalized   understanding and consented to proceed.    Procedure:  Patient was prepped and draped ain the usual sterile fashion.  Sedation: Conscious sedation was provided under my supervision.   Vital signs and oxygenation was continuously monitored by staff   during this procedure.  1 mg Versed and 50 micrograms Fentanyl used during procedure  Time of conscious sedation: ?? 1325 to 1426 hrs ??    Access: Right CFA  Contrast: 200 ml Omnipaque  Catheters used: JL4, 3DRC, JR4, XB 3.5 ( 6 Fr)  Blood loss: minimal  Complications: None  Closure: Manual pressure       FINDINGS:  Coronary Artery Anatomy: Right dominant ??  Left Main: Proximal 20%   Left Anterior Descending:Proximal 20%, mid 30% followed by 70% ,   distal focal 50% followed by apical 80%   Left Circumflex:Proximal 50%, followed by mild disease.  Right Coronary Artery: Proximal 10%, mid 30%, distal 40%, RPDA   and RPLV are small vessel with mild diffuse disease  LVEDP: 15 mm  Hg     iFR across the proximal circumflex lesion was 0.99 at rest and   0.95 after intracoronary nitroglycerin suggesting a   non-hemodynamically significant lesion.    IFR across the mid LAD stenosis was 0.89 at rest and 0.85 at its   lowest point suggesting a hemodynamically significant lesion.        Intervention:  Intervention was performed on a 70 % lesion in the mid LAD with   TIMI 3  flow.   Angiomax was used for anticoagulation during the procedure.  A XB 3.5, 6 [place] guide was used to engage the left main. A BMW   wire was used to wire the lesion.A 2 mm balloon was used to   predilate the lesion. A 2 x 18 mm Onyx drug-eluting stent was   deployed at 84 Atm and postdilated under high pressures with   excellent results and residual 0 % stenosis and TIMI 3 flow post   procedure.     Impression:  Successful PCI of mid LAD 70% stenosis (with an IFR of 0.85 post   nitroglycerin and 0.89 at rest) using a 2 x 18 mm Onyx DES   postdilated to 2.25 mm with residual 0% stenosis.    Apical LAD has an 80% lesion which is small and too distal for   PCI and will be medically treated.    Other disease as mentioned above.    Plan:  Restart patient on Effient, continue aspirin.  Continue Dual antiplatelet therapy for at least 12 months   followed by Aspirin for life.   Optimal medical therapy with high intensity statins.  Aggressive risk factor modification for CAD.   Admit to telemetry monitoring.    Gabriel Rainwater, MD  [facility]  Pager # [phone]  03/27/2018 2:48 PM         Assessment:     11 years post tom clark band with abdominal pain that is lower in her abdomen without any other symptoms.    Plan:   At this juncture I have explained to the patient that she has no clear symptoms that can be attributable to the LAP-BAND.  She is certainly high risk for a general anesthesia and a simple lap band removal can be quite dangerous given her level of coronary disease.  I have suggested to her that we bring her to our radiologic clinic for an upper GI study and if the band is normal we will access her port and remove all of the fluid to see if her symptoms abate.  I explained to her that if her evaluation is negative and her symptoms do not resolve with removal of the fluid from her LAP-BAND that an endoscopy might be warranted.       Sep 16, 2019

## 2019-09-24 ENCOUNTER — Other Ambulatory Visit: Payer: Self-pay | Admitting: Internal Medicine

## 2019-09-24 DIAGNOSIS — E2839 Other primary ovarian failure: Secondary | ICD-10-CM

## 2019-09-24 DIAGNOSIS — Z1231 Encounter for screening mammogram for malignant neoplasm of breast: Secondary | ICD-10-CM

## 2019-09-26 ENCOUNTER — Inpatient Hospital Stay: Admit: 2019-09-26 | Payer: MEDICARE | Primary: Internal Medicine

## 2019-09-26 ENCOUNTER — Ambulatory Visit: Admit: 2019-09-26 | Discharge: 2019-09-26 | Payer: MEDICARE | Attending: Hospitalist | Primary: Internal Medicine

## 2019-09-26 ENCOUNTER — Ambulatory Visit: Attending: Hospitalist | Primary: Internal Medicine

## 2019-09-26 DIAGNOSIS — K760 Fatty (change of) liver, not elsewhere classified: Secondary | ICD-10-CM

## 2019-09-26 LAB — CBC WITH AUTO DIFFERENTIAL
Basophils %: 1 % (ref 0–2)
Basophils Absolute: 0.1 10*3/uL (ref 0.0–0.1)
Eosinophils %: 4 % (ref 0–5)
Eosinophils Absolute: 0.3 10*3/uL (ref 0.0–0.4)
Hematocrit: 43 % (ref 35.0–45.0)
Hemoglobin: 14.1 g/dL (ref 12.0–16.0)
Lymphocytes %: 41 % (ref 21–52)
Lymphocytes Absolute: 2.7 10*3/uL (ref 0.9–3.6)
MCH: 28.5 PG (ref 24.0–34.0)
MCHC: 32.8 g/dL (ref 31.0–37.0)
MCV: 86.9 FL (ref 74.0–97.0)
MPV: 9.8 FL (ref 9.2–11.8)
Monocytes %: 7 % (ref 3–10)
Monocytes Absolute: 0.4 10*3/uL (ref 0.05–1.2)
Neutrophils %: 48 % (ref 40–73)
Neutrophils Absolute: 3.2 10*3/uL (ref 1.8–8.0)
Platelets: 295 10*3/uL (ref 135–420)
RBC: 4.95 M/uL (ref 4.20–5.30)
RDW: 12.4 % (ref 11.6–14.5)
WBC: 6.7 10*3/uL (ref 4.6–13.2)

## 2019-09-26 LAB — COMPREHENSIVE METABOLIC PANEL
ALT: 41 U/L (ref 13–56)
AST: 48 U/L — ABNORMAL HIGH (ref 10–38)
Albumin/Globulin Ratio: 0.9 (ref 0.8–1.7)
Albumin: 3.9 g/dL (ref 3.4–5.0)
Alkaline Phosphatase: 65 U/L (ref 45–117)
Anion Gap: 7 mmol/L (ref 3.0–18)
BUN: 15 MG/DL (ref 7.0–18)
Bun/Cre Ratio: 16 (ref 12–20)
CO2: 29 mmol/L (ref 21–32)
Calcium: 10 MG/DL (ref 8.5–10.1)
Chloride: 105 mmol/L (ref 100–111)
Creatinine: 0.92 MG/DL (ref 0.6–1.3)
EGFR IF NonAfrican American: >60 mL/min/{1.73_m2} (ref >60)
GFR African American: >60 mL/min/{1.73_m2} (ref >60)
Globulin: 4.2 g/dL — ABNORMAL HIGH (ref 2.0–4.0)
Glucose: 155 mg/dL — ABNORMAL HIGH (ref 74–99)
Potassium: 3.7 mmol/L (ref 3.5–5.5)
Sodium: 141 mmol/L (ref 136–145)
Total Bilirubin: 0.4 MG/DL (ref 0.2–1.0)
Total Protein: 8.1 g/dL (ref 6.4–8.2)

## 2019-09-26 LAB — METABOLIC PANEL, COMPREHENSIVE
A-G Ratio: 0.9 (ref 0.8–1.7)
ALT (SGPT): 41 U/L (ref 13–56)
AST (SGOT): 48 U/L — ABNORMAL HIGH (ref 10–38)
Albumin: 3.9 g/dL (ref 3.4–5.0)
Alk. phosphatase: 65 U/L (ref 45–117)
Anion gap: 7 mmol/L (ref 3.0–18)
BUN/Creatinine ratio: 16 (ref 12–20)
BUN: 15 MG/DL (ref 7.0–18)
Bilirubin, total: 0.4 MG/DL (ref 0.2–1.0)
CO2: 29 mmol/L (ref 21–32)
Calcium: 10 MG/DL (ref 8.5–10.1)
Chloride: 105 mmol/L (ref 100–111)
Creatinine: 0.92 MG/DL (ref 0.6–1.3)
GFR est AA: >60 mL/min/{1.73_m2} (ref >60)
GFR est non-AA: >60 mL/min/{1.73_m2} (ref >60)
Globulin: 4.2 g/dL — ABNORMAL HIGH (ref 2.0–4.0)
Glucose: 155 mg/dL — ABNORMAL HIGH (ref 74–99)
Potassium: 3.7 mmol/L (ref 3.5–5.5)
Protein, total: 8.1 g/dL (ref 6.4–8.2)
Sodium: 141 mmol/L (ref 136–145)

## 2019-09-26 LAB — CBC WITH AUTOMATED DIFF
ABS. BASOPHILS: 0.1 10*3/uL (ref 0.0–0.1)
ABS. EOSINOPHILS: 0.3 10*3/uL (ref 0.0–0.4)
ABS. LYMPHOCYTES: 2.7 10*3/uL (ref 0.9–3.6)
ABS. MONOCYTES: 0.4 10*3/uL (ref 0.05–1.2)
ABS. NEUTROPHILS: 3.2 10*3/uL (ref 1.8–8.0)
BASOPHILS: 1 % (ref 0–2)
EOSINOPHILS: 4 % (ref 0–5)
HCT: 43 % (ref 35.0–45.0)
HGB: 14.1 g/dL (ref 12.0–16.0)
LYMPHOCYTES: 41 % (ref 21–52)
MCH: 28.5 PG (ref 24.0–34.0)
MCHC: 32.8 g/dL (ref 31.0–37.0)
MCV: 86.9 FL (ref 74.0–97.0)
MONOCYTES: 7 % (ref 3–10)
MPV: 9.8 FL (ref 9.2–11.8)
NEUTROPHILS: 48 % (ref 40–73)
PLATELET: 295 10*3/uL (ref 135–420)
RBC: 4.95 M/uL (ref 4.20–5.30)
RDW: 12.4 % (ref 11.6–14.5)
WBC: 6.7 10*3/uL (ref 4.6–13.2)

## 2019-09-26 NOTE — Progress Notes (Signed)
[place]      Thedore Mins, MD, FACP, [place], [place]    Malachi Pro, MD, MPH      Arlyss Queen, PA-C    Genella Rife, ACNP-BC     April S [place]worth, AGPCNP-BC   Demetrios Isaacs, FNP-C    Marlyce Huge, AGPCNP-BC       [place]    at [facility]    [address] [place]  [address]    [phone]    FAX: [email or web address]    at [facility]    [address] [place]  [address]    [phone]    FAX: [phone]     Patient Care Team:  Hewitt Shorts, MD as PCP - General (Internal Medicine)    Problem List  Date Reviewed: Jan 15, 2017        Codes Class Noted    Diabetes mellitus ([place]) ICD-10-CM: E11.9  ICD-9-CM: 250.00  Unknown        Arthritis ICD-10-CM: M19.90  ICD-9-CM: 716.90  Unknown        Asthma ICD-10-CM: J45.909  ICD-9-CM: 493.90  Unknown        Hypercholesteremia ICD-10-CM: E78.00  ICD-9-CM: 272.0  Unknown        GERD (gastroesophageal reflux disease) ICD-10-CM: K21.9  ICD-9-CM: 530.81  Unknown        Obese ICD-10-CM: E66.9  ICD-9-CM: 278.00  Unknown        History of CVA in adulthood ICD-10-CM: Z86.73  ICD-9-CM: V12.54  Unknown        History of myocardial infarction ICD-10-CM: I25.2  ICD-9-CM: 841  Unknown    Overview Signed 09/16/2019  8:33 AM by Noland Fordyce, PA     3 seperate episodes / 3 stents in place             Smoking history ICD-10-CM: Z87.891  ICD-9-CM: V15.82  Unknown    Overview Signed 09/16/2019  8:38 AM by Noland Fordyce, PA     quit 1991             Hypertension ICD-10-CM: I10  ICD-9-CM: 401.9  Unknown        Heart attack ([place]) ICD-10-CM: I21.9  ICD-9-CM: 410.90  Unknown        Diabetes ([place]) ICD-10-CM: E11.9  ICD-9-CM: 250.00  Unknown        Status post gastric banding ICD-10-CM: Z98.84  ICD-9-CM: V45.86  2010    Overview Signed 09/16/2019  8:31 AM  by Noland Fordyce, PA     tom clark                 The clinicians listed above have asked me to see Shannon Zimmerman in consultation regarding suspected fatty liver disease and its management.    No medical records were available for review when the patient was here for the appointment.      The patient is a 64 y.o. Black female who was found to have fatty liver during obesity surgery in 2010     The most recent laboratory studies are not available.      Serologic evaluation for markers of chronic liver disease has either not been performed or the results are not available.  Cross sectional imaging of the liver performed with CT scan demonstrates??hepatic steatosis,??cholecystectomy,??left renal cyst.     An assessment of liver fibrosis with biopsy or elastography has not been performed.      The patient  had not started any new medications within 3 months preceding the elevation in liver chemistries.    The patient reports lower abdominal pain which she describes as chronic.    The patient is not experiencing the following symptoms which are commonly seen in this liver disorder: fatigue, pain in the right side over the liver,   yellowing of the eyes or skin, problems concentrating, swelling of the abdomen, swelling of the lower extremities, hematemesis, hematochezia.    The patient completes all daily activities without any functional limitations.      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        AST is elevated.  ALT is normal. Liver function is normal. The platelet count is normal.      Have performed laboratory testing to monitor liver function and degree of liver injury.    This included BMP, hepatic panel, CBC with platelet count,     Serologic testing for causes of chronic liver disease was ordered.      Will perform  imaging of the liver with ultrasound.      If the patient looses 20% of current body weight, which is 34 pounds, down to a weight of 144 pounds, all steatosis will have resolved.    Once all steatosis has resolved all inflammation will resolve.  Then all fibrosis will gradually resolve and the liver could eventually be normal.  We will perform FibroScan of the liver on next clinic visit  The Fibroscan can be repeated annually or as often as clinically indicated to assess for progression or regression of fibrosis.      Counseling for diet and weight loss in patients with confirmed or suspected NAFLD  The patient was counseled regarding diet and exercise to achieve weight loss.  The best diet for patients with fatty liver is the Mediterranean diet  The patient was told not to consume any food products and drinks containing fructose as this enhances hepatic fat synthesis.    Screening for Hepatocellular Carcinoma  HCC screening is not necessary if the patient has no evidence of cirrhosis.    Treatment of other medical problems in patients with chronic liver disease  There are no contraindications for the patient to take most medications that are necessary for treatment of other medical issues.    Counseling for alcohol in patients with chronic liver disease  The patient was counseled regarding alcohol consumption and the effect of alcohol on chronic liver disease.    Vaccinations   The need for vaccination against viral hepatitis A and B will be assessed with serologic and instituted as appropriate.  Routine vaccinations against other bacterial and viral agents can be performed as indicated.  Annual flu vaccination should be administered if indicated.      ALLERGIES  Allergies   Allergen Reactions   ??? Ace Inhibitors Cough   ??? Egg Swelling   ??? Egg Derived Swelling   ??? Iodine Rash   ??? Iodine And Iodide Containing Products Itching and Rash   ??? Nut - Unspecified Swelling   ??? Pravastatin  Myalgia   ??? Shellfish Derived Other  (comments)     WATER POCKETS   ??? Simvastatin Myalgia       MEDICATIONS  Current Outpatient Medications   Medication Sig   ??? dulaglutide (Trulicity) 3 YQ/6.5 mL pnij by SubCUTAneous route.   ??? albuterol (PROVENTIL HFA) 90 mcg/actuation inhaler Proventil HFA 90 mcg/actuation aerosol inhaler   ??? losartan (COZAAR) 25 mg tablet Take 25 mg by mouth.   ??? omeprazole (PRILOSEC) 20 mg capsule Take 20 mg by mouth.   ??? metoprolol succinate (TOPROL-XL) 50 mg XL tablet Take 50 mg by mouth.   ??? clotrimazole-betamethasone (LOTRISONE) topical cream Apply small amount to external vulva bid prn   ??? acetaminophen (TYLENOL) 650 mg TbER 1,300 mg as needed.   ??? azithromycin (ZITHROMAX) 250 mg tablet azithromycin 250 mg tablet   ??? cetirizine (ZYRTEC) 10 mg tablet as needed.   ??? diclofenac (VOLTAREN) 1 % gel 2 g.   ??? exemestane (AROMASIN) 25 mg tablet Take 25 mg by mouth.   ??? fluticasone (FLONASE ALLERGY RELIEF) 50 mcg/actuation nasal spray Take 2 sprays every day by nasal route.   ??? fluocinoNIDE (LIDEX) 0.05 % topical cream APPLY TO THE AFFECTED AREA(S) BY TOPICAL ROUTE 2 TIMES PER DAY   ??? glucose blood VI test strips (FREESTYLE LITE STRIPS) strip FreeStyle Lite Strips   ??? gabapentin (NEURONTIN) 100 mg capsule    ??? glucose blood VI test strips (FREESTYLE LITE STRIPS) strip    ??? HYDROcodone-acetaminophen (NORCO) 5-325 mg per tablet hydrocodone 5 mg-acetaminophen 325 mg tablet   ??? insulin aspart U-100 (NOVOLOG FLEXPEN U-100 INSULIN) 100 unit/mL inpn    ??? aspirin 81 mg chewable tablet 81 mg daily.   ??? terconazole (TERAZOL 3) 80 mg vaginal suppository terconazole 80 mg vaginal suppository   ??? terconazole (TERAZOL 7) 0.4 % vaginal cream terconazole 0.4 % vaginal cream   ??? terconazole (TERAZOL 7) 0.4 % vaginal cream INSERT 1 APPLICATORFUL BY VAGINAL ROUTE ONCE DAILY AT BEDTIME FOR 7 DAYS   ??? Insulin Needles, Disposable, (SURE COMFORT PEN NEEDLE) 32 gauge x 5/32" ndle Sure Comfort Pen Needle 32 gauge x 5/32"   ??? Insulin Needles, Disposable, (SURE  COMFORT PEN NEEDLE) 31 gauge x 3/16" ndle Sure Comfort Pen Needle 31 gauge x 3/16"   ??? fluconazole (DIFLUCAN) 150 mg tablet TAKE 1 TABLET BY MOUTH EVERY DAY FOR 1 DAY   ??? ezetimibe (ZETIA) 10 mg tablet Take  by mouth.   ??? gabapentin (NEURONTIN) 300 mg capsule Take 300 mg by mouth three (3) times daily.   ??? metFORMIN (GLUCOPHAGE) 500 mg tablet Take  by mouth two (2) times daily (with meals).   ??? cetirizine (ZYRTEC) 5 mg/5 mL solution Take  by mouth.   ??? montelukast (SINGULAIR) 4 mg Take  by mouth every evening.   ??? nitroglycerin (NITROSTAT SL) by SubLINGual route.   ??? traZODone (DESYREL) 100 mg tablet Take 100 mg by mouth nightly.   ??? cholecalciferol, VITAMIN D3, (VITAMIN D3) 5,000 unit tab tablet Take  by mouth daily.   ??? insulin aspart prot/insuln asp (NOVOLOG MIX 70-30 SC) by SubCUTAneous route.   ??? insulin glargine,hum.rec.anlog (LANTUS SC) by SubCUTAneous route.   ??? exemestane (AROMASIN) 25 mg tablet  (Patient not taking: Reported on 09/26/2019)   ??? exenatide microspheres (BYDUREON) 2 mg/0.65 mL pnij 2 mg by SubCUTAneous route. (Patient not taking: Reported on 09/26/2019)   ??? ezetimibe (ZETIA) 10 mg tablet TAKE 1 TABLET DAILY (Patient not taking: Reported on  09/26/2019)   ??? prasugrel (EFFIENT) 10 mg tablet Take 10 mg by mouth. (Patient not taking: Reported on 09/26/2019)   ??? exemestane (AROMASIN) 25 mg tablet exemestane 25 mg tablet (Patient not taking: Reported on 09/26/2019)   ??? NOVOLOG FLEXPEN U-100 INSULIN 100 unit/mL inpn  (Patient not taking: Reported on 09/26/2019)   ??? phentermine-topiramate (QSYMIA) 7.5-46 mg CM24 Qsymia 7.5 mg-46 mg capsule, extended release (Patient not taking: Reported on 09/26/2019)   ??? metoprolol succinate (TOPROL-XL) 200 mg XL tablet Take 200 mg by mouth daily. (Patient not taking: Reported on 09/26/2019)   ??? valsartan (DIOVAN) 160 mg tablet Take  by mouth daily. (Patient not taking: Reported on 09/26/2019)   ??? prasugrel (EFFIENT) 10 mg tablet Take  by mouth. (Patient not taking: Reported  on 09/26/2019)   ??? pravastatin (PRAVACHOL) 80 mg tablet Take 80 mg by mouth nightly. (Patient not taking: Reported on 09/26/2019)   ??? exenatide microspheres (BYDUREON SC) by SubCUTAneous route. (Patient not taking: Reported on 09/26/2019)     No current facility-administered medications for this visit.       SYSTEM REVIEW NOT RELATED TO LIVER DISEASE OR REVIEWED ABOVE:  Constitution systems: Negative for fever, chills, weight gain, weight loss.   Eyes: Negative for visual changes.  ENT: Negative for sore throat, painful swallowing.   Respiratory: Negative for cough, hemoptysis, SOB.   Cardiology: Negative for chest pain, palpitations.  GI:  Negative for constipation or diarrhea.  GU: Negative for urinary frequency, dysuria, hematuria, nocturia.   Skin: Negative for rash.  Hematology: Negative for easy bruising, blood clots.    Musculo-skelatal: Negative for back pain, muscle pain, weakness.  Neurologic: Negative for headaches, dizziness, vertigo, memory problems not related to HE.  Psychology: Negative for anxiety, depression.       FAMILY HISTORY:  There is no family history of liver disease.   There is no family history of immune disorders.    SOCIAL HISTORY:  The patient is married.    The patient has 4 children, 1 child, 3 grandchildren.    The patient stopped using tobacco products in 1991   The patient does not drink alcohol   The patient is retired.       PHYSICAL EXAMINATION:  Visit Vitals  BP (!) 153/88   Pulse 95   Temp 99 ??F (37.2 ??C) (Tympanic)   Ht '5\' 3"'  (1.6 m)   Wt 180 lb 6 oz (81.8 kg)   SpO2 98%   BMI 31.95 kg/m??       General: No acute distress.   Eyes: Sclera anicteric.   ENT: No oral lesions.  Thyroid normal.  Nodes: No adenopathy.   Skin: No spider angiomata.  No jaundice.  No palmar erythema.  Respiratory: Lungs clear to auscultation.   Cardiovascular: Regular heart rate.  No murmurs.  No JVD.  Abdomen: Soft non-tender, liver size normal to percussion/palpation.  Spleen not palpable. No obvious  ascites.  Extremities: No edema.  No muscle wasting.  No gross arthritic changes.  Neurologic: Alert and oriented.  Cranial nerves grossly intact.  No asterixis.    LABORATORY STUDIES:  Recent liver function panel, CBC with platelet count and BMP are not available.  These studies will be performed  [facility] 09/26/2019   WBC 4.6 - 13.2 K/uL 6.7   ANC 1.8 - 8.0 K/UL 3.2   HGB 12.0 - 16.0 g/dL 14.1   PLT 135 - 420 K/uL 295  AST 10 - 38 U/L 48 (H)   ALT 13 - 56 U/L 41   Alk Phos 45 - 117 U/L 65   Bili, Total 0.2 - 1.0 MG/DL 0.4   Albumin 3.4 - 5.0 g/dL 3.9   BUN 7.0 - 18 MG/DL 15   Creat 0.6 - 1.3 MG/DL 0.92   Na 136 - 145 mmol/L 141   K 3.5 - 5.5 mmol/L 3.7   Cl 100 - 111 mmol/L 105   CO2 21 - 32 mmol/L 29   Glucose 74 - 99 mg/dL 155 (H)       SEROLOGIES:  Not available or performed.  Testing was performed today.  [facility] Latest Ref Rng & Units 09/26/2019   Hep B Surface Ag <1.00 Index <0.10   Hep B Surface Ag Interp NEG   Negative   Hep B Core Ab, Total Negative   Negative   Hep B Surface Ab >10.0 mIU/mL <3.10 (L)   Hep B Surface Ab Interp POS   Negative (A)   Hep C Ab 0.0 - 0.9 s/co ratio <0.1   Ferritin 8 - 388 NG/ML 194   Iron % Saturation 20 - 50 % 23       LIVER HISTOLOGY:  Not available or performed    ENDOSCOPIC PROCEDURES:  Not available or performed    RADIOLOGY:  Not available or performed    OTHER TESTING:  Not available or performed    FOLLOW-UP:  All of the issues listed above in the Assessment and Plan were discussed with the patient.  All questions were answered.  The patient expressed a clear understanding of the above.    [place] in 4 weeks for Fibroscan and to review all data and determine the treatment plan.    Malachi Pro, MD, MPH  Advanced Hepatology  [facility] [address], [place]  [address]  [phone]  [place]

## 2019-09-26 NOTE — Progress Notes (Signed)
[facility]  [facility]  [facility]      Bryson Ha, MD, FACP, [place], [place]    Roderic Scarce, MD, MPH      Jones Bales, PA-C    Zola Button, ACNP-BC     April S Ashworth, AGPCNP-BC   Mathis Fare, FNP-C    Maryelizabeth Rowan, AGPCNP-BC       [facility]    at [facility]    [address]    [place], [place]  [address]    [phone]    FAX: [phone]   [facility]    at [facility]    [address], [address]    [place], [place]  [address]    [phone]    FAX: [phone]           Patient Care Team:  Ardelle Lesches, MD as PCP - General (Internal Medicine)      Problem List  Date Reviewed: 01/14/17        Codes Class Noted    Diabetes mellitus (HCC) ICD-10-CM: E11.9  ICD-9-CM: 250.00  Unknown        Arthritis ICD-10-CM: M19.90  ICD-9-CM: 716.90  Unknown        Asthma ICD-10-CM: J45.909  ICD-9-CM: 493.90  Unknown        Hypercholesteremia ICD-10-CM: E78.00  ICD-9-CM: 272.0  Unknown        GERD (gastroesophageal reflux disease) ICD-10-CM: K21.9  ICD-9-CM: 530.81  Unknown        Obese ICD-10-CM: E66.9  ICD-9-CM: 278.00  Unknown        History of CVA in adulthood ICD-10-CM: Z86.73  ICD-9-CM: V12.54  Unknown        History of myocardial infarction ICD-10-CM: I25.2  ICD-9-CM: 412  Unknown    Overview Signed 09/16/2019  8:33 AM by Mignon Pine, PA     3 seperate episodes / 3 stents in place             Smoking history ICD-10-CM: Z87.891  ICD-9-CM: V15.82  Unknown    Overview Signed 09/16/2019  8:38 AM by Mignon Pine, PA     quit 1991             Hypertension ICD-10-CM: I10  ICD-9-CM: 401.9  Unknown        Heart attack (HCC) ICD-10-CM: I21.9  ICD-9-CM: 410.90  Unknown        Diabetes (HCC) ICD-10-CM: E11.9  ICD-9-CM: 250.00  Unknown        Status post gastric banding ICD-10-CM: Z98.84  ICD-9-CM: V45.86  2010    Overview Signed 09/16/2019   8:31 AM by Mignon Pine, PA     tom clark                     The clinicians listed above have   asked me to see Shannon Zimmerman in consultation regarding   suspected   fatty liver disease and its management.      All medical records sent by the referring physicians were reviewed   No medical records were available for review when the patient was here for the appointment.      The patient is a 64 y.o.     Black   female   who was found to have fatty liviver during  obesity surgery in 2010       The most recent laboratory studies   are not available.          Serologic evaluation for markers of chronic liver disease   has either not been performed or the results are not available.    was negative.  was negative for   was positive for   HCV,   HBV,   ANA,   ASMA,   AMA,   ANCA,   a low   ceruloplasmin,   alpha-1-antitrypsin,   an elevation in   ferritin,    FE saturation.      CT scan     1. ??Colonic diverticulosis without evidence of acute diverticulitis.     2. ??Hepatic steatosis.     3. ??Cholecystectomy.     4. ??Left renal cyst.     5. ??Coronary artery calcifications.      Imaging of the liver   was either not performed or the results are not available to me.    was not performed.      The most recent imaging of the liver was   Ultrasound   CT   MRI   performed in ***/***.    Results   suggest   are not available at this time.  The patient believes this demonstrated   that the liver is normal.    chronic liver disease.    fatty liver disease.    cirrhosis.    No liver mass lesions noted.      An assessment of liver fibrosis with biopsy or elastography   has not been performed.        The patient    had not started any new medications within 3 months preceding the elevation in liver chemistries.  .      The patient reports lower abdominal pain which is chronic.  has the following symptoms which could be attributed to the liver disorder:    does not have any symptoms which could be attributed to the liver  disorder.  fatigue,   fevers,   chills,   shortness of breath,   chest pain,   pain in the right side over the liver,   diffuse abdominal pain,   nausea,   vomiting,   constipation,   diarrhea,   dry eyes,   dry mouth,   arthralgias,   myalgias,   yellowing of the eyes or skin,   itching,   dark urine,   problems concentrating,   swelling of the abdomen,   swelling of the lower extremities,   hematemesis,   hematochezia.    The patient is not experiencing the following symptoms which are commonly seen in this liver disorder:   fatigue,   fevers,   chills,   shortness of breath,   chest pain,   pain in the right side over the liver,   diffuse abdominal pain,   nausea,   vomiting,   constipation,   diarrhea,   dry eyes,   dry mouth,   arthralgias,   myalgias,   yellowing of the eyes or skin,   itching,   dark urine,   problems concentrating,   swelling of the abdomen,   swelling of the lower extremities,   hematemesis,   hematochezia.    The patient   completes all daily activities without any functional limitations.            ASSESSMENT AND PLAN:  Fatty liver  NAFLD  NASH  Benign steatosis/NAFL  Suspect the patient has fatty liver based upon   The diagnosis is based upon   liver biopsy,   imaging,   Fiboscan CAP score,   features of metabolic syndrome,   serologic studies that are negative for other causes of chronic liver disease,     The histologic severity has not been defined.    A liver biopsy performed in ***/*** shows   Elastography performed in ***/*** suggests   no fibrosis  stage 1 portal fibrosis.    stage 2 septal fibrosis.    Stage 3 bridging fibrosis.    Cirrhosis.      NAFL is a benign form of fatty liver disease and not thought to progress to fibrosis or cirrhosis.    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  There is balloning of hepatocytes but this is mild.  There is inflammation but this is also mild.    If the patient remains at current weight or gains weight NASH will progress to more  advanced fibrosis and cirrhosis.    Liver transaminases are   normal.    elevated.    AST is   normal.    elevated.    ALT is   normal.    elevated.    ALP is   normal.    elevated.    Liver function is   normal.    depressed.    Total bilirubin is   normal.    elevated.    Serum albumin is   normal.    depressed.    INR is   normal.    prolonged.    The platelet count is   normal.    depressed.       Based upon laboratory studies   Fibroscan,   Elastography,   and imaging    the patient   does not appear to have   appears to have   may have   significant liver injury.  advanced liver disease.  cirrhosis.          Will perform   Have performed   laboratory testing to monitor liver function and degree of liver injury.    This included   BMP,   hepatic panel,   CBC with platelet count,   INR.        Serologic testing for causes of chronic liver disease   was ordered.    was negative for     was positive for   HCV  HBV   ANA   ASMA  AMA  Alpha-1-antitrypsin  Ceruloplasmin  an elevation in   Ferritin  FE saturation  Will perform additional serologic tests to screen for other causes of chronic liver disease.      Will perform imaging of the liver with   ultrasound.    triple phase CT scan.    dynamic MRI.    MRI and MRCP because of persistent elevation in ALP and possible bile duct disease.    Only a liver biopsy can confirm if the patient does have fatty liver and differentiate NAFL from NASH.  The treatment is the same; weight reduction.  If the liver biopsy demonstrates NASH the patient could be eligible for enrollment into clinical trials for treatment of NASH.    The need to perform a liver biopsy to help determine the cause and severity of the liver test abnormalities was discussed.  The risks of performing the liver biopsy including  pain, puncture of the lung, gallbladder, intestine or kidney and bleeding were discussed.  The patient has decided to have a liver biopsy.  This will be scheduled.  Will defer liver  biopsy for now.    I have recommended that we proceed with liver biopsy but the patient has decided to defer this for now.  There is no reason to perform liver biopsy at this time.  Liver chemistries will be monitored.      If the liver enzymes remain persistently elevated over the next 1-2 years a liver biopsy should be performed to ensure there is no ongoing chronic liver disease.    The patient had a liver biopsy performed in ***.    Will request that the liver biopsy slides sent be to me for my personal review.        If the patient looses 20% of current body weight, which is *** pounds, down to a weight of of *** pounds, all steatosis will have resolved.    Once all steatosis has resolved all inflammation will resolve.  Then all fibrosis will gradually resolve and the liver could eventually be normal.    There is currently no FDA approved medical treatment for fatty liver, NALFD or NASH.  The only medical treatments for NASH are though clinical trials.    The patient would be a good candidate for enrollment into a clinical trial if found to have NASH.  The patient was offered participation in a clinical trial for treatment of NASH.    The patient is not eligible to enter a clinical trial for treatment of NASH because of ***.  The patient would like to participate in a clinical trial.  The patient has declined to participate in a clinical trial and would like to try to lose weight through changes in diet and life style.    The patient was not eligible to enroll into a clinical trial because ***.  The patient has decided not to enrol into a clinical.   If the patient decides not to or is unable to enrol into a clinical the   The patient will be monitored at periodic intervals.    If weight loss is successful hepatic steatosis will resolve and liver enzymes should return to the normal range.    The Fibroscan can be repeated annually or as often as clinically indicated to assess for progression or regression of  fibrosis.    Since a liver biopsy was not performed it is possible the patient may not have fatty liver or this may not be contributing to the elevation in liver enzymes.    If liver enzymes do not return to normal with weight reduction then additional evaluation including liver biopsy may be necessary to determine if the elevated liver enzymes is due to another etiology.      Counseling for diet and weight loss in patients with confirmed or suspected NAFLD  The patient was counseled regarding diet and exercise to achieve weight loss.  The best diet for patients with fatty liver is one very low in carbohydrates and enriched with protein such as an Atkin's program.      The patient was told not to consume any food products and drinks containing fructose as this enhances hepatic fat synthesis.    There is no medication or vitamin supplements that we advocate for NASH.    Using glitazones in patients without diabetes mellitus has been shown to reduce fat content in the liver but has  no effect on fibrosis and is associated with weight gain.  Vitamin E has also been used but the data is not very good and most experts no longer advocate this.            Screening for Hepatocellular Carcinoma  HCC screening   is not necessary if the patient has no evidence of cirrhosis.  has not been not been performed   since ***/***.  has recently been performed and does not suggest HCC.    The next liver imaging study will be performed in ***/***.  AFP was ordered today and ultrasound will be scheduled.    demonstrates   an elevation in AFP.    a lesion on ultrasound.      Will perform   triple phase CT scan      dynamic MRI   to further characterize the lesion and help determine if this is HCC.  a liver mass in the *** lobe, segment ***   with features that are diagnostic for [facility]   With features that are suggestive of HCC   by dynamic MRI   and  triple phase CT.    Will repeat the imaging study in *** weeks.      Treatment of other  medical problems in patients with chronic liver disease  There are no contraindications for the patient to take most medications that are necessary for treatment of other medical issues.    The patient has cirrhrosis and should avoid taking NSAIDs which are associated with a higher rate of developing AKI.      The patient had HE and should avoid taking Benzodiazapines which could exacerbate HE.    The patient can take   any medications utilized for treatment of DM  statins to treat hypercholesterolemia    The patient has alcohol induced liver disease but has been abstinent from alcohol for greater than 6 months.  Normal doses of acetaminophen, as recommended on the label of the bottle, are not hepatotoxic except in the setting of daily alcohol use, even in patients with cirrhosis and can be utilized for pain.  The patient consumes alcohol on a daily basis or has recently stopped consuming alcohol.  Regular alcohol use increases the risk of toxicity from acetaminophen.  This analgesic should be avoided until the patient has been abstinent from alcohol for 6 months.        Counseling for alcohol in patients with chronic liver disease  The patient was counseled regarding alcohol consumption and the effect of alcohol on chronic liver disease.  The patient has cirrhosis and was advised to be abstinent from all alcohol including non-alcoholic beer which does contain some alcohol.    The patient does not consume any significant amount of alcohol.  The patient has not consumed alcohol since ***.  The patient has continued to consume alcohol   daily.    on rare social occasions.      The patient was reminded that alcohol can cause fatty liver.  Patients who have undergone obesity surgery are at much greater risk to develop alcoholic liver injury.    The patient does not have a chronic liver disease and does not have to be abstinent from alcohol.      The patient consumes too much alcohol and is at risk to develop alcohol  induced liver injury.  It was recommended that all alcohol consumption be stopped and the patient be abstinent from alcohol  for at least *** months.  If the patient cannot stop consuming alcohol then there is an alcohol abuse disorder and the patient should consider entering alcohol counseling and/or attend AA.  The patient has an alcohol abuse disorder and it was suggested that they enter alcohol counseling and/or attend AA.    If the patient cannot stop drinking alcohol they cannot be considered a candidate for liver transplant.  The patient will need to attend alcohol counceling prior to being accepted as a liver transplant candidate.    Vaccinations   Vaccination for viral hepatitis A and B is recommended since the patient has no serologic evidence of previous exposure or vaccination with immunity.  Vaccination for viral hepatitis A and B is not needed.  The patient has serologic evidence of prior exposure or vaccination with immunity.  Vaccination for viral hepatitis A and B has been initiated.    Vaccination for viral hepatitis A and B has been completed.  Serologic studies will be performed to assess response to vaccine.  The need for vaccination against viral hepatitis A and B will be assessed with serologic and instituted as appropriate.  Since the patient does not have a chronic liver disease which can lead to liver injury screening for HAV and HBV is not needed.  Routine vaccinations against other bacterial and viral agents can be performed as indicated.  Annual flu vaccination should be administered if indicated.      ALLERGIES  Allergies   Allergen Reactions   ??? Ace Inhibitors Cough   ??? Egg Swelling   ??? Egg Derived Swelling   ??? Iodine Rash   ??? Iodine And Iodide Containing Products Itching and Rash   ??? Nut - Unspecified Swelling   ??? Pravastatin Myalgia   ??? Shellfish Derived Other (comments)     WATER POCKETS   ??? Simvastatin Myalgia       MEDICATIONS  Current Outpatient Medications   Medication Sig   ???  dulaglutide (Trulicity) 3 mg/0.5 mL pnij by SubCUTAneous route.   ??? albuterol (PROVENTIL HFA) 90 mcg/actuation inhaler Proventil HFA 90 mcg/actuation aerosol inhaler   ??? losartan (COZAAR) 25 mg tablet Take 25 mg by mouth.   ??? omeprazole (PRILOSEC) 20 mg capsule Take 20 mg by mouth.   ??? metoprolol succinate (TOPROL-XL) 50 mg XL tablet Take 50 mg by mouth.   ??? clotrimazole-betamethasone (LOTRISONE) topical cream Apply small amount to external vulva bid prn   ??? acetaminophen (TYLENOL) 650 mg TbER 1,300 mg as needed.   ??? azithromycin (ZITHROMAX) 250 mg tablet azithromycin 250 mg tablet   ??? cetirizine (ZYRTEC) 10 mg tablet as needed.   ??? diclofenac (VOLTAREN) 1 % gel 2 g.   ??? exemestane (AROMASIN) 25 mg tablet Take 25 mg by mouth.   ??? fluticasone (FLONASE ALLERGY RELIEF) 50 mcg/actuation nasal spray Take 2 sprays every day by nasal route.   ??? fluocinoNIDE (LIDEX) 0.05 % topical cream APPLY TO THE AFFECTED AREA(S) BY TOPICAL ROUTE 2 TIMES PER DAY   ??? glucose blood VI test strips (FREESTYLE LITE STRIPS) strip FreeStyle Lite Strips   ??? gabapentin (NEURONTIN) 100 mg capsule    ??? glucose blood VI test strips (FREESTYLE LITE STRIPS) strip    ??? HYDROcodone-acetaminophen (NORCO) 5-325 mg per tablet hydrocodone 5 mg-acetaminophen 325 mg tablet   ??? insulin aspart U-100 (NOVOLOG FLEXPEN U-100 INSULIN) 100 unit/mL inpn    ??? aspirin 81 mg chewable tablet 81 mg daily.   ??? terconazole (TERAZOL 3) 80 mg vaginal suppository terconazole 80 mg vaginal  suppository   ??? terconazole (TERAZOL 7) 0.4 % vaginal cream terconazole 0.4 % vaginal cream   ??? terconazole (TERAZOL 7) 0.4 % vaginal cream INSERT 1 APPLICATORFUL BY VAGINAL ROUTE ONCE DAILY AT BEDTIME FOR 7 DAYS   ??? Insulin Needles, Disposable, (SURE COMFORT PEN NEEDLE) 32 gauge x 5/32" ndle Sure Comfort Pen Needle 32 gauge x 5/32"   ??? Insulin Needles, Disposable, (SURE COMFORT PEN NEEDLE) 31 gauge x 3/16" ndle Sure Comfort Pen Needle 31 gauge x 3/16"   ??? fluconazole (DIFLUCAN) 150 mg tablet  TAKE 1 TABLET BY MOUTH EVERY DAY FOR 1 DAY   ??? ezetimibe (ZETIA) 10 mg tablet Take  by mouth.   ??? gabapentin (NEURONTIN) 300 mg capsule Take 300 mg by mouth three (3) times daily.   ??? metFORMIN (GLUCOPHAGE) 500 mg tablet Take  by mouth two (2) times daily (with meals).   ??? cetirizine (ZYRTEC) 5 mg/5 mL solution Take  by mouth.   ??? montelukast (SINGULAIR) 4 mg Take  by mouth every evening.   ??? nitroglycerin (NITROSTAT SL) by SubLINGual route.   ??? traZODone (DESYREL) 100 mg tablet Take 100 mg by mouth nightly.   ??? cholecalciferol, VITAMIN D3, (VITAMIN D3) 5,000 unit tab tablet Take  by mouth daily.   ??? insulin aspart prot/insuln asp (NOVOLOG MIX 70-30 SC) by SubCUTAneous route.   ??? insulin glargine,hum.rec.anlog (LANTUS SC) by SubCUTAneous route.   ??? exemestane (AROMASIN) 25 mg tablet  (Patient not taking: Reported on 09/26/2019)   ??? exenatide microspheres (BYDUREON) 2 mg/0.65 mL pnij 2 mg by SubCUTAneous route. (Patient not taking: Reported on 09/26/2019)   ??? ezetimibe (ZETIA) 10 mg tablet TAKE 1 TABLET DAILY (Patient not taking: Reported on 09/26/2019)   ??? prasugrel (EFFIENT) 10 mg tablet Take 10 mg by mouth. (Patient not taking: Reported on 09/26/2019)   ??? exemestane (AROMASIN) 25 mg tablet exemestane 25 mg tablet (Patient not taking: Reported on 09/26/2019)   ??? NOVOLOG FLEXPEN U-100 INSULIN 100 unit/mL inpn  (Patient not taking: Reported on 09/26/2019)   ??? phentermine-topiramate (QSYMIA) 7.5-46 mg CM24 Qsymia 7.5 mg-46 mg capsule, extended release (Patient not taking: Reported on 09/26/2019)   ??? metoprolol succinate (TOPROL-XL) 200 mg XL tablet Take 200 mg by mouth daily. (Patient not taking: Reported on 09/26/2019)   ??? valsartan (DIOVAN) 160 mg tablet Take  by mouth daily. (Patient not taking: Reported on 09/26/2019)   ??? prasugrel (EFFIENT) 10 mg tablet Take  by mouth. (Patient not taking: Reported on 09/26/2019)   ??? pravastatin (PRAVACHOL) 80 mg tablet Take 80 mg by mouth nightly. (Patient not taking: Reported on  09/26/2019)   ??? exenatide microspheres (BYDUREON SC) by SubCUTAneous route. (Patient not taking: Reported on 09/26/2019)     No current facility-administered medications for this visit.       SYSTEM REVIEW NOT RELATED TO LIVER DISEASE OR REVIEWED ABOVE:  Constitution systems: Negative for fever, chills, weight gain, weight loss.   Eyes: Negative for visual changes.  ENT: Negative for sore throat, painful swallowing.   Respiratory: Negative for cough, hemoptysis, SOB.   Cardiology: Negative for chest pain, palpitations.  GI:  Negative for constipation or diarrhea.  GU: Negative for urinary frequency, dysuria, hematuria, nocturia.   Skin: Negative for rash.  Hematology: Negative for easy bruising, blood clots.    Musculo-skelatal: Negative for back pain, muscle pain, weakness.  Neurologic: Negative for headaches, dizziness, vertigo, memory problems not related to HE.  Psychology: Negative for anxiety, depression.  FAMILY HISTORY:       There is no family history of liver disease.   There is no family history of immune disorders.          SOCIAL HISTORY:  The patient   is married.      The patient has 4 children.     1 child.    3 grandchildren.      The patient     stopped using tobacco products in 1991     The patient does not drink alcohol         The patient is retired.         PHYSICAL EXAMINATION:  Visit Vitals  BP (!) 153/88   Pulse 95   Temp 99 ??F (37.2 ??C) (Tympanic)   Ht 5\' 3"  (1.6 m)   Wt 180 lb 6 oz (81.8 kg)   SpO2 98%   BMI 31.95 kg/m??       General: No acute distress.   Eyes: Sclera anicteric.   ENT: No oral lesions.  Thyroid normal.  Nodes: No adenopathy.   Skin: No spider angiomata.  No jaundice.  No palmar erythema.  Respiratory: Lungs clear to auscultation.   Cardiovascular: Regular heart rate.  No murmurs.  No JVD.  Abdomen: Soft non-tender, liver size normal to percussion/palpation.  Spleen not palpable. No obvious ascites.  Extremities: No edema.  No muscle wasting.  No gross arthritic changes.   Neurologic: Alert and oriented.  Cranial nerves grossly intact.  No asterixis.      LABORATORY STUDIES:  Recent liver function panel, CBC with platelet count and BMP are not available.  These studies will be performed.    SEROLOGIES:  Not available or performed.  Testing was performed today.    LIVER HISTOLOGY:  Not available or performed    ENDOSCOPIC PROCEDURES:  Not available or performed    RADIOLOGY:  Not available or performed    OTHER TESTING:  Not available or performed    FOLLOW-UP:  All of the issues listed above in the Assessment and Plan were discussed with the patient.  All questions were answered.  The patient expressed a clear understanding of the above.    [facility] ***   weeks   months   for Fibroscan   for elastography   2 weeks after liver biopsy.  which should be 1-2 weeks after the next imaging study.    and to initiate HCV treatment.  to assess for the effects of diet changes and weight loss.  to assess the effects and tolerability of ***.  for screening and enrollment into a clinical trial for treatment of ***.  The patient was given a follow-up appointment for *** months in case she decides not to enter or is excluded from entering the clinical trial.  for routine monitoring.  to review all data and determine the treatment plan.

## 2019-09-27 LAB — HEPATITIS B CORE ANTIBODY, TOTAL: Hep B Core Total Ab: NEGATIVE

## 2019-09-27 LAB — HCV AB W/RFLX TO NAA
HCV AB, 144035: <0.1 s/co ratio (ref 0.0–0.9)
HCV Ab: <0.1 s/co ratio (ref 0.0–0.9)

## 2019-09-27 LAB — IRON AND TIBC
Iron Saturation: 23 % (ref 20–50)
Iron: 79 ug/dL (ref 50–175)
TIBC: 347 ug/dL (ref 250–450)

## 2019-09-27 LAB — HCV INTERPRETATION

## 2019-09-27 LAB — HEPATITIS B SURFACE ANTIGEN: Hepatitis B Surface Ag: <0.1 Index (ref <1.00)

## 2019-09-27 LAB — HEPATITIS B SURFACE ANTIBODY
Hep B S Ab Interp: NEGATIVE — AB
Hep B S Ab: <3.1 m[IU]/mL — ABNORMAL LOW (ref >10.0)

## 2019-09-27 LAB — FERRITIN
Ferritin: 194 NG/ML (ref 8–388)
Ferritin: 194 NG/ML (ref 8–388)

## 2019-09-27 LAB — HEP B SURFACE AB
Hep B surface Ab Interp.: NEGATIVE — AB
Hepatitis B surface Ab: <3.1 m[IU]/mL — ABNORMAL LOW (ref >10.0)

## 2019-09-27 LAB — HEP B SURFACE AG
Hep B surface Ag Interp.: NEGATIVE
Hepatitis B surface Ag: <0.1 Index (ref <1.00)

## 2019-09-27 LAB — IRON PROFILE
Iron % saturation: 23 % (ref 20–50)
Iron: 79 ug/dL (ref 50–175)
TIBC: 347 ug/dL (ref 250–450)

## 2019-09-27 LAB — HEPATITIS B CORE AB, TOTAL: Hep B Core Ab, total: NEGATIVE

## 2019-10-15 ENCOUNTER — Encounter

## 2019-10-16 ENCOUNTER — Ambulatory Visit: Admit: 2019-10-17 | Discharge: 2019-10-17 | Payer: MEDICARE | Attending: Specialist | Primary: Internal Medicine

## 2019-10-16 ENCOUNTER — Inpatient Hospital Stay: Payer: MEDICARE

## 2019-10-16 ENCOUNTER — Ambulatory Visit: Admit: 2019-10-16 | Payer: MEDICARE | Primary: Internal Medicine

## 2019-10-16 MED ORDER — LIDOCAINE HCL 1 % (10 MG/ML) IJ SOLN
10 mg/mL (1 %) | INTRAMUSCULAR | Status: DC | PRN
Start: 2019-10-16 — End: 2019-10-16
  Administered 2019-10-16: 19:00:00 via SUBCUTANEOUS

## 2019-10-16 MED ORDER — BARIUM SULFATE 60 % (W/V) ORAL SUSP
60 % (w/v) | ORAL | Status: DC | PRN
Start: 2019-10-16 — End: 2019-10-16
  Administered 2019-10-16: 19:00:00 via ORAL

## 2019-10-16 NOTE — Procedures (Signed)
Lap Band Encounter (fluroscopy clinic)    MAELEY MATTON is gastric banding patient who had her procedure on  .  her weight today is 81.1 kg (178 lb 14.4 oz), which correlates to  % EBW loss.  she is here today for Lap Band Adjustment / Fill with Fluoroscopy Guidance.  she notes the following issues related to the banding procedure; - here for first adjustment, seeking to have her tom clark band removed.      Surgery related complications; surgery by tom clark    Visit Vitals  BP (!) 154/94   Pulse 100   Temp 97.6 ??F (36.4 ??C)   Resp 20   Ht 5\' 3"  (1.6 m)   Wt 81.1 kg (178 lb 14.4 oz)   SpO2 100%   BMI 31.69 kg/m??       Past Medical History:   Diagnosis Date   ??? Arthritis    ??? Asthma    ??? Diabetes mellitus (HCC)    ??? GERD (gastroesophageal reflux disease)    ??? History of CVA in adulthood     "mild"   ??? History of myocardial infarction     3 seperate episodes / 3 stents in place    ??? Hypercholesteremia    ??? Hypertension    ??? Obese    ??? Smoking history     quit 1991   ??? Status post gastric banding 2010    tom clark     Past Surgical History:   Procedure Laterality Date   ??? CARDIAC CATHETERIZATION      stents X 3   ??? HX ABDOMINOPLASTY     ??? HX CHOLECYSTECTOMY  1991   ??? HX GI  2010    gastric band / tom clark     Current Facility-Administered Medications   Medication Dose Route Frequency Provider Last Rate Last Admin   ??? barium sulfate (EZ PAQUE) 60 % (w/v) contrast susp    PRN , MD   100 mL at 10/16/19 1450          Review of Symptoms:     General - No history or complaints of unexpected fever or chills  Cardiac - No history or complaints of chest pain, palpitations, or shortness of breath  Pulmonary - No history or complaints of shortness of breath or productive cough  Gastrointestinal - as noted above        Physical Exam:    General:  alert, cooperative, no distress, appears stated age   Abdomen:   abdomen is soft without significant tenderness, masses, organomegaly or guarding; port in place    Incisions: healing well, no significant drainage       Assessment:     1. History of Morbid obesity, status post gastric banding, given the fluro findings we will proceed with the following adjustment.      Plan:     Previous Fill Volume:     Removed:      Total fill volume after today's adjustment:    Added:           all fluid removed from band (1.5cc).  Negative pressure held on band    Pt understands need to avoid having band removed given age and health issues.     Follow-up in PRN

## 2019-11-07 ENCOUNTER — Inpatient Hospital Stay: Admit: 2019-11-07 | Payer: MEDICARE | Primary: Internal Medicine

## 2019-11-07 ENCOUNTER — Ambulatory Visit: Admit: 2019-11-07 | Discharge: 2019-11-07 | Payer: MEDICARE | Attending: Hospitalist | Primary: Internal Medicine

## 2019-11-07 ENCOUNTER — Ambulatory Visit: Attending: Hospitalist | Primary: Internal Medicine

## 2019-11-07 DIAGNOSIS — K760 Fatty (change of) liver, not elsewhere classified: Secondary | ICD-10-CM

## 2019-11-07 NOTE — Progress Notes (Signed)
[facility]      Thedore Mins, MD, FACP, [place], [place]    Malachi Pro, MD, MPH      Arlyss Queen, PA-C    Genella Rife, ACNP-BC     April S Ashworth, AGPCNP-BC   Demetrios Isaacs, FNP-C    Marlyce Huge, AGPCNP-BC       [place]    at [facility]    [address] [place]  [address]    [phone]    FAX: [place]    at [facility]    [address] [place]  [address]    [phone]    FAX: [phone]     Patient Care Team:  Hewitt Shorts, MD as PCP - General (Internal Medicine)    Problem List  Date Reviewed: 12/22/19        Codes Class Noted    NAFLD (nonalcoholic fatty liver disease) ICD-10-CM: K76.0  ICD-9-CM: 571.8  12/22/2019        CAD (coronary artery disease) ICD-10-CM: I25.10  ICD-9-CM: 414.00  09/26/2019        Diabetes mellitus ([place]) ICD-10-CM: E11.9  ICD-9-CM: 250.00  Unknown        Arthritis ICD-10-CM: M19.90  ICD-9-CM: 716.90  Unknown        Asthma ICD-10-CM: J45.909  ICD-9-CM: 493.90  Unknown        Hypercholesteremia ICD-10-CM: E78.00  ICD-9-CM: 272.0  Unknown        GERD (gastroesophageal reflux disease) ICD-10-CM: K21.9  ICD-9-CM: 530.81  Unknown        Obese ICD-10-CM: E66.9  ICD-9-CM: 278.00  Unknown        History of CVA in adulthood ICD-10-CM: Z86.73  ICD-9-CM: V12.54  Unknown        History of myocardial infarction ICD-10-CM: I25.2  ICD-9-CM: 284  Unknown    Overview Signed 09/16/2019  8:33 AM by Noland Fordyce, PA     3 seperate episodes / 3 stents in place             Smoking history ICD-10-CM: Z87.891  ICD-9-CM: V15.82  Unknown    Overview Signed 09/16/2019  8:38 AM by Noland Fordyce, PA     quit 1991             Hypertension ICD-10-CM: I10  ICD-9-CM: 401.9  Unknown        Heart attack ([place]) ICD-10-CM: I21.9  ICD-9-CM: 410.90  Unknown         Diabetes ([place]) ICD-10-CM: E11.9  ICD-9-CM: 250.00  Unknown        Status post gastric banding ICD-10-CM: Z98.84  ICD-9-CM: V45.86  2010    Overview Signed 09/16/2019  8:31 AM by Noland Fordyce, PA     tom clark                  SATIA WINGER returns to the [facility] for follow-up and management of fatty liver disease     No medical records were available for review when the patient was here for the appointment.      The patient is a 64 y.o. Black female who was found to have fatty liver during obesity surgery in 2010  The most recent laboratory studies showed elevated AST, ALT normal, alkaline phosphatase normal, liver function is normal and the platelet count is normal      FibroScan performed at [facility] [place]. EkPa was 18.  IQR/med 18%.  CAP 355. The results suggested a fibrosis level of F4  The CAP score suggests there is hepatic steatosis.        Cross sectional imaging of the liver performed with CT scan demonstrates??hepatic steatosis,??cholecystectomy,??left renal cyst.     An assessment of liver fibrosis with biopsy or elastography has not been performed.      The patient  had not started any new medications within 3 months preceding the elevation in liver chemistries.    The patient reports lower abdominal pain which she describes as chronic.    The patient is not experiencing the following symptoms which are commonly seen in this liver disorder: fatigue, pain in the right side over the liver,   yellowing of the eyes or skin, problems concentrating, swelling of the abdomen, swelling of the lower extremities, hematemesis, hematochezia.    The patient completes all daily activities without any functional limitations.      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  ALT is normal. Liver function is normal. The platelet count is normal.    Serologic markers were all negative for common causes of chronic liver disease       If the patient looses 20% of current body weight, which is 34 pounds, down to a weight of 144 pounds, all steatosis will have resolved.    Once all steatosis has resolved all inflammation will resolve.  Then all fibrosis will gradually resolve and the liver could eventually be normal.    Fibroscan can be repeated annually or as often as clinically indicated to assess for progression or regression of fibrosis.    Counseling for diet and weight loss in patients with confirmed or suspected NAFLD  The patient was counseled regarding diet and exercise to achieve weight loss.  The best diet for patients with fatty liver is the Mediterranean diet  The patient was told not to consume any food products and drinks containing fructose as this enhances hepatic fat synthesis.    Screening for Hepatocellular Carcinoma  HCC screening is not necessary if the patient has no evidence of cirrhosis.    Treatment of other medical problems in patients with chronic liver disease  There are no contraindications for the patient to take most medications that are necessary for treatment of other medical issues.    Counseling for alcohol in patients with chronic liver disease  The patient was counseled regarding alcohol consumption and the effect of alcohol on chronic liver disease.    Vaccinations   The need for vaccination against viral hepatitis A and B will be assessed with serologic and instituted as appropriate.  Routine vaccinations against other bacterial and viral agents can be performed as indicated.  Annual flu vaccination should be administered if indicated.      ALLERGIES  Allergies   Allergen Reactions   ??? Ace Inhibitors Cough   ??? Egg Swelling   ??? Egg Derived Swelling   ??? Iodine Rash   ??? Iodine And Iodide  Containing Products Itching and Rash    ??? Nut - Unspecified Swelling   ??? Pravastatin Myalgia   ??? Shellfish Derived Other (comments)     WATER POCKETS   ??? Simvastatin Myalgia       MEDICATIONS  Current Outpatient Medications   Medication Sig   ??? dulaglutide (Trulicity) 3 EX/5.2 mL pnij by SubCUTAneous route.   ??? albuterol (PROVENTIL HFA) 90 mcg/actuation inhaler Proventil HFA 90 mcg/actuation aerosol inhaler   ??? losartan (COZAAR) 25 mg tablet Take 25 mg by mouth.   ??? omeprazole (PRILOSEC) 20 mg capsule Take 20 mg by mouth.   ??? exemestane (AROMASIN) 25 mg tablet    ??? exenatide microspheres (BYDUREON) 2 mg/0.65 mL pnij 2 mg by SubCUTAneous route.   ??? ezetimibe (ZETIA) 10 mg tablet TAKE 1 TABLET DAILY   ??? metoprolol succinate (TOPROL-XL) 50 mg XL tablet Take 50 mg by mouth.   ??? prasugrel (EFFIENT) 10 mg tablet Take 10 mg by mouth.   ??? clotrimazole-betamethasone (LOTRISONE) topical cream Apply small amount to external vulva bid prn   ??? acetaminophen (TYLENOL) 650 mg TbER 1,300 mg as needed.   ??? azithromycin (ZITHROMAX) 250 mg tablet azithromycin 250 mg tablet   ??? cetirizine (ZYRTEC) 10 mg tablet as needed.   ??? diclofenac (VOLTAREN) 1 % gel 2 g.   ??? exemestane (AROMASIN) 25 mg tablet Take 25 mg by mouth.   ??? exemestane (AROMASIN) 25 mg tablet exemestane 25 mg tablet   ??? fluticasone (FLONASE ALLERGY RELIEF) 50 mcg/actuation nasal spray Take 2 sprays every day by nasal route.   ??? fluocinoNIDE (LIDEX) 0.05 % topical cream APPLY TO THE AFFECTED AREA(S) BY TOPICAL ROUTE 2 TIMES PER DAY   ??? glucose blood VI test strips (FREESTYLE LITE STRIPS) strip FreeStyle Lite Strips   ??? gabapentin (NEURONTIN) 100 mg capsule    ??? glucose blood VI test strips (FREESTYLE LITE STRIPS) strip    ??? HYDROcodone-acetaminophen (NORCO) 5-325 mg per tablet hydrocodone 5 mg-acetaminophen 325 mg tablet   ??? NOVOLOG FLEXPEN U-100 INSULIN 100 unit/mL inpn    ??? insulin aspart U-100 (NOVOLOG FLEXPEN U-100 INSULIN) 100 unit/mL inpn    ??? aspirin 81 mg chewable tablet 81 mg daily.   ??? terconazole  (TERAZOL 3) 80 mg vaginal suppository terconazole 80 mg vaginal suppository   ??? terconazole (TERAZOL 7) 0.4 % vaginal cream terconazole 0.4 % vaginal cream   ??? terconazole (TERAZOL 7) 0.4 % vaginal cream INSERT 1 APPLICATORFUL BY VAGINAL ROUTE ONCE DAILY AT BEDTIME FOR 7 DAYS   ??? Insulin Needles, Disposable, (SURE COMFORT PEN NEEDLE) 32 gauge x 5/32" ndle Sure Comfort Pen Needle 32 gauge x 5/32"   ??? Insulin Needles, Disposable, (SURE COMFORT PEN NEEDLE) 31 gauge x 3/16" ndle Sure Comfort Pen Needle 31 gauge x 3/16"   ??? phentermine-topiramate (QSYMIA) 7.5-46 mg CM24 Qsymia 7.5 mg-46 mg capsule, extended release   ??? fluconazole (DIFLUCAN) 150 mg tablet TAKE 1 TABLET BY MOUTH EVERY DAY FOR 1 DAY   ??? metoprolol succinate (TOPROL-XL) 200 mg XL tablet Take 200 mg by mouth daily.   ??? valsartan (DIOVAN) 160 mg tablet Take  by mouth daily.   ??? ezetimibe (ZETIA) 10 mg tablet Take  by mouth.   ??? prasugrel (EFFIENT) 10 mg tablet Take  by mouth.   ??? gabapentin (NEURONTIN) 300 mg capsule Take 300 mg by mouth three (3) times daily.   ??? metFORMIN (GLUCOPHAGE) 500 mg tablet Take  by mouth two (2) times daily (with  meals).   ??? cetirizine (ZYRTEC) 5 mg/5 mL solution Take  by mouth.   ??? pravastatin (PRAVACHOL) 80 mg tablet Take 80 mg by mouth nightly.   ??? montelukast (SINGULAIR) 4 mg Take  by mouth every evening.   ??? nitroglycerin (NITROSTAT SL) by SubLINGual route.   ??? traZODone (DESYREL) 100 mg tablet Take 100 mg by mouth nightly.   ??? cholecalciferol, VITAMIN D3, (VITAMIN D3) 5,000 unit tab tablet Take  by mouth daily.   ??? insulin aspart prot/insuln asp (NOVOLOG MIX 70-30 SC) by SubCUTAneous route.   ??? insulin glargine,hum.rec.anlog (LANTUS SC) by SubCUTAneous route.   ??? exenatide microspheres (BYDUREON SC) by SubCUTAneous route.     No current facility-administered medications for this visit.       SYSTEM REVIEW NOT RELATED TO LIVER DISEASE OR REVIEWED ABOVE:  Constitution systems: Negative for fever, chills, weight gain, weight  loss.   Eyes: Negative for visual changes.  ENT: Negative for sore throat, painful swallowing.   Respiratory: Negative for cough, hemoptysis, SOB.   Cardiology: Negative for chest pain, palpitations.  GI:  Negative for constipation or diarrhea.  GU: Negative for urinary frequency, dysuria, hematuria, nocturia.   Skin: Negative for rash.  Hematology: Negative for easy bruising, blood clots.    Musculo-skelatal: Negative for back pain, muscle pain, weakness.  Neurologic: Negative for headaches, dizziness, vertigo, memory problems not related to HE.  Psychology: Negative for anxiety, depression.       FAMILY HISTORY:  There is no family history of liver disease.   There is no family history of immune disorders.    SOCIAL HISTORY:  The patient is married.    The patient has 4 children, 1 child, 3 grandchildren.    The patient stopped using tobacco products in 1991   The patient does not drink alcohol   The patient is retired.       PHYSICAL EXAMINATION:  Visit Vitals  BP (!) 145/79   Pulse 80   Temp 98.3 ??F (36.8 ??C) (Tympanic)   Wt 177 lb 4 oz (80.4 kg)   SpO2 98%   BMI 31.40 kg/m??       General: No acute distress.   Eyes: Sclera anicteric.   ENT: No oral lesions.  Thyroid normal.  Nodes: No adenopathy.   Skin: No spider angiomata.  No jaundice.  No palmar erythema.  Respiratory: Lungs clear to auscultation.   Cardiovascular: Regular heart rate.  No murmurs.  No JVD.  Abdomen: Soft non-tender, liver size normal to percussion/palpation.  Spleen not palpable. No obvious ascites.  Extremities: No edema.  No muscle wasting.  No gross arthritic changes.  Neurologic: Alert and oriented.  Cranial nerves grossly intact.  No asterixis.    LABORATORY STUDIES:  Recent liver function panel, CBC with platelet count and BMP are not available.  These studies will be performed  [facility] Units 09/26/2019   WBC 4.6 - 13.2 K/uL 6.7   ANC 1.8 - 8.0 K/UL 3.2   HGB 12.0 - 16.0 g/dL 14.1   PLT 135 - 420  K/uL 295   AST 10 - 38 U/L 48 (H)   ALT 13 - 56 U/L 41   Alk Phos 45 - 117 U/L 65   Bili, Total 0.2 - 1.0 MG/DL 0.4   Albumin 3.4 - 5.0 g/dL 3.9   BUN 7.0 - 18 MG/DL 15   Creat 0.6 - 1.3 MG/DL 0.92   Na 136 - 145  mmol/L 141   K 3.5 - 5.5 mmol/L 3.7   Cl 100 - 111 mmol/L 105   CO2 21 - 32 mmol/L 29   Glucose 74 - 99 mg/dL 155 (H)       SEROLOGIES:  Not available or performed.  Testing was performed today.  [facility] Latest Ref Rng & Units 09/26/2019   Hep B Surface Ag <1.00 Index <0.10   Hep B Surface Ag Interp NEG   Negative   Hep B Core Ab, Total Negative   Negative   Hep B Surface Ab >10.0 mIU/mL <3.10 (L)   Hep B Surface Ab Interp POS   Negative (A)   Hep C Ab 0.0 - 0.9 s/co ratio <0.1   Ferritin 8 - 388 NG/ML 194   Iron % Saturation 20 - 50 % 23       LIVER HISTOLOGY:  11/08/19: FibroScan performed at [facility] [place]. EkPa was 18.  IQR/med 18%.  CAP 355. The results suggested a fibrosis level of F4  The CAP score suggests there is hepatic steatosis.        ENDOSCOPIC PROCEDURES:  Not available or performed    RADIOLOGY:  11/07/19: liver US: The liver parenchyma demonstrates diffusely increased and heterogeneous echogenicity, compatible with diffuse fatty infiltration. Ill-defined areas of decreased echogenicity in the liver likely reflect focal fatty sparing. No  definite focal mass lesion is  identified; however, the sensitivity ofultrasound for detection of focal  liver masses in the setting of a fatty liver is very limited.        OTHER TESTING:  Not available or performed    FOLLOW-UP:  All of the issues listed above in the Assessment and Plan were discussed with the patient.  All questions were answered.  The patient expressed a clear understanding of the above.    [place] in 4 weeks for Fibroscan and to review all data and determine the treatment plan.    Malachi Pro, MD, MPH  Advanced Hepatology  [facility] [place], [place]  [address]  [phone]  [place]

## 2019-11-21 ENCOUNTER — Other Ambulatory Visit: Payer: Self-pay | Admitting: Orthopedic Surgery

## 2019-11-21 ENCOUNTER — Other Ambulatory Visit (HOSPITAL_COMMUNITY): Payer: Self-pay | Admitting: Orthopedic Surgery

## 2019-11-21 DIAGNOSIS — Z96653 Presence of artificial knee joint, bilateral: Secondary | ICD-10-CM

## 2019-11-27 ENCOUNTER — Ambulatory Visit (HOSPITAL_COMMUNITY)
Admission: RE | Admit: 2019-11-27 | Discharge: 2019-11-27 | Disposition: A | Discharge status: Home / Self Care | Payer: Medicare Other | Source: Ambulatory Visit | Attending: Orthopedic Surgery | Admitting: Orthopedic Surgery

## 2019-11-27 ENCOUNTER — Other Ambulatory Visit: Payer: Self-pay

## 2019-11-27 ENCOUNTER — Encounter (HOSPITAL_COMMUNITY)
Admission: RE | Admit: 2019-11-27 | Discharge: 2019-11-27 | Disposition: A | Discharge status: Home / Self Care | Payer: Medicare Other | Source: Ambulatory Visit | Attending: Orthopedic Surgery | Admitting: Orthopedic Surgery

## 2019-11-27 DIAGNOSIS — Z96653 Presence of artificial knee joint, bilateral: Secondary | ICD-10-CM | POA: Insufficient documentation

## 2019-11-27 MED ORDER — TECHNETIUM TC 99M MEDRONATE IV KIT
20.0000 | PACK | Freq: Once | INTRAVENOUS | Status: AC | PRN
  Administered 2019-11-27: 20 via INTRAVENOUS

## 2019-12-10 DIAGNOSIS — K760 Fatty (change of) liver, not elsewhere classified: Secondary | ICD-10-CM

## 2020-01-06 ENCOUNTER — Other Ambulatory Visit: Payer: Medicare Other

## 2020-01-06 ENCOUNTER — Ambulatory Visit: Payer: Medicare Other

## 2020-02-10 NOTE — Progress Notes (Deleted)
Cardiology Office Note  Date:  02/10/2020   ID:  [name], DOB Dec 20, 1955, MRN [id number]  PCP:  Georgann Housekeeper, MD  Cardiologist:  Dr. Katrinka Blazing  _____________  Dyspnea  _____________   History of Present Illness: [name] is a 64 y.o. female with pmh of nonobstructive CAD, recurrent chest pain, prolonged QTc (felt to be 2/2 to hypokalemia) who is being seen for dyspnea. Cath in 11/2016 showed mild to mod nonobstructuve CAD with normal LVEF. Medical management was recommended. Echo at that time showed LVEF 60-65%, mild MR, mid dilated LA. She had a prior 30 day heart monitor 7/2018that was unremarkable.   She was last seen 05/30/2017 for CP that was felt to be MSK. No changes were made  Today, she denies symptoms of palpitations, chest pain, shortness of breath, orthopnea, PND, lower extremity edema, claudication, dizziness, presyncope, syncope, bleeding, or neurologic sequela. The patient is tolerating medications without difficulties and is otherwise without complaint today.   Dyspnea  Recurrent CP with nonobstructive CAD - per cath in 2018, also showed normal LVEF  Prolonged QTc - felt to be 2/2 to hypokalemia  HLD - managed by PCP _____________   Past Medical History:  Diagnosis Date  . Anemia   . Anxiety   . Arthritis    "all over" (11/16/2016)  . Chronic lower back pain   . Chronic pain syndrome   . Chronic pain syndrome    "all over" (11/16/2016)  . CKD (chronic kidney disease), stage III (HCC)   . Daily headache   . Depression   . Fibromyalgia   . GERD (gastroesophageal reflux disease)   . High cholesterol   . History of blood transfusion    "related to hand laceration"  . History of hiatal hernia   . History of stomach ulcers    "bled when I was younger" (11/16/2016)  . Hypertension    hx; "they took me off my RX" (11/16/2016)  . Migraine 1970s  . Phlebitis of leg, right    "years ago" (11/16/2016)   Past Surgical History:  Procedure  Laterality Date  . ABDOMINAL HYSTERECTOMY  1993   w/left ovary  . ANKLE FRACTURE SURGERY Bilateral   . APPENDECTOMY  1977  . BACK SURGERY    . CARDIAC CATHETERIZATION  2005   "negative"  . CHOLECYSTECTOMY OPEN  1977  . ESOPHAGOGASTRODUODENOSCOPY  2008   dx'd GERD  . FOREARM FRACTURE SURGERY Right   . HIP SURGERY Bilateral    "took out bone spurs"  . JOINT REPLACEMENT    . KNEE ARTHROSCOPY Bilateral    "total of 18 times"  . LACERATION REPAIR Right    hand; "went thru glass door"  . LEFT HEART CATH AND CORONARY ANGIOGRAPHY N/A 11/18/2016   Procedure: Left Heart Cath and Coronary Angiography;  Surgeon: Yvonne Kendall, MD;  Location: [facility] INVASIVE CV LAB;  Service: Cardiovascular;  Laterality: N/A;  . LUMBAR FUSION     "put rods in"  . LUMBAR LAMINECTOMY/DECOMPRESSION MICRODISCECTOMY  05/2006  . OPEN REDUCTION INTERNAL FIXATION (ORIF) DISTAL RADIAL FRACTURE Left 06/18/2018   Procedure: OPEN REDUCTION INTERNAL FIXATION (ORIF) DISTAL RADIAL FRACTURE repair and/or reconstruction;  Surgeon: Bradly Bienenstock, MD;  Location: [facility] OR;  Service: Orthopedics;  Laterality: Left;  . REVISION TOTAL KNEE ARTHROPLASTY Bilateral   . RIGHT OOPHORECTOMY Right   . SHOULDER OPEN ROTATOR CUFF REPAIR Bilateral    left 04/2006  . TONSILLECTOMY    . TOTAL KNEE ARTHROPLASTY Bilateral  left 1998; right 2004  . TUBAL LIGATION     _____________  Current Outpatient Medications  Medication Sig Dispense Refill  . Acetaminophen-Caffeine (EXCEDRIN ASPIRIN FREE PO) Take 2 tablets by mouth 3 (three) times daily as needed (pain).     Marland Kitchen ALPRAZolam (XANAX) 1 MG tablet Take 0.5 mg by mouth 3 (three) times daily as needed for anxiety.     . Alum & Mag Hydroxide-Simeth (MAALOX ADVANCED PO) Take 15 mLs by mouth 3 (three) times daily as needed (acid reflux).     . cyclobenzaprine (FLEXERIL) 10 MG tablet Take 10 mg by mouth 2 (two) times daily as needed for muscle spasms.    Marland Kitchen FLUoxetine (PROZAC) 20 MG capsule Take 60 mg by  mouth daily.     . furosemide (LASIX) 20 MG tablet Take 10-20 mg by mouth daily as needed for fluid or edema.     . gabapentin (NEURONTIN) 100 MG capsule Take 200 mg by mouth 3 (three) times daily.     Marland Kitchen HYDROcodone-acetaminophen (NORCO/VICODIN) 5-325 MG tablet Take 1 tablet by mouth 3 (three) times daily.     . iron polysaccharides (NIFEREX) 150 MG capsule Take 300 mg by mouth daily.    Marland Kitchen lisinopril (PRINIVIL,ZESTRIL) 5 MG tablet Take 5 mg by mouth daily.    . nitroGLYCERIN (NITROSTAT) 0.4 MG SL tablet Place 1 tablet (0.4 mg total) under the tongue every 5 (five) minutes x 3 doses as needed for chest pain. 25 tablet 2  . omeprazole (PRILOSEC) 20 MG capsule Take 20 mg by mouth 2 (two) times daily.    . potassium chloride (MICRO-K) 10 MEQ CR capsule Take 10 mEq by mouth 2 (two) times daily.    . potassium chloride SA (K-DUR) 20 MEQ tablet Take 1 tablet (20 mEq total) by mouth 2 (two) times daily. Adjust further potassium replacement based on repeat labs with Primary doctor in 7-10 days. 30 tablet 0  . promethazine (PHENERGAN) 25 MG tablet Take 12.5 mg by mouth every 8 (eight) hours as needed for nausea or vomiting.     . ranitidine (ZANTAC) 150 MG tablet Take 150 mg by mouth daily.     . rosuvastatin (CRESTOR) 20 MG tablet TAKE 1 TABLET BY MOUTH ONCE DAILY . APPOINTMENT REQUIRED FOR FUTURE REFILLS 30 tablet 0   No current facility-administered medications for this visit.   _____________   Allergies:   Aspirin and Oxybutynin  _____________   Social History:  The patient  reports that she has never smoked. She has never used smokeless tobacco. She reports that she does not drink alcohol and does not use drugs.  _____________   Family History:  The patient's family history includes CAD (age of onset: 58) in her father; COPD in her mother; Diabetes in her sister; Emphysema in her mother; Iron deficiency in her sister; Lung cancer in her father; Other in her sister.  _____________   ROS:  Please  see the history of present illness.   Positive for ***,   All other systems are reviewed and negative.  _____________   PHYSICAL EXAM: VS:  There were no vitals taken for this visit. , BMI There is no height or weight on file to calculate BMI. GEN: Well nourished, well developed, in no acute distress  HEENT: normal  Neck: no JVD, carotid bruits, or masses Cardiac: RRR; no murmurs, rubs, or gallops. No clubbing, cyanosis, edema.  Radials/DP/PT 2+ and equal bilaterally.  Respiratory:  clear to auscultation bilaterally, normal work of  breathing GI: soft, nontender, nondistended, + BS MS: no deformity or atrophy  Skin: warm and dry, no rash Neuro:  Strength and sensation are intact Psych: euthymic mood, full affect _____________  EKG:   The ekg ordered today shows ***  Recent Labs: 02/25/2019: ALT 17; BUN 12; Creatinine 0.96; Hemoglobin 11.3; Platelet Count 181; Potassium 4.3; Sodium 142  No results found for requested labs within last 8760 hours.  CrCl cannot be calculated (Patient's most recent lab result is older than the maximum 21 days allowed.).  Wt Readings from Last 3 Encounters:  02/25/19 195 lb 6.4 oz (88.6 kg)  11/06/18 203 lb 1.6 oz (92.1 kg)  06/18/18 224 lb (101.6 kg)    _____________   ASSESSMENT AND PLAN:  1.  ***   Disposition:   FU with ***   Signed, Calley Drenning David Stall, NP 02/10/2020 8:30 PM    _____________ [facility] [address] [place] [address]  [phone] (office) [phone] (fax)

## 2020-02-11 ENCOUNTER — Ambulatory Visit: Payer: Medicare Other | Admitting: Cardiology

## 2020-03-09 NOTE — H&P (View-Only) (Signed)
°  ° ° °Cardiology Office Note ° ° °Date:  03/10/2020  ° °ID:  [name], DOB 03/21/1956, MRN [id number] ° °PCP:  Husain, Karrar, MD  °Cardiologist: Dr. Smith, MD  ° °Chief Complaint  °Patient presents with  °• Follow-up  ° ° °History of Present Illness: °[name] is a 64 y.o. female who presents for follow-up, seen for Dr. Smith.  Last seen 05/19/2017. ° °[name] has a history of non-obstructive CAD, anxiety, depression, recurrent chest pain and prolonged QTC which has been felt to be secondary to hypokalemia. ° °She previously underwent LHC 10/29/2003 per Dr. Smith which essentially showed normal coronary arteries with mild plaquing in the mid LAD and distal RCA with a normal LVEF.  Cath performed secondary to chest pain.  In 2018 she was admitted for dizziness and bradycardia with heart rates in the 40s to 50s.  TSH was normal and troponin levels were negative.  Echocardiogram showed normal LVEF with mild MR and moderate LAE.  Stress test showed moderate sized area of decreased myocardial activity in the mid and distal inferior walls with reversibility.  Coronary CTA showed high calcium score of 848 and possible hemodynamically significant stenosis in the proximal, mid LAD. LHC performed 11/18/2016 and she was found to have mild to moderate nonobstructive CAD with 40% mid LAD, 50% diagonal and 25% mid RCA disease.  Medical therapy was recommended.  She was placed on Crestor.  Estrace was discontinued in the setting of CAD.  She was not placed on ASA given peptic ulcer disease.  QTC at the time of admission was 526 ms in the setting of hypokalemia.  QT normalized with resolution of hypokalemia.  She was seen by EP who recommended further monitoring as an outpatient and avoidance of QT prolonging medications therefore Phenergan and Seroquel were discontinued. ° °She was then seen by Dr. Smith 05/19/2017 at which time he wished to see her back on an as-needed basis due to no CAD, normal monitor and  no recurrent issues>> management per PCP going forward. ° °[name] presents with her husband today and reports that over the past 3 to 4 weeks she has been having intermittent chest pressure which worsens on exertion and relieved with rest.  She also has been having issues with shortness of breath and LE edema.  She states that she was previously walking 3 to 4 miles per day with her dog and can no longer do these activities due to overwhelming fatigue, shortness of breath and chest pain.  She is also been having issues with labile BPs despite being only on lisinopril 2.5 mg.  Both husband and wife state that they do not eat takeout and cook all her meals at home.  They use [employer] for seasoning therefore very unlikely diet related.  She has been seen several times for PCP with no clear etiology.  Given this, he has referred her back to cardiology for further work-up given nonobstructive CAD.   ° °Past Medical History:  °Diagnosis Date  °• Anemia   °• Anxiety   °• Arthritis   ° "all over" (11/16/2016)  °• Chronic lower back pain   °• Chronic pain syndrome   °• Chronic pain syndrome   ° "all over" (11/16/2016)  °• CKD (chronic kidney disease), stage III (HCC)   °• Daily headache   °• Depression   °• Fibromyalgia   °• GERD (gastroesophageal reflux disease)   °• High cholesterol   °• History of blood   transfusion   ° "related to hand laceration"  °• History of hiatal hernia   °• History of stomach ulcers   ° "bled when I was younger" (11/16/2016)  °• Hypertension   ° hx; "they took me off my RX" (11/16/2016)  °• Migraine 1970s  °• Phlebitis of leg, right   ° "years ago" (11/16/2016)  ° ° °Past Surgical History:  °Procedure Laterality Date  °• ABDOMINAL HYSTERECTOMY  1993  ° w/left ovary  °• ANKLE FRACTURE SURGERY Bilateral   °• APPENDECTOMY  1977  °• BACK SURGERY    °• CARDIAC CATHETERIZATION  2005  ° "negative"  °• CHOLECYSTECTOMY OPEN  1977  °• ESOPHAGOGASTRODUODENOSCOPY  2008  ° dx'd GERD  °• FOREARM FRACTURE  SURGERY Right   °• HIP SURGERY Bilateral   ° "took out bone spurs"  °• JOINT REPLACEMENT    °• KNEE ARTHROSCOPY Bilateral   ° "total of 18 times"  °• LACERATION REPAIR Right   ° hand; "went thru glass door"  °• LEFT HEART CATH AND CORONARY ANGIOGRAPHY N/A 11/18/2016  ° Procedure: Left Heart Cath and Coronary Angiography;  Surgeon: End, Christopher, MD;  Location: [facility];  Service: Cardiovascular;  Laterality: N/A;  °• LUMBAR FUSION    ° "put rods in"  °• LUMBAR LAMINECTOMY/DECOMPRESSION MICRODISCECTOMY  05/2006  °• OPEN REDUCTION INTERNAL FIXATION (ORIF) DISTAL RADIAL FRACTURE Left 06/18/2018  ° Procedure: OPEN REDUCTION INTERNAL FIXATION (ORIF) DISTAL RADIAL FRACTURE repair and/or reconstruction;  Surgeon: Ortmann, Fred, MD;  Location: [facility];  Service: Orthopedics;  Laterality: Left;  °• REVISION TOTAL KNEE ARTHROPLASTY Bilateral   °• RIGHT OOPHORECTOMY Right   °• SHOULDER OPEN ROTATOR CUFF REPAIR Bilateral   ° left 04/2006  °• TONSILLECTOMY    °• TOTAL KNEE ARTHROPLASTY Bilateral   ° left 1998; right 2004  °• TUBAL LIGATION    ° ° ° °Current Outpatient Medications  °Medication Sig Dispense Refill  °• Acetaminophen-Caffeine (EXCEDRIN ASPIRIN FREE PO) Take 2 tablets by mouth 3 (three) times daily as needed (pain).     °• albuterol (VENTOLIN HFA) 108 (90 Base) MCG/ACT inhaler albuterol sulfate HFA 90 mcg/actuation aerosol inhaler ° INHALE 2 PUFFS BY MOUTH EVERY 4 HOURS    °• ALPRAZolam (XANAX) 1 MG tablet Take 0.5 mg by mouth 3 (three) times daily as needed for anxiety.     °• Alum & Mag Hydroxide-Simeth (MAALOX ADVANCED PO) Take 15 mLs by mouth 3 (three) times daily as needed (acid reflux).     °• benzonatate (TESSALON) 200 MG capsule Take 200 mg by mouth 3 (three) times daily as needed.    °• cyclobenzaprine (FLEXERIL) 10 MG tablet Take 10 mg by mouth 2 (two) times daily as needed for muscle spasms.    °• DULoxetine (CYMBALTA) 30 MG capsule Take 30 mg by mouth daily.    °• famotidine (PEPCID) 20 MG tablet  famotidine 20 mg tablet ° TAKE 1 TABLET BY MOUTH TWICE DAILY FOR 90 DAYS    °• FLUoxetine (PROZAC) 20 MG capsule Take 60 mg by mouth daily.     °• furosemide (LASIX) 20 MG tablet Take 10-20 mg by mouth daily as needed for fluid or edema.     °• gabapentin (NEURONTIN) 300 MG capsule Take 300 mg by mouth 3 (three) times daily.    °• HYDROcodone-acetaminophen (NORCO/VICODIN) 5-325 MG tablet Take 1 tablet by mouth 3 (three) times daily.     °• ketoconazole (NIZORAL) 2 % cream ketoconazole 2 % topical cream °   APPLY CREAM TOPICALLY ONCE DAILY    °• lisinopril (ZESTRIL) 2.5 MG tablet Take 2.5 mg by mouth daily.    °• meloxicam (MOBIC) 15 MG tablet meloxicam 15 mg tablet ° TAKE 1 TABLET BY MOUTH ONCE DAILY    °• methocarbamol (ROBAXIN) 500 MG tablet methocarbamol 500 mg tablet ° Take 1 tablet 4 times a day by oral route as directed for 7 days.    °• nitroGLYCERIN (NITROSTAT) 0.4 MG SL tablet Place 1 tablet (0.4 mg total) under the tongue every 5 (five) minutes x 3 doses as needed for chest pain. 25 tablet 2  °• omeprazole (PRILOSEC) 20 MG capsule Take 20 mg by mouth 2 (two) times daily.    °• potassium chloride (MICRO-K) 10 MEQ CR capsule Take 10 mEq by mouth 2 (two) times daily.    °• promethazine (PHENERGAN) 25 MG tablet Take 12.5 mg by mouth every 8 (eight) hours as needed for nausea or vomiting.     °• ranitidine (ZANTAC) 150 MG tablet Take 150 mg by mouth daily.     °• rosuvastatin (CRESTOR) 20 MG tablet TAKE 1 TABLET BY MOUTH ONCE DAILY . APPOINTMENT REQUIRED FOR FUTURE REFILLS 30 tablet 0  °• sucralfate (CARAFATE) 1 g tablet sucralfate 1 gram tablet ° TAKE 1 TABLET BY MOUTH THREE TIMES DAILY    °• tiZANidine (ZANAFLEX) 4 MG tablet tizanidine 4 mg tablet ° TAKE 1 TABLET BY MOUTH THREE TIMES DAILY AS NEEDED    °• triamcinolone cream (KENALOG) 0.1 % triamcinolone acetonide 0.1 % topical cream ° APPLY CREAM TO RASH TWICE DAILY FROM NECK DOWN    ° °No current facility-administered medications for this visit.   ° ° °Allergies:   Aspirin and Oxybutynin  ° ° °Social History:  The patient  reports that she has never smoked. She has never used smokeless tobacco. She reports that she does not drink alcohol and does not use drugs.  ° °Family History:  The patient's  °family history includes CAD (age of onset: 40) in her father; COPD in her mother; Diabetes in her sister; Emphysema in her mother; Iron deficiency in her sister; Lung cancer in her father; Other in her sister.  ° ° °ROS:  Please see the history of present illness. Otherwise, review of systems are positive for none.  All other systems are reviewed and negative.  ° ° °PHYSICAL EXAM: °VS:  BP 130/80    Pulse 73    Ht 5' 3" (1.6 m)    Wt 213 lb (96.6 kg)    SpO2 94%    BMI 37.73 kg/m²  , BMI Body mass index is 37.73 kg/m².  ° °General: Well developed, well nourished, NAD °Neck: Negative for carotid bruits. No JVD °Lungs:Clear to ausculation bilaterally. No wheezes, rales, or rhonchi. Breathing is unlabored. °Cardiovascular: RRR with S1 S2. No murmurs °Extremities: No edema. Radial pulses 2+ bilaterally °Neuro: Alert and oriented. No focal deficits. No facial asymmetry. MAE spontaneously. °Psych: Responds to questions appropriately with normal affect.   ° ° °EKG:  EKG is ordered today. °The ekg ordered today demonstrates NSR with no acute change when compared to prior tracings  ° ° °Recent Labs: °No results found for requested labs within last 8760 hours.  ° ° °Lipid Panel °   °Component Value Date/Time  ° CHOL 130 11/17/2016 0413  ° TRIG 215 (H) 11/17/2016 0413  ° HDL 41 11/17/2016 0413  ° CHOLHDL 3.2 11/17/2016 0413  ° VLDL 43 (H) 11/17/2016 0413  ° LDLCALC   46 11/17/2016 0413  ° °  °Wt Readings from Last 3 Encounters:  °03/10/20 213 lb (96.6 kg)  °02/25/19 195 lb 6.4 oz (88.6 kg)  °11/06/18 203 lb 1.6 oz (92.1 kg)  °  °Other studies Reviewed: °Additional studies/ records that were reviewed today include:  °Review of the above records demonstrates: ° °LHC  11/18/2016: ° °Conclusions: °1. Mild to moderate, nonobstructive coronary artery disease as detailed below. °2. Normal left ventricular filling pressure. °  °Recommendations: °1. Medical therapy and risk factor modification to prevent progression of coronary artery disease. °2. We will discontinue standing furosemide, given the low normal left ventricular filling pressure in the setting of normal LV systolic function. ° °Diagnostic °Dominance: Right ° °Intervention ° ° ° °Echocardiogram 11/17/2016: ° °Study Conclusions  ° °- Left ventricle: The cavity size was normal. Wall thickness was  °  normal. Systolic function was normal. The estimated ejection  °  fraction was in the range of 60% to 65%.  °- Mitral valve: There was mild regurgitation.  °- Left atrium: The atrium was moderately dilated.  ° ° °ASSESSMENT AND PLAN: ° °1.  Chest pain/pressure with a known nonobstructive CAD per LHC 2018: °-Patient has a history of nonobstructive CAD with last cardiac catheterization in 2018 which showed a 40% mid LAD, 50% diagonal, and 25% proximal mid RCA lesions with recommendations to treat medically °-As above, she reports more recent issues with chest pressure in the last 2 to 3 weeks which occur with exertion, relieved with rest.  Also occur when labile BPs are elevated or when BPs drop very low.  We discussed further work-up with LHC given her prior abnormal stress test and coronary CT with known mild nonobstructive CAD from 2018 °-BPs to labile at this time to add beta-blocker>> will reconsider and follow-up °-Continue statin>>add ASA  °-Obtain BMET, CBC  °-Supply SL NTG>>directed on use °-EKG with NSR and no acute changes when compared to prior tracings  °-Cardiac catheterization was discussed with the patient fully. The patient understands that risks include but are not limited to stroke (1 in 1000), death (1 in 1000), kidney failure [usually temporary] (1 in 500), bleeding (1 in 200), allergic reaction [possibly serious]  (1 in 200).  The patient understands and is willing to proceed.   ° °2.  DOE with fatigue: °-Patient reports that several months ago, she was able to walk 3 to 4 miles with her dog without any issues however she no longer can perform this task secondary to overwhelming fatigue, shortness of breath and chest pressure °-Last echocardiogram from 2018 with normal LV function.   °-We will plan to repeat echocardiogram for full LV assessment °-Reports intermittent LE edema.  Has Lasix as needed at home however has not taken this as she was directed by her PCP to hold off given her labile BPs ° °3.  Labile blood pressures: °-Patient reports that over the last several months BPs have been variable with extreme highs and lows.  She has been seen by her PCP on several occasions for this.  She is currently only on lisinopril 2.5 mg however reports SBP's typically in the 130 range however will rise up to the 160-180 range and will also go low with SBP in the 60s. °-No changes in medication now °-ED precautions reviewed with patient for extreme lows °-Continue to stay well-hydrated ° °4.  History of HLD: °-Followed by PCP °-Continue Crestor ° °5.  History of prolonged QT interval: °-QTC today at 475ms ° ° °Current medicines are reviewed   at length with the patient today.  The patient does not have concerns regarding medicines. ° °The following changes have been made:  Add SL NTG, ASA ° °Labs/ tests ordered today include: CBC, BMET, LHC, echocardiogram  °No orders of the defined types were placed in this encounter. ° ° °Disposition:   FU with myself  in 3 weeks ° °Signed, °Demetrius Mahler, NP  °03/10/2020 4:43 PM    °[facility] °[address], [place], [place]  [address] °Phone: [phone]; Fax: [phone]  ° °

## 2020-03-09 NOTE — Progress Notes (Signed)
Cardiology Office Note   Date:  03/10/2020   ID:  [name], DOB [birth year], MRN [id number]  PCP:  Georgann Housekeeper, MD  Cardiologist: Dr. Katrinka Blazing, MD   Chief Complaint  Patient presents with   Follow-up    History of Present Illness: [name] is a 64 y.o. female who presents for follow-up, seen for Dr. Katrinka Blazing.  Last seen 05/19/2017.  [name] has a history of non-obstructive CAD, anxiety, depression, recurrent chest pain and prolonged QTC which has been felt to be secondary to hypokalemia.  She previously underwent LHC 10/29/2003 per Dr. Katrinka Blazing which essentially showed normal coronary arteries with mild plaquing in the mid LAD and distal RCA with a normal LVEF.  Cath performed secondary to chest pain.  In 2018 she was admitted for dizziness and bradycardia with heart rates in the 40s to 50s.  TSH was normal and troponin levels were negative.  Echocardiogram showed normal LVEF with mild MR and moderate LAE.  Stress test showed moderate sized area of decreased myocardial activity in the mid and distal inferior walls with reversibility.  Coronary CTA showed high calcium score of 848 and possible hemodynamically significant stenosis in the proximal, mid LAD. LHC performed 11/18/2016 and she was found to have mild to moderate nonobstructive CAD with 40% mid LAD, 50% diagonal and 25% mid RCA disease.  Medical therapy was recommended.  She was placed on Crestor.  Estrace was discontinued in the setting of CAD.  She was not placed on ASA given peptic ulcer disease.  QTC at the time of admission was 526 ms in the setting of hypokalemia.  QT normalized with resolution of hypokalemia.  She was seen by EP who recommended further monitoring as an outpatient and avoidance of QT prolonging medications therefore Phenergan and Seroquel were discontinued.  She was then seen by Dr. Katrinka Blazing 05/19/2017 at which time he wished to see her back on an as-needed basis due to no CAD, normal monitor and  no recurrent issues>> management per PCP going forward.  [name] presents with her husband today and reports that over the past 3 to 4 weeks she has been having intermittent chest pressure which worsens on exertion and relieved with rest.  She also has been having issues with shortness of breath and LE edema.  She states that she was previously walking 3 to 4 miles per day with her dog and can no longer do these activities due to overwhelming fatigue, shortness of breath and chest pain.  She is also been having issues with labile BPs despite being only on lisinopril 2.5 mg.  Both husband and wife state that they do not eat takeout and cook all her meals at home.  They use [employer] for seasoning therefore very unlikely diet related.  She has been seen several times for PCP with no clear etiology.  Given this, he has referred her back to cardiology for further work-up given nonobstructive CAD.    Past Medical History:  Diagnosis Date   Anemia    Anxiety    Arthritis    "all over" (11/16/2016)   Chronic lower back pain    Chronic pain syndrome    Chronic pain syndrome    "all over" (11/16/2016)   CKD (chronic kidney disease), stage III (HCC)    Daily headache    Depression    Fibromyalgia    GERD (gastroesophageal reflux disease)    High cholesterol    History of blood  transfusion    "related to hand laceration"   History of hiatal hernia    History of stomach ulcers    "bled when I was younger" (11/16/2016)   Hypertension    hx; "they took me off my RX" (11/16/2016)   Migraine 1970s   Phlebitis of leg, right    "years ago" (11/16/2016)    Past Surgical History:  Procedure Laterality Date   ABDOMINAL HYSTERECTOMY  1993   w/left ovary   ANKLE FRACTURE SURGERY Bilateral    APPENDECTOMY  1977   BACK SURGERY     CARDIAC CATHETERIZATION  2005   "negative"   CHOLECYSTECTOMY OPEN  1977   ESOPHAGOGASTRODUODENOSCOPY  2008   dx'd GERD   FOREARM FRACTURE  SURGERY Right    HIP SURGERY Bilateral    "took out bone spurs"   JOINT REPLACEMENT     KNEE ARTHROSCOPY Bilateral    "total of 18 times"   LACERATION REPAIR Right    hand; "went thru glass door"   LEFT HEART CATH AND CORONARY ANGIOGRAPHY N/A 11/18/2016   Procedure: Left Heart Cath and Coronary Angiography;  Surgeon: Yvonne Kendall, MD;  Location: [facility];  Service: Cardiovascular;  Laterality: N/A;   LUMBAR FUSION     "put rods in"   LUMBAR LAMINECTOMY/DECOMPRESSION MICRODISCECTOMY  05/2006   OPEN REDUCTION INTERNAL FIXATION (ORIF) DISTAL RADIAL FRACTURE Left 06/18/2018   Procedure: OPEN REDUCTION INTERNAL FIXATION (ORIF) DISTAL RADIAL FRACTURE repair and/or reconstruction;  Surgeon: Bradly Bienenstock, MD;  Location: [facility] OR;  Service: Orthopedics;  Laterality: Left;   REVISION TOTAL KNEE ARTHROPLASTY Bilateral    RIGHT OOPHORECTOMY Right    SHOULDER OPEN ROTATOR CUFF REPAIR Bilateral    left 04/2006   TONSILLECTOMY     TOTAL KNEE ARTHROPLASTY Bilateral    left 1998; right 2004   TUBAL LIGATION       Current Outpatient Medications  Medication Sig Dispense Refill   Acetaminophen-Caffeine (EXCEDRIN ASPIRIN FREE PO) Take 2 tablets by mouth 3 (three) times daily as needed (pain).      albuterol (VENTOLIN HFA) 108 (90 Base) MCG/ACT inhaler albuterol sulfate HFA 90 mcg/actuation aerosol inhaler  INHALE 2 PUFFS BY MOUTH EVERY 4 HOURS     ALPRAZolam (XANAX) 1 MG tablet Take 0.5 mg by mouth 3 (three) times daily as needed for anxiety.      Alum & Mag Hydroxide-Simeth (MAALOX ADVANCED PO) Take 15 mLs by mouth 3 (three) times daily as needed (acid reflux).      benzonatate (TESSALON) 200 MG capsule Take 200 mg by mouth 3 (three) times daily as needed.     cyclobenzaprine (FLEXERIL) 10 MG tablet Take 10 mg by mouth 2 (two) times daily as needed for muscle spasms.     DULoxetine (CYMBALTA) 30 MG capsule Take 30 mg by mouth daily.     famotidine (PEPCID) 20 MG tablet  famotidine 20 mg tablet  TAKE 1 TABLET BY MOUTH TWICE DAILY FOR 90 DAYS     FLUoxetine (PROZAC) 20 MG capsule Take 60 mg by mouth daily.      furosemide (LASIX) 20 MG tablet Take 10-20 mg by mouth daily as needed for fluid or edema.      gabapentin (NEURONTIN) 300 MG capsule Take 300 mg by mouth 3 (three) times daily.     HYDROcodone-acetaminophen (NORCO/VICODIN) 5-325 MG tablet Take 1 tablet by mouth 3 (three) times daily.      ketoconazole (NIZORAL) 2 % cream ketoconazole 2 % topical cream  APPLY CREAM TOPICALLY ONCE DAILY     lisinopril (ZESTRIL) 2.5 MG tablet Take 2.5 mg by mouth daily.     meloxicam (MOBIC) 15 MG tablet meloxicam 15 mg tablet  TAKE 1 TABLET BY MOUTH ONCE DAILY     methocarbamol (ROBAXIN) 500 MG tablet methocarbamol 500 mg tablet  Take 1 tablet 4 times a day by oral route as directed for 7 days.     nitroGLYCERIN (NITROSTAT) 0.4 MG SL tablet Place 1 tablet (0.4 mg total) under the tongue every 5 (five) minutes x 3 doses as needed for chest pain. 25 tablet 2   omeprazole (PRILOSEC) 20 MG capsule Take 20 mg by mouth 2 (two) times daily.     potassium chloride (MICRO-K) 10 MEQ CR capsule Take 10 mEq by mouth 2 (two) times daily.     promethazine (PHENERGAN) 25 MG tablet Take 12.5 mg by mouth every 8 (eight) hours as needed for nausea or vomiting.      ranitidine (ZANTAC) 150 MG tablet Take 150 mg by mouth daily.      rosuvastatin (CRESTOR) 20 MG tablet TAKE 1 TABLET BY MOUTH ONCE DAILY . APPOINTMENT REQUIRED FOR FUTURE REFILLS 30 tablet 0   sucralfate (CARAFATE) 1 g tablet sucralfate 1 gram tablet  TAKE 1 TABLET BY MOUTH THREE TIMES DAILY     tiZANidine (ZANAFLEX) 4 MG tablet tizanidine 4 mg tablet  TAKE 1 TABLET BY MOUTH THREE TIMES DAILY AS NEEDED     triamcinolone cream (KENALOG) 0.1 % triamcinolone acetonide 0.1 % topical cream  APPLY CREAM TO RASH TWICE DAILY FROM NECK DOWN     No current facility-administered medications for this visit.     Allergies:   Aspirin and Oxybutynin    Social History:  The patient  reports that she has never smoked. She has never used smokeless tobacco. She reports that she does not drink alcohol and does not use drugs.   Family History:  The patient's  family history includes CAD (age of onset: 78) in her father; COPD in her mother; Diabetes in her sister; Emphysema in her mother; Iron deficiency in her sister; Lung cancer in her father; Other in her sister.    ROS:  Please see the history of present illness. Otherwise, review of systems are positive for none.  All other systems are reviewed and negative.    PHYSICAL EXAM: VS:  BP 130/80    Pulse 73    Ht 5\' 3"  (1.6 m)    Wt 213 lb (96.6 kg)    SpO2 94%    BMI 37.73 kg/m  , BMI Body mass index is 37.73 kg/m.   General: Well developed, well nourished, NAD Neck: Negative for carotid bruits. No JVD Lungs:Clear to ausculation bilaterally. No wheezes, rales, or rhonchi. Breathing is unlabored. Cardiovascular: RRR with S1 S2. No murmurs Extremities: No edema. Radial pulses 2+ bilaterally Neuro: Alert and oriented. No focal deficits. No facial asymmetry. MAE spontaneously. Psych: Responds to questions appropriately with normal affect.     EKG:  EKG is ordered today. The ekg ordered today demonstrates NSR with no acute change when compared to prior tracings    Recent Labs: No results found for requested labs within last 8760 hours.    Lipid Panel    Component Value Date/Time   CHOL 130 11/17/2016 0413   TRIG 215 (H) 11/17/2016 0413   HDL 41 11/17/2016 0413   CHOLHDL 3.2 11/17/2016 0413   VLDL 43 (H) 11/17/2016 0413   LDLCALC  46 11/17/2016 0413     Wt Readings from Last 3 Encounters:  03/10/20 213 lb (96.6 kg)  02/25/19 195 lb 6.4 oz (88.6 kg)  11/06/18 203 lb 1.6 oz (92.1 kg)    Other studies Reviewed: Additional studies/ records that were reviewed today include:  Review of the above records demonstrates:  [facility]  11/18/2016:  Conclusions: 1. Mild to moderate, nonobstructive coronary artery disease as detailed below. 2. Normal left ventricular filling pressure.  Recommendations: 1. Medical therapy and risk factor modification to prevent progression of coronary artery disease. 2. We will discontinue standing furosemide, given the low normal left ventricular filling pressure in the setting of normal LV systolic function.  Diagnostic Dominance: Right  Intervention    Echocardiogram 11/17/2016:  Study Conclusions   - Left ventricle: The cavity size was normal. Wall thickness was  normal. Systolic function was normal. The estimated ejection  fraction was in the range of 60% to 65%.  - Mitral valve: There was mild regurgitation.  - Left atrium: The atrium was moderately dilated.    ASSESSMENT AND PLAN:  1.  Chest pain/pressure with a known nonobstructive CAD per LHC 2018: -Patient has a history of nonobstructive CAD with last cardiac catheterization in 2018 which showed a 40% mid LAD, 50% diagonal, and 25% proximal mid RCA lesions with recommendations to treat medically -As above, she reports more recent issues with chest pressure in the last 2 to 3 weeks which occur with exertion, relieved with rest.  Also occur when labile BPs are elevated or when BPs drop very low.  We discussed further work-up with LHC given her prior abnormal stress test and coronary CT with known mild nonobstructive CAD from 2018 -BPs to labile at this time to add beta-blocker>> will reconsider and follow-up -Continue statin>>add ASA  -Obtain BMET, CBC  -Supply SL NTG>>directed on use -EKG with NSR and no acute changes when compared to prior tracings  -Cardiac catheterization was discussed with the patient fully. The patient understands that risks include but are not limited to stroke (1 in 1000), death (1 in 1000), kidney failure [usually temporary] (1 in 500), bleeding (1 in 200), allergic reaction [possibly serious]  (1 in 200).  The patient understands and is willing to proceed.    2.  DOE with fatigue: -Patient reports that several months ago, she was able to walk 3 to 4 miles with her dog without any issues however she no longer can perform this task secondary to overwhelming fatigue, shortness of breath and chest pressure -Last echocardiogram from 2018 with normal LV function.   -We will plan to repeat echocardiogram for full LV assessment -Reports intermittent LE edema.  Has Lasix as needed at home however has not taken this as she was directed by her PCP to hold off given her labile BPs  3.  Labile blood pressures: -Patient reports that over the last several months BPs have been variable with extreme highs and lows.  She has been seen by her PCP on several occasions for this.  She is currently only on lisinopril 2.5 mg however reports SBP's typically in the 130 range however will rise up to the 160-180 range and will also go low with SBP in the 60s. -No changes in medication now -ED precautions reviewed with patient for extreme lows -Continue to stay well-hydrated  4.  History of HLD: -Followed by PCP -Continue Crestor  5.  History of prolonged QT interval: -QTC today at   Current medicines are reviewed  at length with the patient today.  The patient does not have concerns regarding medicines.  The following changes have been made:  Add SL NTG, ASA  Labs/ tests ordered today include: CBC, BMET, LHC, echocardiogram  No orders of the defined types were placed in this encounter.   Disposition:   FU with myself  in 3 weeks  Signed, Georgie ChardJill Rogelio Winbush, NP  03/10/2020 4:43 PM    [facility] [address], [place], [place]  [id number] Phone: [phone](336) [phone]; Fax: [phone]) [phone]

## 2020-03-10 ENCOUNTER — Encounter: Payer: Self-pay | Admitting: Cardiology

## 2020-03-10 ENCOUNTER — Other Ambulatory Visit: Payer: Self-pay

## 2020-03-10 ENCOUNTER — Ambulatory Visit: Payer: Medicare Other | Admitting: Cardiology

## 2020-03-10 VITALS — BP 130/80 | HR 73 | Ht 63.0 in | Wt 213.0 lb

## 2020-03-10 DIAGNOSIS — I25119 Atherosclerotic heart disease of native coronary artery with unspecified angina pectoris: Secondary | ICD-10-CM | POA: Diagnosis not present

## 2020-03-10 DIAGNOSIS — E7800 Pure hypercholesterolemia, unspecified: Secondary | ICD-10-CM

## 2020-03-10 DIAGNOSIS — R600 Localized edema: Secondary | ICD-10-CM

## 2020-03-10 DIAGNOSIS — R0600 Dyspnea, unspecified: Secondary | ICD-10-CM

## 2020-03-10 DIAGNOSIS — I10 Essential (primary) hypertension: Secondary | ICD-10-CM | POA: Diagnosis not present

## 2020-03-10 DIAGNOSIS — R0609 Other forms of dyspnea: Secondary | ICD-10-CM

## 2020-03-10 DIAGNOSIS — R079 Chest pain, unspecified: Secondary | ICD-10-CM

## 2020-03-10 MED ORDER — ASPIRIN EC 81 MG PO TBEC: 81.0000 mg | DELAYED_RELEASE_TABLET | Freq: Every day | ORAL | 3 refills | Status: DC

## 2020-03-10 MED ORDER — NITROGLYCERIN 0.4 MG SL SUBL: 0.4000 mg | SUBLINGUAL_TABLET | SUBLINGUAL | 2 refills | Status: AC | PRN

## 2020-03-10 NOTE — Patient Instructions (Addendum)
Medication Instructions:  1. Start taking one 81 mg of Aspirin by mouth daily. *If you need a refill on your cardiac medications before your next appointment, please call your pharmacy*   Lab Work: BMET and CBC today If you have labs (blood work) drawn today and your tests are completely normal, you will receive your results only by: Marland Kitchen MyChart Message (if you have MyChart) OR . A paper copy in the mail If you have any lab test that is abnormal or we need to change your treatment, we will call you to review the results.   Testing/Procedures: Your physician has requested that you have an echocardiogram. Echocardiography is a painless test that uses sound waves to create images of your heart. It provides your doctor with information about the size and shape of your heart and how well your heart's chambers and valves are working. This procedure takes approximately one hour. There are no restrictions for this procedure.     Follow-Up: At [facility], you and your health needs are our priority.  As part of our continuing mission to provide you with exceptional heart care, we have created designated Provider Care Teams.  These Care Teams include your primary Cardiologist (physician) and Advanced Practice Providers (APPs -  Physician Assistants and Nurse Practitioners) who all work together to provide you with the care you need, when you need it.    Your next appointment:   Follow up one week after your cath.    Due to recent COVID-19 restrictions implemented by our local and state authorities and in an effort to keep both patients and staff as safe as possible, our hospital system requires COVID-19 testing prior to certain scheduled hospital procedures.  Please go to [address] [facility] [address] [place], [place] [address] on 03/13/2020 at 1:55 PM.  This is a drive up testing site.  You will not need to exit your vehicle.  You will not be billed at the time of testing but may receive a bill later  depending on your insurance. You must agree to self-quarantine from the time of your testing until the procedure date on 03/26/2020.  This should included staying home with ONLY the people you live with.  Avoid take-out, grocery store shopping or leaving the house for any non-emergent reason.  Failure to have your COVID-19 test done on the date and time you have been scheduled will result in cancellation of your procedure.  Please call our office at [phone] if you have any questions.      [facility] CARDIOVASCULAR DIVISION [facility] ST OFFICE [address] [place] [address] Dept: [phone] Loc: [phone]  [name]  03/10/2020  You are scheduled for a Cardiac Catheterization on Monday, November 8 with Dr. Cristal De[place].  1. Please arrive at the [facility] (Main Entrance A) at [facility]: [address] [place], [place] [address] at 7:00 AM (This time is two hours before your procedure to ensure your preparation). Free valet parking service is available.   Special note: Every effort is made to have your procedure done on time. Please understand that emergencies sometimes delay scheduled procedures.  2. Diet: Do not eat solid foods after midnight.  The patient may have clear liquids until 5am upon the day of the procedure.  3. Labs: You will need to have blood drawn today.  You do not need to be fasting.  4. Medication instructions in preparation for your procedure:  Contrast Allergy: No     On the morning of your procedure, take your Aspirin 81 mg and any morning medicines NOT listed above.  You may use sips of water.  5. Plan for one night stay--bring personal belongings. 6. Bring a current list of your medications and current insurance cards. 7. You MUST have a responsible person to drive you home. 8. Someone MUST be with you the first 24 hours after you arrive home or your discharge will be  delayed. 9. Please wear clothes that are easy to get on and off and wear slip-on shoes.  Thank you for allowing [place] to care for you!   -- [facility]

## 2020-03-12 ENCOUNTER — Telehealth: Payer: Self-pay | Admitting: *Deleted

## 2020-03-12 NOTE — Telephone Encounter (Signed)
Pt contacted pre-catheterization scheduled at [facility] for: Monday March 16, 2020 9 AM Verified arrival time and place: [facility] Main Entrance A [facility]) at: 7 AM  No solid food after midnight prior to cath, clear liquids until 5 AM day of procedure.  Hold: Lasix/KCl-AM of procedure  Except hold medications AM meds can be  taken pre-cath with sips of water including: ASA 81 mg   Confirmed patient has responsible adult to drive home post procedure and be with patient first 24 hours after arriving home: yes  You are allowed ONE visitor in the waiting room during the time you are at the hospital for your procedure. Both you and your visitor must wear a mask once you enter the hospital.       COVID-19 Pre-Screening Questions:   In the past 14 days have you had a new cough, new headache, new nasal congestion, fever (100.4 or greater) unexplained body aches, new sore throat, or sudden loss of taste or sense of smell? no  In the past 14 days have you been around anyone with known Covid 19? no    Reviewed procedure/mask/visitor instructions, COVID-19 questions with patient.

## 2020-03-13 ENCOUNTER — Other Ambulatory Visit: Payer: Medicare Other | Admitting: *Deleted

## 2020-03-13 ENCOUNTER — Other Ambulatory Visit: Payer: Self-pay

## 2020-03-13 ENCOUNTER — Other Ambulatory Visit (HOSPITAL_COMMUNITY)
Admission: RE | Admit: 2020-03-13 | Discharge: 2020-03-13 | Disposition: A | Discharge status: Home / Self Care | Payer: Medicare Other | Source: Ambulatory Visit | Attending: Internal Medicine | Admitting: Internal Medicine

## 2020-03-13 DIAGNOSIS — R0600 Dyspnea, unspecified: Secondary | ICD-10-CM

## 2020-03-13 DIAGNOSIS — Z20822 Contact with and (suspected) exposure to covid-19: Secondary | ICD-10-CM | POA: Diagnosis not present

## 2020-03-13 DIAGNOSIS — R0609 Other forms of dyspnea: Secondary | ICD-10-CM

## 2020-03-13 DIAGNOSIS — I10 Essential (primary) hypertension: Secondary | ICD-10-CM

## 2020-03-13 DIAGNOSIS — I25119 Atherosclerotic heart disease of native coronary artery with unspecified angina pectoris: Secondary | ICD-10-CM

## 2020-03-13 DIAGNOSIS — E7800 Pure hypercholesterolemia, unspecified: Secondary | ICD-10-CM

## 2020-03-13 DIAGNOSIS — R600 Localized edema: Secondary | ICD-10-CM

## 2020-03-13 DIAGNOSIS — Z01812 Encounter for preprocedural laboratory examination: Secondary | ICD-10-CM | POA: Diagnosis present

## 2020-03-13 LAB — SARS CORONAVIRUS 2 (TAT 6-24 HRS): SARS Coronavirus 2: NEGATIVE

## 2020-03-14 LAB — BASIC METABOLIC PANEL
BUN/Creatinine Ratio: 17 (ref 12–28)
BUN: 19 mg/dL (ref 8–27)
CO2: 24 mmol/L (ref 20–29)
Calcium: 9.7 mg/dL (ref 8.7–10.3)
Chloride: 103 mmol/L (ref 96–106)
Creatinine, Ser: 1.09 mg/dL — ABNORMAL HIGH (ref 0.57–1.00)
GFR calc Af Amer: 61 mL/min/{1.73_m2} (ref >59)
GFR calc non Af Amer: 53 mL/min/{1.73_m2} — ABNORMAL LOW (ref >59)
Glucose: 99 mg/dL (ref 65–99)
Potassium: 4.8 mmol/L (ref 3.5–5.2)
Sodium: 141 mmol/L (ref 134–144)

## 2020-03-14 LAB — CBC
Hematocrit: 36.3 % (ref 34.0–46.6)
Hemoglobin: 12 g/dL (ref 11.1–15.9)
MCH: 27 pg (ref 26.6–33.0)
MCHC: 33.1 g/dL (ref 31.5–35.7)
MCV: 82 fL (ref 79–97)
Platelets: 196 10*3/uL (ref 150–450)
RBC: 4.45 x10E6/uL (ref 3.77–5.28)
RDW: 13.8 % (ref 11.7–15.4)
WBC: 4.9 10*3/uL (ref 3.4–10.8)

## 2020-03-16 ENCOUNTER — Ambulatory Visit (HOSPITAL_COMMUNITY)
Admission: RE | Admit: 2020-03-16 | Discharge: 2020-03-16 | Disposition: A | Discharge status: Home / Self Care | Payer: Medicare Other | Attending: Internal Medicine | Admitting: Internal Medicine

## 2020-03-16 ENCOUNTER — Encounter (HOSPITAL_COMMUNITY): Payer: Self-pay | Admitting: Internal Medicine

## 2020-03-16 ENCOUNTER — Other Ambulatory Visit: Payer: Self-pay

## 2020-03-16 ENCOUNTER — Encounter (HOSPITAL_COMMUNITY)
Admission: RE | Disposition: A | Discharge status: Home / Self Care | Payer: Self-pay | Source: Home / Self Care | Attending: Internal Medicine

## 2020-03-16 DIAGNOSIS — Z79899 Other long term (current) drug therapy: Secondary | ICD-10-CM | POA: Insufficient documentation

## 2020-03-16 DIAGNOSIS — I200 Unstable angina: Secondary | ICD-10-CM | POA: Diagnosis present

## 2020-03-16 DIAGNOSIS — R5383 Other fatigue: Secondary | ICD-10-CM | POA: Diagnosis not present

## 2020-03-16 DIAGNOSIS — R0609 Other forms of dyspnea: Secondary | ICD-10-CM | POA: Insufficient documentation

## 2020-03-16 DIAGNOSIS — I25110 Atherosclerotic heart disease of native coronary artery with unstable angina pectoris: Secondary | ICD-10-CM

## 2020-03-16 DIAGNOSIS — I25119 Atherosclerotic heart disease of native coronary artery with unspecified angina pectoris: Secondary | ICD-10-CM | POA: Diagnosis present

## 2020-03-16 DIAGNOSIS — E785 Hyperlipidemia, unspecified: Secondary | ICD-10-CM | POA: Insufficient documentation

## 2020-03-16 HISTORY — PX: LEFT HEART CATH AND CORONARY ANGIOGRAPHY: CATH118249

## 2020-03-16 SURGERY — LEFT HEART CATH AND CORONARY ANGIOGRAPHY
Anesthesia: LOCAL

## 2020-03-16 MED ORDER — MIDAZOLAM HCL 2 MG/2ML IJ SOLN
INTRAMUSCULAR | Status: DC | PRN
  Administered 2020-03-16: 1 mg via INTRAVENOUS

## 2020-03-16 MED ORDER — FENTANYL CITRATE (PF) 100 MCG/2ML IJ SOLN
INTRAMUSCULAR | Status: AC
  Filled 2020-03-16: qty 2

## 2020-03-16 MED ORDER — ASPIRIN 81 MG PO CHEW: 81.0000 mg | CHEWABLE_TABLET | ORAL | Status: DC

## 2020-03-16 MED ORDER — MIDAZOLAM HCL 2 MG/2ML IJ SOLN
INTRAMUSCULAR | Status: AC
  Filled 2020-03-16: qty 2

## 2020-03-16 MED ORDER — HEPARIN SODIUM (PORCINE) 1000 UNIT/ML IJ SOLN
INTRAMUSCULAR | Status: AC
  Filled 2020-03-16: qty 1

## 2020-03-16 MED ORDER — SODIUM CHLORIDE 0.9 % IV SOLN: 250.0000 mL | INTRAVENOUS | Status: DC | PRN

## 2020-03-16 MED ORDER — LIDOCAINE HCL (PF) 1 % IJ SOLN
INTRAMUSCULAR | Status: DC | PRN
  Administered 2020-03-16: 2 mL

## 2020-03-16 MED ORDER — VERAPAMIL HCL 2.5 MG/ML IV SOLN
INTRAVENOUS | Status: AC
  Filled 2020-03-16: qty 2

## 2020-03-16 MED ORDER — HEPARIN (PORCINE) IN NACL 1000-0.9 UT/500ML-% IV SOLN
INTRAVENOUS | Status: DC | PRN
  Administered 2020-03-16 (×2): 500 mL

## 2020-03-16 MED ORDER — FENTANYL CITRATE (PF) 100 MCG/2ML IJ SOLN
INTRAMUSCULAR | Status: DC | PRN
  Administered 2020-03-16: 25 ug via INTRAVENOUS

## 2020-03-16 MED ORDER — HEPARIN (PORCINE) IN NACL 1000-0.9 UT/500ML-% IV SOLN
INTRAVENOUS | Status: AC
  Filled 2020-03-16: qty 1000

## 2020-03-16 MED ORDER — SODIUM CHLORIDE 0.9 % WEIGHT BASED INFUSION: 1.0000 mL/kg/h | INTRAVENOUS | Status: DC

## 2020-03-16 MED ORDER — IOHEXOL 350 MG/ML SOLN
INTRAVENOUS | Status: DC | PRN
  Administered 2020-03-16: 80 mL

## 2020-03-16 MED ORDER — HEPARIN SODIUM (PORCINE) 1000 UNIT/ML IJ SOLN
INTRAMUSCULAR | Status: DC | PRN
  Administered 2020-03-16: 5000 [IU] via INTRAVENOUS

## 2020-03-16 MED ORDER — LIDOCAINE HCL (PF) 1 % IJ SOLN
INTRAMUSCULAR | Status: AC
  Filled 2020-03-16: qty 30

## 2020-03-16 MED ORDER — SODIUM CHLORIDE 0.9% FLUSH: 3.0000 mL | Freq: Two times a day (BID) | INTRAVENOUS | Status: DC

## 2020-03-16 MED ORDER — SODIUM CHLORIDE 0.9% FLUSH: 3.0000 mL | INTRAVENOUS | Status: DC | PRN

## 2020-03-16 MED ORDER — SODIUM CHLORIDE 0.9 % WEIGHT BASED INFUSION
3.0000 mL/kg/h | INTRAVENOUS | Status: AC
  Administered 2020-03-16: 3 mL/kg/h via INTRAVENOUS

## 2020-03-16 MED ORDER — VERAPAMIL HCL 2.5 MG/ML IV SOLN
INTRAVENOUS | Status: DC | PRN
  Administered 2020-03-16: 10 mL via INTRA_ARTERIAL

## 2020-03-16 SURGICAL SUPPLY — 15 items
CATH INFINITI 5 FR 3DRC (CATHETERS) ×1 IMPLANT
CATH INFINITI 5FR AL1 (CATHETERS) ×1 IMPLANT
CATH INFINITI 5FR ANG PIGTAIL (CATHETERS) ×1 IMPLANT
CATH INFINITI JR4 5F (CATHETERS) ×1 IMPLANT
CATH OPTITORQUE TIG 4.0 5F (CATHETERS) ×1 IMPLANT
DEVICE RAD COMP TR BAND LRG (VASCULAR PRODUCTS) ×1 IMPLANT
GLIDESHEATH SLEND SS 6F .021 (SHEATH) ×1 IMPLANT
GUIDEWIRE INQWIRE 1.5J.035X260 (WIRE) IMPLANT
INQWIRE 1.5J .035X260CM (WIRE) ×2
KIT HEART LEFT (KITS) ×2 IMPLANT
PACK CARDIAC CATHETERIZATION (CUSTOM PROCEDURE TRAY) ×2 IMPLANT
SYR MEDRAD MARK 7 150ML (SYRINGE) ×2 IMPLANT
TRANSDUCER W/STOPCOCK (MISCELLANEOUS) ×2 IMPLANT
TUBING CIL FLEX 10 FLL-RA (TUBING) ×2 IMPLANT
WIRE HI TORQ VERSACORE-J 145CM (WIRE) ×1 IMPLANT

## 2020-03-16 NOTE — Progress Notes (Signed)
Patient and spouse was given discharge instructions. Both verbalized understanding. 

## 2020-03-16 NOTE — Discharge Instructions (Signed)
Drink plenty of fluid for 48 hours and keep wrist elevated at heart level for 24 hours  Radial Site Care   This sheet gives you information about how to care for yourself after your procedure. Your health care provider may also give you more specific instructions. If you have problems or questions, contact your health care provider. What can I expect after the procedure? After the procedure, it is common to have:  Bruising and tenderness at the catheter insertion area. Follow these instructions at home: Medicines  Take over-the-counter and prescription medicines only as told by your health care provider. Insertion site care 1. Follow instructions from your health care provider about how to take care of your insertion site. Make sure you: ? Wash your hands with soap and water before you change your bandage (dressing). If soap and water are not available, use hand sanitizer. ? remove your dressing as told by your health care provider. In 24 hours 2. Check your insertion site every day for signs of infection. Check for: ? Redness, swelling, or pain. ? Fluid or blood. ? Pus or a bad smell. ? Warmth. 3. Do not take baths, swim, or use a hot tub until your health care provider approves. 4. You may shower 24-48 hours after the procedure, or as directed by your health care provider. ? Remove the dressing and gently wash the site with plain soap and water. ? Pat the area dry with a clean towel. ? Do not rub the site. That could cause bleeding. 5. Do not apply powder or lotion to the site. Activity   1. For 24 hours after the procedure, or as directed by your health care provider: ? Do not flex or bend the affected arm. ? Do not push or pull heavy objects with the affected arm. ? Do not drive yourself home from the hospital or clinic. You may drive 24 hours after the procedure unless your health care provider tells you not to. ? Do not operate machinery or power tools. 2. Do not lift  anything that is heavier than 10 lb (4.5 kg), or the limit that you are told, until your health care provider says that it is safe. For 4 days 3. Ask your health care provider when it is okay to: ? Return to work or school. ? Resume usual physical activities or sports. ? Resume sexual activity. General instructions  If the catheter site starts to bleed, raise your arm and put firm pressure on the site. If the bleeding does not stop, get help right away. This is a medical emergency.  If you went home on the same day as your procedure, a responsible adult should be with you for the first 24 hours after you arrive home.  Keep all follow-up visits as told by your health care provider. This is important. Contact a health care provider if:  You have a fever.  You have redness, swelling, or yellow drainage around your insertion site. Get help right away if:  You have unusual pain at the radial site.  The catheter insertion area swells very fast.  The insertion area is bleeding, and the bleeding does not stop when you hold steady pressure on the area.  Your arm or hand becomes pale, cool, tingly, or numb. These symptoms may represent a serious problem that is an emergency. Do not wait to see if the symptoms will go away. Get medical help right away. Call your local emergency services (911 in the [place] Do not   drive yourself to the hospital. Summary  After the procedure, it is common to have bruising and tenderness at the site.  Follow instructions from your health care provider about how to take care of your radial site wound. Check the wound every day for signs of infection.  Do not lift anything that is heavier than 10 lb (4.5 kg), or the limit that you are told, until your health care provider says that it is safe. This information is not intended to replace advice given to you by your health care provider. Make sure you discuss any questions you have with your health care  provider. Document Revised: 05/31/2017 Document Reviewed: 05/31/2017 Elsevier Patient Education  2020 [employer]  

## 2020-03-16 NOTE — Interval H&P Note (Signed)
History and Physical Interval Note:  03/16/2020 8:43 AM  [name]  has presented today for surgery, with the diagnosis of accelerating angina.  The various methods of treatment have been discussed with the patient and family. After consideration of risks, benefits and other options for treatment, the patient has consented to  Procedure(s): LEFT HEART CATH AND CORONARY ANGIOGRAPHY (N/A) as a surgical intervention.  The patient's history has been reviewed, patient examined, no change in status, stable for surgery.  I have reviewed the patient's chart and labs.  Questions were answered to the patient's satisfaction.    Cath Lab Visit (complete for each Cath Lab visit)  Clinical Evaluation Leading to the Procedure:   ACS: No.  Non-ACS:    Anginal Classification: CCS III  Anti-ischemic medical therapy: No Therapy  Non-Invasive Test Results: No non-invasive testing performed  Prior CABG: No previous CABG  Cristin Szatkowski

## 2020-03-23 NOTE — Progress Notes (Signed)
Cardiology Office Note   Date:  03/25/2020   ID:  SHATERA RENNERT, DOB [birth year], MRN [id number]  PCP:  Georgann Housekeeper, MD  Cardiologist: Dr. Katrinka Blazing   Chief Complaint  Patient presents with   Follow-up    History of Present Illness: [name] is a 64 y.o. female who presents for post cath follow-up, seen for Dr. Katrinka Blazing.  [name] has a history of non-obstructive CAD, anxiety, depression, recurrent chest pain and prolonged QTC which has been felt to be secondary to hypokalemia.  [name] previously underwent LHC 10/29/2003 per Dr. Katrinka Blazing which essentially showed normal coronary arteries with mild plaquing in the mid LAD and distal RCA with a normal LVEF>>performed secondary to chest pain.  In 2018 [name] was admitted for dizziness and bradycardia with heart rates in the 40s to 50s. Echocardiogram showed normal LVEF with mild MR and moderate LAE.  Stress test showed moderate sized area of decreased myocardial activity in the mid and distal inferior walls with reversibility. Coronary CTA showed high calcium score of 848 and possible hemodynamically significant stenosis in the proximal, mid LAD. LHC performed 11/18/2016 which showed mild to moderate nonobstructive CAD with 40% mid LAD, 50% diagonal and 25% mid RCA disease.  Medical therapy was recommended.  [name] was placed on Crestor. Estrace was discontinued in the setting of CAD.  [name] was not placed on ASA given peptic ulcer disease. QTC at the time of admission was 526 ms in the setting of hypokalemia.  QT normalized with resolution of hypokalemia.  [name] was seen by EP who recommended further monitoring as an outpatient and avoidance of QT prolonging medications therefore Phenergan and Seroquel were discontinued.  [name] was then seen by Dr. Katrinka Blazing 05/19/2017 at which time he wished to see her back on an as-needed basis due to no CAD, normal monitor and no recurrent issues>> management per PCP going forward.  [name] was last seen by myself  at which time [name] had intermittent chest pressure that worsened on exertion and relieved with rest. [name] also was having issues with shortness of breath and LE edema.  [name] stated [name] was previously walking 3 to 4 miles per day with her dog and could no longer do activities due to overwhelming fatigue, shortness of breath and chest pain.   [name] was also having issues with labile BPs despite being only on lisinopril 2.5 mg. Both husband and wife stated they eat only home cooked meals and use [employer] for seasoning therefore very unlikely diet related.  [name] has been seen several times for PCP with no clear etiology.  Given this, he has referred her back to cardiology for further work-up given nonobstructive CAD.    LHC performed 03/16/2020 which showed mild to moderate, nonobstructive CAD similar to prior cardiac catheterization 11/2016 with grossly normal LV systolic function.  Plan was for continued risk factor modification and medical therapy to prevent progression of disease.  Today [name] presents for follow up and reports [name] feels the same. Her biggest complaint is overwhelming fatigue and weakness. [name] continues to have intermittent chest pain however we discussed her cath findings at length that showed no obstructive disease. [name] I sto see her PCP in a few weeks and plans to discuss her issues with him. BP is more stable and LE edema has improved. [name] is reassured that her issues are not related to coronary disease.   Past Medical History:  Diagnosis Date   Anemia    Anxiety  Arthritis    "all over" (11/16/2016)   Chronic lower back pain    Chronic pain syndrome    Chronic pain syndrome    "all over" (11/16/2016)   CKD (chronic kidney disease), stage III (HCC)    Daily headache    Depression    Fibromyalgia    GERD (gastroesophageal reflux disease)    High cholesterol    History of blood transfusion    "related to hand laceration"   History of hiatal hernia    History of  stomach ulcers    "bled when I was younger" (11/16/2016)   Hypertension    hx; "they took me off my RX" (11/16/2016)   Migraine 1970s   Phlebitis of leg, right    "years ago" (11/16/2016)    Past Surgical History:  Procedure Laterality Date   ABDOMINAL HYSTERECTOMY  1993   w/left ovary   ANKLE FRACTURE SURGERY Bilateral    APPENDECTOMY  1977   BACK SURGERY     CARDIAC CATHETERIZATION  2005   "negative"   CHOLECYSTECTOMY OPEN  1977   ESOPHAGOGASTRODUODENOSCOPY  2008   dx'd GERD   FOREARM FRACTURE SURGERY Right    HIP SURGERY Bilateral    "took out bone spurs"   JOINT REPLACEMENT     KNEE ARTHROSCOPY Bilateral    "total of 18 times"   LACERATION REPAIR Right    hand; "went thru glass door"   LEFT HEART CATH AND CORONARY ANGIOGRAPHY N/A 11/18/2016   Procedure: Left Heart Cath and Coronary Angiography;  Surgeon: Yvonne Kendall, MD;  Location: [facility];  Service: Cardiovascular;  Laterality: N/A;   LEFT HEART CATH AND CORONARY ANGIOGRAPHY N/A 03/16/2020   Procedure: LEFT HEART CATH AND CORONARY ANGIOGRAPHY;  Surgeon: Yvonne Kendall, MD;  Location: [facility];  Service: Cardiovascular;  Laterality: N/A;   LUMBAR FUSION     "put rods in"   LUMBAR LAMINECTOMY/DECOMPRESSION MICRODISCECTOMY  05/2006   OPEN REDUCTION INTERNAL FIXATION (ORIF) DISTAL RADIAL FRACTURE Left 06/18/2018   Procedure: OPEN REDUCTION INTERNAL FIXATION (ORIF) DISTAL RADIAL FRACTURE repair and/or reconstruction;  Surgeon: Bradly Bienenstock, MD;  Location: [facility] OR;  Service: Orthopedics;  Laterality: Left;   REVISION TOTAL KNEE ARTHROPLASTY Bilateral    RIGHT OOPHORECTOMY Right    SHOULDER OPEN ROTATOR CUFF REPAIR Bilateral    left 04/2006   TONSILLECTOMY     TOTAL KNEE ARTHROPLASTY Bilateral    left 1998; right 2004   TUBAL LIGATION       Current Outpatient Medications  Medication Sig Dispense Refill   Acetaminophen-Caffeine (EXCEDRIN ASPIRIN FREE PO) Take 2 tablets by  mouth 3 (three) times daily as needed (pain).      albuterol (VENTOLIN HFA) 108 (90 Base) MCG/ACT inhaler Inhale 2 puffs into the lungs every 4 (four) hours as needed for wheezing or shortness of breath.      ALPRAZolam (XANAX) 1 MG tablet Take 0.5 mg by mouth 3 (three) times daily as needed for anxiety.      Alum & Mag Hydroxide-Simeth (MAALOX ADVANCED PO) Take 15 mLs by mouth 3 (three) times daily as needed (acid reflux).      aspirin EC 81 MG tablet Take 1 tablet (81 mg total) by mouth daily. Swallow whole. 90 tablet 3   benzonatate (TESSALON) 200 MG capsule Take 200 mg by mouth at bedtime as needed for cough.      cyclobenzaprine (FLEXERIL) 10 MG tablet Take 10 mg by mouth at bedtime.  DULoxetine (CYMBALTA) 30 MG capsule Take 30 mg by mouth at bedtime.      famotidine (PEPCID) 20 MG tablet Take 20 mg by mouth 2 (two) times daily.      FLUoxetine (PROZAC) 20 MG capsule Take 20 mg by mouth in the morning, at noon, and at bedtime.      furosemide (LASIX) 20 MG tablet Take 10-20 mg by mouth daily as needed for fluid or edema.      gabapentin (NEURONTIN) 300 MG capsule Take 300 mg by mouth 3 (three) times daily.     HYDROcodone-acetaminophen (NORCO/VICODIN) 5-325 MG tablet Take 1 tablet by mouth 3 (three) times daily as needed for moderate pain.      ketoconazole (NIZORAL) 2 % cream Apply 1 application topically daily as needed (Rash).      lisinopril (ZESTRIL) 2.5 MG tablet Take 2.5 mg by mouth daily.     meloxicam (MOBIC) 15 MG tablet Take 15 mg by mouth daily.      nitroGLYCERIN (NITROSTAT) 0.4 MG SL tablet Place 1 tablet (0.4 mg total) under the tongue every 5 (five) minutes x 3 doses as needed for chest pain. 25 tablet 2   omeprazole (PRILOSEC) 20 MG capsule Take 20 mg by mouth 2 (two) times daily.     potassium chloride (MICRO-K) 10 MEQ CR capsule Take 20 mEq by mouth 2 (two) times daily.      promethazine (PHENERGAN) 25 MG tablet Take 12.5 mg by mouth every 8 (eight)  hours as needed for nausea or vomiting.      rosuvastatin (CRESTOR) 20 MG tablet TAKE 1 TABLET BY MOUTH ONCE DAILY . APPOINTMENT REQUIRED FOR FUTURE REFILLS 30 tablet 0   sucralfate (CARAFATE) 1 g tablet Take 1 g by mouth in the morning, at noon, and at bedtime.      tiZANidine (ZANAFLEX) 4 MG tablet Take 4 mg by mouth 2 (two) times daily as needed for muscle spasms.      triamcinolone cream (KENALOG) 0.1 % Apply 1 application topically 2 (two) times daily.      No current facility-administered medications for this visit.    Allergies:   Aspirin, Oxybutynin, and Shellfish allergy    Social History:  The patient  reports that [name] has never smoked. [name] has never used smokeless tobacco. [name] reports that [name] does not drink alcohol and does not use drugs.   Family History:  The patient's family history includes CAD (age of onset: 15) in her father; COPD in her mother; Diabetes in her sister; Emphysema in her mother; Iron deficiency in her sister; Lung cancer in her father; Other in her sister.    ROS:  Please see the history of present illness. Otherwise, review of systems are positive for none.   All other systems are reviewed and negative.   PHYSICAL EXAM: VS:  BP 130/80    Pulse 67    Ht  (1.6 m)    Wt 214 lb (97.1 kg)    SpO2 97%    BMI 37.91 kg/m  , BMI Body mass index is 37.91 kg/m.  General: Well developed, well nourished, NAD Lungs:Clear to ausculation bilaterally. No wheezes, rales, or rhonchi. Breathing is unlabored. Cardiovascular: RRR with S1 S2. No murmurs MSK: Strength and tone appear normal for age. 5/5 in all extremities Extremities: No edema. Radial pulses 2+ bilaterally Neuro: Alert and oriented. No focal deficits. No facial asymmetry. MAE spontaneously. Psych: Responds to questions appropriately with normal affect.    EKG:  EKG is not ordered today.  Recent Labs: 03/13/2020: BUN 19; Creatinine, Ser 1.09; Hemoglobin 12.0; Platelets 196; Potassium 4.8; Sodium  141    Lipid Panel    Component Value Date/Time   CHOL 130 11/17/2016 0413   TRIG 215 (H) 11/17/2016 0413   HDL 41 11/17/2016 0413   CHOLHDL 3.2 11/17/2016 0413   VLDL 43 (H) 11/17/2016 0413   LDLCALC 46 11/17/2016 0413      Wt Readings from Last 3 Encounters:  03/25/20 214 lb (97.1 kg)  03/16/20 210 lb (95.3 kg)  03/10/20 213 lb (96.6 kg)    Other studies Reviewed: Additional studies/ records that were reviewed today include:  Review of the above records demonstrates:   [facility] 03/16/2020:  Conclusions: 1. Mild to moderate, non-obstructive coronary artery disease, similar to prior catheterization in 11/2016. 2. Grossly normal left ventricular systolic function with mildly elevated filling pressure. 3. Tortuous right subclavian artery; consider use of long sheath or alternative access for future catheterizations.  Recommendations: 1. Continue risk factor modification and medical therapy to prevent progression of disease.   LHC 11/18/2016:  Conclusions: 1. Mild to moderate, nonobstructive coronary artery disease as detailed below. 2. Normal left ventricular filling pressure.  Recommendations: 1. Medical therapy and risk factor modification to prevent progression of coronary artery disease. 2. We will discontinue standing furosemide, given the low normal left ventricular filling pressure in the setting of normal LV systolic function.  Diagnostic Dominance: Right  Intervention   Echocardiogram 11/17/2016:  Study Conclusions   - Left ventricle: The cavity size was normal. Wall thickness was  normal. Systolic function was normal. The estimated ejection  fraction was in the range of 60% to 65%.  - Mitral valve: There was mild regurgitation.  - Left atrium: The atrium was moderately dilated.   ASSESSMENT AND PLAN:  1.  Nonobstructive CAD: -Patient has a history of nonobstructive CAD with last cardiac catheterization in 2018 which showed a 40% mid LAD, 50%  diagonal, and 25% proximal mid RCA lesions with recommendations to treat medically -LHC performed 03/16/2020 which showed mild to moderate, nonobstructive CAD similar to prior cardiac catheterization 11/2016 with grossly normal LV systolic function.  Plan for continued risk factor modification and medical therapy to prevent progression of disease. -Continues to have intermittent symptoms and feels overwhelming fatigue with most activities. We discussed heart cath at length and plans are now to follow with PCP next month for further workup -Given her fatigue, we will draw a TSH and Free T4  2.  DOE with fatigue: -Patient reports that several months ago, [name] was able to walk 3 to 4 miles with her dog without any issues however [name] no longer can perform this task secondary to overwhelming fatigue, shortness of breath and chest pressure -Last echocardiogram from 2018 with normal LV function.   -LHC performed 03/16/20 due to chest pain symptoms found to have minimal CAD and normal LV function and mildly elevtaed filling pressures -Obtain TSH with Free T4>>follow with PCP  3.  Labile blood pressures: -Improved  -Continue to stay well-hydrated  4.  History of HLD: -Followed by PCP -Continue Crestor  5.  History of prolonged QT interval: -QTC today at   Current medicines are reviewed at length with the patient today.  The patient does not have concerns regarding medicines.  The following changes have been made:  no change  Labs/ tests ordered today include: TSH, Free T4   Orders Placed This Encounter  Procedures   TSH  T4, free   Disposition:   FU with Dr. Ival BibleSmtih in 3 months  Signed, Georgie ChardJill Andriy Sherk, NP  03/25/2020 3:44 PM    [facility] [address], [place], [place]  [address] Phone: [phone]; Fax: [phone]  No new bruits

## 2020-03-25 ENCOUNTER — Other Ambulatory Visit: Payer: Self-pay

## 2020-03-25 ENCOUNTER — Encounter: Payer: Self-pay | Admitting: Cardiology

## 2020-03-25 ENCOUNTER — Ambulatory Visit (INDEPENDENT_AMBULATORY_CARE_PROVIDER_SITE_OTHER): Payer: Medicare Other | Admitting: Cardiology

## 2020-03-25 VITALS — BP 130/80 | HR 67 | Ht 63.0 in | Wt 214.0 lb

## 2020-03-25 DIAGNOSIS — R5383 Other fatigue: Secondary | ICD-10-CM

## 2020-03-25 DIAGNOSIS — I10 Essential (primary) hypertension: Secondary | ICD-10-CM

## 2020-03-25 DIAGNOSIS — E7800 Pure hypercholesterolemia, unspecified: Secondary | ICD-10-CM

## 2020-03-25 DIAGNOSIS — I25119 Atherosclerotic heart disease of native coronary artery with unspecified angina pectoris: Secondary | ICD-10-CM | POA: Diagnosis not present

## 2020-03-25 NOTE — Patient Instructions (Signed)
Medication Instructions:  Your physician recommends that you continue on your current medications as directed. Please refer to the Current Medication list given to you today.  *If you need a refill on your cardiac medications before your next appointment, please call your pharmacy*   Lab Work: TODAY: TSH, FREE T4 If you have labs (blood work) drawn today and your tests are completely normal, you will receive your results only by: Marland Kitchen MyChart Message (if you have MyChart) OR . A paper copy in the mail If you have any lab test that is abnormal or we need to change your treatment, we will call you to review the results.   Testing/Procedures: NONE   Follow-Up: At [facility], you and your health needs are our priority.  As part of our continuing mission to provide you with exceptional heart care, we have created designated Provider Care Teams.  These Care Teams include your primary Cardiologist (physician) and Advanced Practice Providers (APPs -  Physician Assistants and Nurse Practitioners) who all work together to provide you with the care you need, when you need it.  We recommend signing up for the patient portal called "MyChart".  Sign up information is provided on this After Visit Summary.  MyChart is used to connect with patients for Virtual Visits (Telemedicine).  Patients are able to view lab/test results, encounter notes, upcoming appointments, etc.  Non-urgent messages can be sent to your provider as well.   To learn more about what you can do with MyChart, go to [email or web address].    Your next appointment:   3 month(s)  The format for your next appointment:   In Person  Provider:   You may see Lesleigh Noe, MD or one of the following Advanced Practice Providers on your designated Care Team:    Norma Fredrickson, NP  Nada Boozer, NP  Georgie Chard, NP

## 2020-03-26 LAB — TSH: TSH: 2.29 u[IU]/mL (ref 0.450–4.500)

## 2020-03-26 LAB — T4, FREE: Free T4: 0.68 ng/dL — ABNORMAL LOW (ref 0.82–1.77)

## 2020-04-01 ENCOUNTER — Other Ambulatory Visit (HOSPITAL_COMMUNITY): Payer: Medicare Other

## 2020-04-13 ENCOUNTER — Ambulatory Visit
Admission: RE | Admit: 2020-04-13 | Discharge: 2020-04-13 | Disposition: A | Discharge status: Home / Self Care | Payer: Medicare Other | Source: Ambulatory Visit | Attending: Internal Medicine | Admitting: Internal Medicine

## 2020-04-13 ENCOUNTER — Other Ambulatory Visit: Payer: Self-pay

## 2020-04-13 DIAGNOSIS — E2839 Other primary ovarian failure: Secondary | ICD-10-CM

## 2020-04-13 DIAGNOSIS — Z1231 Encounter for screening mammogram for malignant neoplasm of breast: Secondary | ICD-10-CM

## 2020-05-14 DIAGNOSIS — M545 Low back pain, unspecified: Secondary | ICD-10-CM | POA: Diagnosis not present

## 2020-05-14 DIAGNOSIS — M5459 Other low back pain: Secondary | ICD-10-CM | POA: Diagnosis not present

## 2020-05-14 DIAGNOSIS — M25552 Pain in left hip: Secondary | ICD-10-CM | POA: Diagnosis not present

## 2020-06-08 DIAGNOSIS — E782 Mixed hyperlipidemia: Secondary | ICD-10-CM | POA: Diagnosis not present

## 2020-06-08 DIAGNOSIS — M8580 Other specified disorders of bone density and structure, unspecified site: Secondary | ICD-10-CM | POA: Diagnosis not present

## 2020-06-08 DIAGNOSIS — I2510 Atherosclerotic heart disease of native coronary artery without angina pectoris: Secondary | ICD-10-CM | POA: Diagnosis not present

## 2020-06-08 DIAGNOSIS — K219 Gastro-esophageal reflux disease without esophagitis: Secondary | ICD-10-CM | POA: Diagnosis not present

## 2020-06-08 DIAGNOSIS — M5416 Radiculopathy, lumbar region: Secondary | ICD-10-CM | POA: Diagnosis not present

## 2020-06-08 DIAGNOSIS — M25552 Pain in left hip: Secondary | ICD-10-CM | POA: Diagnosis not present

## 2020-06-08 DIAGNOSIS — N183 Chronic kidney disease, stage 3 unspecified: Secondary | ICD-10-CM | POA: Diagnosis not present

## 2020-06-08 DIAGNOSIS — M1990 Unspecified osteoarthritis, unspecified site: Secondary | ICD-10-CM | POA: Diagnosis not present

## 2020-06-08 DIAGNOSIS — I10 Essential (primary) hypertension: Secondary | ICD-10-CM | POA: Diagnosis not present

## 2020-06-19 DIAGNOSIS — M25552 Pain in left hip: Secondary | ICD-10-CM | POA: Diagnosis not present

## 2020-06-19 DIAGNOSIS — M5459 Other low back pain: Secondary | ICD-10-CM | POA: Diagnosis not present

## 2020-06-24 ENCOUNTER — Ambulatory Visit: Payer: Medicare Other | Admitting: Cardiology

## 2020-07-01 DIAGNOSIS — M7072 Other bursitis of hip, left hip: Secondary | ICD-10-CM | POA: Diagnosis not present

## 2020-07-14 DIAGNOSIS — M5416 Radiculopathy, lumbar region: Secondary | ICD-10-CM | POA: Diagnosis not present

## 2020-07-14 DIAGNOSIS — M961 Postlaminectomy syndrome, not elsewhere classified: Secondary | ICD-10-CM | POA: Diagnosis not present

## 2020-08-04 DIAGNOSIS — M25552 Pain in left hip: Secondary | ICD-10-CM | POA: Diagnosis not present

## 2020-08-04 DIAGNOSIS — M5459 Other low back pain: Secondary | ICD-10-CM | POA: Diagnosis not present

## 2020-08-14 DIAGNOSIS — M961 Postlaminectomy syndrome, not elsewhere classified: Secondary | ICD-10-CM | POA: Diagnosis present

## 2020-08-25 DIAGNOSIS — M5416 Radiculopathy, lumbar region: Secondary | ICD-10-CM | POA: Diagnosis not present

## 2020-08-28 DIAGNOSIS — D501 Sideropenic dysphagia: Secondary | ICD-10-CM | POA: Diagnosis not present

## 2020-08-28 DIAGNOSIS — I2510 Atherosclerotic heart disease of native coronary artery without angina pectoris: Secondary | ICD-10-CM | POA: Diagnosis not present

## 2020-08-28 DIAGNOSIS — N183 Chronic kidney disease, stage 3 unspecified: Secondary | ICD-10-CM | POA: Diagnosis not present

## 2020-08-28 DIAGNOSIS — E782 Mixed hyperlipidemia: Secondary | ICD-10-CM | POA: Diagnosis not present

## 2020-08-28 DIAGNOSIS — M1990 Unspecified osteoarthritis, unspecified site: Secondary | ICD-10-CM | POA: Diagnosis not present

## 2020-08-28 DIAGNOSIS — G8929 Other chronic pain: Secondary | ICD-10-CM | POA: Diagnosis not present

## 2020-08-28 DIAGNOSIS — M8580 Other specified disorders of bone density and structure, unspecified site: Secondary | ICD-10-CM | POA: Diagnosis not present

## 2020-08-28 DIAGNOSIS — K219 Gastro-esophageal reflux disease without esophagitis: Secondary | ICD-10-CM | POA: Diagnosis not present

## 2020-08-28 DIAGNOSIS — I10 Essential (primary) hypertension: Secondary | ICD-10-CM | POA: Diagnosis not present

## 2020-09-08 DIAGNOSIS — M5416 Radiculopathy, lumbar region: Secondary | ICD-10-CM | POA: Diagnosis not present

## 2020-09-08 DIAGNOSIS — M961 Postlaminectomy syndrome, not elsewhere classified: Secondary | ICD-10-CM | POA: Diagnosis not present

## 2020-09-29 DIAGNOSIS — M5416 Radiculopathy, lumbar region: Secondary | ICD-10-CM | POA: Diagnosis not present

## 2020-10-06 DIAGNOSIS — I10 Essential (primary) hypertension: Secondary | ICD-10-CM | POA: Diagnosis not present

## 2020-10-06 DIAGNOSIS — G8929 Other chronic pain: Secondary | ICD-10-CM | POA: Diagnosis not present

## 2020-10-06 DIAGNOSIS — M1990 Unspecified osteoarthritis, unspecified site: Secondary | ICD-10-CM | POA: Diagnosis not present

## 2020-10-06 DIAGNOSIS — D501 Sideropenic dysphagia: Secondary | ICD-10-CM | POA: Diagnosis not present

## 2020-10-06 DIAGNOSIS — D508 Other iron deficiency anemias: Secondary | ICD-10-CM | POA: Diagnosis not present

## 2020-10-06 DIAGNOSIS — N183 Chronic kidney disease, stage 3 unspecified: Secondary | ICD-10-CM | POA: Diagnosis not present

## 2020-10-06 DIAGNOSIS — K219 Gastro-esophageal reflux disease without esophagitis: Secondary | ICD-10-CM | POA: Diagnosis not present

## 2020-10-06 DIAGNOSIS — E782 Mixed hyperlipidemia: Secondary | ICD-10-CM | POA: Diagnosis not present

## 2020-10-06 DIAGNOSIS — I2510 Atherosclerotic heart disease of native coronary artery without angina pectoris: Secondary | ICD-10-CM | POA: Diagnosis not present

## 2020-10-06 DIAGNOSIS — M8580 Other specified disorders of bone density and structure, unspecified site: Secondary | ICD-10-CM | POA: Diagnosis not present

## 2020-10-27 DIAGNOSIS — M5416 Radiculopathy, lumbar region: Secondary | ICD-10-CM | POA: Diagnosis not present

## 2020-11-02 DIAGNOSIS — Z0000 Encounter for general adult medical examination without abnormal findings: Secondary | ICD-10-CM | POA: Diagnosis not present

## 2020-11-02 DIAGNOSIS — M5136 Other intervertebral disc degeneration, lumbar region: Secondary | ICD-10-CM | POA: Diagnosis not present

## 2020-11-02 DIAGNOSIS — I10 Essential (primary) hypertension: Secondary | ICD-10-CM | POA: Diagnosis not present

## 2020-11-02 DIAGNOSIS — G894 Chronic pain syndrome: Secondary | ICD-10-CM | POA: Diagnosis not present

## 2020-11-02 DIAGNOSIS — I2510 Atherosclerotic heart disease of native coronary artery without angina pectoris: Secondary | ICD-10-CM | POA: Diagnosis not present

## 2020-11-02 DIAGNOSIS — M109 Gout, unspecified: Secondary | ICD-10-CM | POA: Diagnosis not present

## 2020-11-02 DIAGNOSIS — K219 Gastro-esophageal reflux disease without esophagitis: Secondary | ICD-10-CM | POA: Diagnosis not present

## 2020-11-02 DIAGNOSIS — Z1389 Encounter for screening for other disorder: Secondary | ICD-10-CM | POA: Diagnosis not present

## 2020-11-02 DIAGNOSIS — M797 Fibromyalgia: Secondary | ICD-10-CM | POA: Diagnosis not present

## 2020-11-02 DIAGNOSIS — M8580 Other specified disorders of bone density and structure, unspecified site: Secondary | ICD-10-CM | POA: Diagnosis not present

## 2020-11-02 DIAGNOSIS — R7303 Prediabetes: Secondary | ICD-10-CM | POA: Diagnosis not present

## 2020-11-24 DIAGNOSIS — M961 Postlaminectomy syndrome, not elsewhere classified: Secondary | ICD-10-CM | POA: Diagnosis not present

## 2020-11-26 DIAGNOSIS — I10 Essential (primary) hypertension: Secondary | ICD-10-CM | POA: Diagnosis not present

## 2020-11-26 DIAGNOSIS — M8580 Other specified disorders of bone density and structure, unspecified site: Secondary | ICD-10-CM | POA: Diagnosis not present

## 2020-11-26 DIAGNOSIS — D508 Other iron deficiency anemias: Secondary | ICD-10-CM | POA: Diagnosis not present

## 2020-11-26 DIAGNOSIS — E782 Mixed hyperlipidemia: Secondary | ICD-10-CM | POA: Diagnosis not present

## 2020-11-26 DIAGNOSIS — M1990 Unspecified osteoarthritis, unspecified site: Secondary | ICD-10-CM | POA: Diagnosis not present

## 2020-11-26 DIAGNOSIS — I2510 Atherosclerotic heart disease of native coronary artery without angina pectoris: Secondary | ICD-10-CM | POA: Diagnosis not present

## 2020-11-26 DIAGNOSIS — G8929 Other chronic pain: Secondary | ICD-10-CM | POA: Diagnosis not present

## 2020-11-26 DIAGNOSIS — K219 Gastro-esophageal reflux disease without esophagitis: Secondary | ICD-10-CM | POA: Diagnosis not present

## 2020-11-26 DIAGNOSIS — N183 Chronic kidney disease, stage 3 unspecified: Secondary | ICD-10-CM | POA: Diagnosis not present

## 2020-11-26 DIAGNOSIS — D501 Sideropenic dysphagia: Secondary | ICD-10-CM | POA: Diagnosis not present

## 2020-12-15 DIAGNOSIS — M5416 Radiculopathy, lumbar region: Secondary | ICD-10-CM | POA: Diagnosis not present

## 2020-12-24 DIAGNOSIS — G8929 Other chronic pain: Secondary | ICD-10-CM | POA: Diagnosis not present

## 2020-12-24 DIAGNOSIS — M1990 Unspecified osteoarthritis, unspecified site: Secondary | ICD-10-CM | POA: Diagnosis not present

## 2020-12-24 DIAGNOSIS — I10 Essential (primary) hypertension: Secondary | ICD-10-CM | POA: Diagnosis not present

## 2020-12-24 DIAGNOSIS — K219 Gastro-esophageal reflux disease without esophagitis: Secondary | ICD-10-CM | POA: Diagnosis not present

## 2020-12-24 DIAGNOSIS — M8580 Other specified disorders of bone density and structure, unspecified site: Secondary | ICD-10-CM | POA: Diagnosis not present

## 2020-12-24 DIAGNOSIS — E782 Mixed hyperlipidemia: Secondary | ICD-10-CM | POA: Diagnosis not present

## 2020-12-24 DIAGNOSIS — N183 Chronic kidney disease, stage 3 unspecified: Secondary | ICD-10-CM | POA: Diagnosis not present

## 2020-12-24 DIAGNOSIS — I2510 Atherosclerotic heart disease of native coronary artery without angina pectoris: Secondary | ICD-10-CM | POA: Diagnosis not present

## 2020-12-24 DIAGNOSIS — D501 Sideropenic dysphagia: Secondary | ICD-10-CM | POA: Diagnosis not present

## 2020-12-28 DIAGNOSIS — M961 Postlaminectomy syndrome, not elsewhere classified: Secondary | ICD-10-CM | POA: Diagnosis not present

## 2020-12-28 DIAGNOSIS — M5416 Radiculopathy, lumbar region: Secondary | ICD-10-CM | POA: Diagnosis not present

## 2020-12-28 DIAGNOSIS — M62830 Muscle spasm of back: Secondary | ICD-10-CM | POA: Diagnosis not present

## 2021-01-05 DIAGNOSIS — M545 Low back pain, unspecified: Secondary | ICD-10-CM | POA: Diagnosis not present

## 2021-01-07 DIAGNOSIS — G894 Chronic pain syndrome: Secondary | ICD-10-CM | POA: Diagnosis not present

## 2021-01-07 DIAGNOSIS — M5416 Radiculopathy, lumbar region: Secondary | ICD-10-CM | POA: Diagnosis not present

## 2021-02-09 DIAGNOSIS — M5416 Radiculopathy, lumbar region: Secondary | ICD-10-CM | POA: Diagnosis not present

## 2021-03-10 ENCOUNTER — Other Ambulatory Visit: Payer: Self-pay | Admitting: Internal Medicine

## 2021-03-10 DIAGNOSIS — Z1231 Encounter for screening mammogram for malignant neoplasm of breast: Secondary | ICD-10-CM

## 2021-03-15 DIAGNOSIS — R509 Fever, unspecified: Secondary | ICD-10-CM | POA: Diagnosis not present

## 2021-03-15 DIAGNOSIS — R051 Acute cough: Secondary | ICD-10-CM | POA: Diagnosis not present

## 2021-03-30 DIAGNOSIS — M25561 Pain in right knee: Secondary | ICD-10-CM | POA: Diagnosis not present

## 2021-04-06 DIAGNOSIS — M1990 Unspecified osteoarthritis, unspecified site: Secondary | ICD-10-CM | POA: Diagnosis not present

## 2021-04-06 DIAGNOSIS — M8580 Other specified disorders of bone density and structure, unspecified site: Secondary | ICD-10-CM | POA: Diagnosis not present

## 2021-04-06 DIAGNOSIS — D501 Sideropenic dysphagia: Secondary | ICD-10-CM | POA: Diagnosis not present

## 2021-04-06 DIAGNOSIS — I2510 Atherosclerotic heart disease of native coronary artery without angina pectoris: Secondary | ICD-10-CM | POA: Diagnosis not present

## 2021-04-06 DIAGNOSIS — E782 Mixed hyperlipidemia: Secondary | ICD-10-CM | POA: Diagnosis not present

## 2021-04-06 DIAGNOSIS — K219 Gastro-esophageal reflux disease without esophagitis: Secondary | ICD-10-CM | POA: Diagnosis not present

## 2021-04-06 DIAGNOSIS — N183 Chronic kidney disease, stage 3 unspecified: Secondary | ICD-10-CM | POA: Diagnosis not present

## 2021-04-06 DIAGNOSIS — G8929 Other chronic pain: Secondary | ICD-10-CM | POA: Diagnosis not present

## 2021-04-06 DIAGNOSIS — I10 Essential (primary) hypertension: Secondary | ICD-10-CM | POA: Diagnosis not present

## 2021-04-14 ENCOUNTER — Ambulatory Visit: Payer: Medicare Other

## 2021-04-14 DIAGNOSIS — M6281 Muscle weakness (generalized): Secondary | ICD-10-CM | POA: Diagnosis not present

## 2021-04-14 DIAGNOSIS — M961 Postlaminectomy syndrome, not elsewhere classified: Secondary | ICD-10-CM | POA: Diagnosis not present

## 2021-04-14 DIAGNOSIS — M5416 Radiculopathy, lumbar region: Secondary | ICD-10-CM | POA: Diagnosis not present

## 2021-05-17 ENCOUNTER — Ambulatory Visit
Admission: RE | Admit: 2021-05-17 | Discharge: 2021-05-17 | Disposition: A | Discharge status: Home / Self Care | Payer: Medicare Other | Source: Ambulatory Visit | Attending: Internal Medicine | Admitting: Internal Medicine

## 2021-05-17 DIAGNOSIS — Z1231 Encounter for screening mammogram for malignant neoplasm of breast: Secondary | ICD-10-CM

## 2021-06-02 DIAGNOSIS — M8580 Other specified disorders of bone density and structure, unspecified site: Secondary | ICD-10-CM | POA: Diagnosis not present

## 2021-06-02 DIAGNOSIS — G8929 Other chronic pain: Secondary | ICD-10-CM | POA: Diagnosis not present

## 2021-06-02 DIAGNOSIS — M1990 Unspecified osteoarthritis, unspecified site: Secondary | ICD-10-CM | POA: Diagnosis not present

## 2021-06-02 DIAGNOSIS — I10 Essential (primary) hypertension: Secondary | ICD-10-CM | POA: Diagnosis not present

## 2021-06-02 DIAGNOSIS — E782 Mixed hyperlipidemia: Secondary | ICD-10-CM | POA: Diagnosis not present

## 2021-06-02 DIAGNOSIS — N183 Chronic kidney disease, stage 3 unspecified: Secondary | ICD-10-CM | POA: Diagnosis not present

## 2021-06-02 DIAGNOSIS — I2510 Atherosclerotic heart disease of native coronary artery without angina pectoris: Secondary | ICD-10-CM | POA: Diagnosis not present

## 2021-06-02 DIAGNOSIS — K219 Gastro-esophageal reflux disease without esophagitis: Secondary | ICD-10-CM | POA: Diagnosis not present

## 2021-06-02 DIAGNOSIS — D508 Other iron deficiency anemias: Secondary | ICD-10-CM | POA: Diagnosis not present

## 2021-06-09 ENCOUNTER — Encounter: Payer: Self-pay | Admitting: *Deleted

## 2021-06-09 DIAGNOSIS — E782 Mixed hyperlipidemia: Secondary | ICD-10-CM | POA: Diagnosis not present

## 2021-06-09 DIAGNOSIS — I10 Essential (primary) hypertension: Secondary | ICD-10-CM | POA: Diagnosis not present

## 2021-06-09 DIAGNOSIS — M797 Fibromyalgia: Secondary | ICD-10-CM | POA: Diagnosis not present

## 2021-06-09 DIAGNOSIS — G894 Chronic pain syndrome: Secondary | ICD-10-CM | POA: Diagnosis not present

## 2021-06-09 DIAGNOSIS — I2510 Atherosclerotic heart disease of native coronary artery without angina pectoris: Secondary | ICD-10-CM | POA: Diagnosis not present

## 2021-06-09 DIAGNOSIS — Z23 Encounter for immunization: Secondary | ICD-10-CM | POA: Diagnosis not present

## 2021-06-09 DIAGNOSIS — R413 Other amnesia: Secondary | ICD-10-CM | POA: Diagnosis not present

## 2021-06-09 DIAGNOSIS — N183 Chronic kidney disease, stage 3 unspecified: Secondary | ICD-10-CM | POA: Diagnosis not present

## 2021-06-14 ENCOUNTER — Encounter: Payer: Self-pay | Admitting: Diagnostic Neuroimaging

## 2021-06-14 ENCOUNTER — Ambulatory Visit: Payer: Medicare Other | Admitting: Diagnostic Neuroimaging

## 2021-06-14 VITALS — BP 132/90 | HR 70 | Ht 63.0 in | Wt 223.4 lb

## 2021-06-14 DIAGNOSIS — G894 Chronic pain syndrome: Secondary | ICD-10-CM

## 2021-06-14 DIAGNOSIS — M545 Low back pain, unspecified: Secondary | ICD-10-CM | POA: Diagnosis not present

## 2021-06-14 DIAGNOSIS — M542 Cervicalgia: Secondary | ICD-10-CM | POA: Diagnosis not present

## 2021-06-14 DIAGNOSIS — G8929 Other chronic pain: Secondary | ICD-10-CM

## 2021-06-14 NOTE — Progress Notes (Signed)
[facility]  PATIENT: ELLANI BILBRO DOB: [birth year]  REFERRING CLINICIAN: Suella Broad, MD  HISTORY FROM: patient and friend REASON FOR VISIT: follow up   HISTORICAL  CHIEF COMPLAINT:  Chief Complaint  Patient presents with   New Patient (Initial Visit)    Rm 6 partner Darnell Level) here for consult on muscle weakness prior visit in 2020 for chronic pain. Patient reports muscle weakness has been on going for a few months now associated pain has been present as well.     HISTORY OF PRESENT ILLNESS:   UPDATE (06/14/21, VRP): Since last visit, doing poorly. More pain and weakness issues. More issues with anxiety and insomnia. Still with whole body pain, numbness, pain issues. On good news, she is walking up to 10000 steps day and using elliptical machine.   PRIOR HPI (06/05/18): 66 year old female here for evaluation of numbness, tingling, pain.  Patient has long history of chronic pain.  In her 48s she had unexplained stomach pain.  She underwent cholecystectomy without relief.  Patient also has long history of depression anxiety since her 60s.  She was on treatment with psychiatry and psychology in the past but has not seen anyone since the 1990s.  Her PCP currently treats these with medication.  Patient also has had diagnosis of fibromyalgia.  She has chronic pain in her knees, neck and back.  She is underwent multiple surgeries including bilateral knee replacements, rotator cuff surgeries, hip surgeries.  Her last major surgery was lumbar spine surgery in 2008.   6 months ago, perhaps 2 years ago, patient had increasing numbness and pain in her hands, feet, legs and body.  She was evaluated by her neurosurgeon Dr. Carloyn Manner, who ordered MRI scans and recommended additional lumbar spine surgery.  He did not want to proceed surgical treatment.  She is been treated with epidural steroid which have helped a little bit.  Patient is frustrated.  Her depression anxiety are  worse.  Her balance and gait are poor.  She is afraid of falling down.  Memory loss is getting worse.   REVIEW OF SYSTEMS: Full 14 system review of systems performed and negative with exception of: as per HPI.   ALLERGIES: Allergies  Allergen Reactions   Aspirin Nausea Only    Other reaction(s): stomach upset   Oxybutynin Rash   Shellfish Allergy Swelling and Rash    HOME MEDICATIONS: Outpatient Medications Prior to Visit  Medication Sig Dispense Refill   Acetaminophen-Caffeine (EXCEDRIN ASPIRIN FREE PO) Take 2 tablets by mouth 3 (three) times daily as needed (pain).      albuterol (VENTOLIN HFA) 108 (90 Base) MCG/ACT inhaler Inhale 2 puffs into the lungs every 4 (four) hours as needed for wheezing or shortness of breath.      ALPRAZolam (XANAX) 1 MG tablet Take 0.5 mg by mouth 3 (three) times daily as needed for anxiety.      aspirin EC 81 MG tablet Take 1 tablet (81 mg total) by mouth daily. Swallow whole. 90 tablet 3   famotidine (PEPCID) 20 MG tablet Take 20 mg by mouth 2 (two) times daily.      FLUoxetine (PROZAC) 20 MG capsule Take 20 mg by mouth in the morning, at noon, and at bedtime.      furosemide (LASIX) 20 MG tablet Take 10-20 mg by mouth daily as needed for fluid or edema.      gabapentin (NEURONTIN) 300 MG capsule Take 300 mg by mouth 3 (three) times daily.  HYDROcodone-acetaminophen (NORCO/VICODIN) 5-325 MG tablet Take 1 tablet by mouth 3 (three) times daily as needed for moderate pain.      ketoconazole (NIZORAL) 2 % cream Apply 1 application topically daily as needed (Rash).      lisinopril (ZESTRIL) 2.5 MG tablet Take 2.5 mg by mouth daily.     meloxicam (MOBIC) 15 MG tablet Take 15 mg by mouth daily.      nitroGLYCERIN (NITROSTAT) 0.4 MG SL tablet Place 1 tablet (0.4 mg total) under the tongue every 5 (five) minutes x 3 doses as needed for chest pain. 25 tablet 2   omeprazole (PRILOSEC) 20 MG capsule Take 20 mg by mouth 2 (two) times daily.     promethazine  (PHENERGAN) 25 MG tablet Take 12.5 mg by mouth every 8 (eight) hours as needed for nausea or vomiting.      rosuvastatin (CRESTOR) 20 MG tablet TAKE 1 TABLET BY MOUTH ONCE DAILY . APPOINTMENT REQUIRED FOR FUTURE REFILLS 30 tablet 0   tiZANidine (ZANAFLEX) 4 MG tablet Take 4 mg by mouth 2 (two) times daily as needed for muscle spasms.      triamcinolone cream (KENALOG) 0.1 % Apply 1 application topically 2 (two) times daily.      Alum & Mag Hydroxide-Simeth (MAALOX ADVANCED PO) Take 15 mLs by mouth 3 (three) times daily as needed (acid reflux).      benzonatate (TESSALON) 200 MG capsule Take 200 mg by mouth at bedtime as needed for cough.  (Patient not taking: Reported on 06/14/2021)     cyclobenzaprine (FLEXERIL) 10 MG tablet Take 10 mg by mouth at bedtime.      DULoxetine (CYMBALTA) 30 MG capsule Take 30 mg by mouth at bedtime.      potassium chloride (MICRO-K) 10 MEQ CR capsule Take 20 mEq by mouth 2 (two) times daily.      sucralfate (CARAFATE) 1 g tablet Take 1 g by mouth in the morning, at noon, and at bedtime.      No facility-administered medications prior to visit.    PAST MEDICAL HISTORY: Past Medical History:  Diagnosis Date   Anemia    Anxiety    Arthritis    "all over" (11/16/2016)   CAD (coronary artery disease)    Chronic lower back pain    Chronic pain syndrome    Chronic pain syndrome    "all over" (11/16/2016)   CKD (chronic kidney disease), stage III (HCC)    Daily headache    Depression    Fibromyalgia    GERD (gastroesophageal reflux disease)    High cholesterol    History of blood transfusion    "related to hand laceration"   History of hiatal hernia    History of stomach ulcers    "bled when I was younger" (11/16/2016)   Hypercholesterolemia    Hypertension    hx; "they took me off my RX" (11/16/2016)   Migraine 1970s   Phlebitis of leg, right    "years ago" (11/16/2016)   Radiculopathy     PAST SURGICAL HISTORY: Past Surgical History:  Procedure  Laterality Date   ABDOMINAL HYSTERECTOMY  1993   w/left ovary   ANKLE FRACTURE SURGERY Bilateral    APPENDECTOMY  1977   BACK SURGERY     CARDIAC CATHETERIZATION  2005   "negative"   CHOLECYSTECTOMY OPEN  1977   ESOPHAGOGASTRODUODENOSCOPY  2008   dx'd GERD   FOREARM FRACTURE SURGERY Right    HIP SURGERY Bilateral    "took  out bone spurs"   JOINT REPLACEMENT     KNEE ARTHROSCOPY Bilateral    "total of 18 times"   LACERATION REPAIR Right    hand; "went thru glass door"   LEFT HEART CATH AND CORONARY ANGIOGRAPHY N/A 11/18/2016   Procedure: Left Heart Cath and Coronary Angiography;  Surgeon: Yvonne KendallEnd, Christopher, MD;  Location: [facility];  Service: Cardiovascular;  Laterality: N/A;   LEFT HEART CATH AND CORONARY ANGIOGRAPHY N/A 03/16/2020   Procedure: LEFT HEART CATH AND CORONARY ANGIOGRAPHY;  Surgeon: Yvonne KendallEnd, Christopher, MD;  Location: [facility];  Service: Cardiovascular;  Laterality: N/A;   LUMBAR FUSION     "put rods in"   LUMBAR LAMINECTOMY/DECOMPRESSION MICRODISCECTOMY  05/2006   OPEN REDUCTION INTERNAL FIXATION (ORIF) DISTAL RADIAL FRACTURE Left 06/18/2018   Procedure: OPEN REDUCTION INTERNAL FIXATION (ORIF) DISTAL RADIAL FRACTURE repair and/or reconstruction;  Surgeon: Bradly Bienenstockrtmann, Fred, MD;  Location: [facility];  Service: Orthopedics;  Laterality: Left;   REVISION TOTAL KNEE ARTHROPLASTY Bilateral    RIGHT OOPHORECTOMY Right    SHOULDER OPEN ROTATOR CUFF REPAIR Bilateral    left 04/2006   TONSILLECTOMY     TOTAL KNEE ARTHROPLASTY Bilateral    left 1998; right 2004   TUBAL LIGATION      FAMILY HISTORY: Family History  Problem Relation Age of Onset   COPD Mother    Emphysema Mother    CAD Father 6240   Lung cancer Father    Heart disease Father    Other Sister        TRAP syndrome   Diabetes Sister        boarderline   Iron deficiency Sister     SOCIAL HISTORY: Social History   Socioeconomic History   Marital status: Single    Spouse name: Not on file    Number of children: 0   Years of education: college   Highest education level: Not on file  Occupational History    Comment: retired  Tobacco Use   Smoking status: Never   Smokeless tobacco: Never  Vaping Use   Vaping Use: Never used  Substance and Sexual Activity   Alcohol use: Yes    Comment: occas   Drug use: No   Sexual activity: Yes  Other Topics Concern   Not on file  Social History Narrative   Lives with Myrla HalstedBruce Younts.     Education: college.     Children none.     Retired and Disabled.     Caffeine Drinks tea.   Social Determinants of Health   Financial Resource Strain: Not on file  Food Insecurity: Not on file  Transportation Needs: Not on file  Physical Activity: Not on file  Stress: Not on file  Social Connections: Not on file  Intimate Partner Violence: Not on file     PHYSICAL EXAM  GENERAL EXAM/CONSTITUTIONAL: Vitals:  Vitals:   06/14/21 1340  BP: 132/90  Pulse: 70  SpO2: 95%  Weight: 223 lb 6 oz (101.3 kg)  Height: 5\' 3"  (1.6 m)   Body mass index is 39.57 kg/m. Wt Readings from Last 3 Encounters:  06/14/21 223 lb 6 oz (101.3 kg)  03/25/20 214 lb (97.1 kg)  03/16/20 210 lb (95.3 kg)   Patient is in no distress; well developed, nourished and groomed; neck is supple  CARDIOVASCULAR: Examination of carotid arteries is normal; no carotid bruits Regular rate and rhythm, no murmurs Examination of peripheral vascular system by observation and palpation is normal  EYES:  Ophthalmoscopic exam of optic discs and posterior segments is normal; no papilledema or hemorrhages No results found.   MUSCULOSKELETAL: Gait, strength, tone, movements noted in Neurologic exam below  NEUROLOGIC: MENTAL STATUS:  No flowsheet data found. awake, alert, oriented to person, place and time recent and remote memory intact normal attention and concentration language fluent, comprehension intact, naming intact fund of knowledge appropriate TEARFUL  CRANIAL  NERVE:  2nd - no papilledema on fundoscopic exam 2nd, 3rd, 4th, 6th - pupils equal and reactive to light, visual fields full to confrontation, extraocular muscles intact, no nystagmus 5th - facial sensation symmetric 7th - facial strength symmetric 8th - hearing intact 9th - palate elevates symmetrically, uvula midline 11th - shoulder shrug symmetric 12th - tongue protrusion midline  MOTOR:  normal bulk and tone, full strength in the BUE, BLE; DIFFUSE 4/5 STRENGTH, LIMITED BY PAIN  SENSORY:  normal and symmetric to light touch, temperature, vibration  COORDINATION:  finger-nose-finger, fine finger movements normal  REFLEXES:  deep tendon reflexes TRACE and symmetric  GAIT/STATION:  narrow based gait     DIAGNOSTIC DATA (LABS, IMAGING, TESTING) - I reviewed patient records, labs, notes, testing and imaging myself where available.  Lab Results  Component Value Date   WBC 4.9 03/13/2020   HGB 12.0 03/13/2020   HCT 36.3 03/13/2020   MCV 82 03/13/2020   PLT 196 03/13/2020      Component Value Date/Time   NA 141 03/13/2020 1140   K 4.8 03/13/2020 1140   CL 103 03/13/2020 1140   CO2 24 03/13/2020 1140   GLUCOSE 99 03/13/2020 1140   GLUCOSE 79 02/25/2019 1438   BUN 19 03/13/2020 1140   CREATININE 1.09 (H) 03/13/2020 1140   CREATININE 0.96 02/25/2019 1438   CALCIUM 9.7 03/13/2020 1140   PROT 7.0 02/25/2019 1438   ALBUMIN 3.8 02/25/2019 1438   AST 24 02/25/2019 1438   ALT 17 02/25/2019 1438   ALKPHOS 118 02/25/2019 1438   BILITOT 0.3 02/25/2019 1438   GFRNONAA 53 (L) 03/13/2020 1140   GFRNONAA >60 02/25/2019 1438   GFRAA 61 03/13/2020 1140   GFRAA >60 02/25/2019 1438   Lab Results  Component Value Date   CHOL 130 11/17/2016   HDL 41 11/17/2016   LDLCALC 46 11/17/2016   TRIG 215 (H) 11/17/2016   CHOLHDL 3.2 11/17/2016   Lab Results  Component Value Date   HGBA1C 5.4 11/16/2016   Lab Results  Component Value Date   [id number] 11/06/2018   Lab  Results  Component Value Date   TSH 2.290 03/25/2020    11/16/16 CT head  - No evidence of acute intracranial abnormality. - Mild cortical atrophy.  05/17/17 MRI cervical spine [I reviewed images myself and agree with interpretation. -VRP]  1. Motion degraded examination without acute fracture or malalignment. 2. Stable degenerative change of the cervical spine. Mild canal stenosis C3-4. 3. C3-4 and C5-6 neural foraminal narrowing, likely moderate on the LEFT at C5-6.  07/10/17 MRI lumbar spine [I reviewed images myself and agree with interpretation. -VRP]  1. Fusion at L3-4 and L4-5 without residual or recurrent stenosis at these levels. 2. Broad-based disc protrusion at L2-3 is asymmetric to the left with mild left foraminal narrowing. 3. Mild disc bulging at L5-S1 without significant focal stenosis.  07/16/18 This is a normal age-appropriate MRI of the brain with and without contrast showing the following:    1.   Single left frontal punctate T2/flair hyperintense foci in the subcortical white matter  of the right frontal lobe consistent with very minimal age-appropriate chronic microvascular ischemic change. 2.   There are no acute findings and there is a normal enhancement pattern.    ASSESSMENT AND PLAN  66 y.o. year old female here with chronic pain syndrome, neck pain, back pain, numbness and pain in hands and feet, for many years.  Symptoms worsened in 2018.  Weakness is related to chronic pain issues. Long history of depression and anxiety and fibromyalgia since 23s or 30s.  Dx:  1. Chronic pain syndrome   2. Neck pain   3. Chronic bilateral low back pain without sciatica       PLAN:  - continue gabapentin, hydrocodone, tizanidine, fluoxetine - optimize depression / anxiety (consider psychiatry / psychology) - optimize nutrition, exercise, mindfulness tx - consider PT evaluation  Return for return to PCP, pending if symptoms worsen or fail to improve, return to  referring provider.    Penni Bombard, MD [id number], [id number] PM Certified in Neurology, Neurophysiology and Neuroimaging  [facility] [address] [place], [place] [address] [phone]

## 2021-06-14 NOTE — Patient Instructions (Signed)
-   continue gabapentin, hydrocodone, tizanidine, fluoxetine - optimize depression / anxiety (consider psychiatry / psychology) - optimize nutrition, exercise, mindfulness tx - consider PT evaluation

## 2021-06-30 DIAGNOSIS — M5416 Radiculopathy, lumbar region: Secondary | ICD-10-CM | POA: Diagnosis not present

## 2021-06-30 DIAGNOSIS — Z79891 Long term (current) use of opiate analgesic: Secondary | ICD-10-CM | POA: Diagnosis not present

## 2021-08-24 DIAGNOSIS — H25811 Combined forms of age-related cataract, right eye: Secondary | ICD-10-CM | POA: Diagnosis not present

## 2021-08-24 DIAGNOSIS — Z01818 Encounter for other preprocedural examination: Secondary | ICD-10-CM | POA: Diagnosis not present

## 2021-08-24 DIAGNOSIS — H25812 Combined forms of age-related cataract, left eye: Secondary | ICD-10-CM | POA: Diagnosis not present

## 2021-09-08 DIAGNOSIS — H25811 Combined forms of age-related cataract, right eye: Secondary | ICD-10-CM | POA: Diagnosis not present

## 2021-09-27 DIAGNOSIS — M961 Postlaminectomy syndrome, not elsewhere classified: Secondary | ICD-10-CM | POA: Diagnosis not present

## 2021-09-27 DIAGNOSIS — M5416 Radiculopathy, lumbar region: Secondary | ICD-10-CM | POA: Diagnosis not present

## 2021-09-27 DIAGNOSIS — M5412 Radiculopathy, cervical region: Secondary | ICD-10-CM | POA: Diagnosis not present

## 2021-10-05 DIAGNOSIS — I10 Essential (primary) hypertension: Secondary | ICD-10-CM | POA: Diagnosis not present

## 2021-10-05 DIAGNOSIS — N183 Chronic kidney disease, stage 3 unspecified: Secondary | ICD-10-CM | POA: Diagnosis not present

## 2021-10-05 DIAGNOSIS — E782 Mixed hyperlipidemia: Secondary | ICD-10-CM | POA: Diagnosis not present

## 2021-10-06 DIAGNOSIS — H2512 Age-related nuclear cataract, left eye: Secondary | ICD-10-CM | POA: Diagnosis not present

## 2021-10-06 DIAGNOSIS — H25812 Combined forms of age-related cataract, left eye: Secondary | ICD-10-CM | POA: Diagnosis not present

## 2021-11-15 DIAGNOSIS — E782 Mixed hyperlipidemia: Secondary | ICD-10-CM | POA: Diagnosis not present

## 2021-11-16 DIAGNOSIS — R269 Unspecified abnormalities of gait and mobility: Secondary | ICD-10-CM | POA: Diagnosis not present

## 2021-11-16 DIAGNOSIS — R7303 Prediabetes: Secondary | ICD-10-CM | POA: Diagnosis not present

## 2021-11-16 DIAGNOSIS — G894 Chronic pain syndrome: Secondary | ICD-10-CM | POA: Diagnosis not present

## 2021-11-16 DIAGNOSIS — I10 Essential (primary) hypertension: Secondary | ICD-10-CM | POA: Diagnosis not present

## 2021-11-16 DIAGNOSIS — R748 Abnormal levels of other serum enzymes: Secondary | ICD-10-CM | POA: Diagnosis not present

## 2021-11-16 DIAGNOSIS — I2510 Atherosclerotic heart disease of native coronary artery without angina pectoris: Secondary | ICD-10-CM | POA: Diagnosis not present

## 2021-11-16 DIAGNOSIS — D508 Other iron deficiency anemias: Secondary | ICD-10-CM | POA: Diagnosis not present

## 2021-11-16 DIAGNOSIS — E782 Mixed hyperlipidemia: Secondary | ICD-10-CM | POA: Diagnosis not present

## 2021-11-16 DIAGNOSIS — M109 Gout, unspecified: Secondary | ICD-10-CM | POA: Diagnosis not present

## 2021-11-16 DIAGNOSIS — Z0000 Encounter for general adult medical examination without abnormal findings: Secondary | ICD-10-CM | POA: Diagnosis not present

## 2021-11-16 DIAGNOSIS — K219 Gastro-esophageal reflux disease without esophagitis: Secondary | ICD-10-CM | POA: Diagnosis not present

## 2021-11-16 DIAGNOSIS — M8580 Other specified disorders of bone density and structure, unspecified site: Secondary | ICD-10-CM | POA: Diagnosis not present

## 2021-11-23 DIAGNOSIS — M5416 Radiculopathy, lumbar region: Secondary | ICD-10-CM | POA: Diagnosis not present

## 2021-12-07 DIAGNOSIS — R296 Repeated falls: Secondary | ICD-10-CM

## 2021-12-07 DIAGNOSIS — M5416 Radiculopathy, lumbar region: Secondary | ICD-10-CM | POA: Diagnosis not present

## 2021-12-07 DIAGNOSIS — M545 Low back pain, unspecified: Secondary | ICD-10-CM | POA: Diagnosis present

## 2021-12-14 DIAGNOSIS — M5416 Radiculopathy, lumbar region: Secondary | ICD-10-CM | POA: Diagnosis not present

## 2021-12-20 ENCOUNTER — Ambulatory Visit: Payer: Self-pay | Admitting: Internal Medicine

## 2021-12-26 NOTE — Progress Notes (Deleted)
Office Visit    Patient Name: [name] Date of Encounter: 12/26/2021  Primary Care Provider:  Georgann Housekeeper, MD Primary Cardiologist:  Lesleigh Noe, MD Primary Electrophysiologist: None  Chief Complaint    [name] is a 66 y.o. female with PMH of nonobstructive CAD, HLD, GERD, CKD stage III who presents today for follow-up of CAD.  Past Medical History    Past Medical History:  Diagnosis Date   Anemia    Anxiety    Arthritis    "all over" (11/16/2016)   CAD (coronary artery disease)    Chronic lower back pain    Chronic pain syndrome    Chronic pain syndrome    "all over" (11/16/2016)   CKD (chronic kidney disease), stage III (HCC)    Daily headache    Depression    Fibromyalgia    GERD (gastroesophageal reflux disease)    High cholesterol    History of blood transfusion    "related to hand laceration"   History of hiatal hernia    History of stomach ulcers    "bled when I was younger" (11/16/2016)   Hypercholesterolemia    Hypertension    hx; "they took me off my RX" (11/16/2016)   Migraine 1970s   Phlebitis of leg, right    "years ago" (11/16/2016)   Radiculopathy    Past Surgical History:  Procedure Laterality Date   ABDOMINAL HYSTERECTOMY  1993   w/left ovary   ANKLE FRACTURE SURGERY Bilateral    APPENDECTOMY  1977   BACK SURGERY     CARDIAC CATHETERIZATION  2005   "negative"   CHOLECYSTECTOMY OPEN  1977   ESOPHAGOGASTRODUODENOSCOPY  2008   dx'd GERD   FOREARM FRACTURE SURGERY Right    HIP SURGERY Bilateral    "took out bone spurs"   JOINT REPLACEMENT     KNEE ARTHROSCOPY Bilateral    "total of 18 times"   LACERATION REPAIR Right    hand; "went thru glass door"   LEFT HEART CATH AND CORONARY ANGIOGRAPHY N/A 11/18/2016   Procedure: Left Heart Cath and Coronary Angiography;  Surgeon: Yvonne Kendall, MD;  Location: [facility];  Service: Cardiovascular;  Laterality: N/A;   LEFT HEART CATH AND CORONARY ANGIOGRAPHY  N/A 03/16/2020   Procedure: LEFT HEART CATH AND CORONARY ANGIOGRAPHY;  Surgeon: Yvonne Kendall, MD;  Location: [facility];  Service: Cardiovascular;  Laterality: N/A;   LUMBAR FUSION     "put rods in"   LUMBAR LAMINECTOMY/DECOMPRESSION MICRODISCECTOMY  05/2006   OPEN REDUCTION INTERNAL FIXATION (ORIF) DISTAL RADIAL FRACTURE Left 06/18/2018   Procedure: OPEN REDUCTION INTERNAL FIXATION (ORIF) DISTAL RADIAL FRACTURE repair and/or reconstruction;  Surgeon: Bradly Bienenstock, MD;  Location: [facility];  Service: Orthopedics;  Laterality: Left;   REVISION TOTAL KNEE ARTHROPLASTY Bilateral    RIGHT OOPHORECTOMY Right    SHOULDER OPEN ROTATOR CUFF REPAIR Bilateral    left 04/2006   TONSILLECTOMY     TOTAL KNEE ARTHROPLASTY Bilateral    left 1998; right 2004   TUBAL LIGATION      Allergies  Allergies  Allergen Reactions   Aspirin Nausea Only    Other reaction(s): stomach upset   Oxybutynin Rash   Shellfish Allergy Swelling and Rash    History of Present Illness    [name] is a 66 year old female with the above-mentioned past medical history who presents today for follow-up coronary artery disease.  She was initially seen during hospital consultation  in 2018 for bradycardia and chest pain.  She had underwent LHC by Dr. Katrinka Blazing in 2005 which showed normal coronaries with normal LVEF.  She underwent nuclear stress test that showed partially fixed defect with mid and apical inferior wall ischemia.  She underwent cardiac CTA that showed calcium score of 848 with dense calcium in the mid LAD and RCA.  She had a repeat LHC that showed mild to moderate nonobstructive CAD with recommendation of medical therapy to prevent progression of CAD.  She was placed on ASA therapy due to history of peptic ulcer and was noted to have prolonged QTc of 526 ms in setting of hypokalemia during admission.  QTc was normalized following resolution of hypokalemia with discontinuation of Phenergan and  Seroquel.  She underwent repeat LHC in 03/2020 for complaint of exertional chest pain and shortness of breath.  LHC showed showed mild to moderate, nonobstructive CAD similar to prior cardiac catheterization.  She was last seen by Georgie Chard, NP on 03/2020 for follow-up and reported overwhelming fatigue and weakness.  She also continued to have intermittent chest pain but blood pressures were more stable and LE edema was much improved.  Repeat cath was deferred and patient was advised to follow-up with PCP for further work-up.     Since last being seen in the office patient reports***.  Patient denies chest pain, palpitations, dyspnea, PND, orthopnea, nausea, vomiting, dizziness, syncope, edema, weight gain, or early satiety.     ***Notes:  Home Medications    Current Outpatient Medications  Medication Sig Dispense Refill   Acetaminophen-Caffeine (EXCEDRIN ASPIRIN FREE PO) Take 2 tablets by mouth 3 (three) times daily as needed (pain).      albuterol (VENTOLIN HFA) 108 (90 Base) MCG/ACT inhaler Inhale 2 puffs into the lungs every 4 (four) hours as needed for wheezing or shortness of breath.      ALPRAZolam (XANAX) 1 MG tablet Take 0.5 mg by mouth 3 (three) times daily as needed for anxiety.      aspirin EC 81 MG tablet Take 1 tablet (81 mg total) by mouth daily. Swallow whole. 90 tablet 3   famotidine (PEPCID) 20 MG tablet Take 20 mg by mouth 2 (two) times daily.      FLUoxetine (PROZAC) 20 MG capsule Take 20 mg by mouth in the morning, at noon, and at bedtime.      furosemide (LASIX) 20 MG tablet Take 10-20 mg by mouth daily as needed for fluid or edema.      gabapentin (NEURONTIN) 300 MG capsule Take 300 mg by mouth 3 (three) times daily.     HYDROcodone-acetaminophen (NORCO/VICODIN) 5-325 MG tablet Take 1 tablet by mouth 3 (three) times daily as needed for moderate pain.      ketoconazole (NIZORAL) 2 % cream Apply 1 application topically daily as needed (Rash).      lisinopril  (ZESTRIL) 2.5 MG tablet Take 2.5 mg by mouth daily.     meloxicam (MOBIC) 15 MG tablet Take 15 mg by mouth daily.      nitroGLYCERIN (NITROSTAT) 0.4 MG SL tablet Place 1 tablet (0.4 mg total) under the tongue every 5 (five) minutes x 3 doses as needed for chest pain. 25 tablet 2   omeprazole (PRILOSEC) 20 MG capsule Take 20 mg by mouth 2 (two) times daily.     promethazine (PHENERGAN) 25 MG tablet Take 12.5 mg by mouth every 8 (eight) hours as needed for nausea or vomiting.      rosuvastatin (CRESTOR) 20  MG tablet TAKE 1 TABLET BY MOUTH ONCE DAILY . APPOINTMENT REQUIRED FOR FUTURE REFILLS 30 tablet 0   tiZANidine (ZANAFLEX) 4 MG tablet Take 4 mg by mouth 2 (two) times daily as needed for muscle spasms.      triamcinolone cream (KENALOG) 0.1 % Apply 1 application topically 2 (two) times daily.      No current facility-administered medications for this visit.     Review of Systems  Please see the history of present illness.    (+)*** (+)***  All other systems reviewed and are otherwise negative except as noted above.  Physical Exam    Wt Readings from Last 3 Encounters:  06/14/21 223 lb 6 oz (101.3 kg)  03/25/20 214 lb (97.1 kg)  03/16/20 210 lb (95.3 kg)   [id number] were no vitals filed for this visit.,There is no height or weight on file to calculate BMI.  Constitutional:      Appearance: Healthy appearance. Not in distress.  Neck:     Vascular: JVD normal.  Pulmonary:     Effort: Pulmonary effort is normal.     Breath sounds: No wheezing. No rales. Diminished in the bases Cardiovascular:     Normal rate. Regular rhythm. Normal S1. Normal S2.      Murmurs: There is no murmur.  Edema:    Peripheral edema absent.  Abdominal:     Palpations: Abdomen is soft non tender. There is no hepatomegaly.  Skin:    General: Skin is warm and dry.  Neurological:     General: No focal deficit present.     Mental Status: Alert and oriented to person, place and time.     Cranial Nerves:  Cranial nerves are intact.  EKG/LABS/Other Studies Reviewed    ECG personally reviewed by me today - ***  Risk Assessment/Calculations:   {Does this patient have ATRIAL FIBRILLATION?[phone]}        Lab Results  Component Value Date   WBC 4.9 03/13/2020   HGB 12.0 03/13/2020   HCT 36.3 03/13/2020   MCV 82 03/13/2020   PLT 196 03/13/2020   Lab Results  Component Value Date   CREATININE 1.09 (H) 03/13/2020   BUN 19 03/13/2020   NA 141 03/13/2020   K 4.8 03/13/2020   CL 103 03/13/2020   CO2 24 03/13/2020   Lab Results  Component Value Date   ALT 17 02/25/2019   AST 24 02/25/2019   ALKPHOS 118 02/25/2019   BILITOT 0.3 02/25/2019   Lab Results  Component Value Date   CHOL 130 11/17/2016   HDL 41 11/17/2016   LDLCALC 46 11/17/2016   TRIG 215 (H) 11/17/2016   CHOLHDL 3.2 11/17/2016    Lab Results  Component Value Date   HGBA1C 5.4 11/16/2016    Assessment & Plan    1.  Nonobstructive CAD  2.  Dyspnea on exertion  3.  History of prolonged QT interval  4.  HLD      Disposition: Follow-up with [place]va Crome III, MD or APP in *** months {Are you ordering a CV Procedure (e.g. stress test, cath, DCCV, TEE, etc)?   Press F2        :[id number]   Medication Adjustments/Labs and Tests Ordered: Current medicines are reviewed at length with the patient today.  Concerns regarding medicines are outlined above.   Signed, Mable Fill, Marissa Nestle, NP 12/26/2021, 1:53 PM [place]

## 2021-12-28 ENCOUNTER — Ambulatory Visit: Payer: Medicare Other | Admitting: Nurse Practitioner

## 2021-12-31 NOTE — Progress Notes (Unsigned)
Office Visit    Patient Name: [name] Date of Encounter: 01/03/2022  Primary Care Provider:  [name], MD Primary Cardiologist:  Kelli Grooms, MD Primary Electrophysiologist: None  Chief Complaint    [name] is a 66 y.o. female with PMH of nonobstructive CAD, HLD, GERD, CKD stage III who presents today for follow-up of CAD.  Past Medical History    Past Medical History:  Diagnosis Date   Anemia    Anxiety    Arthritis    "all over" (11/16/2016)   CAD (coronary artery disease)    Chronic lower back pain    Chronic pain syndrome    Chronic pain syndrome    "all over" (11/16/2016)   CKD (chronic kidney disease), stage III (HCC)    Daily headache    Depression    Fibromyalgia    GERD (gastroesophageal reflux disease)    High cholesterol    History of blood transfusion    "related to hand laceration"   History of hiatal hernia    History of stomach ulcers    "bled when I was younger" (11/16/2016)   Hypercholesterolemia    Hypertension    hx; "they took me off my RX" (11/16/2016)   Migraine 1970s   Phlebitis of leg, right    "years ago" (11/16/2016)   Radiculopathy    Past Surgical History:  Procedure Laterality Date   ABDOMINAL HYSTERECTOMY  1993   w/left ovary   ANKLE FRACTURE SURGERY Bilateral    [place]  2005   "negative"   CHOLECYSTECTOMY OPEN  1977   ESOPHAGOGASTRODUODENOSCOPY  2008   dx'd GERD   FOREARM FRACTURE SURGERY Right    HIP SURGERY Bilateral    "took out bone spurs"   JOINT REPLACEMENT     KNEE ARTHROSCOPY Bilateral    "total of 18 times"   LACERATION REPAIR Right    hand; "went thru glass door"   LEFT HEART CATH AND CORONARY ANGIOGRAPHY N/A 11/18/2016   Procedure: Left Heart Cath and Coronary Angiography;  Surgeon: Kelli Bush, MD;  Location: [facility];  Service: Cardiovascular;  Laterality: N/A;   LEFT HEART CATH AND CORONARY ANGIOGRAPHY  N/A 03/16/2020   Procedure: LEFT HEART CATH AND CORONARY ANGIOGRAPHY;  Surgeon: Kelli Bush, MD;  Location: [facility];  Service: Cardiovascular;  Laterality: N/A;   LUMBAR FUSION     "put rods in"   LUMBAR LAMINECTOMY/DECOMPRESSION MICRODISCECTOMY  05/2006   OPEN REDUCTION INTERNAL FIXATION (ORIF) DISTAL RADIAL FRACTURE Left 06/18/2018   Procedure: OPEN REDUCTION INTERNAL FIXATION (ORIF) DISTAL RADIAL FRACTURE repair and/or reconstruction;  Surgeon: Kelli Planas, MD;  Location: [facility];  Service: Orthopedics;  Laterality: Left;   REVISION TOTAL KNEE ARTHROPLASTY Bilateral    RIGHT OOPHORECTOMY Right    SHOULDER OPEN ROTATOR CUFF REPAIR Bilateral    left 04/2006   TONSILLECTOMY     TOTAL KNEE ARTHROPLASTY Bilateral    left 1998; right 2004   TUBAL LIGATION      Allergies  Allergies  Allergen Reactions   Aspirin Nausea Only    Other reaction(s): stomach upset   Oxybutynin Rash   Shellfish Allergy Swelling and Rash    History of Present Illness    [name] is a 66 year old female with the above-mentioned past medical history who presents today for follow-up coronary artery disease.  She was initially seen during hospital consultation  in 2018 for bradycardia and chest pain.  She had underwent LHC by Dr. Katrinka [name] in 2005 which showed normal coronaries with normal LVEF.  She underwent nuclear stress test that showed partially fixed defect with mid and apical inferior wall ischemia.  She underwent cardiac CTA that showed calcium score of 848 with dense calcium in the mid LAD and RCA.  She had a repeat LHC that showed mild to moderate nonobstructive CAD with recommendation of medical therapy to prevent progression of CAD.  She was placed on ASA therapy due to history of peptic ulcer and was noted to have prolonged QTc of 526 ms in setting of hypokalemia during admission.  QTc was normalized following resolution of hypokalemia with discontinuation of Phenergan and Seroquel.    She underwent repeat LHC in 03/2020 for complaint of exertional chest pain and shortness of breath.  LHC showed showed mild to moderate, nonobstructive CAD similar to prior cardiac catheterization.  She was last seen by Kelli Chard, NP on 03/2020 for follow-up and reported overwhelming fatigue and weakness.  She also continued to have intermittent chest pain but blood pressures were more stable and LE edema was much improved.  Repeat cath was deferred and patient was advised to follow-up with PCP for further work-up.     [name] is here with her husband today for follow-up.  Since last being seen in the office patient reports that she has been feeling increased amount of chest pain with and without activity. She rates this pain as 8 out of 10 at times and is not relieved with rest.  She is compliant with her current medication regimen and blood pressure was well controlled today at 132/80.  Patient denies chest pain, palpitations, dyspnea, PND, orthopnea, nausea, vomiting, dizziness, syncope, edema, weight gain, or early satiety.   Home Medications    Current Outpatient Medications  Medication Sig Dispense Refill   Acetaminophen-Caffeine (EXCEDRIN ASPIRIN FREE PO) Take 2 tablets by mouth 3 (three) times daily as needed (pain).      albuterol (VENTOLIN HFA) 108 (90 Base) MCG/ACT inhaler Inhale 2 puffs into the lungs every 4 (four) hours as needed for wheezing or shortness of breath.      ALPRAZolam (XANAX) 1 MG tablet Take 1 tablet by mouth 3 (three) times daily as needed.     amLODipine (NORVASC) 5 MG tablet Take 1 tablet (5 mg total) by mouth daily. 90 tablet 3   baclofen (LIORESAL) 10 MG tablet Take 10 mg by mouth 3 (three) times daily as needed.     DULoxetine (CYMBALTA) 20 MG capsule Take 20 mg by mouth 2 (two) times daily.     famotidine (PEPCID) 20 MG tablet Take 20 mg by mouth 2 (two) times daily.      FERREX 150 150 MG capsule Take 150 mg by mouth daily.     FLUoxetine (PROZAC) 20  MG capsule Take 20 mg by mouth in the morning, at noon, and at bedtime.      furosemide (LASIX) 20 MG tablet Take 10-20 mg by mouth daily as needed for fluid or edema.      gabapentin (NEURONTIN) 300 MG capsule Take 300 mg by mouth 3 (three) times daily.     HYDROcodone-acetaminophen (NORCO) 10-325 MG tablet Take 1 tablet by mouth 4 (four) times daily as needed.     lisinopril (ZESTRIL) 10 MG tablet Take 10 mg by mouth daily.     meloxicam (MOBIC) 15 MG tablet Take 15 mg by mouth daily.  nitroGLYCERIN (NITROSTAT) 0.4 MG SL tablet Place 1 tablet (0.4 mg total) under the tongue every 5 (five) minutes x 3 doses as needed for chest pain. 25 tablet 2   omeprazole (PRILOSEC) 20 MG capsule Take 20 mg by mouth 2 (two) times daily.     potassium chloride (MICRO-K) 10 MEQ CR capsule Take 20 mEq by mouth 2 (two) times daily.     promethazine (PHENERGAN) 25 MG tablet Take 12.5 mg by mouth every 8 (eight) hours as needed for nausea or vomiting.      rosuvastatin (CRESTOR) 20 MG tablet TAKE 1 TABLET BY MOUTH ONCE DAILY . APPOINTMENT REQUIRED FOR FUTURE REFILLS 30 tablet 0   tiZANidine (ZANAFLEX) 4 MG tablet Take 4 mg by mouth 2 (two) times daily as needed for muscle spasms.      No current facility-administered medications for this visit.     Review of Systems  Please see the history of present illness.    (+) Chest pain (+) Shortness of breath  All other systems reviewed and are otherwise negative except as noted above.  Physical Exam    Wt Readings from Last 3 Encounters:  01/03/22 215 lb (97.5 kg)  06/14/21 223 lb 6 oz (101.3 kg)  03/25/20 214 lb (97.1 kg)   VS: Vitals:   01/03/22 1356  BP: 132/80  Pulse: 60  SpO2: 95%  ,Body mass index is 38.09 kg/m.  Constitutional:      Appearance: Healthy appearance. Not in distress.  Neck:     Vascular: JVD normal.  Pulmonary:     Effort: Pulmonary effort is normal.     Breath sounds: No wheezing. No rales. Diminished in the  bases Cardiovascular:     Normal rate. Regular rhythm. Normal S1. Normal S2.      Murmurs: There is no murmur.  Edema:    Peripheral edema absent.  Abdominal:     Palpations: Abdomen is soft non tender. There is no hepatomegaly.  Skin:    General: Skin is warm and dry.  Neurological:     General: No focal deficit present.     Mental Status: Alert and oriented to person, place and time.     Cranial Nerves: Cranial nerves are intact.  EKG/LABS/Other Studies Reviewed    ECG personally reviewed by me today -sinus rhythm with rate of 60 and no acute changes   Lab Results  Component Value Date   WBC 4.9 03/13/2020   HGB 12.0 03/13/2020   HCT 36.3 03/13/2020   MCV 82 03/13/2020   PLT 196 03/13/2020   Lab Results  Component Value Date   CREATININE 1.09 (H) 03/13/2020   BUN 19 03/13/2020   NA 141 03/13/2020   K 4.8 03/13/2020   CL 103 03/13/2020   CO2 24 03/13/2020   Lab Results  Component Value Date   ALT 17 02/25/2019   AST 24 02/25/2019   ALKPHOS 118 02/25/2019   BILITOT 0.3 02/25/2019   Lab Results  Component Value Date   CHOL 130 11/17/2016   HDL 41 11/17/2016   LDLCALC 46 11/17/2016   TRIG 215 (H) 11/17/2016   CHOLHDL 3.2 11/17/2016    Lab Results  Component Value Date   HGBA1C 5.4 11/16/2016    Assessment & Plan   1.  Nonobstructive CAD: -LHC performed 2021 showing mild to moderate nonobstructive CAD with mildly elevated left ventricular systolic pressure -Continue GDMT with Crestor 20 mg and patient currently not on ASA 81 mg due to GI  upset.  2.  Dyspnea on exertion: -Patient reports increased shortness of breath with exertion.  This is accompanied with chest pressure that is relieved with rest. -We will plan to repeat 2D echo following cardiac catheterization   3.  History of prolonged QT interval: -EKG today sinus rhythm with QT of 468   4.  HLD: -Lipids followed by PCP -Continue Crestor 20 mg daily  5.  Unstable angina: -Case discussed with  DOD Dr. Burt Knack who suggested patient undergo LHC with CFR testing for further evaluation of angina and shortness of breath. -Start amlodipine 5 mg daily -BMET and CBC today -Patient advised to use as needed nitroglycerin for increased chest pain.    Disposition: Follow-up with Kelli Grooms, MD or APP in 1 months Shared Decision Making/Informed Consent The risks [stroke (1 in 1000), death (1 in 34), kidney failure [usually temporary] (1 in 500), bleeding (1 in 200), allergic reaction [possibly serious] (1 in 200)], benefits (diagnostic support and management of coronary artery disease) and alternatives of a cardiac catheterization were discussed in detail with [name] and she is willing to proceed.   Medication Adjustments/Labs and Tests Ordered: Current medicines are reviewed at length with the patient today.  Concerns regarding medicines are outlined above.   Signed, Mable Fill, Marissa Nestle, NP 01/03/2022, 3:54 PM [place]

## 2022-01-03 ENCOUNTER — Encounter: Payer: Self-pay | Admitting: Nurse Practitioner

## 2022-01-03 ENCOUNTER — Ambulatory Visit: Payer: Medicare Other | Attending: Nurse Practitioner | Admitting: Nurse Practitioner

## 2022-01-03 ENCOUNTER — Telehealth: Payer: Self-pay | Admitting: Nurse Practitioner

## 2022-01-03 VITALS — BP 132/80 | HR 60 | Ht 63.0 in | Wt 215.0 lb

## 2022-01-03 DIAGNOSIS — I25119 Atherosclerotic heart disease of native coronary artery with unspecified angina pectoris: Secondary | ICD-10-CM | POA: Diagnosis not present

## 2022-01-03 DIAGNOSIS — I10 Essential (primary) hypertension: Secondary | ICD-10-CM

## 2022-01-03 DIAGNOSIS — E7800 Pure hypercholesterolemia, unspecified: Secondary | ICD-10-CM | POA: Diagnosis not present

## 2022-01-03 DIAGNOSIS — I200 Unstable angina: Secondary | ICD-10-CM | POA: Diagnosis not present

## 2022-01-03 DIAGNOSIS — R079 Chest pain, unspecified: Secondary | ICD-10-CM | POA: Diagnosis not present

## 2022-01-03 LAB — BASIC METABOLIC PANEL
BUN/Creatinine Ratio: 13 (ref 12–28)
BUN: 10 mg/dL (ref 8–27)
CO2: 28 mmol/L (ref 20–29)
Calcium: 9.3 mg/dL (ref 8.7–10.3)
Chloride: 108 mmol/L — ABNORMAL HIGH (ref 96–106)
Creatinine, Ser: 0.79 mg/dL (ref 0.57–1.00)
Glucose: 84 mg/dL (ref 70–99)
Potassium: 4.1 mmol/L (ref 3.5–5.2)
Sodium: 141 mmol/L (ref 134–144)
eGFR: 82 mL/min/{1.73_m2} (ref >59)

## 2022-01-03 LAB — CBC
Hematocrit: 28.5 % — ABNORMAL LOW (ref 34.0–46.6)
Hemoglobin: 8.8 g/dL — ABNORMAL LOW (ref 11.1–15.9)
MCH: 23.2 pg — ABNORMAL LOW (ref 26.6–33.0)
MCHC: 30.9 g/dL — ABNORMAL LOW (ref 31.5–35.7)
MCV: 75 fL — ABNORMAL LOW (ref 79–97)
Platelets: 184 10*3/uL (ref 150–450)
RBC: 3.79 x10E6/uL (ref 3.77–5.28)
RDW: 16.7 % — ABNORMAL HIGH (ref 11.7–15.4)
WBC: 4.9 10*3/uL (ref 3.4–10.8)

## 2022-01-03 MED ORDER — SODIUM CHLORIDE 0.9% FLUSH: 3.0000 mL | Freq: Two times a day (BID) | INTRAVENOUS | Status: DC

## 2022-01-03 MED ORDER — AMLODIPINE BESYLATE 5 MG PO TABS: 5.0000 mg | ORAL_TABLET | Freq: Every day | ORAL | 3 refills | Status: DC

## 2022-01-03 NOTE — Patient Instructions (Addendum)
Medication Instructions:  Your physician has recommended you make the following change in your medication:  START Amlodipine 5 mg taking 1 daily   *If you need a refill on your cardiac medications before your next appointment, please call your pharmacy*   Lab Work: TODAY:  BMET & CBC  If you have labs (blood work) drawn today and your tests are completely normal, you will receive your results only by: MyChart Message (if you have MyChart) OR A paper copy in the mail If you have any lab test that is abnormal or we need to change your treatment, we will call you to review the results.   Testing/Procedures: Your physician has requested that you have a cardiac catheterization. Cardiac catheterization is used to diagnose and/or treat various heart conditions. Doctors may recommend this procedure for a number of different reasons. The most common reason is to evaluate chest pain. Chest pain can be a symptom of coronary artery disease (CAD), and cardiac catheterization can show whether plaque is narrowing or blocking your heart's arteries. This procedure is also used to evaluate the valves, as well as measure the blood flow and oxygen levels in different parts of your heart. For further information please visit [email or web address] Please follow instruction sheet, BELOW:   [facility] A DEPT OF [place]. [facility] A DEPT OF [facility] [address] [place], [place] [address] [id number] [facility] [place] [address] Dept: [phone] Loc: [phone]  [name]  01/03/2022  You are scheduled for a Cardiac Catheterization on Tuesday, August 29 with Dr. Verdis Prime.  1. Please arrive at the [facility] (Main Entrance A) at [facility]: [address] [place], [place] [address] at 7:00 AM (This time is two hours before your procedure to ensure your preparation). Free valet parking service is available.   Special note:  Every effort is made to have your procedure done on time. Please understand that emergencies sometimes delay scheduled procedures.  2. Diet: Do not eat solid foods after midnight.  The patient may have clear liquids until 5am upon the day of the procedure.  3. Labs: You will need to have blood drawn on TODAY  4. Medication instructions in preparation for your procedure:   Contrast Allergy: No   Stop taking, Lasix (Furosemide)  Tuesday, August 29,   On the morning of your procedure, take your  PT CAN'T TOLERATE ASPIRIN  and any morning medicines NOT listed above.  You may use sips of water.  5. Plan for one night stay--bring personal belongings. 6. Bring a current list of your medications and current insurance cards. 7. You MUST have a responsible person to drive you home. 8. Someone MUST be with you the first 24 hours after you arrive home or your discharge will be delayed. 9. Please wear clothes that are easy to get on and off and wear slip-on shoes.  Thank you for allowing [place] to care for you!   -- [name] Invasive Cardiovascular services     Follow-Up: At [facility], you and your health needs are our priority.  As part of our continuing mission to provide you with exceptional heart care, we have created designated Provider Care Teams.  These Care Teams include your primary Cardiologist (physician) and Advanced Practice Providers (APPs -  Physician Assistants and Nurse Practitioners) who all work together to provide you with the care you need, when you need it.  We recommend signing  up for the patient portal called "MyChart".  Sign up information is provided on this After Visit Summary.  MyChart is used to connect with patients for Virtual Visits (Telemedicine).  Patients are able to view lab/test results, encounter notes, upcoming appointments, etc.  Non-urgent messages can be sent to your provider as well.   To learn more about what you can do with MyChart, go to  [email or web address].    Your next appointment:   2 week(s)  9/12 ARRIVE AT 2:00   The format for your next appointment:   In Person  Provider:   Lesleigh Noe, MD  or Robin Searing, NP         Other Instructions   Important Information About Sugar

## 2022-01-03 NOTE — Telephone Encounter (Signed)
Patient contacted regarding recent hemoglobin value and upcoming scheduled cardiac catheterization.  She was advised that procedure will be canceled due to decreased hemoglobin.  She was instructed to contact her PCP regarding anemia work-up.  She was also instructed to go to the emergency room if she is feeling poorly and needs immediate attention.  She expressed a full understanding and thanked me for the call.  She was instructed to reach out if she had any questions or concerns.  Robin Searing, NP

## 2022-01-04 ENCOUNTER — Encounter (HOSPITAL_COMMUNITY): Admission: RE | Payer: Self-pay | Source: Home / Self Care

## 2022-01-04 ENCOUNTER — Telehealth: Payer: Self-pay | Admitting: Nurse Practitioner

## 2022-01-04 ENCOUNTER — Ambulatory Visit (HOSPITAL_COMMUNITY)
Admission: RE | Admit: 2022-01-04 | Payer: Medicare Other | Source: Home / Self Care | Admitting: Interventional Cardiology

## 2022-01-04 DIAGNOSIS — D508 Other iron deficiency anemias: Secondary | ICD-10-CM | POA: Diagnosis not present

## 2022-01-04 DIAGNOSIS — G8929 Other chronic pain: Secondary | ICD-10-CM | POA: Diagnosis not present

## 2022-01-04 DIAGNOSIS — N183 Chronic kidney disease, stage 3 unspecified: Secondary | ICD-10-CM | POA: Diagnosis not present

## 2022-01-04 DIAGNOSIS — E782 Mixed hyperlipidemia: Secondary | ICD-10-CM | POA: Diagnosis not present

## 2022-01-04 DIAGNOSIS — K219 Gastro-esophageal reflux disease without esophagitis: Secondary | ICD-10-CM | POA: Diagnosis not present

## 2022-01-04 DIAGNOSIS — I2510 Atherosclerotic heart disease of native coronary artery without angina pectoris: Secondary | ICD-10-CM | POA: Diagnosis not present

## 2022-01-04 DIAGNOSIS — I10 Essential (primary) hypertension: Secondary | ICD-10-CM | POA: Diagnosis not present

## 2022-01-04 SURGERY — LEFT HEART CATH AND CORONARY ANGIOGRAPHY
Anesthesia: LOCAL

## 2022-01-04 NOTE — Telephone Encounter (Signed)
Katlyn from [facility] called stating they need a copy of this patient most recent OV notes faxed to them at [phone].

## 2022-01-11 ENCOUNTER — Other Ambulatory Visit: Payer: Self-pay | Admitting: Internal Medicine

## 2022-01-11 ENCOUNTER — Ambulatory Visit
Admission: RE | Admit: 2022-01-11 | Discharge: 2022-01-11 | Disposition: A | Discharge status: Home / Self Care | Payer: Medicare Other | Source: Ambulatory Visit | Attending: Internal Medicine | Admitting: Internal Medicine

## 2022-01-11 DIAGNOSIS — K449 Diaphragmatic hernia without obstruction or gangrene: Secondary | ICD-10-CM | POA: Diagnosis not present

## 2022-01-11 DIAGNOSIS — J9811 Atelectasis: Secondary | ICD-10-CM | POA: Diagnosis not present

## 2022-01-11 DIAGNOSIS — R0789 Other chest pain: Secondary | ICD-10-CM | POA: Diagnosis not present

## 2022-01-11 DIAGNOSIS — K635 Polyp of colon: Secondary | ICD-10-CM | POA: Diagnosis not present

## 2022-01-11 DIAGNOSIS — D649 Anemia, unspecified: Secondary | ICD-10-CM | POA: Diagnosis not present

## 2022-01-13 ENCOUNTER — Telehealth: Payer: Self-pay | Admitting: Hematology

## 2022-01-13 NOTE — Telephone Encounter (Signed)
Scheduled appt per 9/7 referral. Pt already established with Dr. Candise Che. Pt is aware of appt date and time. Pt is aware to arrive 15 mins prior to appt time and to bring and updated insurance card. Pt is aware of appt location.

## 2022-01-18 ENCOUNTER — Ambulatory Visit: Payer: Medicare Other | Admitting: Nurse Practitioner

## 2022-01-18 NOTE — Telephone Encounter (Signed)
Fax sent to PCP via Epic.

## 2022-01-20 DIAGNOSIS — R29898 Other symptoms and signs involving the musculoskeletal system: Secondary | ICD-10-CM | POA: Diagnosis not present

## 2022-01-20 DIAGNOSIS — R200 Anesthesia of skin: Secondary | ICD-10-CM | POA: Diagnosis not present

## 2022-01-20 DIAGNOSIS — M961 Postlaminectomy syndrome, not elsewhere classified: Secondary | ICD-10-CM | POA: Diagnosis not present

## 2022-01-20 DIAGNOSIS — R2681 Unsteadiness on feet: Secondary | ICD-10-CM | POA: Diagnosis not present

## 2022-01-20 NOTE — Telephone Encounter (Signed)
Faxed OV notes to [facility] as requested on 01/18/22.

## 2022-01-25 DIAGNOSIS — M5416 Radiculopathy, lumbar region: Secondary | ICD-10-CM | POA: Diagnosis not present

## 2022-02-07 ENCOUNTER — Inpatient Hospital Stay: Payer: Medicare Other | Attending: Hematology | Admitting: Hematology

## 2022-02-07 ENCOUNTER — Inpatient Hospital Stay: Payer: Medicare Other

## 2022-02-07 VITALS — BP 155/79 | HR 57 | Temp 97.7°F | Resp 17 | Ht 63.0 in | Wt 205.8 lb

## 2022-02-07 DIAGNOSIS — R5383 Other fatigue: Secondary | ICD-10-CM | POA: Insufficient documentation

## 2022-02-07 DIAGNOSIS — D509 Iron deficiency anemia, unspecified: Secondary | ICD-10-CM

## 2022-02-07 LAB — CMP (CANCER CENTER ONLY)
ALT: 11 U/L (ref 0–44)
AST: 17 U/L (ref 15–41)
Albumin: 4 g/dL (ref 3.5–5.0)
Alkaline Phosphatase: 130 U/L — ABNORMAL HIGH (ref 38–126)
Anion gap: 7 (ref 5–15)
BUN: 13 mg/dL (ref 8–23)
CO2: 29 mmol/L (ref 22–32)
Calcium: 9 mg/dL (ref 8.9–10.3)
Chloride: 108 mmol/L (ref 98–111)
Creatinine: 0.75 mg/dL (ref 0.44–1.00)
GFR, Estimated: >60 mL/min (ref >60)
Glucose, Bld: 94 mg/dL (ref 70–99)
Potassium: 3.2 mmol/L — ABNORMAL LOW (ref 3.5–5.1)
Sodium: 144 mmol/L (ref 135–145)
Total Bilirubin: 0.3 mg/dL (ref 0.3–1.2)
Total Protein: 6.8 g/dL (ref 6.5–8.1)

## 2022-02-07 LAB — CBC WITH DIFFERENTIAL (CANCER CENTER ONLY)
Abs Immature Granulocytes: 0.01 10*3/uL (ref 0.00–0.07)
Basophils Absolute: 0 10*3/uL (ref 0.0–0.1)
Basophils Relative: 0 %
Eosinophils Absolute: 0.2 10*3/uL (ref 0.0–0.5)
Eosinophils Relative: 5 %
HCT: 30.3 % — ABNORMAL LOW (ref 36.0–46.0)
Hemoglobin: 9.3 g/dL — ABNORMAL LOW (ref 12.0–15.0)
Immature Granulocytes: 0 %
Lymphocytes Relative: 29 %
Lymphs Abs: 1 10*3/uL (ref 0.7–4.0)
MCH: 23 pg — ABNORMAL LOW (ref 26.0–34.0)
MCHC: 30.7 g/dL (ref 30.0–36.0)
MCV: 75 fL — ABNORMAL LOW (ref 80.0–100.0)
Monocytes Absolute: 0.3 10*3/uL (ref 0.1–1.0)
Monocytes Relative: 7 %
Neutro Abs: 2 10*3/uL (ref 1.7–7.7)
Neutrophils Relative %: 59 %
Platelet Count: 171 10*3/uL (ref 150–400)
RBC: 4.04 MIL/uL (ref 3.87–5.11)
RDW: 15.9 % — ABNORMAL HIGH (ref 11.5–15.5)
WBC Count: 3.5 10*3/uL — ABNORMAL LOW (ref 4.0–10.5)
nRBC: 0 % (ref 0.0–0.2)

## 2022-02-07 LAB — IRON AND IRON BINDING CAPACITY (CC-WL,HP ONLY)
Iron: 12 ug/dL — ABNORMAL LOW (ref 28–170)
Saturation Ratios: 3 % — ABNORMAL LOW (ref 10.4–31.8)
TIBC: 456 ug/dL — ABNORMAL HIGH (ref 250–450)
UIBC: 444 ug/dL — ABNORMAL HIGH (ref 148–442)

## 2022-02-07 LAB — VITAMIN B12: Vitamin B-12: 1663 pg/mL — ABNORMAL HIGH (ref 180–914)

## 2022-02-07 LAB — FERRITIN: Ferritin: 35 ng/mL (ref 11–307)

## 2022-02-14 ENCOUNTER — Encounter: Payer: Self-pay | Admitting: Hematology

## 2022-02-14 NOTE — Progress Notes (Signed)
HEMATOLOGY/ONCOLOGY CLINIC NOTE  Date of Service: 02/07/2022   Patient Care Team: Kelli Low, MD as PCP - General (Internal Medicine) Kelli Crome, MD as PCP - Cardiology (Cardiology)  CHIEF COMPLAINTS/PURPOSE OF CONSULTATION:  Delayed follow-up for anemia  HISTORY OF PRESENTING ILLNESS:   [name] is a wonderful 66 y.o. female who has been referred to [place] by Dr. Wenda [name] for evaluation and management of Anemia. The pt reports that she is doing well overall.   The pt reports that she has had "anemia off and on for the last 3-4 years, but last year was the worst ever." She started [facility] slow release iron replacement for the last month, in addition to potassium replacement. She takes 3 pills of her PO iron per day (total of 135mg  elemental iron). Denies GI intolerance with iron replacement. She denies ice cravings or picca symptoms. She has not had IV iron in the past.  She has taken Omeprazole for 2-3 years and endorses "real bad acid reflux." She had a bleeding ulcer in 2018. She denies ulcers or concerns for GI bleeding recently, and denies blood in the stools or black stools. She notes that she recently had a colonoscopy and endoscopy in the last couple months with [facility] and notes that this was unremarkable. The pt notes that she has had two blood transfusions, one in 2018 with stomach ulcer bleed and second prior to a February 2020 surgery.  The pt had surgery on her left wrist in February 2020 after falling out of her bed and fracturing her bed. She will be having her bed lowered soon. She notes that she also fell a few weeks ago when she lost her balance, and sustained a cut on her forehead requiring stitches. She notes that she has poor balance and endorses peripheral neuropathy. She also endorses fibro myalgia.   The pt notes that she has migraines and uses Excedrin a few times each month.   She notes that she had less fatigue after her blood  transfusions, but has recently been feeling more tired. She denies dietary restrictions. She notes that she has a hard time swallowing occasionally, and has had her esophagus dilated once before. She notes that she doesn't have much of an appetite. She denies unexpected weight loss. She endorses staying very well hydrated with frequent water consumption.  Most recent lab results (06/27/18) of CBC is as follows: all values are WNL except for HGB at 9.6, HCT at 31.1, MCV at 67.9, MCH at 21.0, MCHC at 30.9, RDW at 25.3.  On review of systems, pt reports feeling tired, [name] appetite, acid reflux, poor balance, and denies picca symptoms, unexpected weight loss, noticing new lumps or bumps, new pain along the spine, abdominal pains, leg swelling, and any other symptoms.   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 On Social Hx the pt denies alcohol consumption or ever smoking cigarettes. On Family Hx the pt denies blood disorders  INTERVAL HISTORY:  .[name] is here for follow-up for continued evaluation and management of her anemia after her last clinic visit with [place] about 3 years ago.  Her hemoglobin is down from 12 on 03/13/2020 down to 8.8 on 01/03/2022 with an MCV of 75 normal platelet count of 184k and WBC count of 4.9k  She notes significant fatigue and exercise intolerance.  No overt GI bleeding noted. She has had previous history of stomach ulcers. Has been on chronic meloxicam use.  Labs done today were reviewed in detail with the patient.  MEDICAL HISTORY:  Past Medical History:  Diagnosis Date   Anemia    Anxiety    Arthritis    "all over" (11/16/2016)   CAD (coronary artery disease)    Chronic lower back pain    Chronic pain syndrome    Chronic pain syndrome    "all over" (11/16/2016)   CKD (chronic kidney disease), stage III (HCC)    Daily headache    Depression    Fibromyalgia    GERD (gastroesophageal reflux disease)    High cholesterol     History of blood transfusion    "related to hand laceration"   History of hiatal hernia    History of stomach ulcers    "bled when I was younger" (11/16/2016)   Hypercholesterolemia    Hypertension    hx; "they took me off my RX" (11/16/2016)   Migraine 1970s   Phlebitis of leg, right    "years ago" (11/16/2016)   Radiculopathy     SURGICAL HISTORY: Past Surgical History:  Procedure Laterality Date   ABDOMINAL HYSTERECTOMY  1993   w/left ovary   ANKLE FRACTURE SURGERY Bilateral    APPENDECTOMY  1977   BACK SURGERY     CARDIAC CATHETERIZATION  2005   "negative"   CHOLECYSTECTOMY OPEN  1977   ESOPHAGOGASTRODUODENOSCOPY  2008   dx'd GERD   FOREARM FRACTURE SURGERY Right    HIP SURGERY Bilateral    "took out bone spurs"   JOINT REPLACEMENT     KNEE ARTHROSCOPY Bilateral    "total of 18 times"   LACERATION REPAIR Right    hand; "went thru glass door"   LEFT HEART CATH AND CORONARY ANGIOGRAPHY N/A 11/18/2016   Procedure: Left Heart Cath and Coronary Angiography;  Surgeon: Kelli Bush, MD;  Location: [facility];  Service: Cardiovascular;  Laterality: N/A;   LEFT HEART CATH AND CORONARY ANGIOGRAPHY N/A 03/16/2020   Procedure: LEFT HEART CATH AND CORONARY ANGIOGRAPHY;  Surgeon: Kelli Bush, MD;  Location: [facility];  Service: Cardiovascular;  Laterality: N/A;   LUMBAR FUSION     "put rods in"   LUMBAR LAMINECTOMY/DECOMPRESSION MICRODISCECTOMY  05/2006   OPEN REDUCTION INTERNAL FIXATION (ORIF) DISTAL RADIAL FRACTURE Left 06/18/2018   Procedure: OPEN REDUCTION INTERNAL FIXATION (ORIF) DISTAL RADIAL FRACTURE repair and/or reconstruction;  Surgeon: Kelli Planas, MD;  Location: [facility];  Service: Orthopedics;  Laterality: Left;   REVISION TOTAL KNEE ARTHROPLASTY Bilateral    RIGHT OOPHORECTOMY Right    SHOULDER OPEN ROTATOR CUFF REPAIR Bilateral    left 04/2006   TONSILLECTOMY     TOTAL KNEE ARTHROPLASTY Bilateral    left 1998; right 2004   TUBAL LIGATION       SOCIAL HISTORY: Social History   Socioeconomic History   Marital status: Single    Spouse name: Not on file   Number of children: 0   Years of education: college   Highest education level: Not on file  Occupational History    Comment: retired  Tobacco Use   Smoking status: Never   Smokeless tobacco: Never  Vaping Use   Vaping Use: Never used  Substance and Sexual Activity   Alcohol use: Yes    Comment: occas   Drug use: No   Sexual activity: Yes  Other Topics Concern   Not on file  Social History Narrative   Lives with Michaele Offer.     Education: college.  Children none.     Retired and Disabled.     Caffeine Drinks tea.   Social Determinants of Health   Financial Resource Strain: Not on file  Food Insecurity: Not on file  Transportation Needs: Not on file  Physical Activity: Not on file  Stress: Not on file  Social Connections: Not on file  Intimate Partner Violence: Not on file    FAMILY HISTORY: Family History  Problem Relation Age of Onset   COPD Mother    Emphysema Mother    CAD Father 27   Lung cancer Father    Heart disease Father    Other Sister        TRAP syndrome   Diabetes Sister        boarderline   Iron deficiency Sister     ALLERGIES:  is allergic to aspirin, oxybutynin, and shellfish allergy.  MEDICATIONS:  Current Outpatient Medications  Medication Sig Dispense Refill   Acetaminophen-Caffeine (EXCEDRIN ASPIRIN FREE PO) Take 2 tablets by mouth 3 (three) times daily as needed (pain).      albuterol (VENTOLIN HFA) 108 (90 Base) MCG/ACT inhaler Inhale 2 puffs into the lungs every 4 (four) hours as needed for wheezing or shortness of breath.      ALPRAZolam (XANAX) 1 MG tablet Take 1 tablet by mouth 3 (three) times daily as needed.     amLODipine (NORVASC) 5 MG tablet Take 1 tablet (5 mg total) by mouth daily. 90 tablet 3   baclofen (LIORESAL) 10 MG tablet Take 10 mg by mouth 3 (three) times daily as needed.     DULoxetine  (CYMBALTA) 20 MG capsule Take 20 mg by mouth 2 (two) times daily.     famotidine (PEPCID) 20 MG tablet Take 20 mg by mouth 2 (two) times daily.      FLUoxetine (PROZAC) 20 MG capsule Take 20 mg by mouth in the morning, at noon, and at bedtime.      furosemide (LASIX) 20 MG tablet Take 10-20 mg by mouth daily as needed for fluid or edema.      gabapentin (NEURONTIN) 300 MG capsule Take 300 mg by mouth 3 (three) times daily.     HYDROcodone-acetaminophen (NORCO) 10-325 MG tablet Take 1 tablet by mouth 4 (four) times daily as needed.     lisinopril (ZESTRIL) 10 MG tablet Take 10 mg by mouth daily.     meloxicam (MOBIC) 15 MG tablet Take 15 mg by mouth daily.      nitroGLYCERIN (NITROSTAT) 0.4 MG SL tablet Place 1 tablet (0.4 mg total) under the tongue every 5 (five) minutes x 3 doses as needed for chest pain. 25 tablet 2   omeprazole (PRILOSEC) 20 MG capsule Take 20 mg by mouth 2 (two) times daily.     potassium chloride (MICRO-K) 10 MEQ CR capsule Take 20 mEq by mouth 2 (two) times daily.     promethazine (PHENERGAN) 25 MG tablet Take 12.5 mg by mouth every 8 (eight) hours as needed for nausea or vomiting.      rosuvastatin (CRESTOR) 20 MG tablet TAKE 1 TABLET BY MOUTH ONCE DAILY . APPOINTMENT REQUIRED FOR FUTURE REFILLS 30 tablet 0   tiZANidine (ZANAFLEX) 4 MG tablet Take 4 mg by mouth 2 (two) times daily as needed for muscle spasms.      FERREX 150 150 MG capsule Take 150 mg by mouth daily.     Current Facility-Administered Medications  Medication Dose Route Frequency Provider Last Rate Last Admin   sodium  chloride flush (NS) 0.9 % injection 3 mL  3 mL Intravenous Q12H Marylu Lund., NP        REVIEW OF SYSTEMS:   10 Point review of Systems was done is negative except as noted above.  PHYSICAL EXAMINATION:  Vitals:   02/07/22 1235  BP: (!) 155/79  Pulse: (!) 57  Resp: 17  Temp: 97.7 F (36.5 C)  SpO2: 99%   Filed Weights   02/07/22 1235  Weight: 205 lb 12.8 oz (93.4 kg)    Body mass index is 36.46 kg/m. NAD GENERAL:alert, in no acute distress and comfortable SKIN: no acute rashes, no significant lesions EYES: conjunctiva are pink and non-injected, sclera anicteric OROPHARYNX: MMM, no exudates, no oropharyngeal erythema or ulceration NECK: supple, no JVD LYMPH:  no palpable lymphadenopathy in the cervical, axillary or inguinal regions LUNGS: clear to auscultation b/l with normal respiratory effort HEART: regular rate & rhythm ABDOMEN:  normoactive bowel sounds , non tender, not distended. Extremity: no pedal edema PSYCH: alert & oriented x 3 with fluent speech NEURO: no focal motor/sensory deficits   LABORATORY DATA:  I have reviewed the data as listed  .    Latest Ref Rng & Units 02/07/2022    1:51 PM 01/03/2022    2:54 PM 03/13/2020   11:40 AM  CBC  WBC 4.0 - 10.5 K/uL 3.5  4.9  4.9   Hemoglobin 12.0 - 15.0 g/dL 9.3  8.8  12.0   Hematocrit 36.0 - 46.0 % 30.3  28.5  36.3   Platelets 150 - 400 K/uL 171  184  196     .    Latest Ref Rng & Units 02/07/2022    1:51 PM 01/03/2022    2:54 PM 03/13/2020   11:40 AM  CMP  Glucose 70 - 99 mg/dL 94  84  99   BUN 8 - 23 mg/dL 13  10  19    Creatinine 0.44 - 1.00 mg/dL 0.75  0.79  1.09   Sodium 135 - 145 mmol/L 144  141  141   Potassium 3.5 - 5.1 mmol/L 3.2  4.1  4.8   Chloride 98 - 111 mmol/L 108  108  103   CO2 22 - 32 mmol/L 29  28  24    Calcium 8.9 - 10.3 mg/dL 9.0  9.3  9.7   Total Protein 6.5 - 8.1 g/dL 6.8     Total Bilirubin 0.3 - 1.2 mg/dL 0.3     Alkaline Phos 38 - 126 U/L 130     AST 15 - 41 U/L 17     ALT 0 - 44 U/L 11      Iron/TIBC/Ferritin/ %Sat    Component Value Date/Time   IRON 12 (L) 02/07/2022 1353   TIBC 456 (H) 02/07/2022 1353   FERRITIN 35 02/07/2022 1351   IRONPCTSAT 3 (L) 02/07/2022 1353    06/27/18 CBC:   Component     Latest Ref Rng & Units 11/06/2018  Retic Ct Pct     0.4 - 3.1 % 3.1  RBC.     3.87 - 5.11 MIL/uL 4.40  Retic Count, Absolute     19.0 -  186.0 K/uL 138.2  Immature Retic Fract     2.3 - 15.9 % 23.1 (H)  Folate, Hemolysate     Not Estab. ng/mL 371.0  HCT     34.0 - 46.6 % 32.3 (L)  Folate, RBC     >498 ng/mL 1,149  Vitamin B12  180 - 914 pg/mL 458  Sed Rate     0 - 22 mm/hr 19   RADIOGRAPHIC STUDIES: I have personally reviewed the radiological images as listed and agreed with the findings in the report. No results found.  ASSESSMENT & PLAN:   66 y.o. female with  1.  Severe microcytic anemia due to iron deficiency iron Deficiency  2.  Iron deficiency likely due to chronic GI losses and decreased iron absorption due to chronic PPI use  PLAN: Labs done today were discussed in detail with the patient. CBC shows hemoglobin of 9.3 with significant iron deficiency with ferritin of about 38 and iron saturation of only 3% B12 levels are adequate -He was recommended to follow-up with PCP for consideration of GI referral for possible GI work-up with endoscopy and colonoscopy. She notes that she has been taking her oral iron on and off  She is having fatigue and would like to pursue IV iron and we will set her up for IV Venofer 300 mg weekly x3 doses  Follow-up Labs today IV Venofer 300mg  weekly x 3 doses Phone visit with Dr Irene Limbo in 10 weeks with labs the day prior to phone visit  The total time spent in the appointment was 30 minutes*.  All of the patient's questions were answered with apparent satisfaction. The patient knows to call the clinic with any problems, questions or concerns.   Sullivan Lone MD MS AAHIVMS [facility] Hematology/Oncology Physician [facility]  .*Total Encounter Time as defined by the Centers for Medicare and Medicaid Services includes, in addition to the face-to-face time of a patient visit (documented in the note above) non-face-to-face time: obtaining and reviewing outside history, ordering and reviewing medications, tests or procedures, care coordination (communications with  other health care professionals or caregivers) and documentation in the medical record.

## 2022-02-15 ENCOUNTER — Other Ambulatory Visit: Payer: Self-pay | Admitting: Hematology

## 2022-02-17 ENCOUNTER — Ambulatory Visit: Payer: Medicare Other

## 2022-02-24 ENCOUNTER — Other Ambulatory Visit: Payer: Self-pay

## 2022-02-24 ENCOUNTER — Inpatient Hospital Stay: Payer: Medicare Other

## 2022-02-24 VITALS — BP 131/72 | HR 71 | Resp 16 | Ht 63.0 in | Wt 202.0 lb

## 2022-02-24 DIAGNOSIS — D509 Iron deficiency anemia, unspecified: Secondary | ICD-10-CM

## 2022-02-24 DIAGNOSIS — R5383 Other fatigue: Secondary | ICD-10-CM | POA: Diagnosis not present

## 2022-02-24 MED ORDER — LORATADINE 10 MG PO TABS
10.0000 mg | ORAL_TABLET | Freq: Once | ORAL | Status: AC
  Administered 2022-02-24: 10 mg via ORAL
  Filled 2022-02-24: qty 1

## 2022-02-24 MED ORDER — SODIUM CHLORIDE 0.9 % IV SOLN
300.0000 mg | Freq: Once | INTRAVENOUS | Status: AC
  Administered 2022-02-24: 300 mg via INTRAVENOUS
  Filled 2022-02-24: qty 300

## 2022-02-24 MED ORDER — SODIUM CHLORIDE 0.9 % IV SOLN: Freq: Once | INTRAVENOUS | Status: AC

## 2022-02-24 MED ORDER — ACETAMINOPHEN 325 MG PO TABS
650.0000 mg | ORAL_TABLET | Freq: Once | ORAL | Status: AC
  Administered 2022-02-24: 650 mg via ORAL
  Filled 2022-02-24: qty 2

## 2022-02-24 NOTE — Patient Instructions (Signed)

## 2022-03-02 DIAGNOSIS — Z79899 Other long term (current) drug therapy: Secondary | ICD-10-CM | POA: Diagnosis not present

## 2022-03-02 DIAGNOSIS — Z5181 Encounter for therapeutic drug level monitoring: Secondary | ICD-10-CM | POA: Diagnosis not present

## 2022-03-02 DIAGNOSIS — M5416 Radiculopathy, lumbar region: Secondary | ICD-10-CM | POA: Diagnosis not present

## 2022-03-02 DIAGNOSIS — M961 Postlaminectomy syndrome, not elsewhere classified: Secondary | ICD-10-CM | POA: Diagnosis not present

## 2022-03-03 ENCOUNTER — Inpatient Hospital Stay (HOSPITAL_BASED_OUTPATIENT_CLINIC_OR_DEPARTMENT_OTHER): Payer: Medicare Other

## 2022-03-03 ENCOUNTER — Other Ambulatory Visit: Payer: Self-pay

## 2022-03-03 VITALS — BP 136/76 | HR 60 | Temp 98.0°F | Resp 16

## 2022-03-03 DIAGNOSIS — D509 Iron deficiency anemia, unspecified: Secondary | ICD-10-CM | POA: Diagnosis not present

## 2022-03-03 DIAGNOSIS — R5383 Other fatigue: Secondary | ICD-10-CM | POA: Diagnosis not present

## 2022-03-03 MED ORDER — SODIUM CHLORIDE 0.9 % IV SOLN
300.0000 mg | Freq: Once | INTRAVENOUS | Status: AC
  Administered 2022-03-03: 300 mg via INTRAVENOUS
  Filled 2022-03-03: qty 300

## 2022-03-03 MED ORDER — SODIUM CHLORIDE 0.9 % IV SOLN: Freq: Once | INTRAVENOUS | Status: AC

## 2022-03-03 MED ORDER — ACETAMINOPHEN 325 MG PO TABS
650.0000 mg | ORAL_TABLET | Freq: Once | ORAL | Status: AC
  Administered 2022-03-03: 650 mg via ORAL
  Filled 2022-03-03: qty 2

## 2022-03-03 MED ORDER — LORATADINE 10 MG PO TABS
10.0000 mg | ORAL_TABLET | Freq: Once | ORAL | Status: AC
  Administered 2022-03-03: 10 mg via ORAL
  Filled 2022-03-03: qty 1

## 2022-03-03 NOTE — Patient Instructions (Signed)

## 2022-03-10 ENCOUNTER — Inpatient Hospital Stay: Payer: Medicare Other | Attending: Hematology

## 2022-03-10 VITALS — BP 125/75 | HR 78 | Temp 97.9°F | Resp 16

## 2022-03-10 DIAGNOSIS — D509 Iron deficiency anemia, unspecified: Secondary | ICD-10-CM | POA: Diagnosis not present

## 2022-03-10 DIAGNOSIS — Z79899 Other long term (current) drug therapy: Secondary | ICD-10-CM | POA: Insufficient documentation

## 2022-03-10 MED ORDER — SODIUM CHLORIDE 0.9 % IV SOLN: Freq: Once | INTRAVENOUS | Status: AC

## 2022-03-10 MED ORDER — SODIUM CHLORIDE 0.9 % IV SOLN
300.0000 mg | Freq: Once | INTRAVENOUS | Status: AC
  Administered 2022-03-10: 300 mg via INTRAVENOUS
  Filled 2022-03-10: qty 300

## 2022-03-10 MED ORDER — ACETAMINOPHEN 325 MG PO TABS
650.0000 mg | ORAL_TABLET | Freq: Once | ORAL | Status: AC
  Administered 2022-03-10: 650 mg via ORAL
  Filled 2022-03-10: qty 2

## 2022-03-10 MED ORDER — LORATADINE 10 MG PO TABS
10.0000 mg | ORAL_TABLET | Freq: Once | ORAL | Status: AC
  Administered 2022-03-10: 10 mg via ORAL
  Filled 2022-03-10: qty 1

## 2022-03-10 NOTE — Patient Instructions (Signed)

## 2022-03-30 DIAGNOSIS — G5603 Carpal tunnel syndrome, bilateral upper limbs: Secondary | ICD-10-CM | POA: Diagnosis not present

## 2022-04-14 DIAGNOSIS — M4802 Spinal stenosis, cervical region: Secondary | ICD-10-CM | POA: Diagnosis not present

## 2022-04-14 DIAGNOSIS — M47812 Spondylosis without myelopathy or radiculopathy, cervical region: Secondary | ICD-10-CM | POA: Diagnosis not present

## 2022-04-18 ENCOUNTER — Other Ambulatory Visit: Payer: Self-pay

## 2022-04-18 DIAGNOSIS — D509 Iron deficiency anemia, unspecified: Secondary | ICD-10-CM

## 2022-04-19 ENCOUNTER — Inpatient Hospital Stay: Payer: Medicare Other | Attending: Hematology

## 2022-04-19 DIAGNOSIS — N170 Acute kidney failure with tubular necrosis: Secondary | ICD-10-CM | POA: Diagnosis not present

## 2022-04-19 DIAGNOSIS — M25561 Pain in right knee: Secondary | ICD-10-CM | POA: Diagnosis not present

## 2022-04-19 DIAGNOSIS — M47816 Spondylosis without myelopathy or radiculopathy, lumbar region: Secondary | ICD-10-CM | POA: Diagnosis not present

## 2022-04-19 DIAGNOSIS — Z79899 Other long term (current) drug therapy: Secondary | ICD-10-CM | POA: Diagnosis not present

## 2022-04-19 DIAGNOSIS — R296 Repeated falls: Secondary | ICD-10-CM | POA: Diagnosis not present

## 2022-04-19 DIAGNOSIS — M2578 Osteophyte, vertebrae: Secondary | ICD-10-CM | POA: Diagnosis not present

## 2022-04-19 DIAGNOSIS — Z1152 Encounter for screening for COVID-19: Secondary | ICD-10-CM | POA: Diagnosis not present

## 2022-04-19 DIAGNOSIS — Z471 Aftercare following joint replacement surgery: Secondary | ICD-10-CM | POA: Diagnosis not present

## 2022-04-19 DIAGNOSIS — R9431 Abnormal electrocardiogram [ECG] [EKG]: Secondary | ICD-10-CM | POA: Diagnosis not present

## 2022-04-19 DIAGNOSIS — E785 Hyperlipidemia, unspecified: Secondary | ICD-10-CM | POA: Diagnosis not present

## 2022-04-19 DIAGNOSIS — R531 Weakness: Secondary | ICD-10-CM | POA: Diagnosis not present

## 2022-04-19 DIAGNOSIS — E860 Dehydration: Secondary | ICD-10-CM | POA: Diagnosis not present

## 2022-04-19 DIAGNOSIS — Z9181 History of falling: Secondary | ICD-10-CM | POA: Diagnosis not present

## 2022-04-19 DIAGNOSIS — Z96651 Presence of right artificial knee joint: Secondary | ICD-10-CM | POA: Diagnosis not present

## 2022-04-19 DIAGNOSIS — R519 Headache, unspecified: Secondary | ICD-10-CM | POA: Diagnosis not present

## 2022-04-19 DIAGNOSIS — S8000XA Contusion of unspecified knee, initial encounter: Secondary | ICD-10-CM | POA: Diagnosis not present

## 2022-04-19 DIAGNOSIS — Z743 Need for continuous supervision: Secondary | ICD-10-CM | POA: Diagnosis not present

## 2022-04-19 DIAGNOSIS — R339 Retention of urine, unspecified: Secondary | ICD-10-CM | POA: Diagnosis not present

## 2022-04-19 DIAGNOSIS — M47812 Spondylosis without myelopathy or radiculopathy, cervical region: Secondary | ICD-10-CM | POA: Diagnosis not present

## 2022-04-19 DIAGNOSIS — M6259 Muscle wasting and atrophy, not elsewhere classified, multiple sites: Secondary | ICD-10-CM | POA: Diagnosis not present

## 2022-04-19 DIAGNOSIS — Z7401 Bed confinement status: Secondary | ICD-10-CM | POA: Diagnosis not present

## 2022-04-19 DIAGNOSIS — S161XXA Strain of muscle, fascia and tendon at neck level, initial encounter: Secondary | ICD-10-CM | POA: Diagnosis not present

## 2022-04-19 DIAGNOSIS — E871 Hypo-osmolality and hyponatremia: Secondary | ICD-10-CM | POA: Diagnosis not present

## 2022-04-19 DIAGNOSIS — S0990XA Unspecified injury of head, initial encounter: Secondary | ICD-10-CM | POA: Diagnosis not present

## 2022-04-19 DIAGNOSIS — R0602 Shortness of breath: Secondary | ICD-10-CM | POA: Diagnosis not present

## 2022-04-19 DIAGNOSIS — D509 Iron deficiency anemia, unspecified: Secondary | ICD-10-CM | POA: Diagnosis not present

## 2022-04-19 DIAGNOSIS — G8929 Other chronic pain: Secondary | ICD-10-CM | POA: Diagnosis not present

## 2022-04-19 DIAGNOSIS — I10 Essential (primary) hypertension: Secondary | ICD-10-CM | POA: Diagnosis not present

## 2022-04-19 DIAGNOSIS — G629 Polyneuropathy, unspecified: Secondary | ICD-10-CM | POA: Diagnosis not present

## 2022-04-19 DIAGNOSIS — W19XXXA Unspecified fall, initial encounter: Secondary | ICD-10-CM | POA: Diagnosis not present

## 2022-04-19 DIAGNOSIS — R6889 Other general symptoms and signs: Secondary | ICD-10-CM | POA: Diagnosis not present

## 2022-04-19 DIAGNOSIS — M549 Dorsalgia, unspecified: Secondary | ICD-10-CM | POA: Diagnosis not present

## 2022-04-19 DIAGNOSIS — M4316 Spondylolisthesis, lumbar region: Secondary | ICD-10-CM | POA: Diagnosis not present

## 2022-04-19 DIAGNOSIS — R6520 Severe sepsis without septic shock: Secondary | ICD-10-CM | POA: Diagnosis not present

## 2022-04-19 DIAGNOSIS — Z792 Long term (current) use of antibiotics: Secondary | ICD-10-CM | POA: Diagnosis not present

## 2022-04-19 DIAGNOSIS — E876 Hypokalemia: Secondary | ICD-10-CM | POA: Diagnosis not present

## 2022-04-19 DIAGNOSIS — M1990 Unspecified osteoarthritis, unspecified site: Secondary | ICD-10-CM | POA: Diagnosis not present

## 2022-04-19 DIAGNOSIS — M25562 Pain in left knee: Secondary | ICD-10-CM | POA: Diagnosis not present

## 2022-04-19 DIAGNOSIS — M4802 Spinal stenosis, cervical region: Secondary | ICD-10-CM | POA: Diagnosis not present

## 2022-04-19 DIAGNOSIS — I672 Cerebral atherosclerosis: Secondary | ICD-10-CM | POA: Diagnosis not present

## 2022-04-19 DIAGNOSIS — S300XXA Contusion of lower back and pelvis, initial encounter: Secondary | ICD-10-CM | POA: Diagnosis not present

## 2022-04-19 DIAGNOSIS — M545 Low back pain, unspecified: Secondary | ICD-10-CM | POA: Diagnosis not present

## 2022-04-19 DIAGNOSIS — J9601 Acute respiratory failure with hypoxia: Secondary | ICD-10-CM | POA: Diagnosis not present

## 2022-04-19 DIAGNOSIS — K449 Diaphragmatic hernia without obstruction or gangrene: Secondary | ICD-10-CM | POA: Diagnosis not present

## 2022-04-19 DIAGNOSIS — F32A Depression, unspecified: Secondary | ICD-10-CM | POA: Diagnosis not present

## 2022-04-19 DIAGNOSIS — S199XXA Unspecified injury of neck, initial encounter: Secondary | ICD-10-CM | POA: Diagnosis not present

## 2022-04-19 DIAGNOSIS — A419 Sepsis, unspecified organism: Secondary | ICD-10-CM | POA: Diagnosis not present

## 2022-04-20 ENCOUNTER — Inpatient Hospital Stay: Payer: Medicare Other | Admitting: Hematology

## 2022-04-20 NOTE — Progress Notes (Shared)
HEMATOLOGY/ONCOLOGY CLINIC NOTE  Date of Service: 04/20/22   Patient Care Team: Georgann Housekeeper, MD as PCP - General (Internal Medicine) Lyn Records, MD as PCP - Cardiology (Cardiology)  CHIEF COMPLAINTS/PURPOSE OF CONSULTATION:  Delayed follow-up for anemia  HISTORY OF PRESENTING ILLNESS:   LAQUIESHA VANDENBURGH is a wonderful 66 y.o. female who has been referred to [place] by Dr. Georgann Housekeeper for evaluation and management of Anemia. The pt reports that she is doing well overall.   The pt reports that she has had "anemia off and on for the last 3-4 years, but last year was the worst ever." She started PO [place] slow release iron replacement for the last month, in addition to potassium replacement. She takes 3 pills of her PO iron per day (total of 135mg  elemental iron). Denies GI intolerance with iron replacement. She denies ice cravings or picca symptoms. She has not had IV iron in the past.  She has taken Omeprazole for 2-3 years and endorses "real bad acid reflux." She had a bleeding ulcer in 2018. She denies ulcers or concerns for GI bleeding recently, and denies blood in the stools or black stools. She notes that she recently had a colonoscopy and endoscopy in the last couple months with [facility] and notes that this was unremarkable. The pt notes that she has had two blood transfusions, one in 2018 with stomach ulcer bleed and second prior to a February 2020 surgery.  The pt had surgery on her left wrist in February 2020 after falling out of her bed and fracturing her bed. She will be having her bed lowered soon. She notes that she also fell a few weeks ago when she lost her balance, and sustained a cut on her forehead requiring stitches. She notes that she has poor balance and endorses peripheral neuropathy. She also endorses fibro myalgia.   The pt notes that she has migraines and uses Excedrin a few times each month.   She notes that she had less fatigue after her blood  transfusions, but has recently been feeling more tired. She denies dietary restrictions. She notes that she has a hard time swallowing occasionally, and has had her esophagus dilated once before. She notes that she doesn't have much of an appetite. She denies unexpected weight loss. She endorses staying very well hydrated with frequent water consumption.  Most recent lab results (06/27/18) of CBC is as follows: all values are WNL except for HGB at 9.6, HCT at 31.1, MCV at 67.9, MCH at 21.0, MCHC at 30.9, RDW at 25.3.  On review of systems, pt reports feeling tired, low appetite, acid reflux, poor balance, and denies picca symptoms, unexpected weight loss, noticing new lumps or bumps, new pain along the spine, abdominal pains, leg swelling, and any other symptoms.   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 On Social Hx the pt denies alcohol consumption or ever smoking cigarettes. On Family Hx the pt denies blood disorders  INTERVAL HISTORY:  .MADDISYN BURSTEIN is here for follow-up for continued evaluation and management of her anemia. .I connected with [name] on 04/20/2022 at  3:30 PM EST by telephone visit and verified that I am speaking with the correct person using two identifiers.   Patient was last seen by me on 02/07/2022 and complained of significant fatigue and exercise intolerance. She was seen 3 years ago before the visit on 02/07/2022. Her hemoglobin was down from 12 on 03/13/2020 to 8.8  on 01/03/2022 with an MCV of 75 normal platelet count of 184k and WBC count of 4.9, which was reason for visitation.     I discussed the limitations, risks, security and privacy concerns of performing an evaluation and management service by telemedicine and the availability of in-person appointments. I also discussed with the patient that there may be a patient responsible charge related to this service. The patient expressed understanding and agreed to proceed.   Other  persons participating in the visit and their role in the encounter: None   Patient's location: Home  Provider's location: [facility]   Chief Complaint: Anemia    MEDICAL HISTORY:  Past Medical History:  Diagnosis Date   Anemia    Anxiety    Arthritis    "all over" (11/16/2016)   CAD (coronary artery disease)    Chronic lower back pain    Chronic pain syndrome    Chronic pain syndrome    "all over" (11/16/2016)   CKD (chronic kidney disease), stage III (HCC)    Daily headache    Depression    Fibromyalgia    GERD (gastroesophageal reflux disease)    High cholesterol    History of blood transfusion    "related to hand laceration"   History of hiatal hernia    History of stomach ulcers    "bled when I was younger" (11/16/2016)   Hypercholesterolemia    Hypertension    hx; "they took me off my RX" (11/16/2016)   Migraine 1970s   Phlebitis of leg, right    "years ago" (11/16/2016)   Radiculopathy     SURGICAL HISTORY: Past Surgical History:  Procedure Laterality Date   ABDOMINAL HYSTERECTOMY  1993   w/left ovary   ANKLE FRACTURE SURGERY Bilateral    APPENDECTOMY  1977   BACK SURGERY     CARDIAC CATHETERIZATION  2005   "negative"   CHOLECYSTECTOMY OPEN  1977   ESOPHAGOGASTRODUODENOSCOPY  2008   dx'd GERD   FOREARM FRACTURE SURGERY Right    HIP SURGERY Bilateral    "took out bone spurs"   JOINT REPLACEMENT     KNEE ARTHROSCOPY Bilateral    "total of 18 times"   LACERATION REPAIR Right    hand; "went thru glass door"   LEFT HEART CATH AND CORONARY ANGIOGRAPHY N/A 11/18/2016   Procedure: Left Heart Cath and Coronary Angiography;  Surgeon: Nelva Bush, MD;  Location: [facility];  Service: Cardiovascular;  Laterality: N/A;   LEFT HEART CATH AND CORONARY ANGIOGRAPHY N/A 03/16/2020   Procedure: LEFT HEART CATH AND CORONARY ANGIOGRAPHY;  Surgeon: Nelva Bush, MD;  Location: [facility];  Service: Cardiovascular;  Laterality: N/A;   LUMBAR FUSION      "put rods in"   LUMBAR LAMINECTOMY/DECOMPRESSION MICRODISCECTOMY  05/2006   OPEN REDUCTION INTERNAL FIXATION (ORIF) DISTAL RADIAL FRACTURE Left 06/18/2018   Procedure: OPEN REDUCTION INTERNAL FIXATION (ORIF) DISTAL RADIAL FRACTURE repair and/or reconstruction;  Surgeon: Iran Planas, MD;  Location: [place];  Service: Orthopedics;  Laterality: Left;   REVISION TOTAL KNEE ARTHROPLASTY Bilateral    RIGHT OOPHORECTOMY Right    SHOULDER OPEN ROTATOR CUFF REPAIR Bilateral    left 04/2006   TONSILLECTOMY     TOTAL KNEE ARTHROPLASTY Bilateral    left 1998; right 2004   TUBAL LIGATION      SOCIAL HISTORY: Social History   Socioeconomic History   Marital status: Single    Spouse name: Not on file   Number of children:  0   Years of education: college   Highest education level: Not on file  Occupational History    Comment: retired  Tobacco Use   Smoking status: Never   Smokeless tobacco: Never  Vaping Use   Vaping Use: Never used  Substance and Sexual Activity   Alcohol use: Yes    Comment: occas   Drug use: No   Sexual activity: Yes  Other Topics Concern   Not on file  Social History Narrative   Lives with Michaele Offer.     Education: college.     Children none.     Retired and Disabled.     Caffeine Drinks tea.   Social Determinants of Health   Financial Resource Strain: Not on file  Food Insecurity: Not on file  Transportation Needs: Not on file  Physical Activity: Not on file  Stress: Not on file  Social Connections: Not on file  Intimate Partner Violence: Not on file    FAMILY HISTORY: Family History  Problem Relation Age of Onset   COPD Mother    Emphysema Mother    CAD Father 39   Lung cancer Father    Heart disease Father    Other Sister        TRAP syndrome   Diabetes Sister        boarderline   Iron deficiency Sister     ALLERGIES:  is allergic to aspirin, oxybutynin, and shellfish allergy.  MEDICATIONS:  Current Outpatient Medications  Medication  Sig Dispense Refill   Acetaminophen-Caffeine (EXCEDRIN ASPIRIN FREE PO) Take 2 tablets by mouth 3 (three) times daily as needed (pain).      albuterol (VENTOLIN HFA) 108 (90 Base) MCG/ACT inhaler Inhale 2 puffs into the lungs every 4 (four) hours as needed for wheezing or shortness of breath.      ALPRAZolam (XANAX) 1 MG tablet Take 1 tablet by mouth 3 (three) times daily as needed.     amLODipine (NORVASC) 5 MG tablet Take 1 tablet (5 mg total) by mouth daily. 90 tablet 3   baclofen (LIORESAL) 10 MG tablet Take 10 mg by mouth 3 (three) times daily as needed.     DULoxetine (CYMBALTA) 20 MG capsule Take 20 mg by mouth 2 (two) times daily.     famotidine (PEPCID) 20 MG tablet Take 20 mg by mouth 2 (two) times daily.      FERREX 150 150 MG capsule Take 150 mg by mouth daily.     FLUoxetine (PROZAC) 20 MG capsule Take 20 mg by mouth in the morning, at noon, and at bedtime.      furosemide (LASIX) 20 MG tablet Take 10-20 mg by mouth daily as needed for fluid or edema.      gabapentin (NEURONTIN) 300 MG capsule Take 300 mg by mouth 3 (three) times daily.     HYDROcodone-acetaminophen (NORCO) 10-325 MG tablet Take 1 tablet by mouth 4 (four) times daily as needed.     lisinopril (ZESTRIL) 10 MG tablet Take 10 mg by mouth daily.     meloxicam (MOBIC) 15 MG tablet Take 15 mg by mouth daily.      nitroGLYCERIN (NITROSTAT) 0.4 MG SL tablet Place 1 tablet (0.4 mg total) under the tongue every 5 (five) minutes x 3 doses as needed for chest pain. 25 tablet 2   omeprazole (PRILOSEC) 20 MG capsule Take 20 mg by mouth 2 (two) times daily.     potassium chloride (MICRO-K) 10 MEQ CR capsule Take 20  mEq by mouth 2 (two) times daily.     promethazine (PHENERGAN) 25 MG tablet Take 12.5 mg by mouth every 8 (eight) hours as needed for nausea or vomiting.      rosuvastatin (CRESTOR) 20 MG tablet TAKE 1 TABLET BY MOUTH ONCE DAILY . APPOINTMENT REQUIRED FOR FUTURE REFILLS 30 tablet 0   tiZANidine (ZANAFLEX) 4 MG tablet  Take 4 mg by mouth 2 (two) times daily as needed for muscle spasms.      Current Facility-Administered Medications  Medication Dose Route Frequency Provider Last Rate Last Admin   sodium chloride flush (NS) 0.9 % injection 3 mL  3 mL Intravenous Q12H Marylu Lund., NP        REVIEW OF SYSTEMS:   10 Point review of Systems was done is negative except as noted above.  PHYSICAL EXAMINATION:  There were no vitals filed for this visit.  There were no vitals filed for this visit.  There is no height or weight on file to calculate BMI. NAD GENERAL:alert, in no acute distress and comfortable SKIN: no acute rashes, no significant lesions EYES: conjunctiva are pink and non-injected, sclera anicteric OROPHARYNX: MMM, no exudates, no oropharyngeal erythema or ulceration NECK: supple, no JVD LYMPH:  no palpable lymphadenopathy in the cervical, axillary or inguinal regions LUNGS: clear to auscultation b/l with normal respiratory effort HEART: regular rate & rhythm ABDOMEN:  normoactive bowel sounds , non tender, not distended. Extremity: no pedal edema PSYCH: alert & oriented x 3 with fluent speech NEURO: no focal motor/sensory deficits   LABORATORY DATA:  I have reviewed the data as listed  .    Latest Ref Rng & Units 02/07/2022    1:51 PM 01/03/2022    2:54 PM 03/13/2020   11:40 AM  CBC  WBC 4.0 - 10.5 K/uL 3.5  4.9  4.9   Hemoglobin 12.0 - 15.0 g/dL 9.3  8.8  12.0   Hematocrit 36.0 - 46.0 % 30.3  28.5  36.3   Platelets 150 - 400 K/uL 171  184  196     .    Latest Ref Rng & Units 02/07/2022    1:51 PM 01/03/2022    2:54 PM 03/13/2020   11:40 AM  CMP  Glucose 70 - 99 mg/dL 94  84  99   BUN 8 - 23 mg/dL 13  10  19    Creatinine 0.44 - 1.00 mg/dL 0.75  0.79  1.09   Sodium 135 - 145 mmol/L 144  141  141   Potassium 3.5 - 5.1 mmol/L 3.2  4.1  4.8   Chloride 98 - 111 mmol/L 108  108  103   CO2 22 - 32 mmol/L 29  28  24    Calcium 8.9 - 10.3 mg/dL 9.0  9.3  9.7   Total  Protein 6.5 - 8.1 g/dL 6.8     Total Bilirubin 0.3 - 1.2 mg/dL 0.3     Alkaline Phos 38 - 126 U/L 130     AST 15 - 41 U/L 17     ALT 0 - 44 U/L 11      Iron/TIBC/Ferritin/ %Sat    Component Value Date/Time   IRON 12 (L) 02/07/2022 1353   TIBC 456 (H) 02/07/2022 1353   FERRITIN 35 02/07/2022 1351   IRONPCTSAT 3 (L) 02/07/2022 1353    06/27/18 CBC:   Component     Latest Ref Rng & Units 11/06/2018  Retic Ct Pct     0.4 -  3.1 % 3.1  RBC.     3.87 - 5.11 MIL/uL 4.40  Retic Count, Absolute     19.0 - 186.0 K/uL 138.2  Immature Retic Fract     2.3 - 15.9 % 23.1 (H)  Folate, Hemolysate     Not Estab. ng/mL 371.0  HCT     34.0 - 46.6 % 32.3 (L)  Folate, RBC     >498 ng/mL 1,149  Vitamin B12     180 - 914 pg/mL 458  Sed Rate     0 - 22 mm/hr 19   RADIOGRAPHIC STUDIES: I have personally reviewed the radiological images as listed and agreed with the findings in the report. No results found.  ASSESSMENT & PLAN:   66 y.o. female with  1.  Severe microcytic anemia due to iron deficiency iron Deficiency  2.  Iron deficiency likely due to chronic GI losses and decreased iron absorption due to chronic PPI use  PLAN: Labs done today were discussed in detail with the patient. CBC shows hemoglobin of 9.3 with significant iron deficiency with ferritin of about 38 and iron saturation of only 3% B12 levels are adequate -He was recommended to follow-up with PCP for consideration of GI referral for possible GI work-up with endoscopy and colonoscopy. She notes that she has been taking her oral iron on and off  She is having fatigue and would like to pursue IV iron and we will set her up for IV Venofer 300 mg weekly x3 doses  FOLLOW-UP: *** The total time spent in the appointment was *** minutes* .  All of the patient's questions were answered with apparent satisfaction. The patient knows to call the clinic with any problems, questions or concerns.   Wyvonnia Lora MD MS AAHIVMS [facility]  [facility] Hematology/Oncology Physician [facility]  .*Total Encounter Time as defined by the Centers for Medicare and Medicaid Services includes, in addition to the face-to-face time of a patient visit (documented in the note above) non-face-to-face time: obtaining and reviewing outside history, ordering and reviewing medications, tests or procedures, care coordination (communications with other health care professionals or caregivers) and documentation in the medical record.   Colonel Bald, am acting as a Neurosurgeon for Wyvonnia Lora, MD.

## 2022-04-23 DIAGNOSIS — R531 Weakness: Secondary | ICD-10-CM | POA: Diagnosis not present

## 2022-04-23 DIAGNOSIS — N170 Acute kidney failure with tubular necrosis: Secondary | ICD-10-CM | POA: Diagnosis not present

## 2022-04-23 DIAGNOSIS — I10 Essential (primary) hypertension: Secondary | ICD-10-CM | POA: Diagnosis not present

## 2022-04-23 DIAGNOSIS — N179 Acute kidney failure, unspecified: Secondary | ICD-10-CM | POA: Diagnosis not present

## 2022-04-23 DIAGNOSIS — Z7401 Bed confinement status: Secondary | ICD-10-CM | POA: Diagnosis not present

## 2022-04-23 DIAGNOSIS — R339 Retention of urine, unspecified: Secondary | ICD-10-CM | POA: Diagnosis not present

## 2022-04-23 DIAGNOSIS — R6889 Other general symptoms and signs: Secondary | ICD-10-CM | POA: Diagnosis not present

## 2022-04-23 DIAGNOSIS — M6281 Muscle weakness (generalized): Secondary | ICD-10-CM | POA: Diagnosis not present

## 2022-04-23 DIAGNOSIS — G629 Polyneuropathy, unspecified: Secondary | ICD-10-CM | POA: Diagnosis not present

## 2022-04-23 DIAGNOSIS — E559 Vitamin D deficiency, unspecified: Secondary | ICD-10-CM | POA: Diagnosis not present

## 2022-04-23 DIAGNOSIS — Z9181 History of falling: Secondary | ICD-10-CM | POA: Diagnosis not present

## 2022-04-23 DIAGNOSIS — E785 Hyperlipidemia, unspecified: Secondary | ICD-10-CM | POA: Diagnosis not present

## 2022-04-23 DIAGNOSIS — A419 Sepsis, unspecified organism: Secondary | ICD-10-CM | POA: Diagnosis not present

## 2022-04-23 DIAGNOSIS — D509 Iron deficiency anemia, unspecified: Secondary | ICD-10-CM | POA: Diagnosis not present

## 2022-04-23 DIAGNOSIS — G8929 Other chronic pain: Secondary | ICD-10-CM | POA: Diagnosis not present

## 2022-04-23 DIAGNOSIS — Z743 Need for continuous supervision: Secondary | ICD-10-CM | POA: Diagnosis not present

## 2022-04-23 DIAGNOSIS — J9601 Acute respiratory failure with hypoxia: Secondary | ICD-10-CM | POA: Diagnosis not present

## 2022-04-23 DIAGNOSIS — M6259 Muscle wasting and atrophy, not elsewhere classified, multiple sites: Secondary | ICD-10-CM | POA: Diagnosis not present

## 2022-04-24 DIAGNOSIS — D509 Iron deficiency anemia, unspecified: Secondary | ICD-10-CM | POA: Diagnosis not present

## 2022-04-24 DIAGNOSIS — J9601 Acute respiratory failure with hypoxia: Secondary | ICD-10-CM | POA: Diagnosis not present

## 2022-04-24 DIAGNOSIS — M6259 Muscle wasting and atrophy, not elsewhere classified, multiple sites: Secondary | ICD-10-CM | POA: Diagnosis not present

## 2022-04-26 ENCOUNTER — Encounter: Payer: Self-pay | Admitting: Hematology

## 2022-04-26 DIAGNOSIS — N179 Acute kidney failure, unspecified: Secondary | ICD-10-CM | POA: Diagnosis not present

## 2022-04-26 DIAGNOSIS — R339 Retention of urine, unspecified: Secondary | ICD-10-CM | POA: Diagnosis not present

## 2022-04-26 DIAGNOSIS — M6281 Muscle weakness (generalized): Secondary | ICD-10-CM | POA: Diagnosis not present

## 2022-04-26 NOTE — Progress Notes (Signed)
This encounter was created in error - please disregard.

## 2022-04-28 DIAGNOSIS — D509 Iron deficiency anemia, unspecified: Secondary | ICD-10-CM | POA: Diagnosis not present

## 2022-04-28 DIAGNOSIS — E559 Vitamin D deficiency, unspecified: Secondary | ICD-10-CM | POA: Diagnosis not present

## 2022-04-28 DIAGNOSIS — J9601 Acute respiratory failure with hypoxia: Secondary | ICD-10-CM | POA: Diagnosis not present

## 2022-04-29 DIAGNOSIS — G629 Polyneuropathy, unspecified: Secondary | ICD-10-CM | POA: Diagnosis not present

## 2022-04-29 DIAGNOSIS — G8929 Other chronic pain: Secondary | ICD-10-CM | POA: Diagnosis not present

## 2022-04-29 DIAGNOSIS — M6281 Muscle weakness (generalized): Secondary | ICD-10-CM | POA: Diagnosis not present

## 2022-05-16 ENCOUNTER — Inpatient Hospital Stay: Payer: Medicare Other | Attending: Hematology

## 2022-05-16 DIAGNOSIS — M5136 Other intervertebral disc degeneration, lumbar region: Secondary | ICD-10-CM | POA: Diagnosis not present

## 2022-05-16 DIAGNOSIS — M5090 Cervical disc disorder, unspecified, unspecified cervical region: Secondary | ICD-10-CM | POA: Diagnosis not present

## 2022-05-16 DIAGNOSIS — R339 Retention of urine, unspecified: Secondary | ICD-10-CM | POA: Diagnosis not present

## 2022-05-16 DIAGNOSIS — N183 Chronic kidney disease, stage 3 unspecified: Secondary | ICD-10-CM | POA: Diagnosis not present

## 2022-05-16 DIAGNOSIS — I2510 Atherosclerotic heart disease of native coronary artery without angina pectoris: Secondary | ICD-10-CM | POA: Diagnosis not present

## 2022-05-16 DIAGNOSIS — E876 Hypokalemia: Secondary | ICD-10-CM | POA: Diagnosis not present

## 2022-05-16 DIAGNOSIS — R269 Unspecified abnormalities of gait and mobility: Secondary | ICD-10-CM | POA: Diagnosis not present

## 2022-05-16 DIAGNOSIS — G894 Chronic pain syndrome: Secondary | ICD-10-CM | POA: Diagnosis not present

## 2022-05-16 DIAGNOSIS — R7303 Prediabetes: Secondary | ICD-10-CM | POA: Diagnosis not present

## 2022-05-16 DIAGNOSIS — I10 Essential (primary) hypertension: Secondary | ICD-10-CM | POA: Diagnosis not present

## 2022-05-16 DIAGNOSIS — D509 Iron deficiency anemia, unspecified: Secondary | ICD-10-CM | POA: Insufficient documentation

## 2022-05-18 ENCOUNTER — Other Ambulatory Visit: Payer: Self-pay

## 2022-05-18 ENCOUNTER — Inpatient Hospital Stay: Payer: Medicare Other | Admitting: Hematology

## 2022-05-18 DIAGNOSIS — D509 Iron deficiency anemia, unspecified: Secondary | ICD-10-CM

## 2022-05-18 NOTE — Progress Notes (Signed)
Pt contacted to change MD phone appointment for today. Pt did not have labs done prior. Pt to have labs done on 06/01/22 at 2 pm and MD phone appointment on 06/06/22 at 3:30 pm. Pt acknowledged information and verbalized understanding.

## 2022-05-19 DIAGNOSIS — H16223 Keratoconjunctivitis sicca, not specified as Sjogren's, bilateral: Secondary | ICD-10-CM | POA: Diagnosis not present

## 2022-05-24 DIAGNOSIS — R2681 Unsteadiness on feet: Secondary | ICD-10-CM | POA: Diagnosis not present

## 2022-05-31 DIAGNOSIS — M40204 Unspecified kyphosis, thoracic region: Secondary | ICD-10-CM | POA: Diagnosis not present

## 2022-05-31 DIAGNOSIS — M47814 Spondylosis without myelopathy or radiculopathy, thoracic region: Secondary | ICD-10-CM | POA: Diagnosis not present

## 2022-05-31 DIAGNOSIS — R2681 Unsteadiness on feet: Secondary | ICD-10-CM | POA: Diagnosis not present

## 2022-05-31 DIAGNOSIS — M549 Dorsalgia, unspecified: Secondary | ICD-10-CM | POA: Diagnosis not present

## 2022-06-01 ENCOUNTER — Other Ambulatory Visit: Payer: Self-pay

## 2022-06-01 ENCOUNTER — Inpatient Hospital Stay: Payer: Medicare Other

## 2022-06-01 DIAGNOSIS — D509 Iron deficiency anemia, unspecified: Secondary | ICD-10-CM | POA: Diagnosis not present

## 2022-06-01 DIAGNOSIS — M5416 Radiculopathy, lumbar region: Secondary | ICD-10-CM | POA: Diagnosis not present

## 2022-06-01 DIAGNOSIS — M961 Postlaminectomy syndrome, not elsewhere classified: Secondary | ICD-10-CM | POA: Diagnosis not present

## 2022-06-01 LAB — CBC WITH DIFFERENTIAL (CANCER CENTER ONLY)
Abs Immature Granulocytes: 0.01 10*3/uL (ref 0.00–0.07)
Basophils Absolute: 0.1 10*3/uL (ref 0.0–0.1)
Basophils Relative: 2 %
Eosinophils Absolute: 0.3 10*3/uL (ref 0.0–0.5)
Eosinophils Relative: 7 %
HCT: 36.8 % (ref 36.0–46.0)
Hemoglobin: 11.7 g/dL — ABNORMAL LOW (ref 12.0–15.0)
Immature Granulocytes: 0 %
Lymphocytes Relative: 28 %
Lymphs Abs: 1.2 10*3/uL (ref 0.7–4.0)
MCH: 26.2 pg (ref 26.0–34.0)
MCHC: 31.8 g/dL (ref 30.0–36.0)
MCV: 82.5 fL (ref 80.0–100.0)
Monocytes Absolute: 0.4 10*3/uL (ref 0.1–1.0)
Monocytes Relative: 9 %
Neutro Abs: 2.4 10*3/uL (ref 1.7–7.7)
Neutrophils Relative %: 54 %
Platelet Count: 185 10*3/uL (ref 150–400)
RBC: 4.46 MIL/uL (ref 3.87–5.11)
RDW: 16.3 % — ABNORMAL HIGH (ref 11.5–15.5)
WBC Count: 4.4 10*3/uL (ref 4.0–10.5)
nRBC: 0 % (ref 0.0–0.2)

## 2022-06-01 LAB — CMP (CANCER CENTER ONLY)
ALT: 15 U/L (ref 0–44)
AST: 21 U/L (ref 15–41)
Albumin: 4.3 g/dL (ref 3.5–5.0)
Alkaline Phosphatase: 160 U/L — ABNORMAL HIGH (ref 38–126)
Anion gap: 9 (ref 5–15)
BUN: 15 mg/dL (ref 8–23)
CO2: 26 mmol/L (ref 22–32)
Calcium: 9.5 mg/dL (ref 8.9–10.3)
Chloride: 106 mmol/L (ref 98–111)
Creatinine: 1.01 mg/dL — ABNORMAL HIGH (ref 0.44–1.00)
GFR, Estimated: >60 mL/min (ref >60)
Glucose, Bld: 90 mg/dL (ref 70–99)
Potassium: 4.1 mmol/L (ref 3.5–5.1)
Sodium: 141 mmol/L (ref 135–145)
Total Bilirubin: 0.1 mg/dL — ABNORMAL LOW (ref 0.3–1.2)
Total Protein: 7.9 g/dL (ref 6.5–8.1)

## 2022-06-01 LAB — IRON AND IRON BINDING CAPACITY (CC-WL,HP ONLY)
Iron: 57 ug/dL (ref 28–170)
Saturation Ratios: 14 % (ref 10.4–31.8)
TIBC: 407 ug/dL (ref 250–450)
UIBC: 350 ug/dL

## 2022-06-01 LAB — FERRITIN: Ferritin: 59 ng/mL (ref 11–307)

## 2022-06-01 LAB — VITAMIN B12: Vitamin B-12: 876 pg/mL (ref 180–914)

## 2022-06-02 DIAGNOSIS — R2681 Unsteadiness on feet: Secondary | ICD-10-CM | POA: Diagnosis not present

## 2022-06-02 DIAGNOSIS — R531 Weakness: Secondary | ICD-10-CM | POA: Diagnosis not present

## 2022-06-02 DIAGNOSIS — J9601 Acute respiratory failure with hypoxia: Secondary | ICD-10-CM | POA: Diagnosis not present

## 2022-06-02 DIAGNOSIS — R208 Other disturbances of skin sensation: Secondary | ICD-10-CM | POA: Diagnosis not present

## 2022-06-06 ENCOUNTER — Inpatient Hospital Stay (HOSPITAL_BASED_OUTPATIENT_CLINIC_OR_DEPARTMENT_OTHER): Payer: Medicare Other | Admitting: Hematology

## 2022-06-06 DIAGNOSIS — D509 Iron deficiency anemia, unspecified: Secondary | ICD-10-CM

## 2022-06-06 NOTE — Progress Notes (Signed)
HEMATOLOGY/ONCOLOGY CLINIC NOTE  Date of Service: 06/06/22   Patient Care Team: [name], MD as PCP - General (Internal Medicine) Belva Crome, MD as PCP - Cardiology (Cardiology)  CHIEF COMPLAINTS/PURPOSE OF CONSULTATION:  Delayed follow-up for anemia  HISTORY OF PRESENTING ILLNESS:   [name] is a wonderful 67 y.o. female who has been referred to [place] by Dr. [name] for evaluation and management of Anemia. The pt reports that she is doing well overall.   The pt reports that she has had "anemia off and on for the last 3-4 years, but last year was the worst ever." She started Halbur slow release iron replacement for the last month, in addition to potassium replacement. She takes 3 pills of her PO iron per day (total of 135mg  elemental iron). Denies GI intolerance with iron replacement. She denies ice cravings or picca symptoms. She has not had IV iron in the past.  She has taken Omeprazole for 2-3 years and endorses "real bad acid reflux." She had a bleeding ulcer in 2018. She denies ulcers or concerns for GI bleeding recently, and denies blood in the stools or black stools. She notes that she recently had a colonoscopy and endoscopy in the last couple months with [facility] and notes that this was unremarkable. The pt notes that she has had two blood transfusions, one in 2018 with stomach ulcer bleed and second prior to a February 2020 surgery.  The pt had surgery on her left wrist in February 2020 after falling out of her bed and fracturing her bed. She will be having her bed lowered soon. She notes that she also fell a few weeks ago when she lost her balance, and sustained a cut on her forehead requiring stitches. She notes that she has poor balance and endorses peripheral neuropathy. She also endorses fibro myalgia.   The pt notes that she has migraines and uses Excedrin a few times each month.   She notes that she had less fatigue after her blood  transfusions, but has recently been feeling more tired. She denies dietary restrictions. She notes that she has a hard time swallowing occasionally, and has had her esophagus dilated once before. She notes that she doesn't have much of an appetite. She denies unexpected weight loss. She endorses staying very well hydrated with frequent water consumption.  Most recent lab results (06/27/18) of CBC is as follows: all values are WNL except for HGB at 9.6, HCT at 31.1, MCV at 67.9, MCH at 21.0, MCHC at 30.9, RDW at 25.3.  On review of systems, pt reports feeling tired, low appetite, acid reflux, poor balance, and denies picca symptoms, unexpected weight loss, noticing new lumps or bumps, new pain along the spine, abdominal pains, leg swelling, and any other symptoms.   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 On Social Hx the pt denies alcohol consumption or ever smoking cigarettes. On Family Hx the pt denies blood disorders  INTERVAL HISTORY:  .[name] is here for follow-up for continued evaluation and management of her anemia.  .I connected with [name] on 06/06/2022 at  3:30 PM EST by telephone visit and verified that I am speaking with the correct person using two identifiers.   Patient was last seen by me on 02/07/2022 and she complained of significant fatigue.   Patient reports she has been doing well overall without any new medical concerns since our last visit. She reports of  upset stomach with IV Iron infusion. Patient also had nausea. She had upset stomach and nausea after each IV Iron infusion. She complains of chronic nausea and is taking Zanaflex 4 mg.   She denies fever, chills, night sweats, nausea, fatigue, unexpected weight loss, abnormal bowel movement, black stool, blood in stool, hematuria, abnormal bleeding, back pain, chest pain, or leg swelling.   She does not take iron supplement anymore.   Patient has an upcoming appointment with  the Gastroenterologists .  I discussed the limitations, risks, security and privacy concerns of performing an evaluation and management service by telemedicine and the availability of in-person appointments. I also discussed with the patient that there may be a patient responsible charge related to this service. The patient expressed understanding and agreed to proceed.   Other persons participating in the visit and their role in the encounter: None   Patient's location: Home Provider's location: [facility]  Chief Complaint: Anemia.    MEDICAL HISTORY:  Past Medical History:  Diagnosis Date   Anemia    Anxiety    Arthritis    "all over" (11/16/2016)   CAD (coronary artery disease)    Chronic lower back pain    Chronic pain syndrome    Chronic pain syndrome    "all over" (11/16/2016)   CKD (chronic kidney disease), stage III (HCC)    Daily headache    Depression    Fibromyalgia    GERD (gastroesophageal reflux disease)    High cholesterol    History of blood transfusion    "related to hand laceration"   History of hiatal hernia    History of stomach ulcers    "bled when I was younger" (11/16/2016)   Hypercholesterolemia    Hypertension    hx; "they took me off my RX" (11/16/2016)   Migraine 1970s   Phlebitis of leg, right    "years ago" (11/16/2016)   Radiculopathy     SURGICAL HISTORY: Past Surgical History:  Procedure Laterality Date   ABDOMINAL HYSTERECTOMY  1993   w/left ovary   ANKLE FRACTURE SURGERY Bilateral    APPENDECTOMY  1977   BACK SURGERY     CARDIAC CATHETERIZATION  2005   "negative"   CHOLECYSTECTOMY OPEN  1977   ESOPHAGOGASTRODUODENOSCOPY  2008   dx'd GERD   FOREARM FRACTURE SURGERY Right    HIP SURGERY Bilateral    "took out bone spurs"   JOINT REPLACEMENT     KNEE ARTHROSCOPY Bilateral    "total of 18 times"   LACERATION REPAIR Right    hand; "went thru glass door"   LEFT HEART CATH AND CORONARY ANGIOGRAPHY N/A 11/18/2016   Procedure: Left Heart  Cath and Coronary Angiography;  Surgeon: Yvonne Kendall, MD;  Location: [facility] INVASIVE CV LAB;  Service: Cardiovascular;  Laterality: N/A;   LEFT HEART CATH AND CORONARY ANGIOGRAPHY N/A 03/16/2020   Procedure: LEFT HEART CATH AND CORONARY ANGIOGRAPHY;  Surgeon: Yvonne Kendall, MD;  Location: [facility] INVASIVE CV LAB;  Service: Cardiovascular;  Laterality: N/A;   LUMBAR FUSION     "put rods in"   LUMBAR LAMINECTOMY/DECOMPRESSION MICRODISCECTOMY  05/2006   OPEN REDUCTION INTERNAL FIXATION (ORIF) DISTAL RADIAL FRACTURE Left 06/18/2018   Procedure: OPEN REDUCTION INTERNAL FIXATION (ORIF) DISTAL RADIAL FRACTURE repair and/or reconstruction;  Surgeon: Bradly Bienenstock, MD;  Location: [facility] OR;  Service: Orthopedics;  Laterality: Left;   REVISION TOTAL KNEE ARTHROPLASTY Bilateral    RIGHT OOPHORECTOMY Right    SHOULDER OPEN ROTATOR CUFF REPAIR Bilateral  left 04/2006   TONSILLECTOMY     TOTAL KNEE ARTHROPLASTY Bilateral    left 1998; right 2004   TUBAL LIGATION      SOCIAL HISTORY: Social History   Socioeconomic History   Marital status: Single    Spouse name: Not on file   Number of children: 0   Years of education: college   Highest education level: Not on file  Occupational History    Comment: retired  Tobacco Use   Smoking status: Never   Smokeless tobacco: Never  Vaping Use   Vaping Use: Never used  Substance and Sexual Activity   Alcohol use: Yes    Comment: occas   Drug use: No   Sexual activity: Yes  Other Topics Concern   Not on file  Social History Narrative   Lives with Myrla Halsted.     Education: college.     Children none.     Retired and Disabled.     Caffeine Drinks tea.   Social Determinants of Health   Financial Resource Strain: Not on file  Food Insecurity: Not on file  Transportation Needs: Not on file  Physical Activity: Not on file  Stress: Not on file  Social Connections: Not on file  Intimate Partner Violence: Not on file    FAMILY HISTORY: Family  History  Problem Relation Age of Onset   COPD Mother    Emphysema Mother    CAD Father 60   Lung cancer Father    Heart disease Father    Other Sister        TRAP syndrome   Diabetes Sister        boarderline   Iron deficiency Sister     ALLERGIES:  is allergic to aspirin, oxybutynin, and shellfish allergy.  MEDICATIONS:  Current Outpatient Medications  Medication Sig Dispense Refill   Acetaminophen-Caffeine (EXCEDRIN ASPIRIN FREE PO) Take 2 tablets by mouth 3 (three) times daily as needed (pain).      albuterol (VENTOLIN HFA) 108 (90 Base) MCG/ACT inhaler Inhale 2 puffs into the lungs every 4 (four) hours as needed for wheezing or shortness of breath.      ALPRAZolam (XANAX) 1 MG tablet Take 1 tablet by mouth 3 (three) times daily as needed.     amLODipine (NORVASC) 5 MG tablet Take 1 tablet (5 mg total) by mouth daily. 90 tablet 3   baclofen (LIORESAL) 10 MG tablet Take 10 mg by mouth 3 (three) times daily as needed.     DULoxetine (CYMBALTA) 20 MG capsule Take 20 mg by mouth 2 (two) times daily.     famotidine (PEPCID) 20 MG tablet Take 20 mg by mouth 2 (two) times daily.      FERREX 150 150 MG capsule Take 150 mg by mouth daily.     FLUoxetine (PROZAC) 20 MG capsule Take 20 mg by mouth in the morning, at noon, and at bedtime.      furosemide (LASIX) 20 MG tablet Take 10-20 mg by mouth daily as needed for fluid or edema.      gabapentin (NEURONTIN) 300 MG capsule Take 300 mg by mouth 3 (three) times daily.     HYDROcodone-acetaminophen (NORCO) 10-325 MG tablet Take 1 tablet by mouth 4 (four) times daily as needed.     lisinopril (ZESTRIL) 10 MG tablet Take 10 mg by mouth daily.     meloxicam (MOBIC) 15 MG tablet Take 15 mg by mouth daily.      nitroGLYCERIN (NITROSTAT) 0.4 MG  SL tablet Place 1 tablet (0.4 mg total) under the tongue every 5 (five) minutes x 3 doses as needed for chest pain. 25 tablet 2   omeprazole (PRILOSEC) 20 MG capsule Take 20 mg by mouth 2 (two) times daily.      potassium chloride (MICRO-K) 10 MEQ CR capsule Take 20 mEq by mouth 2 (two) times daily.     promethazine (PHENERGAN) 25 MG tablet Take 12.5 mg by mouth every 8 (eight) hours as needed for nausea or vomiting.      rosuvastatin (CRESTOR) 20 MG tablet TAKE 1 TABLET BY MOUTH ONCE DAILY . APPOINTMENT REQUIRED FOR FUTURE REFILLS 30 tablet 0   tiZANidine (ZANAFLEX) 4 MG tablet Take 4 mg by mouth 2 (two) times daily as needed for muscle spasms.      Current Facility-Administered Medications  Medication Dose Route Frequency Provider Last Rate Last Admin   sodium chloride flush (NS) 0.9 % injection 3 mL  3 mL Intravenous Q12H Marylu Lund., NP        REVIEW OF SYSTEMS:   10 Point review of Systems was done is negative except as noted above.  PHYSICAL EXAMINATION: Telemedicine visit  LABORATORY DATA:  I have reviewed the data as listed  .    Latest Ref Rng & Units 06/01/2022    2:12 PM 02/07/2022    1:51 PM 01/03/2022    2:54 PM  CBC  WBC 4.0 - 10.5 K/uL 4.4  3.5  4.9   Hemoglobin 12.0 - 15.0 g/dL 11.7  9.3  8.8   Hematocrit 36.0 - 46.0 % 36.8  30.3  28.5   Platelets 150 - 400 K/uL 185  171  184     .    Latest Ref Rng & Units 06/01/2022    2:12 PM 02/07/2022    1:51 PM 01/03/2022    2:54 PM  CMP  Glucose 70 - 99 mg/dL 90  94  84   BUN 8 - 23 mg/dL 15  13  10    Creatinine 0.44 - 1.00 mg/dL 1.01  0.75  0.79   Sodium 135 - 145 mmol/L 141  144  141   Potassium 3.5 - 5.1 mmol/L 4.1  3.2  4.1   Chloride 98 - 111 mmol/L 106  108  108   CO2 22 - 32 mmol/L 26  29  28    Calcium 8.9 - 10.3 mg/dL 9.5  9.0  9.3   Total Protein 6.5 - 8.1 g/dL 7.9  6.8    Total Bilirubin 0.3 - 1.2 mg/dL 0.1  0.3    Alkaline Phos 38 - 126 U/L 160  130    AST 15 - 41 U/L 21  17    ALT 0 - 44 U/L 15  11     Iron/TIBC/Ferritin/ %Sat    Component Value Date/Time   IRON 57 06/01/2022 1412   TIBC 407 06/01/2022 1412   FERRITIN 59 06/01/2022 1412   IRONPCTSAT 14 06/01/2022 1412    06/27/18  CBC:   Component     Latest Ref Rng & Units 11/06/2018  Retic Ct Pct     0.4 - 3.1 % 3.1  RBC.     3.87 - 5.11 MIL/uL 4.40  Retic Count, Absolute     19.0 - 186.0 K/uL 138.2  Immature Retic Fract     2.3 - 15.9 % 23.1 (H)  Folate, Hemolysate     Not Estab. ng/mL 371.0  HCT     34.0 -  46.6 % 32.3 (L)  Folate, RBC     >498 ng/mL 1,149  Vitamin B12     180 - 914 pg/mL 458  Sed Rate     0 - 22 mm/hr 19   RADIOGRAPHIC STUDIES: I have personally reviewed the radiological images as listed and agreed with the findings in the report. No results found.  ASSESSMENT & PLAN:   67 y.o. female with  1.  Severe microcytic anemia due to iron deficiency iron Deficiency  2.  Iron deficiency likely due to chronic GI losses and decreased iron absorption due to chronic PPI use  PLAN: -discussed lab results from 06/01/2022. CBC is stable. CMP elevated Alkaline Phosphate level of 160. B-12 level is in the normal range.  -Iron saturation is up to 14%.with ferritin 59 - no indication for additional IV iron at this time. -patient prefers to take a conservative approach will monitor labs again in 6 months -Answered all of patient's questions.   FOLLOW-UP: RTC with Dr Candise Che with labs in 6 months  The total time spent in the appointment was 15 minutes* .  All of the patient's questions were answered with apparent satisfaction. The patient knows to call the clinic with any problems, questions or concerns.   Wyvonnia Lora MD MS AAHIVMS [facility] Hematology/Oncology Physician [facility]  .*Total Encounter Time as defined by the Centers for Medicare and Medicaid Services includes, in addition to the face-to-face time of a patient visit (documented in the note above) non-face-to-face time: obtaining and reviewing outside history, ordering and reviewing medications, tests or procedures, care coordination (communications with other health care professionals or caregivers) and documentation  in the medical record.   I, Ok Edwards, am acting as a Neurosurgeon for Wyvonnia Lora, MD.  .I have reviewed the above documentation for accuracy and completeness, and I agree with the above. Johney Maine MD

## 2022-06-07 DIAGNOSIS — M5416 Radiculopathy, lumbar region: Secondary | ICD-10-CM | POA: Diagnosis not present

## 2022-06-09 DIAGNOSIS — R2681 Unsteadiness on feet: Secondary | ICD-10-CM | POA: Diagnosis not present

## 2022-06-12 ENCOUNTER — Encounter: Payer: Self-pay | Admitting: Hematology

## 2022-06-17 DIAGNOSIS — R2681 Unsteadiness on feet: Secondary | ICD-10-CM | POA: Diagnosis not present

## 2022-06-17 DIAGNOSIS — R531 Weakness: Secondary | ICD-10-CM | POA: Diagnosis not present

## 2022-06-17 DIAGNOSIS — J9601 Acute respiratory failure with hypoxia: Secondary | ICD-10-CM | POA: Diagnosis not present

## 2022-06-17 DIAGNOSIS — R208 Other disturbances of skin sensation: Secondary | ICD-10-CM | POA: Diagnosis not present

## 2022-06-21 DIAGNOSIS — R2681 Unsteadiness on feet: Secondary | ICD-10-CM | POA: Diagnosis not present

## 2022-06-21 DIAGNOSIS — R208 Other disturbances of skin sensation: Secondary | ICD-10-CM | POA: Diagnosis not present

## 2022-06-21 DIAGNOSIS — K219 Gastro-esophageal reflux disease without esophagitis: Secondary | ICD-10-CM | POA: Diagnosis not present

## 2022-06-21 DIAGNOSIS — R531 Weakness: Secondary | ICD-10-CM | POA: Diagnosis not present

## 2022-06-21 DIAGNOSIS — K449 Diaphragmatic hernia without obstruction or gangrene: Secondary | ICD-10-CM | POA: Diagnosis not present

## 2022-06-21 DIAGNOSIS — D649 Anemia, unspecified: Secondary | ICD-10-CM | POA: Diagnosis not present

## 2022-06-21 DIAGNOSIS — J9601 Acute respiratory failure with hypoxia: Secondary | ICD-10-CM | POA: Diagnosis not present

## 2022-06-21 DIAGNOSIS — Z86010 Personal history of colonic polyps: Secondary | ICD-10-CM | POA: Diagnosis not present

## 2022-07-01 DIAGNOSIS — K648 Other hemorrhoids: Secondary | ICD-10-CM | POA: Diagnosis not present

## 2022-07-01 DIAGNOSIS — Z09 Encounter for follow-up examination after completed treatment for conditions other than malignant neoplasm: Secondary | ICD-10-CM | POA: Diagnosis not present

## 2022-07-01 DIAGNOSIS — D120 Benign neoplasm of cecum: Secondary | ICD-10-CM | POA: Diagnosis not present

## 2022-07-01 DIAGNOSIS — K3189 Other diseases of stomach and duodenum: Secondary | ICD-10-CM | POA: Diagnosis not present

## 2022-07-01 DIAGNOSIS — K319 Disease of stomach and duodenum, unspecified: Secondary | ICD-10-CM | POA: Diagnosis not present

## 2022-07-01 DIAGNOSIS — D509 Iron deficiency anemia, unspecified: Secondary | ICD-10-CM | POA: Diagnosis not present

## 2022-07-01 DIAGNOSIS — Z86010 Personal history of colonic polyps: Secondary | ICD-10-CM | POA: Diagnosis not present

## 2022-07-01 DIAGNOSIS — K5730 Diverticulosis of large intestine without perforation or abscess without bleeding: Secondary | ICD-10-CM | POA: Diagnosis not present

## 2022-07-01 DIAGNOSIS — R1310 Dysphagia, unspecified: Secondary | ICD-10-CM | POA: Diagnosis not present

## 2022-07-01 DIAGNOSIS — Q399 Congenital malformation of esophagus, unspecified: Secondary | ICD-10-CM | POA: Diagnosis not present

## 2022-07-01 DIAGNOSIS — K644 Residual hemorrhoidal skin tags: Secondary | ICD-10-CM | POA: Diagnosis not present

## 2022-07-01 DIAGNOSIS — D124 Benign neoplasm of descending colon: Secondary | ICD-10-CM | POA: Diagnosis not present

## 2022-07-05 DIAGNOSIS — R531 Weakness: Secondary | ICD-10-CM | POA: Diagnosis not present

## 2022-07-05 DIAGNOSIS — D124 Benign neoplasm of descending colon: Secondary | ICD-10-CM | POA: Diagnosis not present

## 2022-07-05 DIAGNOSIS — R2681 Unsteadiness on feet: Secondary | ICD-10-CM | POA: Diagnosis not present

## 2022-07-05 DIAGNOSIS — K319 Disease of stomach and duodenum, unspecified: Secondary | ICD-10-CM | POA: Diagnosis not present

## 2022-07-05 DIAGNOSIS — J9601 Acute respiratory failure with hypoxia: Secondary | ICD-10-CM | POA: Diagnosis not present

## 2022-07-05 DIAGNOSIS — R208 Other disturbances of skin sensation: Secondary | ICD-10-CM | POA: Diagnosis not present

## 2022-07-07 DIAGNOSIS — J9601 Acute respiratory failure with hypoxia: Secondary | ICD-10-CM | POA: Diagnosis not present

## 2022-07-07 DIAGNOSIS — R2681 Unsteadiness on feet: Secondary | ICD-10-CM | POA: Diagnosis not present

## 2022-07-07 DIAGNOSIS — R208 Other disturbances of skin sensation: Secondary | ICD-10-CM | POA: Diagnosis not present

## 2022-07-07 DIAGNOSIS — R531 Weakness: Secondary | ICD-10-CM | POA: Diagnosis not present

## 2022-07-11 DIAGNOSIS — J9601 Acute respiratory failure with hypoxia: Secondary | ICD-10-CM | POA: Diagnosis not present

## 2022-07-11 DIAGNOSIS — R208 Other disturbances of skin sensation: Secondary | ICD-10-CM | POA: Diagnosis not present

## 2022-07-11 DIAGNOSIS — R531 Weakness: Secondary | ICD-10-CM | POA: Diagnosis not present

## 2022-07-11 DIAGNOSIS — R2681 Unsteadiness on feet: Secondary | ICD-10-CM | POA: Diagnosis not present

## 2022-07-19 DIAGNOSIS — J9601 Acute respiratory failure with hypoxia: Secondary | ICD-10-CM | POA: Diagnosis not present

## 2022-07-19 DIAGNOSIS — R2681 Unsteadiness on feet: Secondary | ICD-10-CM | POA: Diagnosis not present

## 2022-07-19 DIAGNOSIS — R531 Weakness: Secondary | ICD-10-CM | POA: Diagnosis not present

## 2022-07-19 DIAGNOSIS — R208 Other disturbances of skin sensation: Secondary | ICD-10-CM | POA: Diagnosis not present

## 2022-07-21 DIAGNOSIS — R531 Weakness: Secondary | ICD-10-CM | POA: Diagnosis not present

## 2022-07-21 DIAGNOSIS — R2681 Unsteadiness on feet: Secondary | ICD-10-CM | POA: Diagnosis not present

## 2022-07-21 DIAGNOSIS — R208 Other disturbances of skin sensation: Secondary | ICD-10-CM | POA: Diagnosis not present

## 2022-07-21 DIAGNOSIS — J9601 Acute respiratory failure with hypoxia: Secondary | ICD-10-CM | POA: Diagnosis not present

## 2022-07-26 DIAGNOSIS — K219 Gastro-esophageal reflux disease without esophagitis: Secondary | ICD-10-CM | POA: Diagnosis not present

## 2022-07-26 DIAGNOSIS — I10 Essential (primary) hypertension: Secondary | ICD-10-CM | POA: Diagnosis not present

## 2022-07-26 DIAGNOSIS — N183 Chronic kidney disease, stage 3 unspecified: Secondary | ICD-10-CM | POA: Diagnosis not present

## 2022-07-26 DIAGNOSIS — E782 Mixed hyperlipidemia: Secondary | ICD-10-CM | POA: Diagnosis not present

## 2022-07-27 DIAGNOSIS — H16223 Keratoconjunctivitis sicca, not specified as Sjogren's, bilateral: Secondary | ICD-10-CM | POA: Diagnosis not present

## 2022-08-03 ENCOUNTER — Encounter (HOSPITAL_COMMUNITY): Payer: Self-pay

## 2022-08-03 ENCOUNTER — Inpatient Hospital Stay (HOSPITAL_COMMUNITY): Payer: Medicare Other

## 2022-08-03 ENCOUNTER — Other Ambulatory Visit: Payer: Self-pay | Admitting: Surgery

## 2022-08-03 ENCOUNTER — Inpatient Hospital Stay (HOSPITAL_COMMUNITY)
Admission: AD | Admit: 2022-08-03 | Discharge: 2022-08-15 | DRG: 327 | Disposition: A | Discharge status: Home / Self Care | Payer: Medicare Other | Source: Ambulatory Visit | Attending: Surgery | Admitting: Surgery

## 2022-08-03 DIAGNOSIS — M47816 Spondylosis without myelopathy or radiculopathy, lumbar region: Secondary | ICD-10-CM | POA: Diagnosis present

## 2022-08-03 DIAGNOSIS — M961 Postlaminectomy syndrome, not elsewhere classified: Secondary | ICD-10-CM | POA: Diagnosis present

## 2022-08-03 DIAGNOSIS — Z79899 Other long term (current) drug therapy: Secondary | ICD-10-CM | POA: Diagnosis not present

## 2022-08-03 DIAGNOSIS — N1831 Chronic kidney disease, stage 3a: Secondary | ICD-10-CM | POA: Diagnosis not present

## 2022-08-03 DIAGNOSIS — E7800 Pure hypercholesterolemia, unspecified: Secondary | ICD-10-CM | POA: Diagnosis present

## 2022-08-03 DIAGNOSIS — Z888 Allergy status to other drugs, medicaments and biological substances status: Secondary | ICD-10-CM | POA: Diagnosis not present

## 2022-08-03 DIAGNOSIS — R1310 Dysphagia, unspecified: Secondary | ICD-10-CM | POA: Diagnosis not present

## 2022-08-03 DIAGNOSIS — I129 Hypertensive chronic kidney disease with stage 1 through stage 4 chronic kidney disease, or unspecified chronic kidney disease: Secondary | ICD-10-CM | POA: Diagnosis present

## 2022-08-03 DIAGNOSIS — J9811 Atelectasis: Secondary | ICD-10-CM | POA: Diagnosis not present

## 2022-08-03 DIAGNOSIS — I495 Sick sinus syndrome: Secondary | ICD-10-CM | POA: Diagnosis present

## 2022-08-03 DIAGNOSIS — K440 Diaphragmatic hernia with obstruction, without gangrene: Secondary | ICD-10-CM | POA: Diagnosis not present

## 2022-08-03 DIAGNOSIS — K3189 Other diseases of stomach and duodenum: Principal | ICD-10-CM | POA: Diagnosis present

## 2022-08-03 DIAGNOSIS — K224 Dyskinesia of esophagus: Secondary | ICD-10-CM | POA: Diagnosis not present

## 2022-08-03 DIAGNOSIS — M797 Fibromyalgia: Secondary | ICD-10-CM | POA: Diagnosis not present

## 2022-08-03 DIAGNOSIS — N289 Disorder of kidney and ureter, unspecified: Secondary | ICD-10-CM | POA: Diagnosis not present

## 2022-08-03 DIAGNOSIS — N179 Acute kidney failure, unspecified: Secondary | ICD-10-CM | POA: Diagnosis not present

## 2022-08-03 DIAGNOSIS — Z91013 Allergy to seafood: Secondary | ICD-10-CM | POA: Diagnosis not present

## 2022-08-03 DIAGNOSIS — I10 Essential (primary) hypertension: Secondary | ICD-10-CM | POA: Diagnosis not present

## 2022-08-03 DIAGNOSIS — M6281 Muscle weakness (generalized): Secondary | ICD-10-CM | POA: Diagnosis present

## 2022-08-03 DIAGNOSIS — R296 Repeated falls: Secondary | ICD-10-CM | POA: Diagnosis not present

## 2022-08-03 DIAGNOSIS — Z96653 Presence of artificial knee joint, bilateral: Secondary | ICD-10-CM | POA: Diagnosis not present

## 2022-08-03 DIAGNOSIS — I25119 Atherosclerotic heart disease of native coronary artery with unspecified angina pectoris: Secondary | ICD-10-CM | POA: Diagnosis present

## 2022-08-03 DIAGNOSIS — Z825 Family history of asthma and other chronic lower respiratory diseases: Secondary | ICD-10-CM

## 2022-08-03 DIAGNOSIS — Z791 Long term (current) use of non-steroidal anti-inflammatories (NSAID): Secondary | ICD-10-CM | POA: Diagnosis not present

## 2022-08-03 DIAGNOSIS — K449 Diaphragmatic hernia without obstruction or gangrene: Secondary | ICD-10-CM | POA: Diagnosis not present

## 2022-08-03 DIAGNOSIS — R911 Solitary pulmonary nodule: Secondary | ICD-10-CM | POA: Diagnosis not present

## 2022-08-03 DIAGNOSIS — F32A Depression, unspecified: Secondary | ICD-10-CM | POA: Diagnosis present

## 2022-08-03 DIAGNOSIS — R0609 Other forms of dyspnea: Secondary | ICD-10-CM | POA: Diagnosis not present

## 2022-08-03 DIAGNOSIS — G894 Chronic pain syndrome: Secondary | ICD-10-CM | POA: Diagnosis present

## 2022-08-03 DIAGNOSIS — Z801 Family history of malignant neoplasm of trachea, bronchus and lung: Secondary | ICD-10-CM

## 2022-08-03 DIAGNOSIS — F419 Anxiety disorder, unspecified: Secondary | ICD-10-CM | POA: Diagnosis present

## 2022-08-03 DIAGNOSIS — R109 Unspecified abdominal pain: Secondary | ICD-10-CM | POA: Diagnosis not present

## 2022-08-03 DIAGNOSIS — Z8249 Family history of ischemic heart disease and other diseases of the circulatory system: Secondary | ICD-10-CM | POA: Diagnosis not present

## 2022-08-03 DIAGNOSIS — Z833 Family history of diabetes mellitus: Secondary | ICD-10-CM

## 2022-08-03 DIAGNOSIS — Z0389 Encounter for observation for other suspected diseases and conditions ruled out: Secondary | ICD-10-CM | POA: Diagnosis not present

## 2022-08-03 DIAGNOSIS — K219 Gastro-esophageal reflux disease without esophagitis: Secondary | ICD-10-CM | POA: Diagnosis present

## 2022-08-03 DIAGNOSIS — E860 Dehydration: Secondary | ICD-10-CM | POA: Diagnosis not present

## 2022-08-03 DIAGNOSIS — D509 Iron deficiency anemia, unspecified: Secondary | ICD-10-CM | POA: Diagnosis present

## 2022-08-03 DIAGNOSIS — E876 Hypokalemia: Secondary | ICD-10-CM | POA: Diagnosis present

## 2022-08-03 DIAGNOSIS — M5412 Radiculopathy, cervical region: Secondary | ICD-10-CM | POA: Diagnosis present

## 2022-08-03 DIAGNOSIS — M545 Low back pain, unspecified: Secondary | ICD-10-CM | POA: Diagnosis present

## 2022-08-03 LAB — CBC WITH DIFFERENTIAL/PLATELET
Abs Immature Granulocytes: 0.01 10*3/uL (ref 0.00–0.07)
Basophils Absolute: 0 10*3/uL (ref 0.0–0.1)
Basophils Relative: 1 %
Eosinophils Absolute: 0.1 10*3/uL (ref 0.0–0.5)
Eosinophils Relative: 4 %
HCT: 37.2 % (ref 36.0–46.0)
Hemoglobin: 12.2 g/dL (ref 12.0–15.0)
Immature Granulocytes: 0 %
Lymphocytes Relative: 33 %
Lymphs Abs: 0.9 10*3/uL (ref 0.7–4.0)
MCH: 26.2 pg (ref 26.0–34.0)
MCHC: 32.8 g/dL (ref 30.0–36.0)
MCV: 80 fL (ref 80.0–100.0)
Monocytes Absolute: 0.4 10*3/uL (ref 0.1–1.0)
Monocytes Relative: 16 %
Neutro Abs: 1.3 10*3/uL — ABNORMAL LOW (ref 1.7–7.7)
Neutrophils Relative %: 46 %
Platelets: 143 10*3/uL — ABNORMAL LOW (ref 150–400)
RBC: 4.65 MIL/uL (ref 3.87–5.11)
RDW: 14.4 % (ref 11.5–15.5)
WBC: 2.8 10*3/uL — ABNORMAL LOW (ref 4.0–10.5)
nRBC: 0 % (ref 0.0–0.2)

## 2022-08-03 LAB — COMPREHENSIVE METABOLIC PANEL
ALT: 20 U/L (ref 0–44)
AST: 27 U/L (ref 15–41)
Albumin: 3.5 g/dL (ref 3.5–5.0)
Alkaline Phosphatase: 146 U/L — ABNORMAL HIGH (ref 38–126)
Anion gap: 8 (ref 5–15)
BUN: 11 mg/dL (ref 8–23)
CO2: 25 mmol/L (ref 22–32)
Calcium: 8.9 mg/dL (ref 8.9–10.3)
Chloride: 105 mmol/L (ref 98–111)
Creatinine, Ser: 0.92 mg/dL (ref 0.44–1.00)
GFR, Estimated: >60 mL/min (ref >60)
Glucose, Bld: 94 mg/dL (ref 70–99)
Potassium: 3.3 mmol/L — ABNORMAL LOW (ref 3.5–5.1)
Sodium: 138 mmol/L (ref 135–145)
Total Bilirubin: 0.3 mg/dL (ref 0.3–1.2)
Total Protein: 6.9 g/dL (ref 6.5–8.1)

## 2022-08-03 MED ORDER — ONDANSETRON 4 MG PO TBDP
4.0000 mg | ORAL_TABLET | Freq: Four times a day (QID) | ORAL | Status: DC | PRN
  Administered 2022-08-03 – 2022-08-15 (×4): 4 mg via ORAL
  Filled 2022-08-03 (×4): qty 1

## 2022-08-03 MED ORDER — ACETAMINOPHEN 325 MG PO TABS
650.0000 mg | ORAL_TABLET | Freq: Four times a day (QID) | ORAL | Status: DC
  Administered 2022-08-03 – 2022-08-05 (×6): 650 mg via ORAL
  Filled 2022-08-03 (×6): qty 2

## 2022-08-03 MED ORDER — HYDROMORPHONE HCL 1 MG/ML IJ SOLN
0.5000 mg | INTRAMUSCULAR | Status: DC | PRN
  Administered 2022-08-03 – 2022-08-10 (×11): 0.5 mg via INTRAVENOUS
  Filled 2022-08-03 (×11): qty 0.5

## 2022-08-03 MED ORDER — METOPROLOL TARTRATE 5 MG/5ML IV SOLN: 5.0000 mg | Freq: Four times a day (QID) | INTRAVENOUS | Status: DC | PRN

## 2022-08-03 MED ORDER — KCL IN DEXTROSE-NACL 20-5-0.45 MEQ/L-%-% IV SOLN
INTRAVENOUS | Status: DC
  Filled 2022-08-03 (×4): qty 1000

## 2022-08-03 MED ORDER — ONDANSETRON HCL 4 MG/2ML IJ SOLN
4.0000 mg | Freq: Four times a day (QID) | INTRAMUSCULAR | Status: DC | PRN
  Administered 2022-08-04 – 2022-08-14 (×16): 4 mg via INTRAVENOUS
  Filled 2022-08-03 (×15): qty 2

## 2022-08-03 MED ORDER — PROCHLORPERAZINE EDISYLATE 10 MG/2ML IJ SOLN
10.0000 mg | Freq: Four times a day (QID) | INTRAMUSCULAR | Status: DC | PRN
  Administered 2022-08-03 – 2022-08-04 (×4): 10 mg via INTRAVENOUS
  Filled 2022-08-03 (×4): qty 2

## 2022-08-03 MED ORDER — PANTOPRAZOLE SODIUM 40 MG IV SOLR
40.0000 mg | Freq: Every day | INTRAVENOUS | Status: DC
  Administered 2022-08-03: 40 mg via INTRAVENOUS
  Filled 2022-08-03: qty 10

## 2022-08-03 MED ORDER — IOHEXOL 350 MG/ML SOLN
75.0000 mL | Freq: Once | INTRAVENOUS | Status: AC | PRN
  Administered 2022-08-03: 75 mL via INTRAVENOUS

## 2022-08-03 MED ORDER — HEPARIN SODIUM (PORCINE) 5000 UNIT/ML IJ SOLN
5000.0000 [IU] | Freq: Three times a day (TID) | INTRAMUSCULAR | Status: DC
  Administered 2022-08-03 – 2022-08-15 (×33): 5000 [IU] via SUBCUTANEOUS
  Filled 2022-08-03 (×32): qty 1

## 2022-08-03 NOTE — H&P (Signed)
REFERRING PHYSICIAN:  [name], MD  PROVIDER:  Joya San, MD  MRN: [id number] DOB: July 16, 1955 DATE OF ENCOUNTER: 08/03/2022  Subjective   Chief Complaint: New Consultation (Hiatal Hernia)     History of Present Illness: [name] is a 67 y.o. female who is seen today as an office consultation at the request of Dr. Therisa Doyne for evaluation of New Consultation (Hiatal Hernia) .    She had an endoscopy by Dr. Therisa Doyne on Feb 23 that showed a tortuous esophagus and a 10 cm hiatal hernia.  She was referred for surgical repair.  For the past 3 days she has had left upper quadrant pain and inability to keep anything down.  She has been vomiting green material.  She has had a prior cholecystectomy and appendectomy in 1977.    She is from [place] but is followed by Dr. Benita Stabile.     Review of Systems: See HPI as well for other ROS.  Review of Systems  Constitutional:  Positive for chills.  Gastrointestinal:  Positive for abdominal pain, diarrhea, heartburn, nausea and vomiting.  Neurological:  Positive for dizziness, weakness and headaches.  Psychiatric/Behavioral:  Positive for depression. The patient is nervous/anxious.   All other systems reviewed and are negative.     Medical History: Past Medical History:  Diagnosis Date   Anemia    Anxiety    Arthritis    GERD (gastroesophageal reflux disease)    Hypertension     There is no problem list on file for this patient.   Past Surgical History:  Procedure Laterality Date   CHOLECYSTECTOMY  1977   [place]   Lumbar laminectomy/decompression microdiscectomy  05/2006   EGD  2008   Open reduction internal fixation (orif) distal radial fracture (Left)  06/18/2018   Left heart cath and coronary angiography  03/16/2020   2005, 2018   ANKLE SURGERY  Bilateral    FOREARM SURGERY      HIP SURGERY (BILATERAL)     JOINT REPLACEMENT     KNEE SURGERY  Bilateral     Laceration repair (Right)     LAPAROSCOPIC TUBAL LIGATION     Right oophorectomy     SHOULDER SURGERY  Bilateral    TONSILLECTOMY       Allergies  Allergen Reactions   Aluminum Aspirin Nausea   Aspirin Nausea    Stomach issues  Other reaction(s): stomach upset   Oxybutynin Rash    Current Outpatient Medications on File Prior to Visit  Medication Sig Dispense Refill   albuterol MDI, PROVENTIL, VENTOLIN, PROAIR, HFA 90 mcg/actuation inhaler Inhale into the lungs     amLODIPine (NORVASC) 10 MG tablet Take 10 mg by mouth once daily     famotidine (PEPCID) 20 MG tablet Take 1 tablet by mouth 2 (two) times daily     FLUoxetine (PROZAC) 20 MG capsule Take by mouth     FUROsemide (LASIX) 20 MG tablet TAKE 1/2 TO 1 (ONE-HALF TO ONE) TABLET BY MOUTH ONCE DAILY AS NEEDED FOR SWELLING     gabapentin (NEURONTIN) 300 MG capsule Take 1 capsule by mouth 3 (three) times daily     hydroCHLOROthiazide (HYDRODIURIL) 25 MG tablet Take 25 mg by mouth once daily     nitroGLYcerin (NITROSTAT) 0.4 MG SL tablet Place under the tongue at bedtime as needed     promethazine (PHENERGAN) 25 MG tablet TAKE 1/2 TO 1 (ONE-HALF TO ONE) TABLET BY MOUTH THREE TIMES  DAILY AS NEEDED     rosuvastatin (CRESTOR) 20 MG tablet TAKE 1 TABLET BY MOUTH ONCE DAILY AT 6 PM     No current facility-administered medications on file prior to visit.    History reviewed. No pertinent family history.   Social History   Tobacco Use  Smoking Status Never  Smokeless Tobacco Never     Social History   Socioeconomic History   Marital status: Single  Tobacco Use   Smoking status: Never   Smokeless tobacco: Never  Substance and Sexual Activity   Alcohol use: Not Currently   Drug use: Never    Objective:    Vitals:   08/03/22 1347 08/03/22 1348  BP: (!) 141/74   Pulse: 59   Temp: 36.4 C (97.5 F)   SpO2: 92%   Weight: 89.2 kg (196 lb 9.6 oz)   Height: 160 cm (5\' 3" )   PainSc:    8    Body mass index is 34.83  kg/m.  Physical Exam General:  Older appearing WF in acute upper abdominal pain HEENT  :unremarkable Chest:  clear Heart:  SR rate ~ 80 Breast:  not examined Abdomen:  tender in the left upper quadrant GU  not examined Rectal  not performed Extremities  FROM Neuro  alert and oriented x 3      Labs, Imaging and Diagnostic Testing: None to review except Dr. 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  Will direct admit to [facility] for further workup that may involve surgery.    No follow-ups on file.  Kinsley Nicklaus Donia Pounds, MD

## 2022-08-03 NOTE — Progress Notes (Signed)
4 eyes complete with Shanelle Rn all skin intact with scars from bilat total knee and cholesystectomy and pappendectomy site on abdomen and bilateral shoulders

## 2022-08-03 NOTE — Progress Notes (Deleted)
REFERRING PHYSICIAN:  [name], MD   PROVIDER:  Joya San, MD   MRN: [id number] DOB: 1955/05/11 DATE OF ENCOUNTER: 08/03/2022   Subjective    Chief Complaint: New Consultation (Hiatal Hernia)       History of Present Illness: [name] is a 67 y.o. female who is seen today as an office consultation at the request of Dr. Therisa Doyne for evaluation of New Consultation (Hiatal Hernia) .     She had an endoscopy by Dr. Therisa Doyne on Feb 23 that showed a tortuous esophagus and a 10 cm hiatal hernia.  She was referred for surgical repair.  For the past 3 days she has had left upper quadrant pain and inability to keep anything down.  She has been vomiting green material.  She has had a prior cholecystectomy and appendectomy in 1977.     She is from [place] but is followed by Dr. Benita Stabile.       Review of Systems: See HPI as well for other ROS.   Review of Systems  Constitutional:  Positive for chills.  Gastrointestinal:  Positive for abdominal pain, diarrhea, heartburn, nausea and vomiting.  Neurological:  Positive for dizziness, weakness and headaches.  Psychiatric/Behavioral:  Positive for depression. The patient is nervous/anxious.   All other systems reviewed and are negative.       Medical History: Past Medical History      Past Medical History:  Diagnosis Date   Anemia     Anxiety     Arthritis     GERD (gastroesophageal reflux disease)     Hypertension          There is no problem list on file for this patient.     Past Surgical History       Past Surgical History:  Procedure Laterality Date   CHOLECYSTECTOMY   1977   APPENDECTOMY   1977   ABDOMINAL HYSTERECTOMY   1993   Lumbar laminectomy/decompression microdiscectomy   05/2006   EGD   2008   Open reduction internal fixation (orif) distal radial fracture (Left)   06/18/2018   Left heart cath and coronary angiography   03/16/2020    2005, 2018   ANKLE SURGERY  Bilateral     FOREARM  SURGERY        HIP SURGERY (BILATERAL)       JOINT REPLACEMENT       KNEE SURGERY  Bilateral     Laceration repair (Right)       LAPAROSCOPIC TUBAL LIGATION       Right oophorectomy       SHOULDER SURGERY  Bilateral     TONSILLECTOMY            Allergies       Allergies  Allergen Reactions   Aluminum Aspirin Nausea   Aspirin Nausea      Stomach issues   Other reaction(s): stomach upset   Oxybutynin Rash              Current Outpatient Medications on File Prior to Visit  Medication Sig Dispense Refill   albuterol MDI, PROVENTIL, VENTOLIN, PROAIR, HFA 90 mcg/actuation inhaler Inhale into the lungs       amLODIPine (NORVASC) 10 MG tablet Take 10 mg by mouth once daily       famotidine (PEPCID) 20 MG tablet Take 1 tablet by mouth 2 (two) times daily       FLUoxetine (PROZAC) 20 MG capsule Take by mouth  FUROsemide (LASIX) 20 MG tablet TAKE 1/2 TO 1 (ONE-HALF TO ONE) TABLET BY MOUTH ONCE DAILY AS NEEDED FOR SWELLING       gabapentin (NEURONTIN) 300 MG capsule Take 1 capsule by mouth 3 (three) times daily       hydroCHLOROthiazide (HYDRODIURIL) 25 MG tablet Take 25 mg by mouth once daily       nitroGLYcerin (NITROSTAT) 0.4 MG SL tablet Place under the tongue at bedtime as needed       promethazine (PHENERGAN) 25 MG tablet TAKE 1/2 TO 1 (ONE-HALF TO ONE) TABLET BY MOUTH THREE TIMES DAILY AS NEEDED       rosuvastatin (CRESTOR) 20 MG tablet TAKE 1 TABLET BY MOUTH ONCE DAILY AT 6 PM        No current facility-administered medications on file prior to visit.      Family History  History reviewed. No pertinent family history.      Social History       Tobacco Use  Smoking Status Never  Smokeless Tobacco Never      Social History  Social History        Socioeconomic History   Marital status: Single  Tobacco Use   Smoking status: Never   Smokeless tobacco: Never  Substance and Sexual Activity   Alcohol use: Not Currently   Drug use: Never        Objective:           Vitals:    08/03/22 1347 08/03/22 1348  BP: (!) 141/74    Pulse: 59    Temp: 36.4 C (97.5 F)    SpO2: 92%    Weight: 89.2 kg (196 lb 9.6 oz)    Height: 160 cm (5\' 3" )    PainSc:     8    Body mass index is 34.83 kg/m.   Physical Exam General:  Older appearing WF in acute upper abdominal pain HEENT  :unremarkable Chest:  clear Heart:  SR rate ~ 80 Breast:  not examined Abdomen:  tender in the left upper quadrant GU  not examined Rectal  not performed Extremities  FROM Neuro  alert and oriented x 3          Labs, Imaging and Diagnostic Testing: None to review except Dr. 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  Will direct admit to [facility] for further workup that may involve surgery.     No follow-ups on file.   MATTHEW Donia Pounds, MD

## 2022-08-04 ENCOUNTER — Encounter (HOSPITAL_COMMUNITY): Payer: Self-pay | Admitting: Surgery

## 2022-08-04 ENCOUNTER — Encounter: Payer: Self-pay | Admitting: Hematology

## 2022-08-04 ENCOUNTER — Other Ambulatory Visit: Payer: Self-pay

## 2022-08-04 ENCOUNTER — Inpatient Hospital Stay (HOSPITAL_COMMUNITY): Payer: Medicare Other

## 2022-08-04 DIAGNOSIS — K3189 Other diseases of stomach and duodenum: Secondary | ICD-10-CM | POA: Diagnosis not present

## 2022-08-04 LAB — CBC
HCT: 36.9 % (ref 36.0–46.0)
Hemoglobin: 11.8 g/dL — ABNORMAL LOW (ref 12.0–15.0)
MCH: 26.1 pg (ref 26.0–34.0)
MCHC: 32 g/dL (ref 30.0–36.0)
MCV: 81.6 fL (ref 80.0–100.0)
Platelets: 131 10*3/uL — ABNORMAL LOW (ref 150–400)
RBC: 4.52 MIL/uL (ref 3.87–5.11)
RDW: 14.4 % (ref 11.5–15.5)
WBC: 2.6 10*3/uL — ABNORMAL LOW (ref 4.0–10.5)
nRBC: 0 % (ref 0.0–0.2)

## 2022-08-04 LAB — BASIC METABOLIC PANEL
Anion gap: 7 (ref 5–15)
BUN: 11 mg/dL (ref 8–23)
CO2: 27 mmol/L (ref 22–32)
Calcium: 8.8 mg/dL — ABNORMAL LOW (ref 8.9–10.3)
Chloride: 105 mmol/L (ref 98–111)
Creatinine, Ser: 0.84 mg/dL (ref 0.44–1.00)
GFR, Estimated: >60 mL/min (ref >60)
Glucose, Bld: 115 mg/dL — ABNORMAL HIGH (ref 70–99)
Potassium: 3.2 mmol/L — ABNORMAL LOW (ref 3.5–5.1)
Sodium: 139 mmol/L (ref 135–145)

## 2022-08-04 LAB — TROPONIN I (HIGH SENSITIVITY)
Troponin I (High Sensitivity): 4 ng/L (ref <18)
Troponin I (High Sensitivity): 3 ng/L (ref <18)

## 2022-08-04 LAB — MAGNESIUM: Magnesium: 2.1 mg/dL (ref 1.7–2.4)

## 2022-08-04 MED ORDER — ALBUTEROL SULFATE (2.5 MG/3ML) 0.083% IN NEBU: 3.0000 mL | INHALATION_SOLUTION | RESPIRATORY_TRACT | Status: DC | PRN

## 2022-08-04 MED ORDER — ALPRAZOLAM 0.5 MG PO TABS
1.0000 mg | ORAL_TABLET | Freq: Two times a day (BID) | ORAL | Status: DC
  Administered 2022-08-04 – 2022-08-15 (×21): 1 mg via ORAL
  Filled 2022-08-04 (×23): qty 2

## 2022-08-04 MED ORDER — DULOXETINE HCL 20 MG PO CPEP
20.0000 mg | ORAL_CAPSULE | Freq: Two times a day (BID) | ORAL | Status: DC
  Administered 2022-08-04 – 2022-08-09 (×12): 20 mg via ORAL
  Filled 2022-08-04 (×13): qty 1

## 2022-08-04 MED ORDER — PANTOPRAZOLE SODIUM 40 MG IV SOLR
40.0000 mg | Freq: Two times a day (BID) | INTRAVENOUS | Status: DC
  Administered 2022-08-04 – 2022-08-09 (×12): 40 mg via INTRAVENOUS
  Filled 2022-08-04 (×12): qty 10

## 2022-08-04 MED ORDER — POTASSIUM CHLORIDE 10 MEQ/100ML IV SOLN
10.0000 meq | INTRAVENOUS | Status: AC
  Administered 2022-08-04 (×3): 10 meq via INTRAVENOUS
  Filled 2022-08-04 (×3): qty 100

## 2022-08-04 MED ORDER — GABAPENTIN 300 MG PO CAPS
300.0000 mg | ORAL_CAPSULE | Freq: Three times a day (TID) | ORAL | Status: DC
  Administered 2022-08-04 – 2022-08-09 (×17): 300 mg via ORAL
  Filled 2022-08-04 (×17): qty 1

## 2022-08-04 MED ORDER — AMLODIPINE BESYLATE 5 MG PO TABS
5.0000 mg | ORAL_TABLET | Freq: Every day | ORAL | Status: DC
  Administered 2022-08-04 – 2022-08-15 (×11): 5 mg via ORAL
  Filled 2022-08-04 (×12): qty 1

## 2022-08-04 MED ORDER — TIZANIDINE HCL 2 MG PO TABS
4.0000 mg | ORAL_TABLET | Freq: Two times a day (BID) | ORAL | Status: DC | PRN
  Administered 2022-08-08 – 2022-08-15 (×8): 4 mg via ORAL
  Filled 2022-08-04: qty 1
  Filled 2022-08-04 (×3): qty 2
  Filled 2022-08-04: qty 1
  Filled 2022-08-04 (×3): qty 2

## 2022-08-04 MED ORDER — HYDROCODONE-ACETAMINOPHEN 10-325 MG PO TABS
1.0000 | ORAL_TABLET | Freq: Four times a day (QID) | ORAL | Status: DC | PRN
  Administered 2022-08-05 – 2022-08-15 (×18): 1 via ORAL
  Filled 2022-08-04 (×19): qty 1

## 2022-08-04 MED ORDER — POTASSIUM CHLORIDE 10 MEQ/100ML IV SOLN
10.0000 meq | Freq: Once | INTRAVENOUS | Status: AC
  Administered 2022-08-04: 10 meq via INTRAVENOUS
  Filled 2022-08-04 (×2): qty 100

## 2022-08-04 MED ORDER — BACLOFEN 10 MG PO TABS
10.0000 mg | ORAL_TABLET | Freq: Three times a day (TID) | ORAL | Status: DC | PRN
  Administered 2022-08-04 – 2022-08-10 (×11): 10 mg via ORAL
  Filled 2022-08-04 (×11): qty 1

## 2022-08-04 MED ORDER — LISINOPRIL 10 MG PO TABS
10.0000 mg | ORAL_TABLET | Freq: Every day | ORAL | Status: DC
  Administered 2022-08-04 – 2022-08-10 (×7): 10 mg via ORAL
  Filled 2022-08-04 (×4): qty 1
  Filled 2022-08-04: qty 2
  Filled 2022-08-04 (×2): qty 1

## 2022-08-04 NOTE — Consult Note (Addendum)
Cardiology Consultation   Patient ID: [name] MRN: [id number]; DOB: 05-Sep-1955  Admit date: 08/03/2022 Date of Consult: 08/04/2022  PCP:  [name], MD   [place] Providers Cardiologist:  new to Dr. Percival Spanish, previously with Dr. Tamala Julian  Patient Profile:   [name] is a 67 y.o. female with a hx of nonobstructive CAD, PUD, prolonged QTC in the setting of hypokalemia, fibromyalgia, depression, chroinc pain, CKD III, HTN, HLD, anemia, and prior back surgereis who is being seen 08/04/2022 for the evaluation of exertional chest pain and preoperative risk evaluation at the request of Dr. Hassell Done.  History of Present Illness:   [name] was admitted in 2018 with dizziness and bradycardia with HR int he 40-50s. She has a history of chest pain and underwent nuclear stress test that showed reversible defect. She then underwent coronary CTA 11/18/2016 that revealed an elevated coronary calcium score of 848 placing her at the [place] percentile with possibly hemodynamically significant stenosis in the proximal and mid LAD.  This subsequently led to left heart catheterization on the same day that showed mild CAD throughout the LAD. Nonobstructive disease throughout the LAD.  No intervention was made. Echocardiogram revealed LVEF 60-65%, mild MR, moderately dilated left atrium.  Medical therapy was recommended. QTC prolonged to 526 ms in the setting of hypokalemia. Phenergan and seroquel were discontinued. She was instructed to follow up PRN with cardiology. She was referred back in 2021 by PCP for ongoing chest pain and underwent repeat heart catheterization 03/16/20 with mild to moderate nonobstructive disease similar to prior cardiac catheterization in 2018 and grossly normal LV function. Continued medical therapy and aggressive risk factor modification was recommended. No ASA given PUD history. She was seen in OP clinci after cath and was doing well. She was seen back in  clinic 12/2021 with increased chest pain with and without activity and DOE. Echocardiogram and repeat heart cath were planned, but not completed. Amlodipine was started. Cath canceled for anemia - pre cath labs showed Hb 8.8.   She follows with heme/onc for ongoing microcytic anemia due to iron deficiency suspected secondary to chronic GI losses and decreased iron absorption due to chronic PPI use, last seen 02/2022 and referred for GI workup.   She was seen by GI in the office 08/03/22 to evaluate hiatal hernia. She had an endoscopy 07/01/22 with tortuous esophagus and 10 cm hiatal hernia. She was referred to surgical repair. She reported anorexia x 3 days with LUQ pain and vomiting green material. 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 Given that she reported chest pain with prior pending ischemic evaluation, cardiology was consulted for chest pain and preoperative risk evaluation.   During exam, history is difficult to follow. Apparently she lost her balance, fell, and was hospitalized at the end of December at [facility].  She was discharged to rehab and now home with ongoing PT. She has lost mobility and reports nerve damage that now limits her activities. Prior to this fall, she reports activity equivalent to 4.0 METS - house cleaning, walking 20 min, grocery shopping. With prolonged walking, she describes dyspnea on exertion. She states it is difficult to differentiate chest pain from her stomach pain, which she has been dealing with for some time. She often has chest discomfort which accompanies her CP after eating. She denies palpitations, orthopnea, PND, leg swelling, and dyspnea at rest. Now with prolonged walking, she gets DOE and mild  chest pain in the left of her chest that seems to radiate from her LUQ/epigastric area. She is not currently having chest pain.   Past Medical History:  Diagnosis Date   Anemia    Anxiety     Arthritis    "all over" (11/16/2016)   CAD (coronary artery disease)    Chronic lower back pain    Chronic pain syndrome    Chronic pain syndrome    "all over" (11/16/2016)   CKD (chronic kidney disease), stage III (HCC)    Daily headache    Depression    Fibromyalgia    GERD (gastroesophageal reflux disease)    High cholesterol    History of blood transfusion    "related to hand laceration"   History of hiatal hernia    History of stomach ulcers    "bled when I was younger" (11/16/2016)   Hypercholesterolemia    Hypertension    hx; "they took me off my RX" (11/16/2016)   Migraine 1970s   Phlebitis of leg, right    "years ago" (11/16/2016)   Radiculopathy     Past Surgical History:  Procedure Laterality Date   ABDOMINAL HYSTERECTOMY  1993   w/left ovary   ANKLE FRACTURE SURGERY Bilateral    [name]  2005   "negative"   CHOLECYSTECTOMY OPEN  1977   ESOPHAGOGASTRODUODENOSCOPY  2008   dx'd GERD   FOREARM FRACTURE SURGERY Right    HIP SURGERY Bilateral    "took out bone spurs"   JOINT REPLACEMENT     KNEE ARTHROSCOPY Bilateral    "total of 18 times"   LACERATION REPAIR Right    hand; "went thru glass door"   LEFT HEART CATH AND CORONARY ANGIOGRAPHY N/A 11/18/2016   Procedure: Left Heart Cath and Coronary Angiography;  Surgeon: Nelva Bush, MD;  Location: [facility];  Service: Cardiovascular;  Laterality: N/A;   LEFT HEART CATH AND CORONARY ANGIOGRAPHY N/A 03/16/2020   Procedure: LEFT HEART CATH AND CORONARY ANGIOGRAPHY;  Surgeon: Nelva Bush, MD;  Location: [facility];  Service: Cardiovascular;  Laterality: N/A;   LUMBAR FUSION     "put rods in"   LUMBAR LAMINECTOMY/DECOMPRESSION MICRODISCECTOMY  05/2006   OPEN REDUCTION INTERNAL FIXATION (ORIF) DISTAL RADIAL FRACTURE Left 06/18/2018   Procedure: OPEN REDUCTION INTERNAL FIXATION (ORIF) DISTAL RADIAL FRACTURE repair and/or reconstruction;   Surgeon: Iran Planas, MD;  Location: [facility];  Service: Orthopedics;  Laterality: Left;   REVISION TOTAL KNEE ARTHROPLASTY Bilateral    RIGHT OOPHORECTOMY Right    SHOULDER OPEN ROTATOR CUFF REPAIR Bilateral    left 04/2006   TONSILLECTOMY     TOTAL KNEE ARTHROPLASTY Bilateral    left 1998; right 2004   TUBAL LIGATION       Home Medications:  Prior to Admission medications   Medication Sig Start Date End Date Taking? Authorizing Provider  Acetaminophen-Caffeine (EXCEDRIN ASPIRIN FREE PO) Take 2 tablets by mouth 3 (three) times daily as needed (pain).     [provider]  albuterol (VENTOLIN HFA) 108 (90 Base) MCG/ACT inhaler Inhale 2 puffs into the lungs every 4 (four) hours as needed for wheezing or shortness of breath.     [provider]  ALPRAZolam Duanne Moron) 1 MG tablet Take 1 tablet by mouth 3 (three) times daily as needed.    [provider]  amLODipine (NORVASC) 5 MG tablet Take 1 tablet (5 mg total) by  mouth daily. 01/03/22   Marylu Lund., NP  baclofen (LIORESAL) 10 MG tablet Take 10 mg by mouth 3 (three) times daily as needed. 12/01/21   [provider]  DULoxetine (CYMBALTA) 20 MG capsule Take 20 mg by mouth 2 (two) times daily. 11/30/21   [provider]  famotidine (PEPCID) 20 MG tablet Take 20 mg by mouth 2 (two) times daily.     [provider]  furosemide (LASIX) 20 MG tablet Take 10-20 mg by mouth daily as needed for fluid or edema.     [provider]  gabapentin (NEURONTIN) 300 MG capsule Take 300 mg by mouth 3 (three) times daily. 02/16/20   [provider]  HYDROcodone-acetaminophen (NORCO) 10-325 MG tablet Take 1 tablet by mouth 4 (four) times daily as needed. 11/30/21   [provider]  lisinopril (ZESTRIL) 10 MG tablet Take 10 mg by mouth daily. 11/16/21   [provider]  meloxicam (MOBIC) 15 MG tablet Take 15 mg by mouth daily.     [provider]  nitroGLYCERIN  (NITROSTAT) 0.4 MG SL tablet Place 1 tablet (0.4 mg total) under the tongue every 5 (five) minutes x 3 doses as needed for chest pain. 03/10/20   Tommie Raymond, NP  omeprazole (PRILOSEC) 20 MG capsule Take 20 mg by mouth 2 (two) times daily. 03/08/17   [provider]  potassium chloride (MICRO-K) 10 MEQ CR capsule Take 20 mEq by mouth 2 (two) times daily. 12/01/21   [provider]  promethazine (PHENERGAN) 25 MG tablet Take 12.5 mg by mouth every 8 (eight) hours as needed for nausea or vomiting.  05/17/17   [provider]  rosuvastatin (CRESTOR) 20 MG tablet TAKE 1 TABLET BY MOUTH ONCE DAILY . APPOINTMENT REQUIRED FOR FUTURE REFILLS 09/19/18   Belva Crome, MD  tiZANidine (ZANAFLEX) 4 MG tablet Take 4 mg by mouth 2 (two) times daily as needed for muscle spasms.     [provider]    Inpatient Medications: Scheduled Meds:  acetaminophen  650 mg Oral Q6H   ALPRAZolam  1 mg Oral BID   amLODipine  5 mg Oral Daily   DULoxetine  20 mg Oral BID   gabapentin  300 mg Oral TID   heparin  5,000 Units Subcutaneous Q8H   lisinopril  10 mg Oral Daily   pantoprazole (PROTONIX) IV  40 mg Intravenous Q12H   Continuous Infusions:  dextrose 5 % and 0.45 % NaCl with KCl 20 mEq/L 100 mL/hr at 08/03/22 2323   potassium chloride 10 mEq (08/04/22 1112)   PRN Meds: albuterol, baclofen, HYDROcodone-acetaminophen, HYDROmorphone (DILAUDID) injection, metoprolol tartrate, ondansetron **OR** ondansetron (ZOFRAN) IV, prochlorperazine, tiZANidine  Allergies:    Allergies  Allergen Reactions   Aspirin Nausea Only    Other reaction(s): stomach upset   Oxybutynin Rash   Shellfish Allergy Swelling and Rash    Social History:   Social History   Socioeconomic History   Marital status: Single    Spouse name: Not on file   Number of children: 0   Years of education: college   Highest education level: Not on file  Occupational History    Comment: retired  Tobacco Use    Smoking status: Never   Smokeless tobacco: Never  Vaping Use   Vaping Use: Never used  Substance and Sexual Activity   Alcohol use: Yes    Comment: occas   Drug use: No   Sexual activity: Yes  Other Topics  Concern   Not on file  Social History Narrative   Lives with Michaele Offer.     Education: college.     Children none.     Retired and Disabled.     Caffeine Drinks tea.   Social Determinants of Health   Financial Resource Strain: Not on file  Food Insecurity: No Food Insecurity (08/03/2022)   Hunger Vital Sign    Worried About Running Out of Food in the Last Year: Never true    Ran Out of Food in the Last Year: Never true  Transportation Needs: No Transportation Needs (08/03/2022)   PRAPARE - Hydrologist (Medical): No    Lack of Transportation (Non-Medical): No  Physical Activity: Not on file  Stress: Not on file  Social Connections: Not on file  Intimate Partner Violence: Not At Risk (08/04/2022)   Humiliation, Afraid, Rape, and Kick questionnaire    Fear of Current or Ex-Partner: No    Emotionally Abused: No    Physically Abused: No    Sexually Abused: No    Family History:    Family History  Problem Relation Age of Onset   COPD Mother    Emphysema Mother    CAD Father 49   Lung cancer Father    Heart disease Father    Other Sister        TRAP syndrome   Diabetes Sister        boarderline   Iron deficiency Sister      ROS:  Please see the history of present illness.   All other ROS reviewed and negative.     Physical Exam/Data:   Vitals:   08/03/22 1646 08/04/22 0032 08/04/22 0408 08/04/22 0737  BP:  (!) 144/69 (!) 147/66 123/74  Pulse:  65 62 67  Resp:  16 18 16   Temp:  97.9 F (36.6 C) 97.8 F (36.6 C) 97.6 F (36.4 C)  TempSrc:  Oral Oral Oral  SpO2:  94% 92% 93%  Weight: 88.5 kg     Height: 5\' 3"  (1.6 m)      No intake or output data in the 24 hours ending 08/04/22 1211    08/03/2022    4:46 PM 02/24/2022     1:42 PM 02/07/2022   12:35 PM  Last 3 Weights  Weight (lbs) 195 lb 202 lb 205 lb 12.8 oz  Weight (kg) 88.451 kg 91.627 kg 93.35 kg     Body mass index is 34.54 kg/m.  General:  Well nourished, well developed, in no acute distress HEENT: normal Neck: no JVD Vascular: No carotid bruits; Distal pulses 2+ bilaterally Cardiac:  normal S1, S2; RRR; no murmur  Lungs:  clear to auscultation bilaterally, no wheezing, rhonchi or rales  Abd: soft, tender Ext: no edema Musculoskeletal:  No deformities, BUE and BLE strength normal and equal Skin: warm and dry  Neuro:  CNs 2-12 intact, no focal abnormalities noted Psych:  Normal affect   EKG:  The EKG was personally reviewed and demonstrates:  sinus rhythm with HR 62, QTC 458 ms Telemetry:  Telemetry was personally reviewed and demonstrates:  N/A  Relevant CV Studies:  Left  heart cath 03/16/20: Conclusions: Mild to moderate, non-obstructive coronary artery disease, similar to prior catheterization in 11/2016. Grossly normal left ventricular systolic function with mildly elevated filling pressure. Tortuous right subclavian artery; consider use of long sheath or alternative access for future catheterizations.   Recommendations: Continue risk factor modification and medical therapy  to prevent progression of disease.  Laboratory Data:  High Sensitivity Troponin:  No results for input(s): "TROPONINIHS" in the last 720 hours.   Chemistry Recent Labs  Lab 08/03/22 1735 08/04/22 0230  NA 138 139  K 3.3* 3.2*  CL 105 105  CO2 25 27  GLUCOSE 94 115*  BUN 11 11  CREATININE 0.92 0.84  CALCIUM 8.9 8.8*  MG  --  2.1  GFRNONAA >60 >60  ANIONGAP 8 7    Recent Labs  Lab 08/03/22 1735  PROT 6.9  ALBUMIN 3.5  AST 27  ALT 20  ALKPHOS 146*  BILITOT 0.3   Lipids No results for input(s): "CHOL", "TRIG", "HDL", "LABVLDL", "LDLCALC", "CHOLHDL" in the last 168 hours.  Hematology Recent Labs  Lab 08/03/22 1735 08/04/22 0230  WBC 2.8*  2.6*  RBC 4.65 4.52  HGB 12.2 11.8*  HCT 37.2 36.9  MCV 80.0 81.6  MCH 26.2 26.1  MCHC 32.8 32.0  RDW 14.4 14.4  PLT 143* 131*   Thyroid No results for input(s): "TSH", "FREET4" in the last 168 hours.  BNPNo results for input(s): "BNP", "PROBNP" in the last 168 hours.  DDimer No results for input(s): "DDIMER" in the last 168 hours.   Radiology/Studies:  DG UGI W SINGLE CM (SOL OR THIN BA)  Result Date: 08/04/2022 CLINICAL DATA:  Patient with known large hiatal hernia and significant nausea and vomiting. EXAM: DG UGI W SINGLE CM TECHNIQUE: Scout radiograph was obtained. Single contrast examination was performed using thin liquid barium. This exam was performed by Reatha Armour, PA-C , and was supervised and interpreted by Maurine Simmering, MD. A limited study was conducted due to patient mobility and significant nausea during exam. FLUOROSCOPY: Radiation Exposure Index (as provided by the fluoroscopic device): 22.10 mGy Kerma COMPARISON:  CT chest abdomen pelvis with contrast 08/03/2022 FINDINGS: Limited upper GI exam due to patient mobility and significant nausea during the exam. Scout Radiograph: No evidence of bowel obstruction. Prior lumbar fusion. Prior cholecystectomy. Esophagus:  No obvious mucosal abnormality.  Somewhat tortuous. Esophageal motility:  Mild to moderate esophageal dysmotility. Gastroesophageal reflux:  None visualized. Ingested 68mm barium tablet:  Not given. Stomach: Large hiatal hernia as seen on recent CT. No obvious mucosal abnormality on this limited exam. Gastric emptying: Normal.  No evidence of obstruction. Duodenum: The proximal duodenum appears unremarkable. The distal duodenum is not opacified. Other:  None. IMPRESSION: Limited upper GI exam due to patient condition. Large hiatal hernia, as seen on recent CT. No evidence of obstruction. Mild to moderate esophageal dysmotility. Electronically Signed   By: Maurine Simmering M.D.   On: 08/04/2022 11:23   DG Chest 2  View  Result Date: 08/04/2022 CLINICAL DATA:  Hiatal hernia and gastric volvulus. EXAM: CHEST - 2 VIEW COMPARISON:  Chest CT 08/03/2022.  Chest radiograph fall 1223. FINDINGS: Unchanged linear atelectasis in the lung bases. Unchanged volume loss in the right upper lobe with associated distortion of the right hilar contours. No new airspace opacity. Stable cardiac and mediastinal contours with large hiatal hernia. No pleural effusion or pneumothorax. IMPRESSION: 1.  No evidence of acute cardiopulmonary disease. 2. Unchanged large hiatal hernia. Electronically Signed   By: Emmit Alexanders M.D.   On: 08/04/2022 08:23   CT CHEST ABDOMEN PELVIS W CONTRAST  Result Date: 08/03/2022 CLINICAL DATA:  Hiatal hernia with possible gastric volvulus. EXAM: CT CHEST, ABDOMEN, AND PELVIS WITH CONTRAST TECHNIQUE: Multidetector CT imaging of the chest, abdomen and pelvis was performed following the standard protocol  during bolus administration of intravenous contrast. RADIATION DOSE REDUCTION: This exam was performed according to the departmental dose-optimization program which includes automated exposure control, adjustment of the mA and/or kV according to patient size and/or use of iterative reconstruction technique. CONTRAST:  76mL OMNIPAQUE IOHEXOL 350 MG/ML SOLN COMPARISON:  CT PE protocol 04/19/2022 FINDINGS: CT CHEST FINDINGS Cardiovascular: No significant vascular findings. Normal heart size. No pericardial effusion. There are atherosclerotic calcifications of the aorta and coronary arteries. Mediastinum/Nodes: No enlarged mediastinal, hilar, or axillary lymph nodes. Thyroid gland, trachea, and esophagus demonstrate no significant findings. Large hiatal hernia again seen containing the proximal stomach. Lungs/Pleura: There is a 6 mm nodule in the right upper lobe image 5/37. There are atelectatic changes in the lung bases. There is a band of atelectasis in the left upper lobe. There is no pleural effusion or  pneumothorax. Trachea and central airways are patent. Musculoskeletal: There are healed right-sided rib fractures. Degenerative changes affect the spine. CT ABDOMEN PELVIS FINDINGS Hepatobiliary: No focal liver abnormality is seen. Status post cholecystectomy. No biliary dilatation. Pancreas: Unremarkable. No pancreatic ductal dilatation or surrounding inflammatory changes. Spleen: Normal in size without focal abnormality. Adrenals/Urinary Tract: Adrenal glands are unremarkable. Kidneys are normal, without renal calculi, focal lesion, or hydronephrosis. Bladder is unremarkable. Stomach/Bowel: There is a large hiatal hernia containing the proximal stomach. There is no evidence for volvulus. There is moderate distention of the intrathoracic portion of the stomach with air-fluid level. There is no evidence for bowel obstruction, pneumatosis, inflammation or free air. The appendix is not seen. There is sigmoid colon diverticulosis. Vascular/Lymphatic: Aortic atherosclerosis. No enlarged abdominal or pelvic lymph nodes. Reproductive: Status post hysterectomy. No adnexal masses. Other: No abdominal wall hernia or abnormality. No abdominopelvic ascites. Musculoskeletal: L3-L5 posterior fusion hardware is present. IMPRESSION: 1. Large hiatal hernia containing the proximal stomach. There is moderate distention of the intrathoracic portion of the stomach with air-fluid level. No evidence for volvulus. 2. 6 mm solid pulmonary nodule. Per Fleischner Society Guidelines, recommend a non-contrast Chest CT at 6-12 months. If patient is high risk for malignancy, consider an additional non-contrast Chest CT at 18-24 months. If patient is low risk for malignancy, non-contrast Chest CT at 18-24 months is optional. These guidelines do not apply to immunocompromised patients and patients with cancer. Follow up in patients with significant comorbidities as clinically warranted. For lung cancer screening, adhere to Lung-RADS guidelines.  Reference: Radiology. 2017; 284(1):228-43. Aortic Atherosclerosis (ICD10-I70.0). Electronically Signed   By: Ronney Asters M.D.   On: 08/03/2022 21:22     Assessment and Plan:   Large hiatal hernia - pending surgical repair, followed by GI - no gastric volvulus on CT imaging   Exertional chest pain She is somewhat difficult historian concerning chest pain. I think there is a component of deconditioning and her main complaint seems to be dyspnea on exertion. She describes chest pain that radiates from her LUQ and epigastric region after eating. However, since her fall she describes more DOE that can have a chest discomfort component. Her mobility is now limited since her fall in late December.  - at other points in our conversation, she denied chest pain with exertion outside of her normal GI discomfort - reassuring heart catheterizations in 2018 and 2021 - repeat an echocardiogram to evaluate DOE, structure and function - EKG does not appear ischemic - no current chest pain - she does not think addition of amlodipine last August changed her symptoms - will cycle cardiac enzymes - if enzymes  trend up, will need to consider ischemic evaluation - hx of abnormal nuclear stress test and CT coronary that did not translate to obstructive disease on heart cath   Hx of nonobstructive CAD Last heart cath 03/16/20 with mild nonobstructive disease in the LAD (40%) and diagonal branch (50%) No ASA given PUD history   Hyperlipidemia with LDL goal < 70 Needs updated lipid panel 20 mg crestor   Hypertension PTA: 10 mg lisinopril, 5 mg amlodipine Apparently these medications have changed since her last hospitalization - she is not taking amlodipine and lisinopril reduced to 5 mg   IDDA Followed by heme/onc Hb 11.8 / HCT 36.9 No active bleeding   Hypokalemia Hx of prolonged QTC - K 3.2 - replacing with IV given NPO - stat EKG ordered with acceptable QTC   Preoperative risk  evaluation No obstructive CAD, hx of ischemic heart disease, or CHF. No history of stroke or insulin. Prior to her fall in late December, she was able to achieve more than 4.0 METS without exertional angina, although symptoms are difficult given her ongoing chronic pain, GI issues, and deconditioning. We will obtain an echocardiogram. If this is largely unremarkable, I think she can proceed with surgery. If echo shows WMA or reduction in LVEF, we may need to proceed with a diagnostic heart cath to better understand her risks. We discussed her risk of MACE currently, likely 0.9%. She is willing to proceed pending echo results.   Risk Assessment/Risk Scores:       For questions or updates, please contact [place] Please consult [email or web address] for contact info under    Signed, Ledora Bottcher, PA  08/04/2022 12:11 PM  History and all data above reviewed.  Patient examined.  I agree with the findings as above.   The patient has a history of chest pain.  She had non obstructive disease on cath in 2021.  ( Reviewed these images for this note.  She is limited by joint pain and walks with crutches.  She walks through the grocery store and up five stairs routinely.  She denies any recent new chest pain.  She has chronic pain similar to symptoms that she had in 2021.  There was a question about chest pain in 2023 and consideration given to repeat cath.  However, she was subsequently found to have severe anemia and did not undergo cath.   She has had work up and treatment of iron deficient anemia without source of bleeding noted.  Since that 2023 visit she reports no change in intermittent chest pain that she ascribes to her hiatal hernia.  She thinks this is a stable pattern.  She has had some chronic dyspnea but no PND or orthopnea.   She presents with nausea and vomiting and is being considered for surgical repair of a hiatal hernia.  The patient exam reveals COR:RRR  ,  Lungs: Clear  ,  Abd:  Positive bowel sounds, no rebound no guarding, Ext No edema  .  All available labs, radiology testing, previous records reviewed. Agree with documented assessment and plan.   Preop:   After careful questioning it seems that the patient has no new chest pain or SOB symptoms since her cath in 2021. There is no objective evidence of ischemia.  She is able to work with PT and climb stairs into her RV. She is not going for a high risk procedure.  Given this, and according to ACC/AHA guidelines no perfusion imaging is warranted.  For  completeness sake I will cycle cardiac enzymes (she did have some of her usual pain earlier this admission) and check an echo for SOB as was planned last year.   Jeneen Rinks Nykia Turko  4:05 PM  08/04/2022

## 2022-08-04 NOTE — Plan of Care (Signed)

## 2022-08-04 NOTE — Progress Notes (Signed)
[facility] Progress Note     Subjective: CC-  Continues to have abdominal pain and nausea. No emesis since admission but she is belching some. Nausea is chronic for her, but this is worse than her baseline. States that the pain is diffuse but worst in the LUQ.   Last seen by her cardiologist 12/2021 where they wanted to schedule a cardiac cath. This was cancelled due to anemia and has not been rescheduled. She does continue to have CP/SOB on exertion.  Lives at home with significant other Ambulates with walker Nonsmoker, denies alcohol or illicit drug use  Objective: Vital signs in last 24 hours: Temp:  [97.6 F (36.4 C)-97.9 F (36.6 C)] 97.6 F (36.4 C) (03/28 0737) Pulse Rate:  [62-67] 67 (03/28 0737) Resp:  [16-18] 16 (03/28 0737) BP: (123-147)/(66-77) 123/74 (03/28 0737) SpO2:  [92 %-98 %] 93 % (03/28 0737) Weight:  [88.5 kg] 88.5 kg (03/27 1646) Last BM Date : 08/03/22  Intake/Output from previous day: No intake/output data recorded. Intake/Output this shift: No intake/output data recorded.  PE: Gen:  Alert, NAD, pleasant Card:  RRR Pulm:  CTAB, no W/R/R, rate and effort normal on room air Abd: Soft, ND, mild diffuse tenderness/ mostly in the LUQ, no rebound or guarding, no peritonitis Psych: A&Ox4   Lab Results:  Recent Labs    08/03/22 1735 08/04/22 0230  WBC 2.8* 2.6*  HGB 12.2 11.8*  HCT 37.2 36.9  PLT 143* 131*   BMET Recent Labs    08/03/22 1735 08/04/22 0230  NA 138 139  K 3.3* 3.2*  CL 105 105  CO2 25 27  GLUCOSE 94 115*  BUN 11 11  CREATININE 0.92 0.84  CALCIUM 8.9 8.8*   PT/INR No results for input(s): "LABPROT", "INR" in the last 72 hours. CMP     Component Value Date/Time   NA 139 08/04/2022 0230   NA 141 01/03/2022 1454   K 3.2 (L) 08/04/2022 0230   CL 105 08/04/2022 0230   CO2 27 08/04/2022 0230   GLUCOSE 115 (H) 08/04/2022 0230   BUN 11 08/04/2022 0230   BUN 10 01/03/2022 1454   CREATININE 0.84 08/04/2022  0230   CREATININE 1.01 (H) 06/01/2022 1412   CALCIUM 8.8 (L) 08/04/2022 0230   PROT 6.9 08/03/2022 1735   ALBUMIN 3.5 08/03/2022 1735   AST 27 08/03/2022 1735   AST 21 06/01/2022 1412   ALT 20 08/03/2022 1735   ALT 15 06/01/2022 1412   ALKPHOS 146 (H) 08/03/2022 1735   BILITOT 0.3 08/03/2022 1735   BILITOT 0.1 (L) 06/01/2022 1412   GFRNONAA >60 08/04/2022 0230   GFRNONAA >60 06/01/2022 1412   GFRAA 61 03/13/2020 1140   GFRAA >60 02/25/2019 1438   Lipase  No results found for: "LIPASE"     Studies/Results: CT CHEST ABDOMEN PELVIS W CONTRAST  Result Date: 08/03/2022 CLINICAL DATA:  Hiatal hernia with possible gastric volvulus. EXAM: CT CHEST, ABDOMEN, AND PELVIS WITH CONTRAST TECHNIQUE: Multidetector CT imaging of the chest, abdomen and pelvis was performed following the standard protocol during bolus administration of intravenous contrast. RADIATION DOSE REDUCTION: This exam was performed according to the departmental dose-optimization program which includes automated exposure control, adjustment of the mA and/or kV according to patient size and/or use of iterative reconstruction technique. CONTRAST:  72mL OMNIPAQUE IOHEXOL 350 MG/ML SOLN COMPARISON:  CT PE protocol 04/19/2022 FINDINGS: CT CHEST FINDINGS Cardiovascular: No significant vascular findings. Normal heart size. No pericardial effusion. There are atherosclerotic calcifications of  the aorta and coronary arteries. Mediastinum/Nodes: No enlarged mediastinal, hilar, or axillary lymph nodes. Thyroid gland, trachea, and esophagus demonstrate no significant findings. Large hiatal hernia again seen containing the proximal stomach. Lungs/Pleura: There is a 6 mm nodule in the right upper lobe image 5/37. There are atelectatic changes in the lung bases. There is a band of atelectasis in the left upper lobe. There is no pleural effusion or pneumothorax. Trachea and central airways are patent. Musculoskeletal: There are healed right-sided rib  fractures. Degenerative changes affect the spine. CT ABDOMEN PELVIS FINDINGS Hepatobiliary: No focal liver abnormality is seen. Status post cholecystectomy. No biliary dilatation. Pancreas: Unremarkable. No pancreatic ductal dilatation or surrounding inflammatory changes. Spleen: Normal in size without focal abnormality. Adrenals/Urinary Tract: Adrenal glands are unremarkable. Kidneys are normal, without renal calculi, focal lesion, or hydronephrosis. Bladder is unremarkable. Stomach/Bowel: There is a large hiatal hernia containing the proximal stomach. There is no evidence for volvulus. There is moderate distention of the intrathoracic portion of the stomach with air-fluid level. There is no evidence for bowel obstruction, pneumatosis, inflammation or free air. The appendix is not seen. There is sigmoid colon diverticulosis. Vascular/Lymphatic: Aortic atherosclerosis. No enlarged abdominal or pelvic lymph nodes. Reproductive: Status post hysterectomy. No adnexal masses. Other: No abdominal wall hernia or abnormality. No abdominopelvic ascites. Musculoskeletal: L3-L5 posterior fusion hardware is present. IMPRESSION: 1. Large hiatal hernia containing the proximal stomach. There is moderate distention of the intrathoracic portion of the stomach with air-fluid level. No evidence for volvulus. 2. 6 mm solid pulmonary nodule. Per Fleischner Society Guidelines, recommend a non-contrast Chest CT at 6-12 months. If patient is high risk for malignancy, consider an additional non-contrast Chest CT at 18-24 months. If patient is low risk for malignancy, non-contrast Chest CT at 18-24 months is optional. These guidelines do not apply to immunocompromised patients and patients with cancer. Follow up in patients with significant comorbidities as clinically warranted. For lung cancer screening, adhere to Lung-RADS guidelines. Reference: Radiology. 2017; 284(1):228-43. Aortic Atherosclerosis (ICD10-I70.0). Electronically Signed    By: Ronney Asters M.D.   On: 08/03/2022 21:22    Anti-infectives: Anti-infectives (From admission, onward)    None        Assessment/Plan Large hiatal hernia - CT 3/27  shows a large hiatal hernia containing the proximal stomach, there is moderate distention of the intrathoracic portion of the stomach with air-fluid level, no evidence for volvulus - last EGD 07/01/22 Dr. Therisa Doyne: tortuous lower 1/3 esophagus, 10cm hiatal hernia, patchy mildly erythematous mucosa without bleeding in gastric antrum and body - No signs of gastric volvulus on CT scan, no indication for emergent surgical intervention today. She is nauseated but not vomiting. Will hold on NG tube placement. UGI ordered for today. Otherwise keep NPO.  Will ask cardiology to see for preoperative risk stratification/ to see if any further work up is indicated given that she was supposed to have a cardiac cath last year and did not and continues to have exertional chest pain and shortness of breath. She does not need emergent surgery today, but could need something done this admission pending further work up.  ID - none FEN - IVF, NPO VTE - SCDs, sq heparin Foley - none  HTN HLD GERD Anxiety/depression Fibromyalgia, chronic pain CKD Nonobstructive CAD IDA Incidental finding: pulmonary nodule, follow up PCP  I reviewed last 24 h vitals and pain scores, last 48 h intake and output, last 24 h labs and trends, and last 24 h imaging results. I called  and spoke with cardiology for consult.     LOS: 1 day    Wellington Hampshire, [facility] 08/04/2022, 8:22 AM Please see Amion for pager number during day hours 7:00am-4:30pm

## 2022-08-04 NOTE — Progress Notes (Signed)
This encounter was created in error - please disregard.

## 2022-08-05 ENCOUNTER — Inpatient Hospital Stay (HOSPITAL_COMMUNITY): Payer: Medicare Other

## 2022-08-05 DIAGNOSIS — R0609 Other forms of dyspnea: Secondary | ICD-10-CM

## 2022-08-05 DIAGNOSIS — K3189 Other diseases of stomach and duodenum: Secondary | ICD-10-CM | POA: Diagnosis not present

## 2022-08-05 LAB — ECHOCARDIOGRAM COMPLETE
AR max vel: 1.97 cm2
AV Area VTI: 2.19 cm2
AV Area mean vel: 2.04 cm2
AV Mean grad: 5 mmHg
AV Peak grad: 11 mmHg
Ao pk vel: 1.66 m/s
Height: 63 in
S' Lateral: 2.5 cm
Weight: 3120 oz

## 2022-08-05 LAB — CBC
HCT: 36.1 % (ref 36.0–46.0)
Hemoglobin: 11.7 g/dL — ABNORMAL LOW (ref 12.0–15.0)
MCH: 25.9 pg — ABNORMAL LOW (ref 26.0–34.0)
MCHC: 32.4 g/dL (ref 30.0–36.0)
MCV: 80 fL (ref 80.0–100.0)
Platelets: 154 10*3/uL (ref 150–400)
RBC: 4.51 MIL/uL (ref 3.87–5.11)
RDW: 14.3 % (ref 11.5–15.5)
WBC: 2.6 10*3/uL — ABNORMAL LOW (ref 4.0–10.5)
nRBC: 0 % (ref 0.0–0.2)

## 2022-08-05 LAB — BASIC METABOLIC PANEL
Anion gap: 10 (ref 5–15)
BUN: 7 mg/dL — ABNORMAL LOW (ref 8–23)
CO2: 21 mmol/L — ABNORMAL LOW (ref 22–32)
Calcium: 9 mg/dL (ref 8.9–10.3)
Chloride: 108 mmol/L (ref 98–111)
Creatinine, Ser: 0.92 mg/dL (ref 0.44–1.00)
GFR, Estimated: >60 mL/min (ref >60)
Glucose, Bld: 104 mg/dL — ABNORMAL HIGH (ref 70–99)
Potassium: 3.5 mmol/L (ref 3.5–5.1)
Sodium: 139 mmol/L (ref 135–145)

## 2022-08-05 MED ORDER — LIP MEDEX EX OINT
TOPICAL_OINTMENT | Freq: Two times a day (BID) | CUTANEOUS | Status: DC
  Administered 2022-08-05 – 2022-08-06 (×2): 75 via TOPICAL
  Filled 2022-08-05: qty 7

## 2022-08-05 MED ORDER — SODIUM CHLORIDE 0.9% FLUSH
3.0000 mL | Freq: Two times a day (BID) | INTRAVENOUS | Status: DC
  Administered 2022-08-05 – 2022-08-09 (×9): 3 mL via INTRAVENOUS

## 2022-08-05 MED ORDER — BISACODYL 10 MG RE SUPP
10.0000 mg | Freq: Every day | RECTAL | Status: DC
  Filled 2022-08-05 (×5): qty 1

## 2022-08-05 MED ORDER — CALCIUM POLYCARBOPHIL 625 MG PO TABS
625.0000 mg | ORAL_TABLET | Freq: Two times a day (BID) | ORAL | Status: DC
  Administered 2022-08-05 – 2022-08-15 (×20): 625 mg via ORAL
  Filled 2022-08-05 (×23): qty 1

## 2022-08-05 MED ORDER — SODIUM CHLORIDE 0.9% FLUSH: 3.0000 mL | INTRAVENOUS | Status: DC | PRN

## 2022-08-05 MED ORDER — PHENOL 1.4 % MT LIQD: 2.0000 | OROMUCOSAL | Status: DC | PRN

## 2022-08-05 MED ORDER — PROCHLORPERAZINE EDISYLATE 10 MG/2ML IJ SOLN
5.0000 mg | INTRAMUSCULAR | Status: DC | PRN
  Administered 2022-08-14: 10 mg via INTRAVENOUS
  Filled 2022-08-05: qty 2

## 2022-08-05 MED ORDER — METHOCARBAMOL 1000 MG/10ML IJ SOLN
1000.0000 mg | Freq: Four times a day (QID) | INTRAVENOUS | Status: DC | PRN
  Filled 2022-08-05: qty 10

## 2022-08-05 MED ORDER — MAGIC MOUTHWASH: 15.0000 mL | Freq: Four times a day (QID) | ORAL | Status: DC | PRN

## 2022-08-05 MED ORDER — SODIUM CHLORIDE 0.9 % IV SOLN: 250.0000 mL | INTRAVENOUS | Status: DC | PRN

## 2022-08-05 MED ORDER — ALUM & MAG HYDROXIDE-SIMETH 200-200-20 MG/5ML PO SUSP
30.0000 mL | Freq: Four times a day (QID) | ORAL | Status: DC | PRN
  Administered 2022-08-11: 30 mL via ORAL
  Filled 2022-08-05: qty 30

## 2022-08-05 MED ORDER — DEXAMETHASONE SODIUM PHOSPHATE 10 MG/ML IJ SOLN
4.0000 mg | Freq: Two times a day (BID) | INTRAMUSCULAR | Status: AC
  Administered 2022-08-05 – 2022-08-07 (×6): 4 mg via INTRAVENOUS
  Filled 2022-08-05 (×6): qty 1

## 2022-08-05 MED ORDER — SIMETHICONE 80 MG PO CHEW
80.0000 mg | CHEWABLE_TABLET | Freq: Four times a day (QID) | ORAL | Status: AC
  Administered 2022-08-05 – 2022-08-08 (×12): 80 mg via ORAL
  Filled 2022-08-05 (×14): qty 1

## 2022-08-05 MED ORDER — LACTATED RINGERS IV BOLUS: 1000.0000 mL | Freq: Three times a day (TID) | INTRAVENOUS | Status: AC | PRN

## 2022-08-05 MED ORDER — METOCLOPRAMIDE HCL 5 MG/ML IJ SOLN
5.0000 mg | Freq: Three times a day (TID) | INTRAMUSCULAR | Status: DC | PRN
  Administered 2022-08-09: 10 mg via INTRAVENOUS
  Filled 2022-08-05: qty 2

## 2022-08-05 MED ORDER — MENTHOL 3 MG MT LOZG: 1.0000 | LOZENGE | OROMUCOSAL | Status: DC | PRN

## 2022-08-05 NOTE — Progress Notes (Signed)
Echocardiogram 2D Echocardiogram has been performed.  [name] 08/05/2022, 9:50 AM

## 2022-08-05 NOTE — Progress Notes (Signed)
   Patient Name: [name] Date of Encounter: 08/05/2022 [place] Cardiologist: Minus Breeding, MD (new)  Interval Summary   No chest pain overnight.  Vital Signs   Vitals:   08/04/22 1324 08/04/22 2032 08/05/22 0615 08/05/22 0813  BP: 111/64 126/66 96/64 110/70  Pulse: (!) 59 (!) 56 (!) 57 (!) 55  Resp: 17 17 17 17   Temp: 98 F (36.7 C) 98.4 F (36.9 C) 98 F (36.7 C) (!) 97.5 F (36.4 C)  TempSrc: Oral Oral Oral Oral  SpO2: 91% 93% 94% 97%  Weight:      Height:       No intake or output data in the 24 hours ending 08/05/22 0841    08/03/2022    4:46 PM 02/24/2022    1:42 PM 02/07/2022   12:35 PM  Last 3 Weights  Weight (lbs) 195 lb 202 lb 205 lb 12.8 oz  Weight (kg) 88.451 kg 91.627 kg 93.35 kg      Telemetry/ECG    NA - Personally Reviewed  Physical Exam   GEN: No acute distress.     Assessment & Plan    Chest pain:  Trop negative.  No further cardiac work up.    Preop:  The patient is at acceptable risk for the planned surgery without further work up.    SOB:  Echo pending.  We will follow up.    Please call with further questions.     For questions or updates, please contact [facility] Please consult [email or web address] for contact info under        Signed, Minus Breeding, MD

## 2022-08-05 NOTE — Care Management Important Message (Signed)
Important Message  Patient Details  Name: [name] MRN: [id number] Date of Birth: 08-11-1955   Medicare Important Message Given:  Yes     Hannah Beat 08/05/2022, 12:27 PM

## 2022-08-05 NOTE — Progress Notes (Signed)
[name] [id number]:[id number] 05/27/1955  CARE TEAM:  PCP: [name], MD  Outpatient Care Team: Patient Care Team: [name], MD as PCP - General (Internal Medicine) Minus Breeding, MD as PCP - Cardiology (Cardiology) [name] (Gastroenterology) [name], MD as Consulting Physician (Gastroenterology)  Inpatient Treatment Team: Treatment Team: Attending Provider: Edison Pace, Md, MD; Attending Physician: Edison Pace Md, MD; Consulting Physician: Edison Pace, Md, MD; Rounding Team: [facility], Rounding, MD; Technician: Langley Adie, NT; Case Manager: Cecille Rubin, RN; Pharmacist: [name], [facility]; Utilization Review: [name], RN; Registered Nurse: Luiz Iron, RN; Social Worker: Waynette Buttery, LCSW   Problem List:   Principal Problem:   Incarcerated hiatal hernia Active Problems:   Tachycardia-bradycardia syndrome ([place])      * No surgery found *      Assessment  Persistent dysphagia with chronically incarcerated hiatal hernia  [facility] Stay = 2 days)  Plan:  Assessment/Plan Incarcerated hiatal hernia with worsening dysphagia - CT 3/27  shows a large hiatal hernia containing the proximal stomach, there is moderate distention of the intrathoracic portion of the stomach with air-fluid level, no evidence for volvulus - last EGD 07/01/22 Dr. Therisa Doyne: tortuous lower 1/3 esophagus, 10cm hiatal hernia, patchy mildly erythematous mucosa without bleeding in gastric antrum and body - No signs of gastric volvulus on CT scan, no indication for emergent surgical intervention today. She is nauseated but not vomiting. Will hold on NG tube placement. UGI ordered for today. -Continue twice daily PPI's.  Scheduled simethicone.  Try and get up and ambulate more.  She does not seem dehydrated me so stop IV fluids with.  Backup.  Trial of steroids x 72 hours.     Will ask cardiology to see for preoperative risk stratification/ to see if any  further work up is indicated given that she was supposed to have a cardiac cath last year and did not and continues to have exertional chest pain and shortness of breath. She does not need emergent surgery today, but could need something done this admission pending further work up.   ID - none FEN - IVF, Clear sips VTE - SCDs, sq heparin Foley - none   HTN HLD GERD Anxiety/depression Fibromyalgia, chronic pain CKD Nonobstructive CAD IDA Incidental finding: pulmonary nodule, follow up PCP        I reviewed nursing notes, Consultant surgery, GI notes, last 24 h vitals and pain scores, last 48 h intake and output, last 24 h labs and trends, and last 24 h imaging results. I have reviewed this patient's available data, including medical history, events of note, test results, etc as part of my evaluation.  A significant portion of that time was spent in counseling.  Care during the described time interval was provided by me.  This care required moderate level of medical decision making.  08/05/2022    Subjective: (Chief complaint)  Husband in room.  Patient with some burning dysphagia to liquids.  No retching or vomiting yet.  Objective:  Vital signs:  Vitals:   08/04/22 1324 08/04/22 2032 08/05/22 0615 08/05/22 0813  BP: 111/64 126/66 96/64 110/70  Pulse: (!) 59 (!) 56 (!) 57 (!) 55  Resp: 17 17 17 17   Temp: 98 F (36.7 C) 98.4 F (36.9 C) 98 F (36.7 C) (!) 97.5 F (36.4 C)  TempSrc: Oral Oral Oral Oral  SpO2: 91% 93% 94% 97%  Weight:      Height:  Last BM Date : 08/03/22  Intake/Output   Yesterday:  No intake/output data recorded. This shift:  No intake/output data recorded.  Bowel function:  Flatus: No  BM:  No  Drain: (No drain)   Physical Exam:  General: Pt awake/alert in no acute distress Eyes: PERRL, normal EOM.  Sclera clear.  No icterus Neuro: CN II-XII intact w/o focal sensory/motor deficits. Lymph: No head/neck/groin  lymphadenopathy Psych:  No delerium/psychosis/paranoia.  Oriented x 4 HENT: Normocephalic, Mucus membranes moist.  No thrush Neck: Supple, No tracheal deviation.  No obvious thyromegaly Chest: No pain to chest wall compression.  Good respiratory excursion.  No audible wheezing CV:  Pulses intact.  Regular rhythm.  No major extremity edema MS: Normal AROM mjr joints.  No obvious deformity  Abdomen: Soft.  Nondistended.  Nontender.  No evidence of peritonitis.  No incarcerated hernias.  Ext:   No deformity.  No mjr edema.  No cyanosis Skin: No petechiae / purpurea.  No major sores.  Warm and dry    Results:   Cultures: No results found for this or any previous visit (from the past 720 hour(s)).  Labs: Results for orders placed or performed during the hospital encounter of 08/03/22 (from the past 48 hour(s))  Comprehensive metabolic panel     Status: Abnormal   Collection Time: 08/03/22  5:35 PM  Result Value Ref Range   Sodium 138 135 - 145 mmol/L   Potassium 3.3 (L) 3.5 - 5.1 mmol/L   Chloride 105 98 - 111 mmol/L   CO2 25 22 - 32 mmol/L   Glucose, Bld 94 70 - 99 mg/dL    Comment: Glucose reference range applies only to samples taken after fasting for at least 8 hours.   BUN 11 8 - 23 mg/dL   Creatinine, Ser 0.92 0.44 - 1.00 mg/dL   Calcium 8.9 8.9 - 10.3 mg/dL   Total Protein 6.9 6.5 - 8.1 g/dL   Albumin 3.5 3.5 - 5.0 g/dL   AST 27 15 - 41 U/L   ALT 20 0 - 44 U/L   Alkaline Phosphatase 146 (H) 38 - 126 U/L   Total Bilirubin 0.3 0.3 - 1.2 mg/dL   GFR, Estimated >60 >60 mL/min    Comment: (NOTE) Calculated using the CKD-EPI Creatinine Equation (2021)    Anion gap 8 5 - 15    Comment: Performed at [facility] [address] [place], [place] [address]  CBC WITH DIFFERENTIAL     Status: Abnormal   Collection Time: 08/03/22  5:35 PM  Result Value Ref Range   WBC 2.8 (L) 4.0 - 10.5 K/uL   RBC 4.65 3.87 - 5.11 MIL/uL   Hemoglobin 12.2 12.0 - 15.0 g/dL   HCT 37.2  36.0 - 46.0 %   MCV 80.0 80.0 - 100.0 fL   MCH 26.2 26.0 - 34.0 pg   MCHC 32.8 30.0 - 36.0 g/dL   RDW 14.4 11.5 - 15.5 %   Platelets 143 (L) 150 - 400 K/uL   nRBC 0.0 0.0 - 0.2 %   Neutrophils Relative % 46 %   Neutro Abs 1.3 (L) 1.7 - 7.7 K/uL   Lymphocytes Relative 33 %   Lymphs Abs 0.9 0.7 - 4.0 K/uL   Monocytes Relative 16 %   Monocytes Absolute 0.4 0.1 - 1.0 K/uL   Eosinophils Relative 4 %   Eosinophils Absolute 0.1 0.0 - 0.5 K/uL   Basophils Relative 1 %   Basophils Absolute 0.0 0.0 -  0.1 K/uL   Immature Granulocytes 0 %   Abs Immature Granulocytes 0.01 0.00 - 0.07 K/uL    Comment: Performed at [facility], [place] [address] [place], [place] [address]  CBC     Status: Abnormal   Collection Time: 08/04/22  2:30 AM  Result Value Ref Range   WBC 2.6 (L) 4.0 - 10.5 K/uL   RBC 4.52 3.87 - 5.11 MIL/uL   Hemoglobin 11.8 (L) 12.0 - 15.0 g/dL   HCT 36.9 36.0 - 46.0 %   MCV 81.6 80.0 - 100.0 fL   MCH 26.1 26.0 - 34.0 pg   MCHC 32.0 30.0 - 36.0 g/dL   RDW 14.4 11.5 - 15.5 %   Platelets 131 (L) 150 - 400 K/uL    Comment: REPEATED TO VERIFY   nRBC 0.0 0.0 - 0.2 %    Comment: Performed at [facility], [place] [address], [place], [place] [id number]  Basic metabolic panel     Status: Abnormal   Collection Time: 08/04/22  2:30 AM  Result Value Ref Range   Sodium 139 135 - 145 mmol/L   Potassium 3.2 (L) 3.5 - 5.1 mmol/L   Chloride 105 98 - 111 mmol/L   CO2 27 22 - 32 mmol/L   Glucose, Bld 115 (H) 70 - 99 mg/dL    Comment: Glucose reference range applies only to samples taken after fasting for at least 8 hours.   BUN 11 8 - 23 mg/dL   Creatinine, Ser 0.84 0.44 - 1.00 mg/dL   Calcium 8.8 (L) 8.9 - 10.3 mg/dL   GFR, Estimated >60 >60 mL/min    Comment: (NOTE) Calculated using the CKD-EPI Creatinine Equation (2021)    Anion gap 7 5 - 15    Comment: Performed at [facility] [address] [place], [place] [address]  Magnesium     Status: None   Collection Time:  08/04/22  2:30 AM  Result Value Ref Range   Magnesium 2.1 1.7 - 2.4 mg/dL    Comment: Performed at [facility] [address], [place], [place] [address]  Troponin I (High Sensitivity)     Status: None   Collection Time: 08/04/22  1:02 PM  Result Value Ref Range   Troponin I (High Sensitivity) 4 <18 ng/L    Comment: (NOTE) Elevated high sensitivity troponin I (hsTnI) values and significant  changes across serial measurements may suggest ACS but many other  chronic and acute conditions are known to elevate hsTnI results.  Refer to the "Links" section for chest pain algorithms and additional  guidance. Performed at [facility], [place] [address], [place],  [address]   Troponin I (High Sensitivity)     Status: None   Collection Time: 08/04/22  3:11 PM  Result Value Ref Range   Troponin I (High Sensitivity) 3 <18 ng/L    Comment: (NOTE) Elevated high sensitivity troponin I (hsTnI) values and significant  changes across serial measurements may suggest ACS but many other  chronic and acute conditions are known to elevate hsTnI results.  Refer to the "Links" section for chest pain algorithms and additional  guidance. Performed at [facility], [place] [address] [place] [address]   CBC     Status: Abnormal   Collection Time: 08/05/22  5:01 AM  Result Value Ref Range   WBC 2.6 (L) 4.0 - 10.5 K/uL   RBC 4.51 3.87 - 5.11 MIL/uL  Hemoglobin 11.7 (L) 12.0 - 15.0 g/dL   HCT 36.1 36.0 - 46.0 %   MCV 80.0 80.0 - 100.0 fL   MCH 25.9 (L) 26.0 - 34.0 pg   MCHC 32.4 30.0 - 36.0 g/dL   RDW 14.3 11.5 - 15.5 %   Platelets 154 150 - 400 K/uL   nRBC 0.0 0.0 - 0.2 %    Comment: Performed at [facility] [address], [place], [place] [id number]  Basic metabolic panel     Status: Abnormal   Collection Time: 08/05/22  5:01 AM  Result Value Ref Range   Sodium 139 135 - 145 mmol/L   Potassium 3.5 3.5 - 5.1 mmol/L   Chloride 108 98 - 111 mmol/L   CO2 21 (L)  22 - 32 mmol/L   Glucose, Bld 104 (H) 70 - 99 mg/dL    Comment: Glucose reference range applies only to samples taken after fasting for at least 8 hours.   BUN 7 (L) 8 - 23 mg/dL   Creatinine, Ser 0.92 0.44 - 1.00 mg/dL   Calcium 9.0 8.9 - 10.3 mg/dL   GFR, Estimated >60 >60 mL/min    Comment: (NOTE) Calculated using the CKD-EPI Creatinine Equation (2021)    Anion gap 10 5 - 15    Comment: Performed at [facility] [address] [place], [place] [address]    Imaging / Studies: ECHOCARDIOGRAM COMPLETE  Result Date: 08/05/2022    ECHOCARDIOGRAM REPORT   Patient Name:   HELEANA OKELLY Date of Exam: 08/05/2022 Medical Rec #:  [id number]          Height:       63.0 in Accession #:    [id number]         Weight:       195.0 lb Date of Birth:  04-Dec-1955           BSA:          1.913 m Patient Age:    16 years           BP:           110/70 mmHg Patient Gender: F                  HR:           55 bpm. Exam Location:  Inpatient Procedure: 2D Echo, Cardiac Doppler and Color Doppler Indications:    Dyspnea R06.00  History:        Patient has prior history of Echocardiogram examinations, most                 recent 11/17/2016. Angina and CAD, Arrythmias:Tachycardia and                 Bradycardia; Signs/Symptoms:Chest Pain.  Sonographer:    Ronny Flurry Referring Phys: [id number] [place] [facility]  Sonographer Comments: Image acquisition challenging due to uncooperative patient. IMPRESSIONS  1. Left ventricular ejection fraction, by estimation, is 60 to 65%. The left ventricle has normal function. The left ventricle has no regional wall motion abnormalities. There is mild left ventricular hypertrophy. Left ventricular diastolic parameters were normal.  2. Right ventricular systolic function is normal. The right ventricular size is normal.  3. No evidence of mitral valve regurgitation.  4. The aortic valve is grossly normal. Aortic valve regurgitation is not visualized.  Conclusion(s)/Recommendation(s): This is a limited study, patient noted to be uncooperative. FINDINGS  Left Ventricle: Left ventricular ejection fraction,  by estimation, is 60 to 65%. The left ventricle has normal function. The left ventricle has no regional wall motion abnormalities. The left ventricular internal cavity size was normal in size. There is  mild left ventricular hypertrophy. Left ventricular diastolic parameters were normal. Right Ventricle: The right ventricular size is normal. Right ventricular systolic function is normal. Left Atrium: Left atrial size was normal in size. Right Atrium: Right atrial size was normal in size. Pericardium: There is no evidence of pericardial effusion. Mitral Valve: No evidence of mitral valve regurgitation. Tricuspid Valve: Tricuspid valve regurgitation is mild. Aortic Valve: The aortic valve is grossly normal. Aortic valve regurgitation is not visualized. Aortic valve mean gradient measures 5.0 mmHg. Aortic valve peak gradient measures 11.0 mmHg. Aortic valve area, by VTI measures 2.19 cm. Aorta: The aortic root and ascending aorta are structurally normal, with no evidence of dilitation. IAS/Shunts: No atrial level shunt detected by color flow Doppler.  LEFT VENTRICLE PLAX 2D LVIDd:         3.90 cm LVIDs:         2.50 cm LV PW:         1.10 cm LV IVS:        0.90 cm LVOT diam:     2.00 cm LV SV:         81 LV SV Index:   42 LVOT Area:     3.14 cm  LEFT ATRIUM           Index        RIGHT ATRIUM           Index LA diam:      4.60 cm 2.40 cm/m   RA Area:     16.80 cm LA Vol (A4C): 51.1 ml 26.71 ml/m  RA Volume:   41.60 ml  21.75 ml/m  AORTIC VALVE AV Area (Vmax):    1.97 cm AV Area (Vmean):   2.04 cm AV Area (VTI):     2.19 cm AV Vmax:           166.00 cm/s AV Vmean:          111.000 cm/s AV VTI:            0.370 m AV Peak Grad:      11.0 mmHg AV Mean Grad:      5.0 mmHg LVOT Vmax:         104.00 cm/s LVOT Vmean:        72.200 cm/s LVOT VTI:          0.258 m  LVOT/AV VTI ratio: 0.70  AORTA Ao Root diam: 3.70 cm Ao Asc diam:  2.60 cm TRICUSPID VALVE TR Peak grad:   18.8 mmHg TR Vmax:        217.00 cm/s  SHUNTS Systemic VTI:  0.26 m Systemic Diam: 2.00 cm [name] Electronically signed by [name] Signature Date/Time: 08/05/2022/10:34:10 AM    Final    DG UGI W SINGLE CM (SOL OR THIN BA)  Result Date: 08/04/2022 CLINICAL DATA:  Patient with known large hiatal hernia and significant nausea and vomiting. EXAM: DG UGI W SINGLE CM TECHNIQUE: Scout radiograph was obtained. Single contrast examination was performed using thin liquid barium. This exam was performed by Reatha Armour, PA-C , and was supervised and interpreted by Maurine Simmering, MD. A limited study was conducted due to patient mobility and significant nausea during exam. FLUOROSCOPY: Radiation Exposure Index (as provided by the fluoroscopic device): 22.10 mGy Kerma COMPARISON:  CT chest abdomen pelvis with contrast 08/03/2022 FINDINGS: Limited upper GI exam due to patient mobility and significant nausea during the exam. Scout Radiograph: No evidence of bowel obstruction. Prior lumbar fusion. Prior cholecystectomy. Esophagus:  No obvious mucosal abnormality.  Somewhat tortuous. Esophageal motility:  Mild to moderate esophageal dysmotility. Gastroesophageal reflux:  None visualized. Ingested 45mm barium tablet:  Not given. Stomach: Large hiatal hernia as seen on recent CT. No obvious mucosal abnormality on this limited exam. Gastric emptying: Normal.  No evidence of obstruction. Duodenum: The proximal duodenum appears unremarkable. The distal duodenum is not opacified. Other:  None. IMPRESSION: Limited upper GI exam due to patient condition. Large hiatal hernia, as seen on recent CT. No evidence of obstruction. Mild to moderate esophageal dysmotility. Electronically Signed   By: Maurine Simmering M.D.   On: 08/04/2022 11:23   DG Chest 2 View  Result Date: 08/04/2022 CLINICAL DATA:  Hiatal hernia and gastric  volvulus. EXAM: CHEST - 2 VIEW COMPARISON:  Chest CT 08/03/2022.  Chest radiograph fall 1223. FINDINGS: Unchanged linear atelectasis in the lung bases. Unchanged volume loss in the right upper lobe with associated distortion of the right hilar contours. No new airspace opacity. Stable cardiac and mediastinal contours with large hiatal hernia. No pleural effusion or pneumothorax. IMPRESSION: 1.  No evidence of acute cardiopulmonary disease. 2. Unchanged large hiatal hernia. Electronically Signed   By: Emmit Alexanders M.D.   On: 08/04/2022 08:23   CT CHEST ABDOMEN PELVIS W CONTRAST  Result Date: 08/03/2022 CLINICAL DATA:  Hiatal hernia with possible gastric volvulus. EXAM: CT CHEST, ABDOMEN, AND PELVIS WITH CONTRAST TECHNIQUE: Multidetector CT imaging of the chest, abdomen and pelvis was performed following the standard protocol during bolus administration of intravenous contrast. RADIATION DOSE REDUCTION: This exam was performed according to the departmental dose-optimization program which includes automated exposure control, adjustment of the mA and/or kV according to patient size and/or use of iterative reconstruction technique. CONTRAST:  32mL OMNIPAQUE IOHEXOL 350 MG/ML SOLN COMPARISON:  CT PE protocol 04/19/2022 FINDINGS: CT CHEST FINDINGS Cardiovascular: No significant vascular findings. Normal heart size. No pericardial effusion. There are atherosclerotic calcifications of the aorta and coronary arteries. Mediastinum/Nodes: No enlarged mediastinal, hilar, or axillary lymph nodes. Thyroid gland, trachea, and esophagus demonstrate no significant findings. Large hiatal hernia again seen containing the proximal stomach. Lungs/Pleura: There is a 6 mm nodule in the right upper lobe image 5/37. There are atelectatic changes in the lung bases. There is a band of atelectasis in the left upper lobe. There is no pleural effusion or pneumothorax. Trachea and central airways are patent. Musculoskeletal: There are  healed right-sided rib fractures. Degenerative changes affect the spine. CT ABDOMEN PELVIS FINDINGS Hepatobiliary: No focal liver abnormality is seen. Status post cholecystectomy. No biliary dilatation. Pancreas: Unremarkable. No pancreatic ductal dilatation or surrounding inflammatory changes. Spleen: Normal in size without focal abnormality. Adrenals/Urinary Tract: Adrenal glands are unremarkable. Kidneys are normal, without renal calculi, focal lesion, or hydronephrosis. Bladder is unremarkable. Stomach/Bowel: There is a large hiatal hernia containing the proximal stomach. There is no evidence for volvulus. There is moderate distention of the intrathoracic portion of the stomach with air-fluid level. There is no evidence for bowel obstruction, pneumatosis, inflammation or free air. The appendix is not seen. There is sigmoid colon diverticulosis. Vascular/Lymphatic: Aortic atherosclerosis. No enlarged abdominal or pelvic lymph nodes. Reproductive: Status post hysterectomy. No adnexal masses. Other: No abdominal wall hernia or abnormality. No abdominopelvic ascites. Musculoskeletal: L3-L5 posterior fusion hardware is present. IMPRESSION: 1. Large  hiatal hernia containing the proximal stomach. There is moderate distention of the intrathoracic portion of the stomach with air-fluid level. No evidence for volvulus. 2. 6 mm solid pulmonary nodule. Per Fleischner Society Guidelines, recommend a non-contrast Chest CT at 6-12 months. If patient is high risk for malignancy, consider an additional non-contrast Chest CT at 18-24 months. If patient is low risk for malignancy, non-contrast Chest CT at 18-24 months is optional. These guidelines do not apply to immunocompromised patients and patients with cancer. Follow up in patients with significant comorbidities as clinically warranted. For lung cancer screening, adhere to Lung-RADS guidelines. Reference: Radiology. 2017; 284(1):228-43. Aortic Atherosclerosis (ICD10-I70.0).  Electronically Signed   By: Ronney Asters M.D.   On: 08/03/2022 21:22    Medications / Allergies: per chart  Antibiotics: Anti-infectives (From admission, onward)    None         Note: Portions of this report may have been transcribed using voice recognition software. Every effort was made to ensure accuracy; however, inadvertent computerized transcription errors may be present.   Any transcriptional errors that result from this process are unintentional.    [name], MD, FACS, MASCRS Esophageal, Gastrointestinal & Colorectal Surgery Robotic and Minimally Invasive Surgery  [facility]. [address] [place], [place] [address] [phone] Fax [phone] Main  CONTACT INFORMATION:  Weekday (9AM-5PM): Call [facility] main office at [phone]  Weeknight (5PM-9AM) or Weekend/Holiday: Check [email or web address] (password " [email or web address]") for General Surgery [facility] coverage  (Please, do not use SecureChat as it is not reliable communication to reach operating surgeons for immediate patient care given surgeries/outpatient duties/clinic/cross-coverage/off post-call which would lead to a delay in care.  Epic staff messaging available for outptient concerns, but may not be answered for 48 hours or more).     08/05/2022  12:13 PM

## 2022-08-06 DIAGNOSIS — K440 Diaphragmatic hernia with obstruction, without gangrene: Principal | ICD-10-CM

## 2022-08-06 MED ORDER — ENSURE ENLIVE PO LIQD
237.0000 mL | Freq: Two times a day (BID) | ORAL | Status: DC
  Administered 2022-08-06 – 2022-08-14 (×11): 237 mL via ORAL

## 2022-08-06 MED ORDER — ENSURE MAX PROTEIN PO LIQD
11.0000 [oz_av] | Freq: Every day | ORAL | Status: DC
  Administered 2022-08-06 – 2022-08-15 (×7): 11 [oz_av] via ORAL
  Filled 2022-08-06 (×6): qty 330

## 2022-08-06 NOTE — Plan of Care (Signed)

## 2022-08-06 NOTE — Progress Notes (Signed)
Dr Hochrein's rounding note reviewed, from eval yesterday no additional recs may call with questions. We will sign off inpatient care      Signed, Carlyle Dolly, MD  08/06/2022, 8:02 AM

## 2022-08-06 NOTE — Plan of Care (Signed)
  Problem: Education: Goal: Knowledge of General Education information will improve Description: Including pain rating scale, medication(s)/side effects and non-pharmacologic comfort measures Outcome: Progressing   Problem: Pain Managment: Goal: General experience of comfort will improve Outcome: Progressing   Problem: Safety: Goal: Ability to remain free from injury will improve Outcome: Progressing   

## 2022-08-06 NOTE — Progress Notes (Signed)
Subjective: CC: Stable to slightly improved pain in her epigastrium/luq radiating into her chest. Some nausea but no worse than her baseline. Tolerating cld without worsening nausea or vomiting. Passing flatus. BM yesterday.   Objective: Vital signs in last 24 hours: Temp:  [97.6 F (36.4 C)-98.2 F (36.8 C)] 97.9 F (36.6 C) (03/30 0842) Pulse Rate:  [54-78] 66 (03/30 0842) Resp:  [15-17] 15 (03/30 0842) BP: (122-134)/(66-78) 134/66 (03/30 0842) SpO2:  [94 %-98 %] 95 % (03/30 0842) Last BM Date : 08/05/22  Intake/Output from previous day: No intake/output data recorded. Intake/Output this shift: No intake/output data recorded.  PE: Gen:  Alert, NAD, pleasant Card:  Reg Pulm:  Rate and effort normal Abd: Soft, ND, some upper abdominal ttp without rigidity or guarding, +BS Psych: A&Ox3   Lab Results:  Recent Labs    08/04/22 0230 08/05/22 0501  WBC 2.6* 2.6*  HGB 11.8* 11.7*  HCT 36.9 36.1  PLT 131* 154   BMET Recent Labs    08/04/22 0230 08/05/22 0501  NA 139 139  K 3.2* 3.5  CL 105 108  CO2 27 21*  GLUCOSE 115* 104*  BUN 11 7*  CREATININE 0.84 0.92  CALCIUM 8.8* 9.0   PT/INR No results for input(s): "LABPROT", "INR" in the last 72 hours. CMP     Component Value Date/Time   NA 139 08/05/2022 0501   NA 141 01/03/2022 1454   K 3.5 08/05/2022 0501   CL 108 08/05/2022 0501   CO2 21 (L) 08/05/2022 0501   GLUCOSE 104 (H) 08/05/2022 0501   BUN 7 (L) 08/05/2022 0501   BUN 10 01/03/2022 1454   CREATININE 0.92 08/05/2022 0501   CREATININE 1.01 (H) 06/01/2022 1412   CALCIUM 9.0 08/05/2022 0501   PROT 6.9 08/03/2022 1735   ALBUMIN 3.5 08/03/2022 1735   AST 27 08/03/2022 1735   AST 21 06/01/2022 1412   ALT 20 08/03/2022 1735   ALT 15 06/01/2022 1412   ALKPHOS 146 (H) 08/03/2022 1735   BILITOT 0.3 08/03/2022 1735   BILITOT 0.1 (L) 06/01/2022 1412   GFRNONAA >60 08/05/2022 0501   GFRNONAA >60 06/01/2022 1412   GFRAA 61 03/13/2020 1140   GFRAA  >60 02/25/2019 1438   Lipase  No results found for: "LIPASE"  Studies/Results: ECHOCARDIOGRAM COMPLETE  Result Date: 08/05/2022    ECHOCARDIOGRAM REPORT   Patient Name:   [name] Date of Exam: 08/05/2022 Medical Rec #:  [id number]          Height:       63.0 in Accession #:    [id number]         Weight:       195.0 lb Date of Birth:  08/04/1955           BSA:          1.913 m Patient Age:    67 years           BP:           110/70 mmHg Patient Gender: F                  HR:           55 bpm. Exam Location:  Inpatient Procedure: 2D Echo, Cardiac Doppler and Color Doppler Indications:    Dyspnea R06.00  History:        Patient has prior history of Echocardiogram examinations, most  recent 11/17/2016. Angina and CAD, Arrythmias:Tachycardia and                 Bradycardia; Signs/Symptoms:Chest Pain.  Sonographer:    Ronny Flurry Referring Phys: [id number] [place] [employer]  Sonographer Comments: Image acquisition challenging due to uncooperative patient. IMPRESSIONS  1. Left ventricular ejection fraction, by estimation, is 60 to 65%. The left ventricle has normal function. The left ventricle has no regional wall motion abnormalities. There is mild left ventricular hypertrophy. Left ventricular diastolic parameters were normal.  2. Right ventricular systolic function is normal. The right ventricular size is normal.  3. No evidence of mitral valve regurgitation.  4. The aortic valve is grossly normal. Aortic valve regurgitation is not visualized. Conclusion(s)/Recommendation(s): This is a limited study, patient noted to be uncooperative. FINDINGS  Left Ventricle: Left ventricular ejection fraction, by estimation, is 60 to 65%. The left ventricle has normal function. The left ventricle has no regional wall motion abnormalities. The left ventricular internal cavity size was normal in size. There is  mild left ventricular hypertrophy. Left ventricular diastolic parameters were normal. Right  Ventricle: The right ventricular size is normal. Right ventricular systolic function is normal. Left Atrium: Left atrial size was normal in size. Right Atrium: Right atrial size was normal in size. Pericardium: There is no evidence of pericardial effusion. Mitral Valve: No evidence of mitral valve regurgitation. Tricuspid Valve: Tricuspid valve regurgitation is mild. Aortic Valve: The aortic valve is grossly normal. Aortic valve regurgitation is not visualized. Aortic valve mean gradient measures 5.0 mmHg. Aortic valve peak gradient measures 11.0 mmHg. Aortic valve area, by VTI measures 2.19 cm. Aorta: The aortic root and ascending aorta are structurally normal, with no evidence of dilitation. IAS/Shunts: No atrial level shunt detected by color flow Doppler.  LEFT VENTRICLE PLAX 2D LVIDd:         3.90 cm LVIDs:         2.50 cm LV PW:         1.10 cm LV IVS:        0.90 cm LVOT diam:     2.00 cm LV SV:         81 LV SV Index:   42 LVOT Area:     3.14 cm  LEFT ATRIUM           Index        RIGHT ATRIUM           Index LA diam:      4.60 cm 2.40 cm/m   RA Area:     16.80 cm LA Vol (A4C): 51.1 ml 26.71 ml/m  RA Volume:   41.60 ml  21.75 ml/m  AORTIC VALVE AV Area (Vmax):    1.97 cm AV Area (Vmean):   2.04 cm AV Area (VTI):     2.19 cm AV Vmax:           166.00 cm/s AV Vmean:          111.000 cm/s AV VTI:            0.370 m AV Peak Grad:      11.0 mmHg AV Mean Grad:      5.0 mmHg LVOT Vmax:         104.00 cm/s LVOT Vmean:        72.200 cm/s LVOT VTI:          0.258 m LVOT/AV VTI ratio: 0.70  AORTA Ao Root diam: 3.70 cm Ao Asc diam:  2.60 cm TRICUSPID VALVE TR Peak grad:   18.8 mmHg TR Vmax:        217.00 cm/s  SHUNTS Systemic VTI:  0.26 m Systemic Diam: 2.00 cm [name] Electronically signed by [name] Signature Date/Time: 08/05/2022/10:34:10 AM    Final    DG UGI W SINGLE CM (SOL OR THIN BA)  Result Date: 08/04/2022 CLINICAL DATA:  Patient with known large hiatal hernia and significant nausea and  vomiting. EXAM: DG UGI W SINGLE CM TECHNIQUE: Scout radiograph was obtained. Single contrast examination was performed using thin liquid barium. This exam was performed by Reatha Armour, PA-C , and was supervised and interpreted by Maurine Simmering, MD. A limited study was conducted due to patient mobility and significant nausea during exam. FLUOROSCOPY: Radiation Exposure Index (as provided by the fluoroscopic device): 22.10 mGy Kerma COMPARISON:  CT chest abdomen pelvis with contrast 08/03/2022 FINDINGS: Limited upper GI exam due to patient mobility and significant nausea during the exam. Scout Radiograph: No evidence of bowel obstruction. Prior lumbar fusion. Prior cholecystectomy. Esophagus:  No obvious mucosal abnormality.  Somewhat tortuous. Esophageal motility:  Mild to moderate esophageal dysmotility. Gastroesophageal reflux:  None visualized. Ingested 59mm barium tablet:  Not given. Stomach: Large hiatal hernia as seen on recent CT. No obvious mucosal abnormality on this limited exam. Gastric emptying: Normal.  No evidence of obstruction. Duodenum: The proximal duodenum appears unremarkable. The distal duodenum is not opacified. Other:  None. IMPRESSION: Limited upper GI exam due to patient condition. Large hiatal hernia, as seen on recent CT. No evidence of obstruction. Mild to moderate esophageal dysmotility. Electronically Signed   By: Maurine Simmering M.D.   On: 08/04/2022 11:23    Anti-infectives: Anti-infectives (From admission, onward)    None        Assessment/Plan Persistent dysphagia with chronically incarcerated hiatal hernia - Last EGD 07/01/22 Dr. Therisa Doyne: tortuous lower 1/3 esophagus, 10cm hiatal hernia, patchy mildly erythematous mucosa without bleeding in gastric antrum and body  - CT 3/27 without volvulus - UGI with no evidence of obstruction - Cont PPI.  - Dr. Johney Maine started Steroids x 72 hours on 3/29  - No indication for emergent surgical intervention today. She is HDS and there is no  evidence of volvulus on imaging. She is nauseated but tolerating cld without vomiting. I think it is reasonable to get to advance to FLD + shakes and see if we can get her pain/nausea under control. If she is tolerating FLD + shakes without vomiting and pain well controlled maybe we can get her home without outpatient follow up to discuss definitive repair. If she is unable to tolerate diet advancement, pain not controlled etc then may need to stay in the hospital for something to be done here.  - Appreciate cardiology evaluation and clearance for surgery   FEN - FLD, shakes,  VTE - SCDs, subq heparin  ID - None  HTN HLD GERD Anxiety/depression Fibromyalgia, chronic pain CKD CAD IDA Incidental finding: pulmonary nodule, follow up PCP  I reviewed nursing notes, Consultant (Cards) notes, last 24 h vitals and pain scores, last 48 h intake and output, last 24 h labs and trends, and last 24 h imaging results.   LOS: 3 days    Jillyn Ledger , [facility] 08/06/2022, 10:22 AM Please see Amion for pager number during day hours 7:00am-4:30pm

## 2022-08-07 NOTE — Plan of Care (Signed)
  Problem: Health Behavior/Discharge Planning: Goal: Ability to manage health-related needs will improve Outcome: Progressing   Problem: Clinical Measurements: Goal: Ability to maintain clinical measurements within normal limits will improve Outcome: Progressing Goal: Will remain free from infection Outcome: Progressing   

## 2022-08-07 NOTE — Progress Notes (Addendum)
Subjective: CC: Patient tried supplemental shakes but had worsening postprandial pain and heartburn.  Severe nausea.  Hesitant to advance.  Worried about going home.  Objective: Vital signs in last 24 hours: Temp:  [97.6 F (36.4 C)-98.2 F (36.8 C)] 97.6 F (36.4 C) (03/31 0817) Pulse Rate:  [53-89] 58 (03/31 0817) Resp:  [16-18] 17 (03/31 0817) BP: (112-142)/(64-73) 120/64 (03/31 0817) SpO2:  [92 %-99 %] 93 % (03/31 0817) Last BM Date : 08/05/22  Intake/Output from previous day: 03/30 0701 - 03/31 0700 In: 360 [P.O.:360] Out: 650 [Urine:650] Intake/Output this shift: No intake/output data recorded.  PE: Gen:  Alert, NAD, not toxic or sickly.   Psych: A&Ox3 Anxious but consolable.   Card:  Reg Pulm:  Rate and effort normal Abd: Obese but soft.  Minimally distended.  Mild epigastric discomfort but no peritonitis.     Lab Results:  Recent Labs    08/05/22 0501  WBC 2.6*  HGB 11.7*  HCT 36.1  PLT 154   BMET Recent Labs    08/05/22 0501  NA 139  K 3.5  CL 108  CO2 21*  GLUCOSE 104*  BUN 7*  CREATININE 0.92  CALCIUM 9.0   PT/INR No results for input(s): "LABPROT", "INR" in the last 72 hours. CMP     Component Value Date/Time   NA 139 08/05/2022 0501   NA 141 01/03/2022 1454   K 3.5 08/05/2022 0501   CL 108 08/05/2022 0501   CO2 21 (L) 08/05/2022 0501   GLUCOSE 104 (H) 08/05/2022 0501   BUN 7 (L) 08/05/2022 0501   BUN 10 01/03/2022 1454   CREATININE 0.92 08/05/2022 0501   CREATININE 1.01 (H) 06/01/2022 1412   CALCIUM 9.0 08/05/2022 0501   PROT 6.9 08/03/2022 1735   ALBUMIN 3.5 08/03/2022 1735   AST 27 08/03/2022 1735   AST 21 06/01/2022 1412   ALT 20 08/03/2022 1735   ALT 15 06/01/2022 1412   ALKPHOS 146 (H) 08/03/2022 1735   BILITOT 0.3 08/03/2022 1735   BILITOT 0.1 (L) 06/01/2022 1412   GFRNONAA >60 08/05/2022 0501   GFRNONAA >60 06/01/2022 1412   GFRAA 61 03/13/2020 1140   GFRAA >60 02/25/2019 1438   Lipase  No results  found for: "LIPASE"  Studies/Results: ECHOCARDIOGRAM COMPLETE  Result Date: 08/05/2022    ECHOCARDIOGRAM REPORT   Patient Name:   [name] Date of Exam: 08/05/2022 Medical Rec #:  [id number]          Height:       63.0 in Accession #:    [id number]         Weight:       195.0 lb Date of Birth:  Jul 20, 1955           BSA:          1.913 m Patient Age:    67 years           BP:           110/70 mmHg Patient Gender: F                  HR:           55 bpm. Exam Location:  Inpatient Procedure: 2D Echo, Cardiac Doppler and Color Doppler Indications:    Dyspnea R06.00  History:        Patient has prior history of Echocardiogram examinations, most  recent 11/17/2016. Angina and CAD, Arrythmias:Tachycardia and                 Bradycardia; Signs/Symptoms:Chest Pain.  Sonographer:    Ronny Flurry Referring Phys: [id number] [place] [employer]  Sonographer Comments: Image acquisition challenging due to uncooperative patient. IMPRESSIONS  1. Left ventricular ejection fraction, by estimation, is 60 to 65%. The left ventricle has normal function. The left ventricle has no regional wall motion abnormalities. There is mild left ventricular hypertrophy. Left ventricular diastolic parameters were normal.  2. Right ventricular systolic function is normal. The right ventricular size is normal.  3. No evidence of mitral valve regurgitation.  4. The aortic valve is grossly normal. Aortic valve regurgitation is not visualized. Conclusion(s)/Recommendation(s): This is a limited study, patient noted to be uncooperative. FINDINGS  Left Ventricle: Left ventricular ejection fraction, by estimation, is 60 to 65%. The left ventricle has normal function. The left ventricle has no regional wall motion abnormalities. The left ventricular internal cavity size was normal in size. There is  mild left ventricular hypertrophy. Left ventricular diastolic parameters were normal. Right Ventricle: The right ventricular size is  normal. Right ventricular systolic function is normal. Left Atrium: Left atrial size was normal in size. Right Atrium: Right atrial size was normal in size. Pericardium: There is no evidence of pericardial effusion. Mitral Valve: No evidence of mitral valve regurgitation. Tricuspid Valve: Tricuspid valve regurgitation is mild. Aortic Valve: The aortic valve is grossly normal. Aortic valve regurgitation is not visualized. Aortic valve mean gradient measures 5.0 mmHg. Aortic valve peak gradient measures 11.0 mmHg. Aortic valve area, by VTI measures 2.19 cm. Aorta: The aortic root and ascending aorta are structurally normal, with no evidence of dilitation. IAS/Shunts: No atrial level shunt detected by color flow Doppler.  LEFT VENTRICLE PLAX 2D LVIDd:         3.90 cm LVIDs:         2.50 cm LV PW:         1.10 cm LV IVS:        0.90 cm LVOT diam:     2.00 cm LV SV:         81 LV SV Index:   42 LVOT Area:     3.14 cm  LEFT ATRIUM           Index        RIGHT ATRIUM           Index LA diam:      4.60 cm 2.40 cm/m   RA Area:     16.80 cm LA Vol (A4C): 51.1 ml 26.71 ml/m  RA Volume:   41.60 ml  21.75 ml/m  AORTIC VALVE AV Area (Vmax):    1.97 cm AV Area (Vmean):   2.04 cm AV Area (VTI):     2.19 cm AV Vmax:           166.00 cm/s AV Vmean:          111.000 cm/s AV VTI:            0.370 m AV Peak Grad:      11.0 mmHg AV Mean Grad:      5.0 mmHg LVOT Vmax:         104.00 cm/s LVOT Vmean:        72.200 cm/s LVOT VTI:          0.258 m LVOT/AV VTI ratio: 0.70  AORTA Ao Root diam: 3.70 cm Ao Asc diam:  2.60 cm TRICUSPID VALVE TR Peak grad:   18.8 mmHg TR Vmax:        217.00 cm/s  SHUNTS Systemic VTI:  0.26 m Systemic Diam: 2.00 cm [name] Electronically signed by [name] Signature Date/Time: 08/05/2022/10:34:10 AM    Final     Anti-infectives: Anti-infectives (From admission, onward)    None        Assessment/Plan Persistent dysphagia with chronically incarcerated hiatal hernia - Last EGD 07/01/22 Dr.  Therisa Doyne: tortuous lower 1/3 esophagus, 10cm hiatal hernia, patchy mildly erythematous mucosa without bleeding in gastric antrum and body  - CT 3/27 without volvulus - UGI with no evidence of obstruction - Cont PPI.  - Dr. Johney Maine started Steroids x 72 hours on 3/29  - Appreciate cardiology evaluation and clearance for surgery   I think she has failed p.o. trial with steroids and is going to require hiatal hernia reduction and repair this hospitalization.  No availability tomorrow but Tuesday there is robot time.  I will see if we can set this up 4/2 Tuesday NOON for myself.  Robotic hiatal hernia repair with Bio-A mesh reinforcement and partial toupet fundoplication. The anatomy & physiology of the foregut and anti-reflux mechanism was discussed.  The pathophysiology of hiatal herniation and GERD was discussed.  Natural history risks without surgery was discussed.   The patient's symptoms are not adequately controlled by medicines and other non-operative treatments.  I feel the risks of no intervention will lead to serious problems that outweigh the operative risks; therefore, I recommended surgery to reduce the hiatal hernia out of the chest and fundoplication to rebuild the anti-reflux valve and control reflux better.  Need for a thorough workup to rule out the differential diagnosis and plan treatment was explained.  I explained minimally invasive techniques with possible need for an open approach.  Risks such as bleeding, infection, abscess, leak,injury to other organs, need for repair of tissues / organs, need for further treatment, stroke, heart attack, death, and other risks were discussed.   I noted a good likelihood this will help address the problem.  Goals of post-operative recovery were discussed as well.  Possibility that this will not correct all symptoms was explained.  Post-operative dysphagia, need for short-term liquid & pureed diet, inability to vomit, possibility of reherniation, possible  need for medicines to help control symptoms in addition to surgery were discussed.  We will work to minimize complications.   Educational handouts further explaining the pathology, treatment options, and dysphagia diet was given as well.  Questions were answered.  The patient expresses understanding & wishes to proceed with surgery.     FEN - FLD, shakes,  VTE - SCDs, subq heparin  ID - None  HTN HLD GERD Anxiety/depression Fibromyalgia, chronic pain CKD CAD IDA Incidental finding: pulmonary nodule, follow up PCP  I reviewed nursing notes, Consultant (Cards) notes, last 24 h vitals and pain scores, last 48 h intake and output, last 24 h labs and trends, and last 24 h imaging results.   LOS: 4 days    [facility] 08/07/2022, 9:04 AM Please see Amion for pager number during day hours 7:00am-4:30pm

## 2022-08-08 MED ORDER — SODIUM CHLORIDE 0.9 % IV SOLN
2.0000 g | INTRAVENOUS | Status: AC
  Filled 2022-08-08: qty 2

## 2022-08-08 NOTE — Plan of Care (Signed)

## 2022-08-08 NOTE — Plan of Care (Signed)

## 2022-08-08 NOTE — Progress Notes (Signed)
       Subjective: CC: Stable pain and nausea. Tolerating liquids without vomiting. Passing flatus. BM yesterday. Voiding. Awaiting surgery in am.   Objective: Vital signs in last 24 hours: Temp:  [97.6 F (36.4 C)-98.4 F (36.9 C)] 97.8 F (36.6 C) (04/01 0755) Pulse Rate:  [52-58] 52 (04/01 0755) Resp:  [16-18] 18 (04/01 0755) BP: (121-141)/(66-76) 141/76 (04/01 0755) SpO2:  [94 %-96 %] 95 % (04/01 0755) Last BM Date : 08/08/22  Intake/Output from previous day: No intake/output data recorded. Intake/Output this shift: No intake/output data recorded.  PE: Gen:  Alert, NAD, pleasant Card:  Reg Pulm:  CTAB, no W/R/R, effort normal Abd: Obese, soft, some upper abdominal ttp, no rigidity or guarding, +BS.  Ext:  No LE edema  Psych: A&Ox3   Lab Results:  No results for input(s): "WBC", "HGB", "HCT", "PLT" in the last 72 hours. BMET No results for input(s): "NA", "K", "CL", "CO2", "GLUCOSE", "BUN", "CREATININE", "CALCIUM" in the last 72 hours. PT/INR No results for input(s): "LABPROT", "INR" in the last 72 hours. CMP     Component Value Date/Time   NA 139 08/05/2022 0501   NA 141 01/03/2022 1454   K 3.5 08/05/2022 0501   CL 108 08/05/2022 0501   CO2 21 (L) 08/05/2022 0501   GLUCOSE 104 (H) 08/05/2022 0501   BUN 7 (L) 08/05/2022 0501   BUN 10 01/03/2022 1454   CREATININE 0.92 08/05/2022 0501   CREATININE 1.01 (H) 06/01/2022 1412   CALCIUM 9.0 08/05/2022 0501   PROT 6.9 08/03/2022 1735   ALBUMIN 3.5 08/03/2022 1735   AST 27 08/03/2022 1735   AST 21 06/01/2022 1412   ALT 20 08/03/2022 1735   ALT 15 06/01/2022 1412   ALKPHOS 146 (H) 08/03/2022 1735   BILITOT 0.3 08/03/2022 1735   BILITOT 0.1 (L) 06/01/2022 1412   GFRNONAA >60 08/05/2022 0501   GFRNONAA >60 06/01/2022 1412   GFRAA 61 03/13/2020 1140   GFRAA >60 02/25/2019 1438   Lipase  No results found for: "LIPASE"  Studies/Results: No results found.  Anti-infectives: Anti-infectives (From admission,  onward)    None        Assessment/Plan Persistent dysphagia with chronically incarcerated hiatal hernia - Last EGD 07/01/22 Dr. Therisa Doyne: tortuous lower 1/3 esophagus, 10cm hiatal hernia, patchy mildly erythematous mucosa without bleeding in gastric antrum and body  - CT 3/27 without volvulus - UGI with no evidence of obstruction - Cont PPI.  - Dr. Johney Maine started Steroids x 72 hours on 3/29  - Appreciate cardiology evaluation and clearance for surgery  - Pateint failed non-op management. Plan OR with Dr. Johney Maine on 4/2.   FEN - CLD. NPO at 4am per orders VTE - SCDs, subq heparin ID - On call to OR  I reviewed nursing notes, last 24 h vitals and pain scores, last 48 h intake and output, last 24 h labs and trends, and last 24 h imaging results.    LOS: 5 days    Jillyn Ledger , [facility] 08/08/2022, 10:42 AM Please see Amion for pager number during day hours 7:00am-4:30pm

## 2022-08-09 ENCOUNTER — Other Ambulatory Visit: Payer: Self-pay

## 2022-08-09 ENCOUNTER — Inpatient Hospital Stay (HOSPITAL_COMMUNITY): Payer: Medicare Other | Admitting: Anesthesiology

## 2022-08-09 ENCOUNTER — Encounter (HOSPITAL_COMMUNITY): Admission: AD | Disposition: A | Discharge status: Home / Self Care | Payer: Self-pay | Source: Ambulatory Visit

## 2022-08-09 ENCOUNTER — Encounter (HOSPITAL_COMMUNITY): Payer: Self-pay | Admitting: Surgery

## 2022-08-09 DIAGNOSIS — K440 Diaphragmatic hernia with obstruction, without gangrene: Secondary | ICD-10-CM

## 2022-08-09 DIAGNOSIS — I10 Essential (primary) hypertension: Secondary | ICD-10-CM

## 2022-08-09 DIAGNOSIS — N289 Disorder of kidney and ureter, unspecified: Secondary | ICD-10-CM | POA: Diagnosis not present

## 2022-08-09 HISTORY — PX: INSERTION OF MESH: SHX5868

## 2022-08-09 HISTORY — PX: XI ROBOTIC ASSISTED HIATAL HERNIA REPAIR: SHX6889

## 2022-08-09 SURGERY — REPAIR, HERNIA, HIATAL, ROBOT-ASSISTED
Anesthesia: General | Site: Abdomen

## 2022-08-09 MED ORDER — ONDANSETRON HCL 4 MG/2ML IJ SOLN
INTRAMUSCULAR | Status: AC
  Filled 2022-08-09: qty 2

## 2022-08-09 MED ORDER — ENSURE PRE-SURGERY PO LIQD
592.0000 mL | Freq: Once | ORAL | Status: DC
  Filled 2022-08-09: qty 592

## 2022-08-09 MED ORDER — BUPIVACAINE-EPINEPHRINE (PF) 0.25% -1:200000 IJ SOLN
INTRAMUSCULAR | Status: AC
  Filled 2022-08-09: qty 30

## 2022-08-09 MED ORDER — SODIUM CHLORIDE 0.9 % IV SOLN
2.0000 g | INTRAVENOUS | Status: AC
  Administered 2022-08-09: 2 g via INTRAVENOUS
  Filled 2022-08-09: qty 20

## 2022-08-09 MED ORDER — LIDOCAINE 2% (20 MG/ML) 5 ML SYRINGE
INTRAMUSCULAR | Status: AC
  Filled 2022-08-09: qty 5

## 2022-08-09 MED ORDER — METOCLOPRAMIDE HCL 10 MG PO TABS: 10.0000 mg | ORAL_TABLET | Freq: Four times a day (QID) | ORAL | 5 refills | Status: DC | PRN

## 2022-08-09 MED ORDER — GABAPENTIN 300 MG PO CAPS
300.0000 mg | ORAL_CAPSULE | ORAL | Status: AC
  Administered 2022-08-09: 300 mg via ORAL
  Filled 2022-08-09: qty 1

## 2022-08-09 MED ORDER — MIDAZOLAM HCL 2 MG/2ML IJ SOLN
INTRAMUSCULAR | Status: AC
  Filled 2022-08-09: qty 2

## 2022-08-09 MED ORDER — DEXAMETHASONE SODIUM PHOSPHATE 10 MG/ML IJ SOLN
INTRAMUSCULAR | Status: AC
  Filled 2022-08-09: qty 1

## 2022-08-09 MED ORDER — PHENYLEPHRINE HCL-NACL 20-0.9 MG/250ML-% IV SOLN
INTRAVENOUS | Status: DC | PRN
  Administered 2022-08-09: 70 ug/min via INTRAVENOUS
  Administered 2022-08-09: 40 ug/min via INTRAVENOUS

## 2022-08-09 MED ORDER — SUGAMMADEX SODIUM 200 MG/2ML IV SOLN
INTRAVENOUS | Status: DC | PRN
  Administered 2022-08-09: 200 mg via INTRAVENOUS

## 2022-08-09 MED ORDER — HYDROMORPHONE HCL 1 MG/ML IJ SOLN
0.2500 mg | INTRAMUSCULAR | Status: DC | PRN
  Administered 2022-08-09 (×3): 0.25 mg via INTRAVENOUS

## 2022-08-09 MED ORDER — DROPERIDOL 2.5 MG/ML IJ SOLN
INTRAMUSCULAR | Status: AC
  Filled 2022-08-09: qty 2

## 2022-08-09 MED ORDER — OXYCODONE HCL 5 MG/5ML PO SOLN: 5.0000 mg | Freq: Once | ORAL | Status: DC | PRN

## 2022-08-09 MED ORDER — LIDOCAINE 2% (20 MG/ML) 5 ML SYRINGE
INTRAMUSCULAR | Status: DC | PRN
  Administered 2022-08-09: 100 mg via INTRAVENOUS

## 2022-08-09 MED ORDER — GLYCOPYRROLATE 0.2 MG/ML IJ SOLN
INTRAMUSCULAR | Status: DC | PRN
  Administered 2022-08-09: .2 mg via INTRAVENOUS

## 2022-08-09 MED ORDER — ROCURONIUM BROMIDE 10 MG/ML (PF) SYRINGE
PREFILLED_SYRINGE | INTRAVENOUS | Status: DC | PRN
  Administered 2022-08-09: 30 mg via INTRAVENOUS
  Administered 2022-08-09: 40 mg via INTRAVENOUS
  Administered 2022-08-09: 20 mg via INTRAVENOUS

## 2022-08-09 MED ORDER — 0.9 % SODIUM CHLORIDE (POUR BTL) OPTIME
TOPICAL | Status: DC | PRN
  Administered 2022-08-09: 1000 mL

## 2022-08-09 MED ORDER — SCOPOLAMINE 1 MG/3DAYS TD PT72
MEDICATED_PATCH | TRANSDERMAL | Status: DC | PRN
  Administered 2022-08-09: 1 via TRANSDERMAL

## 2022-08-09 MED ORDER — CHLORHEXIDINE GLUCONATE CLOTH 2 % EX PADS: 6.0000 | MEDICATED_PAD | Freq: Once | CUTANEOUS | Status: AC

## 2022-08-09 MED ORDER — SUCCINYLCHOLINE CHLORIDE 200 MG/10ML IV SOSY
PREFILLED_SYRINGE | INTRAVENOUS | Status: DC | PRN
  Administered 2022-08-09: 10 mg via INTRAVENOUS
  Administered 2022-08-09: 160 mg via INTRAVENOUS

## 2022-08-09 MED ORDER — PHENYLEPHRINE 80 MCG/ML (10ML) SYRINGE FOR IV PUSH (FOR BLOOD PRESSURE SUPPORT)
PREFILLED_SYRINGE | INTRAVENOUS | Status: DC | PRN
  Administered 2022-08-09 (×3): 160 ug via INTRAVENOUS
  Administered 2022-08-09: 80 ug via INTRAVENOUS

## 2022-08-09 MED ORDER — EPHEDRINE SULFATE-NACL 50-0.9 MG/10ML-% IV SOSY
PREFILLED_SYRINGE | INTRAVENOUS | Status: DC | PRN
  Administered 2022-08-09: 10 mg via INTRAVENOUS
  Administered 2022-08-09: 5 mg via INTRAVENOUS

## 2022-08-09 MED ORDER — BUPIVACAINE LIPOSOME 1.3 % IJ SUSP: 20.0000 mL | Freq: Once | INTRAMUSCULAR | Status: DC

## 2022-08-09 MED ORDER — ROCURONIUM BROMIDE 10 MG/ML (PF) SYRINGE
PREFILLED_SYRINGE | INTRAVENOUS | Status: AC
  Filled 2022-08-09: qty 10

## 2022-08-09 MED ORDER — PROPOFOL 10 MG/ML IV BOLUS
INTRAVENOUS | Status: DC | PRN
  Administered 2022-08-09: 140 mg via INTRAVENOUS

## 2022-08-09 MED ORDER — ENSURE PRE-SURGERY PO LIQD
296.0000 mL | Freq: Once | ORAL | Status: AC
  Filled 2022-08-09: qty 296

## 2022-08-09 MED ORDER — SCOPOLAMINE 1 MG/3DAYS TD PT72
MEDICATED_PATCH | TRANSDERMAL | Status: AC
  Filled 2022-08-09: qty 1

## 2022-08-09 MED ORDER — ORAL CARE MOUTH RINSE: 15.0000 mL | Freq: Once | OROMUCOSAL | Status: AC

## 2022-08-09 MED ORDER — FENTANYL CITRATE (PF) 250 MCG/5ML IJ SOLN
INTRAMUSCULAR | Status: DC | PRN
  Administered 2022-08-09: 100 ug via INTRAVENOUS
  Administered 2022-08-09: 50 ug via INTRAVENOUS

## 2022-08-09 MED ORDER — CHLORHEXIDINE GLUCONATE 0.12 % MT SOLN
15.0000 mL | Freq: Once | OROMUCOSAL | Status: AC
  Administered 2022-08-09: 15 mL via OROMUCOSAL
  Filled 2022-08-09: qty 15

## 2022-08-09 MED ORDER — DEXAMETHASONE SODIUM PHOSPHATE 10 MG/ML IJ SOLN: 4.0000 mg | INTRAMUSCULAR | Status: DC

## 2022-08-09 MED ORDER — HYDROMORPHONE HCL 1 MG/ML IJ SOLN
INTRAMUSCULAR | Status: AC
  Filled 2022-08-09: qty 1

## 2022-08-09 MED ORDER — ALBUMIN HUMAN 5 % IV SOLN: INTRAVENOUS | Status: DC | PRN

## 2022-08-09 MED ORDER — BUPIVACAINE-EPINEPHRINE (PF) 0.25% -1:200000 IJ SOLN
INTRAMUSCULAR | Status: AC
  Filled 2022-08-09: qty 60

## 2022-08-09 MED ORDER — BUPIVACAINE-EPINEPHRINE (PF) 0.25% -1:200000 IJ SOLN
INTRAMUSCULAR | Status: DC | PRN
  Administered 2022-08-09: 38 mL

## 2022-08-09 MED ORDER — LACTATED RINGERS IV SOLN: INTRAVENOUS | Status: DC

## 2022-08-09 MED ORDER — DROPERIDOL 2.5 MG/ML IJ SOLN
INTRAMUSCULAR | Status: DC | PRN
  Administered 2022-08-09: .625 mg via INTRAVENOUS

## 2022-08-09 MED ORDER — FENTANYL CITRATE (PF) 250 MCG/5ML IJ SOLN
INTRAMUSCULAR | Status: AC
  Filled 2022-08-09: qty 5

## 2022-08-09 MED ORDER — MIDAZOLAM HCL 2 MG/2ML IJ SOLN
INTRAMUSCULAR | Status: DC | PRN
  Administered 2022-08-09: 2 mg via INTRAVENOUS

## 2022-08-09 MED ORDER — BUPIVACAINE LIPOSOME 1.3 % IJ SUSP
INTRAMUSCULAR | Status: AC
  Filled 2022-08-09: qty 20

## 2022-08-09 MED ORDER — PROPOFOL 10 MG/ML IV BOLUS
INTRAVENOUS | Status: AC
  Filled 2022-08-09: qty 20

## 2022-08-09 MED ORDER — OXYCODONE HCL 5 MG PO TABS: 5.0000 mg | ORAL_TABLET | Freq: Once | ORAL | Status: DC | PRN

## 2022-08-09 MED ORDER — ACETAMINOPHEN 500 MG PO TABS
1000.0000 mg | ORAL_TABLET | ORAL | Status: AC
  Administered 2022-08-09: 1000 mg via ORAL
  Filled 2022-08-09: qty 2

## 2022-08-09 MED ORDER — KETOROLAC TROMETHAMINE 30 MG/ML IJ SOLN: 30.0000 mg | Freq: Once | INTRAMUSCULAR | Status: DC | PRN

## 2022-08-09 SURGICAL SUPPLY — 73 items
ADH SKN CLS APL DERMABOND .7 (GAUZE/BANDAGES/DRESSINGS) ×1
APL PRP STRL LF DISP 70% ISPRP (MISCELLANEOUS) ×1
APPLIER CLIP 5 13 M/L LIGAMAX5 (MISCELLANEOUS)
APR CLP MED LRG 5 ANG JAW (MISCELLANEOUS)
BIOPATCH RED 1 DISK 7.0 (GAUZE/BANDAGES/DRESSINGS) IMPLANT
CANNULA REDUC XI 12-8 STAPL (CANNULA) ×1
CANNULA REDUCER 12-8 DVNC XI (CANNULA) ×2 IMPLANT
CHLORAPREP W/TINT 26 (MISCELLANEOUS) ×2 IMPLANT
CLIP APPLIE 5 13 M/L LIGAMAX5 (MISCELLANEOUS) IMPLANT
COVER MAYO STAND STRL (DRAPES) ×2 IMPLANT
COVER SURGICAL LIGHT HANDLE (MISCELLANEOUS) ×2 IMPLANT
COVER TIP SHEARS 8 DVNC (MISCELLANEOUS) IMPLANT
COVER TIP SHEARS 8MM DA VINCI (MISCELLANEOUS)
DEFOGGER SCOPE WARMER CLEARIFY (MISCELLANEOUS) ×2 IMPLANT
DERMABOND ADVANCED .7 DNX12 (GAUZE/BANDAGES/DRESSINGS) ×2 IMPLANT
DEVICE TROCAR PUNCTURE CLOSURE (ENDOMECHANICALS) ×2 IMPLANT
DRAIN CHANNEL 19F RND (DRAIN) IMPLANT
DRAPE ARM DVNC X/XI (DISPOSABLE) ×8 IMPLANT
DRAPE CARDIOVASC SPLIT 88X140 (DRAPES) ×2 IMPLANT
DRAPE COLUMN DVNC XI (DISPOSABLE) ×2 IMPLANT
DRAPE DA VINCI XI ARM (DISPOSABLE) ×4
DRAPE DA VINCI XI COLUMN (DISPOSABLE) ×1
DRAPE ORTHO SPLIT 77X108 STRL (DRAPES) ×1
DRAPE SURG ORHT 6 SPLT 77X108 (DRAPES) ×2 IMPLANT
DRSG TEGADERM 2-3/8X2-3/4 SM (GAUZE/BANDAGES/DRESSINGS) IMPLANT
DRSG TEGADERM 4X4.75 (GAUZE/BANDAGES/DRESSINGS) IMPLANT
ELECT REM PT RETURN 9FT ADLT (ELECTROSURGICAL) ×1
ELECTRODE REM PT RTRN 9FT ADLT (ELECTROSURGICAL) ×2 IMPLANT
EVACUATOR SILICONE 100CC (DRAIN) IMPLANT
FELT TEFLON 1X6 (MISCELLANEOUS) IMPLANT
GAUZE SPONGE 2X2 STRL 8-PLY (GAUZE/BANDAGES/DRESSINGS) IMPLANT
GLOVE ECLIPSE 8.0 STRL XLNG CF (GLOVE) IMPLANT
GLOVE INDICATOR 8.0 STRL GRN (GLOVE) IMPLANT
GOWN STRL REUS W/ TWL LRG LVL3 (GOWN DISPOSABLE) ×2 IMPLANT
GOWN STRL REUS W/ TWL XL LVL3 (GOWN DISPOSABLE) ×4 IMPLANT
GOWN STRL REUS W/TWL 2XL LVL3 (GOWN DISPOSABLE) ×2 IMPLANT
GOWN STRL REUS W/TWL LRG LVL3 (GOWN DISPOSABLE) ×1
GOWN STRL REUS W/TWL XL LVL3 (GOWN DISPOSABLE) ×2
IRRIG SUCT STRYKERFLOW 2 WTIP (MISCELLANEOUS) ×1
IRRIGATION SUCT STRKRFLW 2 WTP (MISCELLANEOUS) ×2 IMPLANT
KIT BASIN OR (CUSTOM PROCEDURE TRAY) ×2 IMPLANT
KIT TURNOVER KIT B (KITS) IMPLANT
MARKER SKIN DUAL TIP RULER LAB (MISCELLANEOUS) ×2 IMPLANT
MESH BIO-A 7X10 SYN MAT (Mesh General) ×2 IMPLANT
NDL 22X1.5 STRL (OR ONLY) (MISCELLANEOUS) ×2 IMPLANT
NDL INSUFFLATION 14GA 120MM (NEEDLE) ×2 IMPLANT
NEEDLE 22X1.5 STRL (OR ONLY) (MISCELLANEOUS) ×1 IMPLANT
NEEDLE INSUFFLATION 14GA 120MM (NEEDLE) ×1 IMPLANT
OBTURATOR OPTICAL STANDARD 8MM (TROCAR)
OBTURATOR OPTICAL STND 8 DVNC (TROCAR)
OBTURATOR OPTICALSTD 8 DVNC (TROCAR) IMPLANT
SCISSORS LAP 5X35 DISP (ENDOMECHANICALS) IMPLANT
SEAL CANN UNIV 5-8 DVNC XI (MISCELLANEOUS) ×6 IMPLANT
SEAL XI 5MM-8MM UNIVERSAL (MISCELLANEOUS) ×3
SEALER VESSEL DA VINCI XI (MISCELLANEOUS) ×1
SEALER VESSEL EXT DVNC XI (MISCELLANEOUS) ×2 IMPLANT
SET TUBE SMOKE EVAC HIGH FLOW (TUBING) ×2 IMPLANT
STAPLER CANNULA SEAL DVNC XI (STAPLE) ×2 IMPLANT
STAPLER CANNULA SEAL XI (STAPLE) ×1
STOPCOCK 4 WAY LG BORE MALE ST (IV SETS) ×2 IMPLANT
SUT ETHIBOND 0 36 GRN (SUTURE) ×4 IMPLANT
SUT ETHIBOND 2 0 SH (SUTURE)
SUT ETHIBOND 2 0 SH 36X2 (SUTURE) IMPLANT
SUT ETHIBOND NAB CT1 #1 30IN (SUTURE) IMPLANT
SUT MNCRL AB 4-0 PS2 18 (SUTURE) ×2 IMPLANT
SUT PROLENE 2 0 FS (SUTURE) IMPLANT
SUT SILK 0 SH 30 (SUTURE) ×2 IMPLANT
SUT VICRYL 0 UR6 27IN ABS (SUTURE) ×2 IMPLANT
SUT VLOC 180 2-0 6IN GS21 (SUTURE) IMPLANT
SUT VLOC 180 2-0 9IN GS21 (SUTURE) IMPLANT
TRAY FOLEY MTR SLVR 16FR STAT (SET/KITS/TRAYS/PACK) ×2 IMPLANT
TRAY LAPAROSCOPIC MC (CUSTOM PROCEDURE TRAY) ×2 IMPLANT
TROCAR ADV FIXATION 5X100MM (TROCAR) ×2 IMPLANT

## 2022-08-09 NOTE — Anesthesia Procedure Notes (Addendum)
Procedure Name: Intubation Date/Time: 08/09/2022 12:42 PM  Performed by: Anastasio Auerbach, CRNAPre-anesthesia Checklist: Patient identified, Emergency Drugs available, Suction available and Patient being monitored Patient Re-evaluated:Patient Re-evaluated prior to induction Oxygen Delivery Method: Circle system utilized Preoxygenation: Pre-oxygenation with 100% oxygen Induction Type: IV induction, Rapid sequence and Cricoid Pressure applied Laryngoscope Size: Mac and 3 Grade View: Grade I Tube type: Oral Tube size: 7.0 mm Number of attempts: 1 Airway Equipment and Method: Stylet Placement Confirmation: ETT inserted through vocal cords under direct vision, positive ETCO2 and breath sounds checked- equal and bilateral Secured at: 21 cm Tube secured with: Tape Dental Injury: Teeth and Oropharynx as per pre-operative assessment

## 2022-08-09 NOTE — Transfer of Care (Signed)
Immediate Anesthesia Transfer of Care Note  Patient: [name]  Procedure(s) Performed: XI ROBOTIC ASSISTED HIATAL HERNIA REPAIR WITH PARTIAL TOUPET FUNDOPLICATION (Abdomen) INSERTION OF MESH (Abdomen)  Patient Location: PACU  Anesthesia Type:General  Level of Consciousness: drowsy and patient cooperative  Airway & Oxygen Therapy: Patient connected to face mask oxygen  Post-op Assessment: Report given to RN and Post -op Vital signs reviewed and stable  Post vital signs: Reviewed and stable  Last Vitals:  Vitals Value Taken Time  BP 131/64 08/09/22 1530  Temp    Pulse 83 08/09/22 1533  Resp 25 08/09/22 1533  SpO2 90 % 08/09/22 1533  Vitals shown include unvalidated device data.  Last Pain:  Vitals:   08/09/22 1046  TempSrc:   PainSc: 8       Patients Stated Pain Goal: 1 ([id number])  Complications: No notable events documented.

## 2022-08-09 NOTE — Op Note (Signed)
08/09/2022  3:31 PM  PATIENT:  [name]  67 y.o. 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 ROBOTIC reduction of paraesophageal hiatal hernia 2. Type II mediastinal dissection. 3. Primary repair of hiatal hernia over pledgets.  4. Anterior & posterior gastropexy. 5. Toupet (270 degree partial posterior x 4cm)  fundoplication 6. Mesh reinforcement with absorbable mesh  SURGEON:  [name], MD  ASSISTANT:  .Pryor Curia, RNFA An experienced assistant was required given the standard of surgical care given the complexity of the case.  This assistant was needed for exposure, dissection, suction, tissue approximation, retraction, perception, etc  ANESTHESIA:  General endotracheal intubation anesthesia (GETA) and Local & regional field block at incision(s) for perioperative & postoperative pain control provided with 106mL of bupivicaine 0.25% with epinephrine  Estimated Blood Loss (EBL):   Total I/O In: 1600 [I.V.:1000; IV Piggyback:600] Out: - .   (See anesthesia record)  Delay start of Pharmacological VTE agent (>24hrs) due to concerns of significant anemia, surgical blood loss, or risk of bleeding?:  no  DRAINS: (None) and 19 Fr Blake drain with tip resting in the mediastinum  SPECIMEN:  Hernia sac (not sent)  DISPOSITION OF SPECIMEN:  Pathology and (not applicable)  COUNTS:  Sponge, needle, & instrument counts CORRECT  PLAN OF CARE: Admit to inpatient   PATIENT DISPOSITION:  PACU - hemodynamically stable.  INDICATION:    Patient with symptomatic paraesophageal hiatal hernia.  The patient has had extensive work-up & we feel the patient will benefit from repair:  The anatomy & physiology of the foregut and anti-reflux mechanism was discussed.  The pathophysiology of hiatal herniation and GERD was discussed.  Natural history risks without surgery was discussed.   The patient's symptoms are not adequately controlled by medicines and other non-operative treatments.  I feel the risks of no intervention will lead to serious problems that outweigh the operative risks; therefore, I recommended surgery to reduce the hiatal hernia out of the chest and fundoplication to rebuild the anti-reflux valve and control reflux better.  Need for a thorough workup to rule out the differential diagnosis and plan treatment was explained.  I explained laparoscopic techniques with possible need for an open approach.  Risks such as bleeding, infection, abscess, leak, need for further treatment, heart attack, death, and other risks were discussed.   I noted a good likelihood this will help address the problem.  Goals of post-operative recovery were discussed as well.  Possibility that this will not correct all symptoms was explained.  Post-operative dysphagia, need for short-term liquid & pureed diet, inability to vomit, possibility of reherniation, possible need for medicines to help control symptoms in addition to surgery were discussed.  We will work to minimize complications.   Educational handouts further explaining the pathology, treatment options, and dysphagia diet was given as well.  Questions were answered.  The patient expresses understanding & wishes to proceed with surgery.  OR FINDINGS:   Moderate-sized paraesophageal hiatal hernia with 50% of the stomach in the mediastinum.  There was a 11 x 9 cm hiatal defect.  It is a primary repair over pledgets.  Mesh reinforcement  was used with GORE BIO-A mesh, a biosynthetic web scaffold  made of [id number] polyglycolic acid (PGA): A999333 trimethylene carbonate ([place]).  The patient has a Toupet (270 degree partial posterior x 4cm)  fundoplication.  The patient has had anterior and posterior gastropexy.  DESCRIPTION:   Informed consent was confirmed.  The patient received IV antibiotics prior to incision.  The underwent general anesthesia without difficulty.  A Foley catheter sterilely placed.  The patient was positioned in split leg with arms tucked. The abdomen was prepped and draped in the sterile fashion.  Surgical time-out confirmed our plan.  I placed a 5 mm port in the left subcostal region using Varess entry technique with the patient in steep reverse Trendelenburg and left side up.  Entry was clean.  We induced carbon dioxide insufflation.  Camera inspection revealed no injury.  Under direct visualization, I placed extra ports.  I also placed a 5 mm port in the left subxiphoid region under direct visualization.  I removed that and placed an Omega-shaped rigid Nathanson liver retractor to lift the left lateral sector of the liver anteriorly to expose the esophageal hiatus.  This was secured to the bed using the iron man system.  The Xi robot was carefully docked and instruments placed and advanced under direct visualization.  We focused on dissection.  We grasped the anterior mediastinal sac at the apex of the crus.  I scored through that and got into the anterior mediastinum.  I was able to free the mediastinal sac from its attachments to the pericardium and bilateral pleura using primarily focused gentle blunt dissection as well as focused vessel sealer dissection.  I transected phrenoesophageal attachments to the inner right crus, preserving a two centimeter cuff of mediastinal sac until I found the base of the crura.  I then came around anteriorly on the left side and freed up the phrenoesophageal attachments of the mediastinal sac on the medial part of the left crus on the superior half.   I did careful mediastinal dissection to free the mediastinal sac.  With that, we could relieve the suction cup affect of the hernia sac and help reduce the stomach back down into the abdomen, flipped back approriately.    We ligated the short gastrics along the lesser curvature of the stomach about a third the way down and then came up proximally over the fundus.  We released the attachments of the stomach to the retroperitoneum until we were able to connect with the prior dissection on the left crus.  We completed the release of phrenoesophageal attachments to the medial part of the left crus down to its base.  It was quite apparent that the stomach was somewhat partially twisted in the organoaxial axis like a corkscrew.  This seemed to help explain her partial obstructive symptoms.  Eventually freed those off the retroperitoneum and especially along the gastrosplenic region.  With this, we had circumferential mobilization.    We placed the stomach and esophagus on axial tension.  I then did a Type II mediastinal dissection where I freed the esophagus from its attachments to the aorta, spine, pleura, and pericardium using primarily gentle blunt as well as focused ultrasonic dissection.  We saw the anterior & posterior vagus nerves intact.  We preserved it at all times.  I procedded to dissect about 25 cm proximally into the mediastinum.  I ended up opening up the left pleura in the sagittal plane left and right to get some release of the pleura  and relaxation.  I allowed the lungs to recent flight better as well.  Turned the intra-abdominal insufflation pressure to 10 mm.  Patient had a brief episode of mild hypoxia into the low 90s but then stabilized to the mid to high 90s for the rest of the case.  Focused again on mediastinal dissection.  With that I could straighten out the esophagus and get 5 cm of intra-abdominal length of the esophagus at a best estimation.  I freed the anterior mediastinal sac off  the esophagus & stomach.  We saw the anterior vagus nerve and freed the sac off of the vagus.  I dissected out & removed the fatty  epiphrenic pads at the esophagogastric junction. With that, I could better define the esophagogastric junction.  I confirmed the the patient had 5 cm of intra-abdominal esophageal length off tension.  The hiatal defect was somewhat broad.  Therefore I did a partial release of the left diaphragm in the sagittal plane 5 cm lateral to the left crus posterior laterally to get 3 cm of relaxation. I did a similar recent on the right anterior crus to get 2 cm of relaxation.  This did occur came together much better..    I brought the fundus of the stomach posterior to the esophagus over to the right side.  The wrap was mobile with the classic shoe shine maneuver.  Wrap became together gently.  We reflected the stomach left laterally and closed the esophageal hiatus using #1 Ethibond stitch using horizontal mattress stitches with pledgets on both sides.  I did that x2 stitches.  I then did a third horizontal mattress #1 Ethibond sutures without pledgets on the most anterior closest to the esophagus.  That allowed a good closure with enough relaxation to allow a large blunt grasper to easily pass in the mediastinum.  The crura had good substance and they came together well without any tension.  Because of the larger defect, I reinforced the repair using a Bio-A 10x7 cm biosynthetic precut mesh. We brought the mesh in and laid it over the crural repair, tails anterior over the crura.  I tucked the broader "U" tail of the mesh between the left diaphragm and the spleen, the narrower "U" tail over the right crus.  I secured to the left lateral and left superior sides of the broader "U" tail to the left diaphragm band with 2-0 V lock running suture.  Secured the narrow towel to the right crus using a running 2-0 V-lock suture as well.  I took care to have muscle crura along the edge and help took  the muscle Cura in the mesh together such that no pledget was exposed to the stomach or esophagus.  I brought the fundus of the stomach behind the esophagus and cardia to set up a fundoplication wrap.  I did a posterior gastropexy x3  by taking of #1 Ethibond interrupted stitches to the posterior part of the right side of the wrap to the apex of the hiatal closure muscle occur only.  Then 2 more stitches through the posterior wrap more posteriorly interrupted fashion including thru the mesh and crural closure.  I did a left and right anterior gastropexy using the anterolateral crura to the anterolateral stomach wrap only.  Did not include esophagus lives with this.  With that the stomach completely covered over the mesh and pledgets and protected the esophagus from any exposure to any crural closure stitches/mesh/pledgets. That way the stomach covered the mesh and  protected it from any esophageal exposure.  With the anterior and posterior gastropexy's, stomach laid well for a fundoplication wrap.  I then did a classic 4cm Toupet fundoplication on the true esophagus above the cardia using 0 Ethibond stitch in the left superior side of the wrap and tied that down..  Did a mirror-image stitch on the right side to do a right anterior gastropexy.  I then did 2 more distal pairs of suture between the inner part of the wrap and the anterolateral esophagus.  That way there were 3 total pairs of fundoplication sutures offering a posterior 270 degree wrap.  Measured at 4 cm.  Classic Toupet fundoplication.    The wrap was soft and floppy.  I placed a drain in a 12 m right upper quadrant port into the mediastinum anterior to the esophagus without resistance.  I did irrigation and ensured hemostasis.  I saw no evidence of any leak or perforation or other abnormality.  I removed the [facility] liver retractor under direct visualization.  I evacuated carbon dioxide and removed the ports.  The skin was closed with Monocryl  and sterile dressings applied.  The patient is being extubated and brought back to the recovery room.  I discussed postop care in detail with the patient and family on floor twice.  Discussed again with the patient and her significant other in the holding area.  I discussed operative findings, updated the patient's status, discussed probable steps to recovery, and gave postoperative recommendations to the patient's significant other, [name] Younts .  Recommendations were made.  Questions were answered.  He expressed understanding & appreciation.   [name], M.D., F.A.C.S. Gastrointestinal and Minimally Invasive Surgery [facility] [address], [place], [place] [address] [phone] Main / Paging

## 2022-08-09 NOTE — Interval H&P Note (Signed)
History and Physical Interval Note:  08/09/2022 8:31 AM  [name]  has presented today for surgery, with the diagnosis of Incarcerated Hiatal hernia.  The various methods of treatment have been discussed with the patient and family. After consideration of risks, benefits and other options for treatment, the patient has consented to  Procedure(s): XI ROBOTIC Three Way EGD (N/A) as a surgical intervention.  The patient's history has been reviewed, patient examined, no change in status, stable for surgery.  I have reviewed the patient's chart and labs.  Questions were answered to the patient's satisfaction.    I have re-reviewed the the patient's records, history, medications, and allergies.  I have re-examined the patient.  I again discussed intraoperative plans and goals of post-operative recovery.  The patient agrees to proceed.  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   Coronary artery disease involving native coronary artery of native heart with angina pectoris    Pure hypercholesterolemia    Abnormal stress test    Chest pain with moderate risk for cardiac etiology    Prolonged QT interval    Bradycardia    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    Past Medical History:  Diagnosis Date   Anemia    Anxiety    Arthritis    "all over" (11/16/2016)   CAD  (coronary artery disease)    Chronic lower back pain    Chronic pain syndrome    "all over" (11/16/2016)   CKD (chronic kidney disease), stage III (HCC)    Depression    Fibromyalgia    GERD (gastroesophageal reflux disease)    History of blood transfusion    "related to hand laceration"   History of hiatal hernia    History of stomach ulcers    "bled when I was younger" (11/16/2016)   Hypercholesterolemia    Hypertension    hx; "they took me off my RX" (11/16/2016)   Migraine 1970s   Phlebitis of leg, right    "years ago" (11/16/2016)   Radiculopathy     Past Surgical History:  Procedure Laterality Date   ABDOMINAL HYSTERECTOMY  1993   w/left ovary   ANKLE FRACTURE SURGERY Bilateral    [place]  2005   "negative"   CHOLECYSTECTOMY OPEN  1977   ESOPHAGOGASTRODUODENOSCOPY  2008   dx'd GERD   FOREARM FRACTURE SURGERY Right    HIP SURGERY Bilateral    "took out bone spurs"   JOINT REPLACEMENT     KNEE ARTHROSCOPY Bilateral    "total of 18 times"   LACERATION REPAIR Right    hand; "went thru glass door"   LEFT HEART CATH AND CORONARY ANGIOGRAPHY N/A 11/18/2016   Procedure: Left Heart Cath and Coronary Angiography;  Surgeon: Kelli Bush, MD;  Location: [facility];  Service: Cardiovascular;  Laterality: N/A;   LEFT HEART CATH AND CORONARY ANGIOGRAPHY N/A 03/16/2020  Procedure: LEFT HEART CATH AND CORONARY ANGIOGRAPHY;  Surgeon: Kelli Bush, MD;  Location: [facility];  Service: Cardiovascular;  Laterality: N/A;   LUMBAR FUSION     "put rods in"   LUMBAR LAMINECTOMY/DECOMPRESSION MICRODISCECTOMY  05/2006   OPEN REDUCTION INTERNAL FIXATION (ORIF) DISTAL RADIAL FRACTURE Left 06/18/2018   Procedure: OPEN REDUCTION INTERNAL FIXATION (ORIF) DISTAL RADIAL FRACTURE repair and/or reconstruction;  Surgeon: Kelli Planas, MD;  Location: [facility];  Service: Orthopedics;  Laterality: Left;   REVISION TOTAL KNEE  ARTHROPLASTY Bilateral    RIGHT OOPHORECTOMY Right    SHOULDER OPEN ROTATOR CUFF REPAIR Bilateral    left 04/2006   TONSILLECTOMY     TOTAL KNEE ARTHROPLASTY Bilateral    left 1998; right 2004   TUBAL LIGATION      Social History   Socioeconomic History   Marital status: Single    Spouse name: Not on file   Number of children: 0   Years of education: college   Highest education level: Not on file  Occupational History    Comment: retired  Tobacco Use   Smoking status: Never   Smokeless tobacco: Never  Vaping Use   Vaping Use: Never used  Substance and Sexual Activity   Alcohol use: Yes    Comment: occas   Drug use: No   Sexual activity: Yes  Other Topics Concern   Not on file  Social History Narrative   Lives with [name].     Education: college.     Children none.     Retired and Disabled.     Caffeine Drinks tea.   Social Determinants of Health   Financial Resource Strain: Not on file  Food Insecurity: No Food Insecurity (08/03/2022)   Hunger Vital Sign    Worried About Running Out of Food in the Last Year: Never true    Ran Out of Food in the Last Year: Never true  Transportation Needs: No Transportation Needs (08/03/2022)   PRAPARE - Hydrologist (Medical): No    Lack of Transportation (Non-Medical): No  Physical Activity: Not on file  Stress: Not on file  Social Connections: Not on file  Intimate Partner Violence: Not At Risk (08/04/2022)   Humiliation, Afraid, Rape, and Kick questionnaire    Fear of Current or Ex-Partner: No    Emotionally Abused: No    Physically Abused: No    Sexually Abused: No    Family History  Problem Relation Age of Onset   COPD Mother    Emphysema Mother    CAD Father 54   Lung cancer Father    Heart disease Father    Other Sister        TRAP syndrome   Diabetes Sister        boarderline   Iron deficiency Sister     Facility-Administered Medications Prior to Admission  Medication  Dose Route Frequency Provider Last Rate Last Admin   sodium chloride flush (NS) 0.9 % injection 3 mL  3 mL Intravenous Q12H [name]., NP       Medications Prior to Admission  Medication Sig Dispense Refill Last Dose   Acetaminophen-Caffeine (EXCEDRIN ASPIRIN FREE PO) Take 1-2 tablets by mouth daily as needed (pain, headache).   Past Month   ALPRAZolam (XANAX) 1 MG tablet Take 1 tablet by mouth 3 (three) times daily as needed for anxiety.   Past Week   baclofen (LIORESAL) 10 MG tablet Take 10 mg  by mouth 3 (three) times daily as needed for muscle spasms.   Past Week   DULoxetine (CYMBALTA) 20 MG capsule Take 20 mg by mouth 2 (two) times daily.   Past Week   famotidine (PEPCID) 20 MG tablet Take 20 mg by mouth 2 (two) times daily.    Past Week   gabapentin (NEURONTIN) 300 MG capsule Take 300 mg by mouth 3 (three) times daily.   Past Week   HYDROcodone-acetaminophen (NORCO) 10-325 MG tablet Take 1 tablet by mouth 4 (four) times daily as needed (pain).   Past Week   lisinopril (ZESTRIL) 5 MG tablet Take 5 mg by mouth daily.   Past Week   meloxicam (MOBIC) 15 MG tablet Take 15 mg by mouth daily.    Past Week   nitroGLYCERIN (NITROSTAT) 0.4 MG SL tablet Place 1 tablet (0.4 mg total) under the tongue every 5 (five) minutes x 3 doses as needed for chest pain. 25 tablet 2 NEVER   omeprazole (PRILOSEC) 20 MG capsule Take 20 mg by mouth 2 (two) times daily.   Past Week   potassium chloride (MICRO-K) 10 MEQ CR capsule Take 10 mEq by mouth 2 (two) times daily.   Past Week   promethazine (PHENERGAN) 25 MG tablet Take 12.5-25 mg by mouth every 8 (eight) hours as needed for nausea or vomiting.   08/03/2022   rosuvastatin (CRESTOR) 20 MG tablet TAKE 1 TABLET BY MOUTH ONCE DAILY . APPOINTMENT REQUIRED FOR FUTURE REFILLS (Patient taking differently: Take 20 mg by mouth daily.) 30 tablet 0 Past Week   tamsulosin (FLOMAX) 0.4 MG CAPS capsule Take 0.4 mg by mouth daily.   Past Week   amLODipine (NORVASC) 5 MG  tablet Take 1 tablet (5 mg total) by mouth daily. (Patient not taking: Reported on 08/04/2022) 90 tablet 3 Not Taking    Current Facility-Administered Medications  Medication Dose Route Frequency Provider Last Rate Last Admin   0.9 %  sodium chloride infusion  250 mL Intravenous PRN [name], MD       albuterol (PROVENTIL) (2.5 MG/3ML) 0.083% nebulizer solution 3 mL  3 mL Inhalation Q4H PRN Meuth, Brooke A, PA-C       ALPRAZolam Duanne Moron) tablet 1 mg  1 mg Oral BID Meuth, Brooke A, PA-C   1 mg at 08/08/22 2134   alum & mag hydroxide-simeth (MAALOX/MYLANTA) 200-200-20 MG/5ML suspension 30 mL  30 mL Oral Q6H PRN [name], MD       amLODipine (NORVASC) tablet 5 mg  5 mg Oral Daily Meuth, Brooke A, PA-C   5 mg at 08/08/22 0831   baclofen (LIORESAL) tablet 10 mg  10 mg Oral TID PRN Margie Billet A, PA-C   10 mg at 08/07/22 1412   bisacodyl (DULCOLAX) suppository 10 mg  10 mg Rectal Daily [name], MD       cefoTEtan (CEFOTAN) 2 g in sodium chloride 0.9 % 100 mL IVPB  2 g Intravenous On Call to [place], Barth Kirks, PA-C       DULoxetine (CYMBALTA) DR capsule 20 mg  20 mg Oral BID Meuth, Brooke A, PA-C   20 mg at 08/08/22 2135   feeding supplement (ENSURE ENLIVE / ENSURE PLUS) liquid 237 mL  237 mL Oral BID BM Jillyn Ledger, PA-C   237 mL at 08/08/22 1531   gabapentin (NEURONTIN) capsule 300 mg  300 mg Oral TID Meuth, Brooke A, PA-C   300 mg at 08/08/22 2134   heparin injection 5,000  Units  5,000 Units Subcutaneous Q8H Johnathan Hausen, MD   5,000 Units at 08/08/22 2134   HYDROcodone-acetaminophen (NORCO) 10-325 MG per tablet 1 tablet  1 tablet Oral QID PRN Wellington Hampshire, PA-C   1 tablet at 08/08/22 2134   HYDROmorphone (DILAUDID) injection 0.5 mg  0.5 mg Intravenous Q3H PRN Stechschulte, Nickola Major, MD   0.5 mg at 08/07/22 2113   lip balm (CARMEX) ointment   Topical BID [name], MD   Given at 08/08/22 2140   lisinopril (ZESTRIL) tablet 10 mg  10 mg Oral Daily Meuth, Brooke A, PA-C    10 mg at 08/08/22 0830   magic mouthwash  15 mL Oral QID PRN [name], MD       menthol-cetylpyridinium (CEPACOL) lozenge 3 mg  1 lozenge Oral PRN [name], MD       methocarbamol (ROBAXIN) 1,000 mg in dextrose 5 % 100 mL IVPB  1,000 mg Intravenous Q6H PRN [name], MD       metoCLOPramide (REGLAN) injection 5-10 mg  5-10 mg Intravenous Q8H PRN [name], MD       metoprolol tartrate (LOPRESSOR) injection 5 mg  5 mg Intravenous Q6H PRN Johnathan Hausen, MD       ondansetron (ZOFRAN-ODT) disintegrating tablet 4 mg  4 mg Oral Q6H PRN Johnathan Hausen, MD   4 mg at 08/07/22 1412   Or   ondansetron (ZOFRAN) injection 4 mg  4 mg Intravenous Q6H PRN Johnathan Hausen, MD   4 mg at 08/09/22 0817   pantoprazole (PROTONIX) injection 40 mg  40 mg Intravenous Jolyn Nap, MD   40 mg at 08/08/22 2134   phenol (CHLORASEPTIC) mouth spray 2 spray  2 spray Mouth/Throat PRN [name], MD       polycarbophil (FIBERCON) tablet 625 mg  625 mg Oral BID [name], MD   625 mg at 08/08/22 2135   prochlorperazine (COMPAZINE) injection 5-10 mg  5-10 mg Intravenous Q4H PRN [name], MD       protein supplement (ENSURE MAX) liquid  11 oz Oral Daily Jillyn Ledger, PA-C   11 oz at 08/08/22 1200   sodium chloride flush (NS) 0.9 % injection 3 mL  3 mL Intravenous Gorden Harms, MD   3 mL at 08/08/22 2140   sodium chloride flush (NS) 0.9 % injection 3 mL  3 mL Intravenous PRN [name], MD       tiZANidine (ZANAFLEX) tablet 4 mg  4 mg Oral BID PRN Meuth, Brooke A, PA-C   4 mg at 08/08/22 2143     Allergies  Allergen Reactions   Asa [Aspirin] Nausea Only    Stomach upset   Ditropan [Oxybutynin] Rash   Shellfish Allergy Swelling and Rash    BP 113/67 (BP Location: Left Arm)   Pulse (!) 54 Comment: notified nurse -DH  Temp 97.6 F (36.4 C) (Oral)   Resp 15   Ht 5\' 3"  (1.6 m)   Wt 88.5 kg   SpO2 98%   BMI 34.54 kg/m   Labs: No results found for this or any previous  visit (from the past 48 hour(s)).  Imaging / Studies: ECHOCARDIOGRAM COMPLETE  Result Date: 08/05/2022    ECHOCARDIOGRAM REPORT   Patient Name:   SHOKO MCGOWAN Date of Exam: 08/05/2022 Medical Rec #:  [id number]          Height:       63.0 in Accession #:    [id number]  Weight:       195.0 lb Date of Birth:  11/30/1955           BSA:          1.913 m Patient Age:    35 years           BP:           110/70 mmHg Patient Gender: F                  HR:           55 bpm. Exam Location:  Inpatient Procedure: 2D Echo, Cardiac Doppler and Color Doppler Indications:    Dyspnea R06.00  History:        Patient has prior history of Echocardiogram examinations, most                 recent 11/17/2016. Angina and CAD, Arrythmias:Tachycardia and                 Bradycardia; Signs/Symptoms:Chest Pain.  Sonographer:    Ronny Flurry Referring Phys: [id number] [place] [facility]  Sonographer Comments: Image acquisition challenging due to uncooperative patient. IMPRESSIONS  1. Left ventricular ejection fraction, by estimation, is 60 to 65%. The left ventricle has normal function. The left ventricle has no regional wall motion abnormalities. There is mild left ventricular hypertrophy. Left ventricular diastolic parameters were normal.  2. Right ventricular systolic function is normal. The right ventricular size is normal.  3. No evidence of mitral valve regurgitation.  4. The aortic valve is grossly normal. Aortic valve regurgitation is not visualized. Conclusion(s)/Recommendation(s): This is a limited study, patient noted to be uncooperative. FINDINGS  Left Ventricle: Left ventricular ejection fraction, by estimation, is 60 to 65%. The left ventricle has normal function. The left ventricle has no regional wall motion abnormalities. The left ventricular internal cavity size was normal in size. There is  mild left ventricular hypertrophy. Left ventricular diastolic parameters were normal. Right Ventricle: The right  ventricular size is normal. Right ventricular systolic function is normal. Left Atrium: Left atrial size was normal in size. Right Atrium: Right atrial size was normal in size. Pericardium: There is no evidence of pericardial effusion. Mitral Valve: No evidence of mitral valve regurgitation. Tricuspid Valve: Tricuspid valve regurgitation is mild. Aortic Valve: The aortic valve is grossly normal. Aortic valve regurgitation is not visualized. Aortic valve mean gradient measures 5.0 mmHg. Aortic valve peak gradient measures 11.0 mmHg. Aortic valve area, by VTI measures 2.19 cm. Aorta: The aortic root and ascending aorta are structurally normal, with no evidence of dilitation. IAS/Shunts: No atrial level shunt detected by color flow Doppler.  LEFT VENTRICLE PLAX 2D LVIDd:         3.90 cm LVIDs:         2.50 cm LV PW:         1.10 cm LV IVS:        0.90 cm LVOT diam:     2.00 cm LV SV:         81 LV SV Index:   42 LVOT Area:     3.14 cm  LEFT ATRIUM           Index        RIGHT ATRIUM           Index LA diam:      4.60 cm 2.40 cm/m   RA Area:     16.80 cm LA Vol (A4C): 51.1 ml 26.71 ml/m  RA Volume:   41.60 ml  21.75 ml/m  AORTIC VALVE AV Area (Vmax):    1.97 cm AV Area (Vmean):   2.04 cm AV Area (VTI):     2.19 cm AV Vmax:           166.00 cm/s AV Vmean:          111.000 cm/s AV VTI:            0.370 m AV Peak Grad:      11.0 mmHg AV Mean Grad:      5.0 mmHg LVOT Vmax:         104.00 cm/s LVOT Vmean:        72.200 cm/s LVOT VTI:          0.258 m LVOT/AV VTI ratio: 0.70  AORTA Ao Root diam: 3.70 cm Ao Asc diam:  2.60 cm TRICUSPID VALVE TR Peak grad:   18.8 mmHg TR Vmax:        217.00 cm/s  SHUNTS Systemic VTI:  0.26 m Systemic Diam: 2.00 cm [name] Electronically signed by [name] Signature Date/Time: 08/05/2022/10:34:10 AM    Final    DG UGI W SINGLE CM (SOL OR THIN BA)  Result Date: 08/04/2022 CLINICAL DATA:  Patient with known large hiatal hernia and significant nausea and vomiting. EXAM: DG UGI W  SINGLE CM TECHNIQUE: Scout radiograph was obtained. Single contrast examination was performed using thin liquid barium. This exam was performed by Reatha Armour, PA-C , and was supervised and interpreted by Maurine Simmering, MD. A limited study was conducted due to patient mobility and significant nausea during exam. FLUOROSCOPY: Radiation Exposure Index (as provided by the fluoroscopic device): 22.10 mGy Kerma COMPARISON:  CT chest abdomen pelvis with contrast 08/03/2022 FINDINGS: Limited upper GI exam due to patient mobility and significant nausea during the exam. Scout Radiograph: No evidence of bowel obstruction. Prior lumbar fusion. Prior cholecystectomy. Esophagus:  No obvious mucosal abnormality.  Somewhat tortuous. Esophageal motility:  Mild to moderate esophageal dysmotility. Gastroesophageal reflux:  None visualized. Ingested 35mm barium tablet:  Not given. Stomach: Large hiatal hernia as seen on recent CT. No obvious mucosal abnormality on this limited exam. Gastric emptying: Normal.  No evidence of obstruction. Duodenum: The proximal duodenum appears unremarkable. The distal duodenum is not opacified. Other:  None. IMPRESSION: Limited upper GI exam due to patient condition. Large hiatal hernia, as seen on recent CT. No evidence of obstruction. Mild to moderate esophageal dysmotility. Electronically Signed   By: Maurine Simmering M.D.   On: 08/04/2022 11:23   DG Chest 2 View  Result Date: 08/04/2022 CLINICAL DATA:  Hiatal hernia and gastric volvulus. EXAM: CHEST - 2 VIEW COMPARISON:  Chest CT 08/03/2022.  Chest radiograph fall 1223. FINDINGS: Unchanged linear atelectasis in the lung bases. Unchanged volume loss in the right upper lobe with associated distortion of the right hilar contours. No new airspace opacity. Stable cardiac and mediastinal contours with large hiatal hernia. No pleural effusion or pneumothorax. IMPRESSION: 1.  No evidence of acute cardiopulmonary disease. 2. Unchanged large hiatal hernia.  Electronically Signed   By: Emmit Alexanders M.D.   On: 08/04/2022 08:23   CT CHEST ABDOMEN PELVIS W CONTRAST  Result Date: 08/03/2022 CLINICAL DATA:  Hiatal hernia with possible gastric volvulus. EXAM: CT CHEST, ABDOMEN, AND PELVIS WITH CONTRAST TECHNIQUE: Multidetector CT imaging of the chest, abdomen and pelvis was performed following the standard protocol during bolus administration of intravenous contrast. RADIATION DOSE REDUCTION: This exam was performed  according to the departmental dose-optimization program which includes automated exposure control, adjustment of the mA and/or kV according to patient size and/or use of iterative reconstruction technique. CONTRAST:  64mL OMNIPAQUE IOHEXOL 350 MG/ML SOLN COMPARISON:  CT PE protocol 04/19/2022 FINDINGS: CT CHEST FINDINGS Cardiovascular: No significant vascular findings. Normal heart size. No pericardial effusion. There are atherosclerotic calcifications of the aorta and coronary arteries. Mediastinum/Nodes: No enlarged mediastinal, hilar, or axillary lymph nodes. Thyroid gland, trachea, and esophagus demonstrate no significant findings. Large hiatal hernia again seen containing the proximal stomach. Lungs/Pleura: There is a 6 mm nodule in the right upper lobe image 5/37. There are atelectatic changes in the lung bases. There is a band of atelectasis in the left upper lobe. There is no pleural effusion or pneumothorax. Trachea and central airways are patent. Musculoskeletal: There are healed right-sided rib fractures. Degenerative changes affect the spine. CT ABDOMEN PELVIS FINDINGS Hepatobiliary: No focal liver abnormality is seen. Status post cholecystectomy. No biliary dilatation. Pancreas: Unremarkable. No pancreatic ductal dilatation or surrounding inflammatory changes. Spleen: Normal in size without focal abnormality. Adrenals/Urinary Tract: Adrenal glands are unremarkable. Kidneys are normal, without renal calculi, focal lesion, or hydronephrosis.  Bladder is unremarkable. Stomach/Bowel: There is a large hiatal hernia containing the proximal stomach. There is no evidence for volvulus. There is moderate distention of the intrathoracic portion of the stomach with air-fluid level. There is no evidence for bowel obstruction, pneumatosis, inflammation or free air. The appendix is not seen. There is sigmoid colon diverticulosis. Vascular/Lymphatic: Aortic atherosclerosis. No enlarged abdominal or pelvic lymph nodes. Reproductive: Status post hysterectomy. No adnexal masses. Other: No abdominal wall hernia or abnormality. No abdominopelvic ascites. Musculoskeletal: L3-L5 posterior fusion hardware is present. IMPRESSION: 1. Large hiatal hernia containing the proximal stomach. There is moderate distention of the intrathoracic portion of the stomach with air-fluid level. No evidence for volvulus. 2. 6 mm solid pulmonary nodule. Per Fleischner Society Guidelines, recommend a non-contrast Chest CT at 6-12 months. If patient is high risk for malignancy, consider an additional non-contrast Chest CT at 18-24 months. If patient is [name] risk for malignancy, non-contrast Chest CT at 18-24 months is optional. These guidelines do not apply to immunocompromised patients and patients with cancer. Follow up in patients with significant comorbidities as clinically warranted. For lung cancer screening, adhere to Lung-RADS guidelines. Reference: Radiology. 2017; 284(1):228-43. Aortic Atherosclerosis (ICD10-I70.0). Electronically Signed   By: Ronney Asters M.D.   On: 08/03/2022 21:22     .[name], M.D., F.A.C.S. Gastrointestinal and Minimally Invasive Surgery [facility] [address] [place], [place] [address] [phone] Main / Paging  08/09/2022 8:31 AM    [name]

## 2022-08-09 NOTE — Discharge Instructions (Addendum)
EATING AFTER YOUR ESOPHAGEAL SURGERY (Stomach Fundoplication, Hiatal Hernia repair, Achalasia surgery, etc)  ######################################################################  EAT Start with a pureed / full liquid diet (see below) Gradually transition to a high fiber diet with a fiber supplement over the next month after discharge.    WALK Walk an hour a day.  Control your pain to do that.    CONTROL PAIN Control pain so that you can walk, sleep, tolerate sneezing/coughing, go up/down stairs.  HAVE A BOWEL MOVEMENT DAILY Keep your bowels regular to avoid problems.  OK to try a laxative to override constipation.  OK to use an antidairrheal to slow down diarrhea.  Call if not better after 2 tries  CALL IF YOU HAVE PROBLEMS/CONCERNS Call if you are still struggling despite following these instructions. Call if you have concerns not answered by these instructions  ######################################################################   After your esophageal surgery, expect some sticking with swallowing over the next 1-2 months.    If food sticks when you eat, it is called "dysphagia".  This is due to swelling around your esophagus at the wrap & hiatal diaphragm repair.  It will gradually ease off over the next few months.  To help you through this temporary phase, we start you out on a pureed (blenderized) diet.  Your first meal in the hospital was thin liquids.  You should have been given a pureed diet by the time you left the hospital.  We ask patients to stay on a pureed diet for the first 2-3 weeks to avoid anything getting "stuck" near your recent surgery.  Don't be alarmed if your ability to swallow doesn't progress according to this plan.  Everyone is different and some diets can advance more or less quickly.    It is often helpful to crush your medications or split them as they can sometimes stick, especially the first week or so.   Some BASIC RULES to follow  are:  Maintain an upright position whenever eating or drinking.  Take small bites - just a teaspoon size bite at a time.  Eat slowly.  It may also help to eat only one food at a time.  Consider nibbling through smaller, more frequent meals & avoid the urge to eat BIG meals  Do not push through feelings of fullness, nausea, or bloatedness  Do not mix solid foods and liquids in the same mouthful  Try not to "wash foods down" with large gulps of liquids.  Avoid carbonated (bubbly/fizzy) drinks.    Avoid foods that make you feel gassy or bloated.  Start with bland foods first.  Wait on trying greasy, fried, or spicy meals until you are tolerating more bland solids well.  Understand that it will be hard to burp and belch at first.  This gradually improves with time.  Expect to be more gassy/flatulent/bloated initially.  Walking will help your body manage it better.  Consider using medications for bloating that contain simethicone such as  Maalox or Gas-X   Consider crushing her medications, especially smaller pills.  The ability to swallow pills should get easier after a few weeks  Eat in a relaxed atmosphere & minimize distractions.  Avoid talking while eating.    Do not use straws.  Following each meal, sit in an upright position (90 degree angle) for 60 to 90 minutes.  Going for a short walk can help as well  If food does stick, don't panic.  Try to relax and let the food pass on its own.    Be gradual in changes & use common sense:  -If you easily tolerating a certain "level" of foods, advance to the next level gradually -If you are having trouble swallowing a particular food, then avoid it.   -If food is sticking when you advance your diet, go back to thinner previous diet (the lower LEVEL) for 1-2 days.  LEVEL 1 = PUREED DIET  Do for the first 2 WEEKS AFTER SURGERY  -Foods in this group are pureed or blenderized to a smooth, mashed  potato-like consistency.  -If necessary, the pureed foods can keep their shape with the addition of a thickening agent.   -Meat should be pureed to a smooth, pasty consistency.  Hot broth or gravy may be added to the pureed meat, approximately 1 oz. of liquid per 3 oz. serving of meat. -CAUTION:  If any foods do not puree into a smooth consistency, swallowing will be more difficult.  (For example, nuts or seeds sometimes do not blend well.)  Hot Foods Cold Foods  Pureed scrambled eggs and cheese Pureed cottage cheese  Baby cereals Thickened juices and nectars  Thinned cooked cereals (no lumps) Thickened milk or eggnog  Pureed [place] toast or pancakes Ensure  Mashed potatoes Ice cream  Pureed parsley, au gratin, scalloped potatoes, candied sweet potatoes Fruit or [place] ice, sherbet  Pureed buttered or alfredo noodles Plain yogurt  Pureed vegetables (no corn or peas) Instant breakfast  Pureed soups and creamed soups Smooth pudding, mousse, custard  Pureed scalloped apples Whipped gelatin  Gravies Sugar, syrup, honey, jelly  Sauces, cheese, tomato, barbecue, white, creamed Cream  Any baby food Creamer  Alcohol in moderation (not beer or champagne) Margarine  Coffee or tea Mayonnaise   Ketchup, mustard   Apple sauce   SAMPLE MENU:  PUREED DIET Breakfast Lunch Dinner  Orange juice, 1/2 cup Cream of wheat, 1/2 cup Pineapple juice, 1/2 cup Pureed [place], barley soup, 3/4 cup Pureed Hawaiian chicken, 3 oz  Scrambled eggs, mashed or blended with cheese, 1/2 cup Tea or coffee, 1 cup  Whole milk, 1 cup  Non-dairy creamer, 2 Tbsp. Mashed potatoes, 1/2 cup Pureed cooled broccoli, 1/2 cup Apple sauce, 1/2 cup Coffee or tea Mashed potatoes, 1/2 cup Pureed spinach, 1/2 cup Frozen yogurt, 1/2 cup Tea or coffee      LEVEL 2 = SOFT DIET  After your first 2 weeks, you can advance to a soft diet.   Keep on this diet until everything goes down easily.  Hot Foods Cold Foods  White fish  Cottage cheese  Stuffed fish Junior baby fruit  Baby food meals Semi thickened juices  Minced soft cooked, scrambled, poached eggs nectars  Souffle & omelets Ripe mashed bananas  Cooked cereals Canned fruit, pineapple sauce, milk  potatoes Milkshake  Buttered or Alfredo noodles Custard  Cooked cooled vegetable Puddings, including tapioca  Sherbet Yogurt  Vegetable soup or alphabet soup Fruit ice, [place] ice  Gravies Whipped gelatin  Sugar, syrup, honey, jelly Junior baby desserts  Sauces:  Cheese, creamed, barbecue, tomato, white Cream  Coffee or tea Margarine   SAMPLE MENU:  LEVEL 2 Breakfast Lunch Dinner  Orange juice, 1/2 cup Oatmeal, 1/2 cup Scrambled eggs with cheese, 1/2 cup Decaffeinated tea, 1 cup Whole milk, 1 cup Non-dairy creamer, 2 Tbsp Pineapple juice, 1/2 cup Minced beef, 3 oz Gravy, 2 Tbsp Mashed potatoes, 1/2 cup Minced fresh broccoli, 1/2 cup Applesauce, 1/2 cup Coffee, 1 cup [place], barley soup, 3/4 cup Minced Hawaiian chicken, 3 oz  Non-dairy creamer, 2 Tbsp  Pineapple juice, 1/2 cup  Minced beef, 3 oz  Gravy, 2 Tbsp  Mashed potatoes, 1/2 cup  Minced fresh broccoli, 1/2 cup  Applesauce, 1/2 cup  Coffee, 1 cup  [place], barley soup, 3/4 cup  Minced Hawaiian chicken, 3 oz  Mashed potatoes, 1/2 cup  Cooked spinach, 1/2 cup  Frozen yogurt, 1/2 cup  Non-dairy creamer, 2 Tbsp      LEVEL 3 = CHOPPED DIET  -After all the foods in level 2 (soft diet) are passing through well you should advance up to more chopped foods.  -It is still important to cut these foods into small pieces and eat slowly.  Hot Foods Cold Foods  Poultry Cottage cheese  Chopped Swedish meatballs Yogurt  Meat salads (ground or flaked meat) Milk  Flaked fish (tuna) Milkshakes  Poached or scrambled eggs Soft, cold, dry cereal  Souffles and omelets Fruit juices or nectars  Cooked cereals Chopped canned fruit  Chopped French toast or pancakes Canned fruit cocktail  Noodles or pasta (no rice) Pudding, mousse, custard  Cooked vegetables (no frozen peas, corn, or mixed vegetables) Green salad  Canned small sweet peas  Ice cream  Creamed soup or vegetable soup Fruit ice, Italian ice  Pureed vegetable soup or alphabet soup Non-dairy creamer  Ground scalloped apples Margarine  Gravies Mayonnaise  Sauces:  Cheese, creamed, barbecue, tomato, white Ketchup  Coffee or tea Mustard   SAMPLE MENU:  LEVEL 3 Breakfast Lunch Dinner   Orange juice, 1/2 cup  Oatmeal, 1/2 cup  Scrambled eggs with cheese, 1/2 cup  Decaffeinated tea, 1 cup  Whole milk, 1 cup  Non-dairy creamer, 2 Tbsp  Ketchup, 1 Tbsp  Margarine, 1 tsp  Salt, 1/4 tsp  Sugar, 2 tsp  Pineapple juice, 1/2 cup  Ground beef, 3 oz  Gravy, 2 Tbsp  Mashed potatoes, 1/2 cup  Cooked spinach, 1/2 cup  Applesauce, 1/2 cup  Decaffeinated coffee  Whole milk  Non-dairy creamer, 2 Tbsp  Margarine, 1 tsp  Salt, 1/4 tsp  Pureed turkey, barley soup, 3/4 cup  Barbecue chicken, 3 oz  Mashed potatoes, 1/2 cup  Ground fresh broccoli, 1/2 cup  Frozen yogurt, 1/2 cup  Decaffeinated tea, 1 cup  Non-dairy creamer, 2 Tbsp  Margarine, 1 tsp  Salt, 1/4 tsp  Sugar, 1 tsp    LEVEL 4:  REGULAR FOODS  -Foods in this group are soft, moist, regularly textured foods.   -This level includes meat and breads, which tend to be the hardest things to swallow.   -Eat very slowly, chew well and continue to avoid carbonated drinks. -most people are at this level in 4-6 weeks  Hot Foods Cold Foods  Baked fish or skinned Soft cheeses - cottage cheese  Souffles and omelets Cream cheese  Eggs Yogurt  Stuffed shells Milk  Spaghetti with meat sauce Milkshakes  Cooked cereal Cold dry cereals (no nuts, dried fruit, coconut)  French toast or pancakes Crackers  Buttered toast Fruit juices or nectars  Noodles or pasta (no rice) Canned fruit  Potatoes (all types) Ripe bananas  Soft, cooked vegetables (no corn, lima, or baked beans) Peeled, ripe, fresh fruit  Creamed soups or vegetable soup Cakes (no nuts, dried fruit, coconut)  Canned chicken  noodle soup Plain doughnuts  Gravies Ice cream  Bacon dressing Pudding, mousse, custard  Sauces:  Cheese, creamed, barbecue, tomato, white Fruit ice, Italian ice, sherbet  Decaffeinated tea or coffee Whipped gelatin  Pork chops Regular gelatin     as needed  6) May hold gluten/wheat products from diet to see if symptoms improve.  7) May try probiotics (Align, Activa, etc) to help calm the bowels down  7) If symptoms become worse call back immediately.    If you have any questions please call our office at [facility]: [phone].   ################################################################  LAPAROSCOPIC SURGERY: POST OP INSTRUCTIONS  ######################################################################  EAT Start with a pureed / full liquid diet (see below)  WALK Walk an hour a day.  Control your pain to do that.    CONTROL PAIN Control pain so that you can walk, sleep, tolerate sneezing/coughing, go up/down stairs.  HAVE A BOWEL MOVEMENT DAILY Keep your bowels regular to avoid problems.  OK to try a laxative to override constipation.  OK to use an antidairrheal to slow down diarrhea.  Call if not better after 2 tries  CALL IF YOU HAVE PROBLEMS/CONCERNS Call if you are still struggling despite following these instructions. Call if you have concerns not answered by these instructions  ######################################################################    DIET: See esophageal surgery instructions as above  Take your usually prescribed home medications unless otherwise directed.  PAIN CONTROL: Pain is best controlled by a usual combination of three different methods  TOGETHER: Ice/Heat Over the counter pain medication Prescription pain medication Most patients will experience some swelling and bruising around the incisions.  Ice packs or heating pads (30-60 minutes up to 6 times a day) will help. Use ice for the first few days to help decrease swelling and bruising, then switch to heat to help relax tight/sore spots and speed recovery.  Some people prefer to use ice alone, heat alone, alternating between ice & heat.  Experiment to what works for you.  Swelling and bruising can take several weeks to resolve.   It is helpful to take an over-the-counter pain medication regularly for the first few weeks.  Choose one of the following that works best for you: Naproxen (Aleve, etc)  Two 220mg  tabs twice a day Ibuprofen (Advil, etc) Three 200mg  tabs four times a day (every meal & bedtime) Acetaminophen (Tylenol, etc) 500-650mg  four times a day (every meal & bedtime) A  prescription for pain medication (such as oxycodone, hydrocodone, tramadol, gabapentin, methocarbamol, etc) should be given to you upon discharge.  Take your pain medication as prescribed.  If you are having problems/concerns with the prescription medicine (does not control pain, nausea, vomiting, rash, itching, etc), please call us [phone] to see if we need to switch you to a different pain medicine that will work better for you and/or control your side effect better. If you need a refill on your pain medication, please give [place] 48 hour notice.  contact your pharmacy.  They will contact our office to request authorization. Prescriptions will not be filled after 5 pm or on week-ends  AVOID GETTING CONSTIPATED.   a.  Between the surgery and the pain medications, it is common to experience some constipation.  b.  Drink plenty of liquids c   ake a fiber supplement 2 times day (such as Metamucil, Citrucel, FiberCon, MiraLax, etc) to have a bowel movement every day. d.  If you have not had a BM by 2 days  after surgery: -drink liquids only until you have a bowel movement - take MiraLAX 2 doses every 2 hours until you have a bowel movement   Watch out for diarrhea.   If you have many loose bowel movements, simplify your diet to bland foods &  liquids for a few days.   Stop any stool softeners and decrease your fiber supplement.   Switching to mild anti-diarrheal medications (Kayopectate, Pepto Bismol) can help.   If this worsens or does not improve, please call us.  Wash / shower every day.  You may shower over the dressings as they are waterproof.  Continue to shower over incision(s) after the dressing is off.  It is good for closed incisions and even open wounds to be washed every day.  Shower every day.  Short baths are fine.  Wash the incisions and wounds clean with soap & water.    You may leave closed incisions open to air if it is dry.   You may cover the incision with clean gauze & replace it after your daily shower for comfort.  TEGADERM:  You have clear gauze band-aid dressings over your closed incision(s).  Remove the dressings 3 days after surgery.    ACTIVITIES as tolerated:   You may resume regular (light) daily activities beginning the next day--such as daily self-care, walking, climbing stairs--gradually increasing activities as tolerated.  If you can walk 30 minutes without difficulty, it is safe to try more intense activity such as jogging, treadmill, bicycling, low-impact aerobics, swimming, etc. Save the most intensive and strenuous activity for last such as sit-ups, heavy lifting, contact sports, etc  Refrain from any heavy lifting or straining until you are off narcotics for pain control.   DO NOT PUSH THROUGH PAIN.  Let pain be your guide: If it hurts to do something, don't do it.  Pain is your body warning you to avoid that activity for another week until the pain goes down. You may drive when you are no longer taking prescription pain medication, you can comfortably wear  a seatbelt, and you can safely maneuver your car and apply brakes. You may have sexual intercourse when it is comfortable.  FOLLOW UP in our office Please call [facility] at [phone] to set up an appointment to see your surgeon in the office for a follow-up appointment approximately 2-3 weeks after your surgery. Make sure that you call for this appointment the day you arrive home to insure a convenient appointment time.  10. IF YOU HAVE DISABILITY OR FAMILY LEAVE FORMS, BRING THEM TO THE OFFICE FOR PROCESSING.  DO NOT GIVE THEM TO YOUR DOCTOR.   WHEN TO CALL us [phone]: Poor pain control Reactions / problems with new medications (rash/itching, nausea, etc)  Fever over 101.5 F (38.5 C) Inability to urinate Nausea and/or vomiting Worsening swelling or bruising Continued bleeding from incision. Increased pain, redness, or drainage from the incision   The clinic staff is available to answer your questions during regular business hours (8:30am-5pm).  Please don't hesitate to call and ask to speak to one of our nurses for clinical concerns.   If you have a medical emergency, go to the nearest emergency room or call 911.  A surgeon from [facility] is always on call at the [facility], [place] [address], [place], [place]  [address] ? MAIN: [phone] ? TOLL FREE: [phone] ?  FAX [phone] [email or web address]  ##############################################################

## 2022-08-09 NOTE — Anesthesia Postprocedure Evaluation (Signed)
Anesthesia Post Note  Patient: [name]  Procedure(s) Performed: XI ROBOTIC ASSISTED HIATAL HERNIA REPAIR WITH PARTIAL TOUPET FUNDOPLICATION (Abdomen) INSERTION OF MESH (Abdomen)     Patient location during evaluation: PACU Anesthesia Type: General Level of consciousness: awake and alert Pain management: pain level controlled Vital Signs Assessment: post-procedure vital signs reviewed and stable Respiratory status: spontaneous breathing, nonlabored ventilation, respiratory function stable and patient connected to nasal cannula oxygen Cardiovascular status: blood pressure returned to baseline and stable Postop Assessment: no apparent nausea or vomiting Anesthetic complications: no  No notable events documented.  Last Vitals:  Vitals:   08/09/22 1530 08/09/22 1545  BP: 131/64 123/62  Pulse: 85 80  Resp: 20 (!) 23  Temp: 36.7 C   SpO2: 91% 93%    Last Pain:  Vitals:   08/09/22 1530  TempSrc:   PainSc: Asleep                 [name]

## 2022-08-09 NOTE — Progress Notes (Signed)
Subjective: CC: Patient tried supplemental shakes but had worsening postprandial pain and heartburn.  Severe nausea.  Hesitant to advance.  Worried about going home.  Objective: Vital signs in last 24 hours: Temp:  [97.5 F (36.4 C)-98 F (36.7 C)] 97.6 F (36.4 C) (04/02 0743) Pulse Rate:  [54-110] 54 (04/02 0743) Resp:  [15-18] 15 (04/01 1956) BP: (113-124)/(64-90) 113/67 (04/02 0743) SpO2:  [93 %-98 %] 98 % (04/02 0743) Last BM Date : 08/08/22  Intake/Output from previous day: No intake/output data recorded. Intake/Output this shift: No intake/output data recorded.  PE: Gen:  Alert, NAD, not toxic or sickly.   Psych: A&Ox3 Anxious but consolable.   Card:  Reg Pulm:  Rate and effort normal Abd: Obese but soft.  Minimally distended.  Mild epigastric discomfort but no peritonitis.     Lab Results:  No results for input(s): "WBC", "HGB", "HCT", "PLT" in the last 72 hours.  BMET No results for input(s): "NA", "K", "CL", "CO2", "GLUCOSE", "BUN", "CREATININE", "CALCIUM" in the last 72 hours.  PT/INR No results for input(s): "LABPROT", "INR" in the last 72 hours. CMP     Component Value Date/Time   NA 139 08/05/2022 0501   NA 141 01/03/2022 1454   K 3.5 08/05/2022 0501   CL 108 08/05/2022 0501   CO2 21 (L) 08/05/2022 0501   GLUCOSE 104 (H) 08/05/2022 0501   BUN 7 (L) 08/05/2022 0501   BUN 10 01/03/2022 1454   CREATININE 0.92 08/05/2022 0501   CREATININE 1.01 (H) 06/01/2022 1412   CALCIUM 9.0 08/05/2022 0501   PROT 6.9 08/03/2022 1735   ALBUMIN 3.5 08/03/2022 1735   AST 27 08/03/2022 1735   AST 21 06/01/2022 1412   ALT 20 08/03/2022 1735   ALT 15 06/01/2022 1412   ALKPHOS 146 (H) 08/03/2022 1735   BILITOT 0.3 08/03/2022 1735   BILITOT 0.1 (L) 06/01/2022 1412   GFRNONAA >60 08/05/2022 0501   GFRNONAA >60 06/01/2022 1412   GFRAA 61 03/13/2020 1140   GFRAA >60 02/25/2019 1438   Lipase  No results found for: "LIPASE"  Studies/Results: No results  found.  Anti-infectives: Anti-infectives (From admission, onward)    Start     Dose/Rate Route Frequency Ordered Stop   08/09/22 0600  cefoTEtan (CEFOTAN) 2 g in sodium chloride 0.9 % 100 mL IVPB        2 g 200 mL/hr over 30 Minutes Intravenous On call to O.R. 08/08/22 1257 08/10/22 0559        Assessment/Plan Persistent dysphagia with chronically incarcerated hiatal hernia - Last EGD 07/01/22 Dr. Therisa Doyne: tortuous lower 1/3 esophagus, 10cm hiatal hernia, patchy mildly erythematous mucosa without bleeding in gastric antrum and body  - CT 3/27 without volvulus - UGI with no evidence of obstruction - Cont PPI.  - Dr. Johney Maine started Steroids x 72 hours on 3/29  - Appreciate cardiology evaluation and clearance for surgery   Robotic hiatal hernia repair with Bio-A mesh reinforcement and partial toupet fundoplication. The anatomy & physiology of the foregut and anti-reflux mechanism was discussed.  The pathophysiology of hiatal herniation and GERD was discussed.  Natural history risks without surgery was discussed.   The patient's symptoms are not adequately controlled by medicines and other non-operative treatments.  I feel the risks of no intervention will lead to serious problems that outweigh the operative risks; therefore, I recommended surgery to reduce the hiatal hernia out of the chest and fundoplication to rebuild the anti-reflux valve and  control reflux better.  Need for a thorough workup to rule out the differential diagnosis and plan treatment was explained.  I explained minimally invasive techniques with possible need for an open approach.  Risks such as bleeding, infection, abscess, leak,injury to other organs, need for repair of tissues / organs, need for further treatment, stroke, heart attack, death, and other risks were discussed.   I noted a good likelihood this will help address the problem.  Goals of post-operative recovery were discussed as well.  Possibility that this will not  correct all symptoms was explained.  Post-operative dysphagia, need for short-term liquid & pureed diet, inability to vomit, possibility of reherniation, possible need for medicines to help control symptoms in addition to surgery were discussed.  We will work to minimize complications.   Educational handouts further explaining the pathology, treatment options, and dysphagia diet was given as well.  Questions were answered.  The patient expresses understanding & wishes to proceed with surgery.     FEN - FLD, shakes,  VTE - SCDs, subq heparin  ID - None  HTN HLD GERD Anxiety/depression Fibromyalgia, chronic pain CKD CAD IDA Incidental finding: pulmonary nodule, follow up PCP  I reviewed nursing notes, Consultant (Cards) notes, last 24 h vitals and pain scores, last 48 h intake and output, last 24 h labs and trends, and last 24 h imaging results.   LOS: 6 days    [facility] 08/09/2022, 7:56 AM Please see Amion for pager number during day hours 7:00am-4:30pm

## 2022-08-09 NOTE — Anesthesia Preprocedure Evaluation (Addendum)
Anesthesia Evaluation  Patient identified by MRN, date of birth, ID band Patient awake    Reviewed: Allergy & Precautions, H&P , NPO status , Patient's Chart, lab work & pertinent test results  Airway Mallampati: II  TM Distance: >3 FB Neck ROM: Full    Dental  (+) Upper Dentures, Lower Dentures   Pulmonary neg pulmonary ROS   Pulmonary exam normal breath sounds clear to auscultation       Cardiovascular hypertension, Pt. on medications Normal cardiovascular exam Rhythm:Regular Rate:Normal     Neuro/Psych negative neurological ROS  negative psych ROS   GI/Hepatic Neg liver ROS, hiatal hernia,GERD  Medicated,,  Endo/Other  negative endocrine ROS    Renal/GU Renal InsufficiencyRenal disease  negative genitourinary   Musculoskeletal negative musculoskeletal ROS (+)    Abdominal   Peds negative pediatric ROS (+)  Hematology negative hematology ROS (+)   Anesthesia Other Findings   Reproductive/Obstetrics negative OB ROS                             Anesthesia Physical Anesthesia Plan  ASA: 3  Anesthesia Plan: General   Post-op Pain Management: Tylenol PO (pre-op)* and Gabapentin PO (pre-op)*   Induction: Intravenous and Rapid sequence  PONV Risk Score and Plan: 3 and Ondansetron, Dexamethasone, Midazolam and Treatment may vary due to age or medical condition  Airway Management Planned: Oral ETT  Additional Equipment:   Intra-op Plan:   Post-operative Plan: Extubation in OR  Informed Consent: I have reviewed the patients History and Physical, chart, labs and discussed the procedure including the risks, benefits and alternatives for the proposed anesthesia with the patient or authorized representative who has indicated his/her understanding and acceptance.     Dental advisory given  Plan Discussed with: CRNA and Surgeon  Anesthesia Plan Comments:        Anesthesia Quick  Evaluation

## 2022-08-09 NOTE — H&P (View-Only) (Signed)
Subjective: CC: Patient tried supplemental shakes but had worsening postprandial pain and heartburn.  Severe nausea.  Hesitant to advance.  Worried about going home.  Objective: Vital signs in last 24 hours: Temp:  [97.5 F (36.4 C)-98 F (36.7 C)] 97.6 F (36.4 C) (04/02 0743) Pulse Rate:  [54-110] 54 (04/02 0743) Resp:  [15-18] 15 (04/01 1956) BP: (113-124)/(64-90) 113/67 (04/02 0743) SpO2:  [93 %-98 %] 98 % (04/02 0743) Last BM Date : 08/08/22  Intake/Output from previous day: No intake/output data recorded. Intake/Output this shift: No intake/output data recorded.  PE: Gen:  Alert, NAD, not toxic or sickly.   Psych: A&Ox3 Anxious but consolable.   Card:  Reg Pulm:  Rate and effort normal Abd: Obese but soft.  Minimally distended.  Mild epigastric discomfort but no peritonitis.     Lab Results:  No results for input(s): "WBC", "HGB", "HCT", "PLT" in the last 72 hours.  BMET No results for input(s): "NA", "K", "CL", "CO2", "GLUCOSE", "BUN", "CREATININE", "CALCIUM" in the last 72 hours.  PT/INR No results for input(s): "LABPROT", "INR" in the last 72 hours. CMP     Component Value Date/Time   NA 139 08/05/2022 0501   NA 141 01/03/2022 1454   K 3.5 08/05/2022 0501   CL 108 08/05/2022 0501   CO2 21 (L) 08/05/2022 0501   GLUCOSE 104 (H) 08/05/2022 0501   BUN 7 (L) 08/05/2022 0501   BUN 10 01/03/2022 1454   CREATININE 0.92 08/05/2022 0501   CREATININE 1.01 (H) 06/01/2022 1412   CALCIUM 9.0 08/05/2022 0501   PROT 6.9 08/03/2022 1735   ALBUMIN 3.5 08/03/2022 1735   AST 27 08/03/2022 1735   AST 21 06/01/2022 1412   ALT 20 08/03/2022 1735   ALT 15 06/01/2022 1412   ALKPHOS 146 (H) 08/03/2022 1735   BILITOT 0.3 08/03/2022 1735   BILITOT 0.1 (L) 06/01/2022 1412   GFRNONAA >60 08/05/2022 0501   GFRNONAA >60 06/01/2022 1412   GFRAA 61 03/13/2020 1140   GFRAA >60 02/25/2019 1438   Lipase  No results found for: "LIPASE"  Studies/Results: No results  found.  Anti-infectives: Anti-infectives (From admission, onward)    Start     Dose/Rate Route Frequency Ordered Stop   08/09/22 0600  cefoTEtan (CEFOTAN) 2 g in sodium chloride 0.9 % 100 mL IVPB        2 g 200 mL/hr over 30 Minutes Intravenous On call to O.R. 08/08/22 1257 08/10/22 0559        Assessment/Plan Persistent dysphagia with chronically incarcerated hiatal hernia - Last EGD 07/01/22 Dr. Therisa Doyne: tortuous lower 1/3 esophagus, 10cm hiatal hernia, patchy mildly erythematous mucosa without bleeding in gastric antrum and body  - CT 3/27 without volvulus - UGI with no evidence of obstruction - Cont PPI.  - Dr. Johney Maine started Steroids x 72 hours on 3/29  - Appreciate cardiology evaluation and clearance for surgery   Robotic hiatal hernia repair with Bio-A mesh reinforcement and partial toupet fundoplication. The anatomy & physiology of the foregut and anti-reflux mechanism was discussed.  The pathophysiology of hiatal herniation and GERD was discussed.  Natural history risks without surgery was discussed.   The patient's symptoms are not adequately controlled by medicines and other non-operative treatments.  I feel the risks of no intervention will lead to serious problems that outweigh the operative risks; therefore, I recommended surgery to reduce the hiatal hernia out of the chest and fundoplication to rebuild the anti-reflux valve and  control reflux better.  Need for a thorough workup to rule out the differential diagnosis and plan treatment was explained.  I explained minimally invasive techniques with possible need for an open approach.  Risks such as bleeding, infection, abscess, leak,injury to other organs, need for repair of tissues / organs, need for further treatment, stroke, heart attack, death, and other risks were discussed.   I noted a good likelihood this will help address the problem.  Goals of post-operative recovery were discussed as well.  Possibility that this will not  correct all symptoms was explained.  Post-operative dysphagia, need for short-term liquid & pureed diet, inability to vomit, possibility of reherniation, possible need for medicines to help control symptoms in addition to surgery were discussed.  We will work to minimize complications.   Educational handouts further explaining the pathology, treatment options, and dysphagia diet was given as well.  Questions were answered.  The patient expresses understanding & wishes to proceed with surgery.     FEN - FLD, shakes,  VTE - SCDs, subq heparin  ID - None  HTN HLD GERD Anxiety/depression Fibromyalgia, chronic pain CKD CAD IDA Incidental finding: pulmonary nodule, follow up PCP  I reviewed nursing notes, Consultant (Cards) notes, last 24 h vitals and pain scores, last 48 h intake and output, last 24 h labs and trends, and last 24 h imaging results.   LOS: 6 days    Courtney [facility] 08/09/2022, 7:56 AM Please see Amion for pager number during day hours 7:00am-4:30pm

## 2022-08-10 ENCOUNTER — Inpatient Hospital Stay (HOSPITAL_COMMUNITY): Payer: Medicare Other

## 2022-08-10 ENCOUNTER — Encounter (HOSPITAL_COMMUNITY): Payer: Self-pay | Admitting: Surgery

## 2022-08-10 DIAGNOSIS — R269 Unspecified abnormalities of gait and mobility: Secondary | ICD-10-CM | POA: Insufficient documentation

## 2022-08-10 DIAGNOSIS — G8220 Paraplegia, unspecified: Secondary | ICD-10-CM | POA: Insufficient documentation

## 2022-08-10 LAB — BASIC METABOLIC PANEL
Anion gap: 13 (ref 5–15)
BUN: 20 mg/dL (ref 8–23)
CO2: 26 mmol/L (ref 22–32)
Calcium: 9.1 mg/dL (ref 8.9–10.3)
Chloride: 99 mmol/L (ref 98–111)
Creatinine, Ser: 1.1 mg/dL — ABNORMAL HIGH (ref 0.44–1.00)
GFR, Estimated: 55 mL/min — ABNORMAL LOW (ref >60)
Glucose, Bld: 96 mg/dL (ref 70–99)
Potassium: 3.6 mmol/L (ref 3.5–5.1)
Sodium: 138 mmol/L (ref 135–145)

## 2022-08-10 LAB — CBC
HCT: 37.6 % (ref 36.0–46.0)
Hemoglobin: 12.4 g/dL (ref 12.0–15.0)
MCH: 27.1 pg (ref 26.0–34.0)
MCHC: 33 g/dL (ref 30.0–36.0)
MCV: 82.1 fL (ref 80.0–100.0)
Platelets: 185 10*3/uL (ref 150–400)
RBC: 4.58 MIL/uL (ref 3.87–5.11)
RDW: 14.6 % (ref 11.5–15.5)
WBC: 7.5 10*3/uL (ref 4.0–10.5)
nRBC: 0 % (ref 0.0–0.2)

## 2022-08-10 LAB — MAGNESIUM: Magnesium: 1.9 mg/dL (ref 1.7–2.4)

## 2022-08-10 LAB — PHOSPHORUS: Phosphorus: 4.3 mg/dL (ref 2.5–4.6)

## 2022-08-10 LAB — PREALBUMIN: Prealbumin: 24 mg/dL (ref 18–38)

## 2022-08-10 MED ORDER — SODIUM CHLORIDE 0.9 % IV SOLN: 250.0000 mL | INTRAVENOUS | Status: DC | PRN

## 2022-08-10 MED ORDER — IOHEXOL 300 MG/ML  SOLN
100.0000 mL | Freq: Once | INTRAMUSCULAR | Status: AC | PRN
  Administered 2022-08-10: 25 mL via ORAL

## 2022-08-10 MED ORDER — ENALAPRILAT 1.25 MG/ML IV SOLN: 0.6250 mg | Freq: Four times a day (QID) | INTRAVENOUS | Status: DC | PRN

## 2022-08-10 MED ORDER — SODIUM CHLORIDE 0.9% FLUSH: 3.0000 mL | Freq: Two times a day (BID) | INTRAVENOUS | Status: DC

## 2022-08-10 MED ORDER — BOOST / RESOURCE BREEZE PO LIQD CUSTOM
1.0000 | Freq: Three times a day (TID) | ORAL | Status: DC
  Administered 2022-08-10 – 2022-08-13 (×7): 1 via ORAL
  Administered 2022-08-14: 237 mL via ORAL
  Administered 2022-08-14 – 2022-08-15 (×3): 1 via ORAL

## 2022-08-10 MED ORDER — DULOXETINE HCL 20 MG PO CPEP
40.0000 mg | ORAL_CAPSULE | Freq: Two times a day (BID) | ORAL | Status: DC
  Administered 2022-08-10 – 2022-08-15 (×11): 40 mg via ORAL
  Filled 2022-08-10 (×12): qty 2

## 2022-08-10 MED ORDER — METOCLOPRAMIDE HCL 5 MG PO TABS
5.0000 mg | ORAL_TABLET | Freq: Three times a day (TID) | ORAL | Status: DC | PRN
  Administered 2022-08-10: 10 mg via ORAL
  Filled 2022-08-10: qty 2

## 2022-08-10 MED ORDER — SUCRALFATE 1 GM/10ML PO SUSP: 1.0000 g | Freq: Three times a day (TID) | ORAL | Status: DC

## 2022-08-10 MED ORDER — SODIUM CHLORIDE 0.9% FLUSH: 3.0000 mL | INTRAVENOUS | Status: DC | PRN

## 2022-08-10 MED ORDER — LIP MEDEX EX OINT
TOPICAL_OINTMENT | Freq: Two times a day (BID) | CUTANEOUS | Status: DC
  Administered 2022-08-10 – 2022-08-12 (×3): 75 via TOPICAL
  Filled 2022-08-10 (×2): qty 7

## 2022-08-10 MED ORDER — PANTOPRAZOLE SODIUM 40 MG IV SOLR
40.0000 mg | Freq: Two times a day (BID) | INTRAVENOUS | Status: DC
  Administered 2022-08-10 – 2022-08-15 (×10): 40 mg via INTRAVENOUS
  Filled 2022-08-10 (×11): qty 10

## 2022-08-10 MED ORDER — GABAPENTIN 400 MG PO CAPS
400.0000 mg | ORAL_CAPSULE | Freq: Three times a day (TID) | ORAL | Status: DC
  Administered 2022-08-10 – 2022-08-15 (×14): 400 mg via ORAL
  Filled 2022-08-10 (×15): qty 1

## 2022-08-10 MED ORDER — PANTOPRAZOLE SODIUM 40 MG PO TBEC: 40.0000 mg | DELAYED_RELEASE_TABLET | Freq: Two times a day (BID) | ORAL | Status: DC

## 2022-08-10 MED ORDER — SODIUM CHLORIDE 0.9% FLUSH
3.0000 mL | Freq: Two times a day (BID) | INTRAVENOUS | Status: DC
  Administered 2022-08-10 – 2022-08-15 (×11): 3 mL via INTRAVENOUS

## 2022-08-10 MED ORDER — SIMETHICONE 40 MG/0.6ML PO SUSP
80.0000 mg | Freq: Four times a day (QID) | ORAL | Status: DC | PRN
  Administered 2022-08-11: 80 mg via ORAL
  Filled 2022-08-10 (×2): qty 1.2

## 2022-08-10 MED ORDER — LACTATED RINGERS IV BOLUS: 1000.0000 mL | Freq: Three times a day (TID) | INTRAVENOUS | Status: DC | PRN

## 2022-08-10 MED ORDER — SUCRALFATE 1 GM/10ML PO SUSP
1.0000 g | Freq: Four times a day (QID) | ORAL | Status: AC
  Administered 2022-08-11 – 2022-08-13 (×9): 1 g via ORAL
  Filled 2022-08-10 (×11): qty 10

## 2022-08-10 MED ORDER — METHOCARBAMOL 1000 MG/10ML IJ SOLN
1000.0000 mg | Freq: Three times a day (TID) | INTRAVENOUS | Status: AC
  Administered 2022-08-11 – 2022-08-13 (×6): 1000 mg via INTRAVENOUS
  Filled 2022-08-10 (×2): qty 10
  Filled 2022-08-10 (×2): qty 1000
  Filled 2022-08-10: qty 10
  Filled 2022-08-10 (×2): qty 1000

## 2022-08-10 MED ORDER — BACLOFEN 10 MG PO TABS
10.0000 mg | ORAL_TABLET | Freq: Three times a day (TID) | ORAL | Status: DC
  Administered 2022-08-10 – 2022-08-15 (×14): 10 mg via ORAL
  Filled 2022-08-10 (×15): qty 1

## 2022-08-10 MED ORDER — LACTATED RINGERS IV SOLN: INTRAVENOUS | Status: DC

## 2022-08-10 MED ORDER — ALBUMIN HUMAN 5 % IV SOLN
12.5000 g | Freq: Four times a day (QID) | INTRAVENOUS | Status: DC | PRN
  Administered 2022-08-10: 12.5 g via INTRAVENOUS
  Filled 2022-08-10 (×2): qty 250

## 2022-08-10 MED ORDER — DEXAMETHASONE SODIUM PHOSPHATE 4 MG/ML IJ SOLN
4.0000 mg | Freq: Two times a day (BID) | INTRAMUSCULAR | Status: DC
  Administered 2022-08-10 – 2022-08-14 (×8): 4 mg via INTRAVENOUS
  Filled 2022-08-10 (×9): qty 1

## 2022-08-10 MED ORDER — FUROSEMIDE 10 MG/ML IJ SOLN
40.0000 mg | Freq: Every day | INTRAMUSCULAR | Status: DC
  Filled 2022-08-10: qty 4

## 2022-08-10 MED ORDER — HYDROMORPHONE HCL 1 MG/ML IJ SOLN
0.5000 mg | INTRAMUSCULAR | Status: DC | PRN
  Administered 2022-08-10 (×2): 2 mg via INTRAVENOUS
  Administered 2022-08-11: 1 mg via INTRAVENOUS
  Administered 2022-08-12: 2 mg via INTRAVENOUS
  Administered 2022-08-12 – 2022-08-14 (×2): 1 mg via INTRAVENOUS
  Filled 2022-08-10 (×2): qty 2
  Filled 2022-08-10: qty 1
  Filled 2022-08-10: qty 2
  Filled 2022-08-10 (×2): qty 1

## 2022-08-10 MED ORDER — ACETAMINOPHEN 500 MG PO TABS
500.0000 mg | ORAL_TABLET | Freq: Three times a day (TID) | ORAL | Status: DC
  Administered 2022-08-10 – 2022-08-15 (×15): 500 mg via ORAL
  Filled 2022-08-10 (×15): qty 1

## 2022-08-10 NOTE — Plan of Care (Signed)
Nutrition Education Note  RD consulted for nutrition education regarding post fundoplication.  RD provided "Pureed Diet" handout from the Academy of [employer] and Dietetics. Reviewed patient's dietary recall. Provided examples on ways to modify foods to be of pureed/liquid consistency (able to pass through a straw). Discouraged intake of solid or hard textured foods.  Discussed ways that pt could utilize a blender or food processor to achieve desired consistency. Encouraged increased protein intake and oral nutrition supplements/ use of protein powders added to foods, soups, and smoothies.  RD discussed why it is important for patient to adhere to diet recommendations. Discussed plan to continue with current diet until wires can be removed. Teach back method used.  Expect good compliance.  Body mass index is 34.54 kg/m. Pt meets criteria for obese class I based on current BMI.  Current diet order is clear liquid, patient has not consumed a meal at this time. Labs and medications reviewed. No further nutrition interventions warranted at this time. RD contact information provided. If additional nutrition issues arise, please re-consult RD.   Trey Paula, RDN, LDN  Clinical Nutrition

## 2022-08-10 NOTE — Progress Notes (Signed)
[name] [id number] [birth year]  CARE TEAM:  PCP: [name], MD  Outpatient Care Team: Patient Care Team: [name], MD as PCP - General (Internal Medicine) Minus Breeding, MD as PCP - Cardiology (Cardiology) [name] (Gastroenterology) [name], MD as Consulting Physician (Gastroenterology) [name], MD as Consulting Physician (General Surgery)  Inpatient Treatment Team: Treatment Team: Attending Provider: [name], MD; Attending Physician: [name], MD; Consulting Physician: [name], MD; Consulting Physician: [name], MD; Pharmacist: [name], [facility]; Utilization Review: [name], RN; Technician: [name], [place]; Registered Nurse: [name], RN; Social Worker: [name], [name]; Mobility Specialist: [name]; Case Manager: [name], RN   Problem List:   Principal Problem:   Incarcerated hiatal hernia Active Problems:   Coronary artery disease involving native coronary artery of native heart with angina pectoris   Tachycardia-bradycardia syndrome   Lumbar spondylosis   Cervical radiculopathy   Iron deficiency anemia   Low back pain   Lumbar post-laminectomy syndrome   Muscle weakness   Recurrent falls   1 Day Post-Op  08/09/2022  POST-OPERATIVE DIAGNOSIS:  Incarcerated Hiatal hernia with organoaxial volvulus & partial obstruction   PROCEDURE:     1. ROBOTIC reduction of paraesophageal hiatal hernia 2. Type II mediastinal dissection. 3. Primary repair of hiatal hernia over pledgets.  4. Anterior & posterior gastropexy. 5. Toupet (270 degree partial posterior x 4cm)  fundoplication 6. Mesh reinforcement with absorbable mesh   SURGEON:  [name], MD  OR FINDINGS:    Moderate-sized paraesophageal hiatal hernia with 50% of the stomach in the mediastinum.  There was a 11 x 9 cm hiatal defect.   It is a primary repair over pledgets.  Mesh reinforcement was used with  GORE BIO-A mesh, a biosynthetic web scaffold made of [id number] polyglycolic acid (PGA): [id number] trimethylene carbonate (TMC).   The patient has a Toupet (270 degree partial posterior x 4cm)  fundoplication.  The patient has had anterior and posterior gastropexy.    Assessment  Stable but inadequate pain control  [facility] Stay = 7 days)  Plan:  -I think there is concern orders were not released.  I released him.  Improved pain control.  She is on multiple muscle relaxers and chronic pain meds.  Double her Cymbalta.  Make her baclofen scheduled.  As needed muscle relaxers Zanaflex and methocarbamol for breakthrough.  Low-dose Tylenol around-the-clock.  Hydrocodone and hydromorphone for breakthrough.  Esophagram swallow study this morning.  If no frank leak or obstruction, safe to advance to dysphagia 1 diet.  PPI prophylaxis.  Switch to p.o.  Most likely can wean off in 3-6 weeks when she is further out.  Mobilize.  She claims she has been up to bathroom but has issues of leg weakness and unsteadiness on an aggressive pain and muscle relaxant regimen.  Ask therapies to evaluate for a safe plan.  Make sure she has adequate equipment.    [name] and asked medicine to be involved if she is having issues.  Seems stable so far.    Wean off oxygen.  Double check labs.    Disposition: To be determined.  Possibly discharge in the next 1-2 days if adequate p.o. intake pain control and disposition plan       I reviewed nursing notes, last 24 h vitals and pain scores, last 48 h intake and output, last 24 h labs and trends, and last 24 h imaging results. I have reviewed  this patient's available data, including medical history, events of note, test results, etc as part of my evaluation.  A significant portion of that time was spent in counseling.  Care during the described time interval was provided by me.  This care required moderate level of medical decision making.   08/10/2022    Subjective: (Chief complaint)  Patient with a fair amount of soreness.  Feels behind on her muscle cramping.  Did walk into the room 3 times.  Tolerating ice chips.  Nursing just outside room.  Objective:  Vital signs:  Vitals:   08/09/22 2008 08/10/22 0002 08/10/22 0536 08/10/22 0811  BP: 133/80 138/80 (!) 146/78 (!) 114/58  Pulse: 90 91 88 83  Resp:  17 17 16   Temp: 98.3 F (36.8 C) 98.1 F (36.7 C) 98.4 F (36.9 C) 99.1 F (37.3 C)  TempSrc: Oral Oral Oral Oral  SpO2: 96% 90% 93% 92%  Weight:      Height:        Last BM Date : 08/08/22  Intake/Output   Yesterday:  04/02 0701 - 04/03 0700 In: 2100 [I.V.:1500; IV Piggyback:600] Out: 20 [Blood:20] This shift:  No intake/output data recorded.  Bowel function:  Flatus: No  BM:  No  Drain: Serosanguinous   Physical Exam:  General: Pt awake/alert in no acute distress.  Sore/uncomfortable but not toxic nor sickly. Eyes: PERRL, normal EOM.  Sclera clear.  No icterus Neuro: CN II-XII intact w/o focal sensory/motor deficits. Lymph: No head/neck/groin lymphadenopathy Psych:  No delerium/psychosis/paranoia.  Oriented x 4 HENT: Normocephalic, Mucus membranes moist.  No thrush Neck: Supple, No tracheal deviation.  No obvious thyromegaly Chest: No pain to chest wall compression.  Good respiratory excursion.  No audible wheezing CV:  Pulses intact.  Regular rhythm.  No major extremity edema MS: Normal AROM mjr joints.  No obvious deformity  Abdomen: Soft.  Nondistended.  Mildly tender at incisions only.  No evidence of peritonitis.  No incarcerated hernias.  Ext:   No deformity.  No mjr edema.  No cyanosis Skin: No petechiae / purpurea.  No major sores.  Warm and dry    Results:   Cultures: No results found for this or any previous visit (from the past 720 hour(s)).  Labs: No results found for this or any previous visit (from the past 48 hour(s)).  Imaging / Studies: No results  found.  Medications / Allergies: per chart  Antibiotics: Anti-infectives (From admission, onward)    Start     Dose/Rate Route Frequency Ordered Stop   08/09/22 1045  cefTRIAXone (ROCEPHIN) 2 g in sodium chloride 0.9 % 100 mL IVPB        2 g 200 mL/hr over 30 Minutes Intravenous On call to O.R. 08/09/22 1033 08/09/22 1346   08/09/22 0600  cefoTEtan (CEFOTAN) 2 g in sodium chloride 0.9 % 100 mL IVPB        2 g 200 mL/hr over 30 Minutes Intravenous On call to O.R. 08/08/22 1257 08/10/22 0559         Note: Portions of this report may have been transcribed using voice recognition software. Every effort was made to ensure accuracy; however, inadvertent computerized transcription errors may be present.   Any transcriptional errors that result from this process are unintentional.    [name], MD, FACS, MASCRS Esophageal, Gastrointestinal & Colorectal Surgery Robotic and Minimally Invasive Surgery  [facility]. [address] [place], [place] [address] (  [phone] Fax [phone] Main  CONTACT INFORMATION:  Weekday (9AM-5PM): Call [facility] main office at [phone]  Weeknight (5PM-9AM) or Weekend/Holiday: Check [email or web address] (password " [email or web address]") for General Surgery [facility] coverage  (Please, do not use SecureChat as it is not reliable communication to reach operating surgeons for immediate patient care given surgeries/outpatient duties/clinic/cross-coverage/off post-call which would lead to a delay in care.  Epic staff messaging available for outptient concerns, but may not be answered for 48 hours or more).     08/10/2022  8:50 AM

## 2022-08-10 NOTE — Progress Notes (Signed)
Esophagram w very tight wrap Lasix x3 d Decadron x3d Repeat esophagram in 3 days Follow labs & BP

## 2022-08-10 NOTE — Progress Notes (Signed)
PT Cancellation Note  Patient Details Name: [name] MRN: [id number] DOB: 01/04/1956   Cancelled Treatment:    Reason Eval/Treat Not Completed: Fatigue/lethargy limiting ability to participate. Pt is lethargic at this time, spouse reports the pt recently received pain medications. Pt does not arouse to verbal stimuli, very briefly opens one eye with tactile stimulation. PT will attempt to follow up at a later time.   Zenaida Niece 08/10/2022, 3:16 PM

## 2022-08-10 NOTE — Social Work (Signed)
  Transition of Care [facility]) Screening Note   Patient Details  Name: [name] Date of Birth: 1956-04-01   Transition of Care [facility]) CM/SW Contact:    [name], [place] Phone Number: 08/10/2022, 11:44 AM    Transition of Care Department [facility]) has reviewed patient and no TOC needs have been identified at this time. We will continue to monitor patient advancement through interdisciplinary progression rounds. If new patient transition needs arise, please place a TOC consult.

## 2022-08-10 NOTE — Progress Notes (Signed)
PT Cancellation Note  Patient Details Name: [name] MRN: [id number] DOB: 1955/08/20   Cancelled Treatment:    Reason Eval/Treat Not Completed: Fatigue/lethargy limiting ability to participate. Pt remains lethargic at this time, opens eyes to verbal stimuli but is very groggy and slow to respond. PT will follow up tomorrow.   Zenaida Niece 08/10/2022, 5:27 PM

## 2022-08-10 NOTE — Care Management Important Message (Signed)
Important Message  Patient Details  Name: [name] MRN: [id number] Date of Birth: January 06, 1956   Medicare Important Message Given:  Yes     Hannah Beat 08/10/2022, 9:52 AM

## 2022-08-11 LAB — POTASSIUM: Potassium: 3.9 mmol/L (ref 3.5–5.1)

## 2022-08-11 LAB — CREATININE, SERUM
Creatinine, Ser: 2.16 mg/dL — ABNORMAL HIGH (ref 0.44–1.00)
GFR, Estimated: 24 mL/min — ABNORMAL LOW (ref >60)

## 2022-08-11 LAB — HEMOGLOBIN: Hemoglobin: 11.8 g/dL — ABNORMAL LOW (ref 12.0–15.0)

## 2022-08-11 MED ORDER — LACTATED RINGERS IV SOLN: INTRAVENOUS | Status: DC

## 2022-08-11 MED ORDER — LACTATED RINGERS IV BOLUS: 1000.0000 mL | Freq: Three times a day (TID) | INTRAVENOUS | Status: DC | PRN

## 2022-08-11 NOTE — TOC Initial Note (Addendum)
Transition of Care (TOC) - Initial/Assessment Note   Spoke to patient and [name] ( significant other ) at bedside. Discussed PT recommendations for HHPT.   Patient was going to OP PT at [facility] prior to admission. Patient and Darnell Level prefer to continue to go to OP PT at [facility].   NCM called OP PT at [facility] . They will need a new referral form signed and PT notes faxed to them. NCM printed form and filled out , left on chart for signature. Secure chatted PA.   Patient has all DME at home already, rollator, walk in shower with seat, rails new steps.   Referral form for OP PT signed and faxed along with clinicals  Patient Details  Name: [name] MRN: [id number] Date of Birth: [birth year]  Transition of Care [facility]) CM/SW Contact:    Marilu Favre, RN Phone Number: 08/11/2022, 2:20 PM  Clinical Narrative:                   Expected Discharge Plan: Home/Self Care Barriers to Discharge: Continued Medical Work up   Patient Goals and CMS Choice Patient states their goals for this hospitalization and ongoing recovery are:: to return to home     [place] ownership interest in [facility].provided to:: Patient    Expected Discharge Plan and Services   Discharge Planning Services: CM Consult   Living arrangements for the past 2 months: Single Family Home                 DME Arranged: N/A DME Agency: NA       HH Arranged: Patient Refused HH          Prior Living Arrangements/Services Living arrangements for the past 2 months: Single Family Home Lives with:: Significant Other Patient language and need for interpreter reviewed:: Yes Do you feel safe going back to the place where you live?: Yes      Need for Family Participation in Patient Care: Yes (Comment) Care giver support system in place?: Yes (comment) Current home services: DME Criminal Activity/Legal Involvement Pertinent to Current  Situation/Hospitalization: No - Comment as needed  Activities of Daily Living Home Assistive Devices/Equipment: Cane (specify quad or straight), Walker (specify type) ADL Screening (condition at time of admission) Patient's cognitive ability adequate to safely complete daily activities?: Yes Is the patient deaf or have difficulty hearing?: No Does the patient have difficulty seeing, even when wearing glasses/contacts?: No Does the patient have difficulty concentrating, remembering, or making decisions?: No Patient able to express need for assistance with ADLs?: Yes Does the patient have difficulty dressing or bathing?: No Independently performs ADLs?: Yes (appropriate for developmental age) Does the patient have difficulty walking or climbing stairs?: Yes Weakness of Legs: Both Weakness of Arms/Hands: Left  Permission Sought/Granted   Permission granted to share information with : Yes, Verbal Permission Granted  Share Information with NAME: Michaele Offer           Emotional Assessment Appearance:: Appears stated age       Alcohol / Substance Use: Not Applicable Psych Involvement: No (comment)  Admission diagnosis:  Acute gastric volvulus [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] Patient Active Problem List   Diagnosis Date Noted   Abnormal gait 08/10/2022   Paraparesis 08/10/2022   Incarcerated hiatal hernia 08/03/2022   Iron deficiency anemia 02/07/2022   Low back pain 12/07/2021   Recurrent falls 12/07/2021   Muscle weakness 04/14/2021   Lumbar post-laminectomy syndrome 08/14/2020  Accelerating angina 03/16/2020   Microcytic hypochromic anemia 06/15/2018   Fracture of left wrist 06/15/2018   Lumbar spondylosis 04/27/2017   Cervical radiculopathy 04/27/2017   Tachycardia-bradycardia syndrome 02/15/2017   Coronary artery disease involving native coronary artery of native heart with angina pectoris    Pure hypercholesterolemia    Chest pain with moderate risk for cardiac etiology    Prolonged QT  interval    Bradycardia    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

## 2022-08-11 NOTE — Evaluation (Signed)
Occupational Therapy Evaluation Patient Details Name: [name] MRN: [id number] DOB: [birth year] Today's Date: 08/11/2022   History of Present Illness 67 y.o. female presents to [facility] 3/27 hospital with LUQ pain and vomiting, referred for management of hiatal hernia. Pt underwent robotic assisted hernia repair on 4/2. PMH includes anxiety, anemia, CAD, chronic back pain, CKD, depression, HTN.   Clinical Impression   Pt s/p above mention surgery for hiatal hernia. Pt has hx of B knee and shoulder surgeries, B wrist injuries/surgeries, decreased sensation to BLEs and B hands, L worse than R. Pt fiance is available at all times at home to assist as needed. Pt would benefit greatly from continued skilled therapy during stay to improve functional strength/endurance, instruct on HEP and compensatory strategies. Pt currently receiving OPPT for BLEs, would benefit from referral to OPOT to improve BUE strength and coordination.     Recommendations for follow up therapy are one component of a multi-disciplinary discharge planning process, led by the attending physician.  Recommendations may be updated based on patient status, additional functional criteria and insurance authorization.   Assistance Recommended at Discharge Intermittent Supervision/Assistance  Patient can return home with the following A little help with walking and/or transfers;A little help with bathing/dressing/bathroom;Assistance with cooking/housework;Assist for transportation;Help with stairs or ramp for entrance    Functional Status Assessment  Patient has had a recent decline in their functional status and demonstrates the ability to make significant improvements in function in a reasonable and predictable amount of time.  Equipment Recommendations  None recommended by OT    Recommendations for Other Services       Precautions / Restrictions Precautions Precautions: Fall Restrictions Weight Bearing Restrictions: No       Mobility Bed Mobility               General bed mobility comments: sitting in chair, able to scoot forward/backwards as needed    Transfers Overall transfer level: Needs assistance Equipment used: Rolling walker (2 wheels), Rollator (4 wheels) Transfers: Sit to/from Stand Sit to Stand: Supervision           General transfer comment: Pt able to stand from seated position using rollator with S. Pt leans forward while sitting without reaching back to hold armrests, S for safety      Balance Overall balance assessment: Needs assistance Sitting-balance support: No upper extremity supported, Feet supported Sitting balance-Leahy Scale: Good     Standing balance support: Reliant on assistive device for balance, Single extremity supported Standing balance-Leahy Scale: Poor                             ADL either performed or assessed with clinical judgement   ADL Overall ADL's : Needs assistance/impaired Eating/Feeding: Set up   Grooming: Set up   Upper Body Bathing: Supervision/ safety   Lower Body Bathing: Min guard   Upper Body Dressing : Set up   Lower Body Dressing: Min guard   Toilet Transfer: Supervision/safety   Toileting- Clothing Manipulation and Hygiene: Supervision/safety   Tub/ Shower Transfer: Supervision/safety   Functional mobility during ADLs: Supervision/safety General ADL Comments: Pt had some difficulty with LB dressing, difficulty bending down reaching B feet     Vision         Perception     Praxis      Pertinent Vitals/Pain Pain Assessment Pain Assessment: 0-10 Pain Score: 9  Pain Location: Abdomen, L leg Pain Descriptors / Indicators:  Aching, Spasm Pain Intervention(s): Monitored during session     Hand Dominance Right   Extremity/Trunk Assessment Upper Extremity Assessment Upper Extremity Assessment: RUE deficits/detail;LUE deficits/detail;Generalized weakness RUE Deficits / Details: hx of B shoulder/wrist  surgeries, decreased ROM B shoulders, muscle spasms, decreased strength RUE: Shoulder pain with ROM RUE Sensation: history of peripheral neuropathy RUE Coordination: decreased fine motor;decreased gross motor LUE Deficits / Details: hx of B shoulder/wrist surgeries, decreased ROM B shoulders, muscle spasms, decreased strength LUE: Shoulder pain with ROM LUE Sensation: history of peripheral neuropathy LUE Coordination: decreased fine motor;decreased gross motor   Lower Extremity Assessment Lower Extremity Assessment: Generalized weakness       Communication Communication Communication: No difficulties   Cognition Arousal/Alertness: Awake/alert Behavior During Therapy: WFL for tasks assessed/performed Overall Cognitive Status: Within Functional Limits for tasks assessed                                       General Comments  Husband present, very supportive. Eduation for OOB mobility with staff throughout the day, safety awareness.    Exercises     Shoulder Instructions      Home Living Family/patient expects to be discharged to:: Private residence Living Arrangements: Spouse/significant other Available Help at Discharge: Family;Available 24 hours/day Type of Home: Mobile home (Rv) Home Access: Stairs to enter Entrance Stairs-Number of Steps: 5 Entrance Stairs-Rails: Left;Right;Can reach both Home Layout: One level     Bathroom Shower/Tub: Occupational psychologist: Handicapped height     Home Equipment: Air cabin crew (4 wheels) ([place] crutches)          Prior Functioning/Environment Prior Level of Function : History of Falls (last six months);Independent/Modified Independent             Mobility Comments: Primarily ambulates with loftstrand crutches. WIll use rollator shopping. falls past 6 months ADLs Comments: mod I        OT Problem List: Decreased strength;Decreased range of motion;Decreased activity  tolerance;Impaired balance (sitting and/or standing);Decreased coordination;Impaired sensation;Impaired UE functional use;Pain      OT Treatment/Interventions: Self-care/ADL training;Therapeutic exercise;Neuromuscular education;Energy conservation;Manual therapy;Therapeutic activities;Patient/family education    OT Goals(Current goals can be found in the care plan section) Acute Rehab OT Goals Patient Stated Goal: To reduce pain and recover from surgery to return home at [facility] OT Goal Formulation: With patient Time For Goal Achievement: 08/25/22 Potential to Achieve Goals: Good  OT Frequency: Min 2X/week    Co-evaluation              AM-PAC OT "6 Clicks" Daily Activity     Outcome Measure Help from another person eating meals?: A Little Help from another person taking care of personal grooming?: A Little Help from another person toileting, which includes using toliet, bedpan, or urinal?: A Little Help from another person bathing (including washing, rinsing, drying)?: A Little Help from another person to put on and taking off regular upper body clothing?: A Little Help from another person to put on and taking off regular lower body clothing?: A Little 6 Click Score: 18   End of Session Equipment Utilized During Treatment: Gait belt;Rollator (4 wheels) Nurse Communication: Mobility status  Activity Tolerance: Patient tolerated treatment well Patient left: in chair;with call bell/phone within reach;with chair alarm set;with nursing/sitter in room;with family/visitor present  OT Visit Diagnosis: Unsteadiness on feet (R26.81);Other abnormalities of gait and mobility (R26.89);Repeated falls (R29.6);Muscle weakness (generalized) (  M62.81);Pain;Other symptoms and signs involving the nervous system (R29.898) Pain - Right/Left: Left Pain - part of body: Leg                Time: [id number] OT Time Calculation (min): 31 min Charges:  OT General Charges $OT Visit: 1 Visit OT  Evaluation $OT Eval Moderate Complexity: 1 Mod OT Treatments $Self Care/Home Management : 8-22 mins  [place] 08/11/2022, 10:42 AM

## 2022-08-11 NOTE — Progress Notes (Signed)
[facility] Progress Note  2 Days Post-Op  Subjective: CC-  Up in chair. She has had some soft/low Bps over night. No tachycardia. Denies lightheadedness or dizziness. Now requiring some supplemental oxygen. She does feel some SOB with ambulation, improves at rest. She does state that taking a deep breath hurts in her abdomen. Denies CP.  Creatinine 2.16 from 1.1. unsure exactly how much UOP she has had but she did void a good amount this morning. Denies dysuria.  Objective: Vital signs in last 24 hours: Temp:  [98.4 F (36.9 C)-100.9 F (38.3 C)] 100.9 F (38.3 C) (04/04 1014) Pulse Rate:  [73-89] 76 (04/04 1014) Resp:  [14-20] 20 (04/04 1014) BP: (68-118)/(34-67) 118/62 (04/04 1014) SpO2:  [88 %-95 %] 95 % (04/04 1014) Last BM Date : 08/08/22  Intake/Output from previous day: 04/03 0701 - 04/04 0700 In: 400 [P.O.:150; IV Piggyback:250] Out: 20 [Drains:20] Intake/Output this shift: No intake/output data recorded.  PE: Gen:  Alert, NAD, pleasant Card:  RRR Pulm:  CTAB, no W/R/R, rate and effort normal on [facility] Abd: Soft, nondistended, appropriately tender, incisions with cdi dressings present. JP serosanguinous   Lab Results:  Recent Labs    08/10/22 1154 08/11/22 0407  WBC 7.5  --   HGB 12.4 11.8*  HCT 37.6  --   PLT 185  --    BMET Recent Labs    08/10/22 1154 08/11/22 0407  NA 138  --   K 3.6 3.9  CL 99  --   CO2 26  --   GLUCOSE 96  --   BUN 20  --   CREATININE 1.10* 2.16*  CALCIUM 9.1  --    PT/INR No results for input(s): "LABPROT", "INR" in the last 72 hours. CMP     Component Value Date/Time   NA 138 08/10/2022 1154   NA 141 01/03/2022 1454   K 3.9 08/11/2022 0407   CL 99 08/10/2022 1154   CO2 26 08/10/2022 1154   GLUCOSE 96 08/10/2022 1154   BUN 20 08/10/2022 1154   BUN 10 01/03/2022 1454   CREATININE 2.16 (H) 08/11/2022 0407   CREATININE 1.01 (H) 06/01/2022 1412   CALCIUM 9.1 08/10/2022 1154   PROT 6.9 08/03/2022 1735    ALBUMIN 3.5 08/03/2022 1735   AST 27 08/03/2022 1735   AST 21 06/01/2022 1412   ALT 20 08/03/2022 1735   ALT 15 06/01/2022 1412   ALKPHOS 146 (H) 08/03/2022 1735   BILITOT 0.3 08/03/2022 1735   BILITOT 0.1 (L) 06/01/2022 1412   GFRNONAA 24 (L) 08/11/2022 0407   GFRNONAA >60 06/01/2022 1412   GFRAA 61 03/13/2020 1140   GFRAA >60 02/25/2019 1438   Lipase  No results found for: "LIPASE"     Studies/Results: DG ESOPHAGUS W SINGLE CM (SOL OR THIN BA)  Result Date: 08/10/2022 CLINICAL DATA:  Patient with a history of incarcerated hiatal hernia status post repair. Post-op water-soluble contrast study requested. EXAM: ESOPHAGUS/BARIUM SWALLOW/TABLET STUDY TECHNIQUE: Single contrast examination was performed using water-soluble contrast. This exam was performed by Soyla Dryer, NP, and was supervised and interpreted by Dr. Maree Erie. FLUOROSCOPY: Radiation Exposure Index (as provided by the fluoroscopic device): 28.20 mGy Kerma COMPARISON:  UGI 08/04/22 FINDINGS: Swallowing: Appears normal. No vestibular penetration or aspiration seen. Pharynx: Unremarkable. Esophagus: Normal appearance. Esophageal motility: Contrast rapidly passed to the distal esophagus but would not empty into the stomach. After several minutes a very faint trickle went into the stomach but then stopped. After 7 minutes  the contrast remained in the distal esophagus. Hiatal Hernia: None. Gastroesophageal reflux: Not tested Ingested 36mm barium tablet: Not given Other: None. IMPRESSION: Limited study. No contrast extravasation identified. With the exception of a very small amount, the contrast did not pass into the stomach. Due to patient pain and discomfort on the table the procedure was stopped after approximately 7 minutes. Ordering provider made aware. Read by: Soyla Dryer, NP Electronically Signed   By: Nelson Chimes M.D.   On: 08/10/2022 10:49    Anti-infectives: Anti-infectives (From admission, onward)    Start      Dose/Rate Route Frequency Ordered Stop   08/09/22 1045  cefTRIAXone (ROCEPHIN) 2 g in sodium chloride 0.9 % 100 mL IVPB        2 g 200 mL/hr over 30 Minutes Intravenous On call to O.R. 08/09/22 1033 08/09/22 1346   08/09/22 0600  cefoTEtan (CEFOTAN) 2 g in sodium chloride 0.9 % 100 mL IVPB        2 g 200 mL/hr over 30 Minutes Intravenous On call to O.R. 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 ROBOTIC reduction of paraesophageal hiatal hernia 2. Type II mediastinal dissection. 3. Primary repair of hiatal hernia over pledgets.  4. Anterior & posterior gastropexy. 5. Toupet (270 degree partial posterior x 4cm)  fundoplication 6. Mesh reinforcement with absorbable mesh - Esophagram 4/3 with very tight wrap. Plan was to do lasix/decadron x3 days and repeat e-gram in 3 days. Creatinine up today so we are stopping the lasix. Continue decadron. Continue CLD - Likely dehydrated with soft Bps and elevated creatinine. Start maintenance fluids at 50cc/hr. Strict I&O and monitor UOP. Recheck Cr in AM - Needs to mobilize more, PT/OT. Pulm toilet. IS, add flutter valve. Wean supplemental O2 as able - continue PPI  ID - abx periop FEN - IVF, CLD VTE - sq heparin Foley - none  HTN HLD GERD Anxiety/depression Fibromyalgia, chronic pain CKD Nonobstructive CAD IDA Incidental finding: pulmonary nodule, follow up PCP    LOS: 8 days    [place], [facility] 08/11/2022, 10:41 AM Please see Amion for pager number during day hours 7:00am-4:30pm

## 2022-08-11 NOTE — Progress Notes (Signed)
Physical Therapy Evaluation Patient Details Name: [name] MRN: [id number] DOB: 19-Dec-1955 Today's Date: 08/11/2022  History of Present Illness  67 y.o. female presents to [facility] 3/27 hospital with LUQ pain and vomiting, referred for management of hiatal hernia. Pt underwent robotic assisted hernia repair on 4/2. PMH includes anxiety, anemia, CAD, chronic back pain, CKD, depression, HTN.  Clinical Impression  Patient is s/p above surgery resulting in functional limitations due to the deficits listed below (see PT Problem List). Good family support from husband available 24/7. Previously independent using loftstrand crutches and rollator for mobility at baseline. Was attending OPPT for sciatica and making good progress however reportedly has flared up during admission. Currently mobilizing at a supervision level with RW and rollator in room, short distances. Will need to practice navigating stairs; may be a bit challenging due to considerable UE weakness and poor grip (baseline,) but husband eager to help. Patient will benefit from acute skilled PT to increase their independence and safety with mobility to facilitate discharge.        Recommendations for follow up therapy are one component of a multi-disciplinary discharge planning process, led by the attending physician.  Recommendations may be updated based on patient status, additional functional criteria and insurance authorization.  Follow Up Recommendations   (HHPT, may decline, if so OPPT)     Assistance Recommended at Discharge Intermittent Supervision/Assistance  Patient can return home with the following  A little help with walking and/or transfers;A little help with bathing/dressing/bathroom;Assistance with cooking/housework;Help with stairs or ramp for entrance;Assist for transportation    Equipment Recommendations None recommended by PT  Recommendations for Other Services       Functional Status Assessment Patient has had a  recent decline in their functional status and demonstrates the ability to make significant improvements in function in a reasonable and predictable amount of time.     Precautions / Restrictions Precautions Precautions: Fall Restrictions Weight Bearing Restrictions: No      Mobility  Bed Mobility               General bed mobility comments: In rest room when PT entered room    Transfers Overall transfer level: Needs assistance Equipment used: Rolling walker (2 wheels), Rollator (4 wheels) Transfers: Sit to/from Stand Sit to Stand: Supervision           General transfer comment: Supervision for safety, practiced with RW and rollator seperately to assess stability. Effortful rise but without extrernal assist.    Ambulation/Gait Ambulation/Gait assistance: Supervision Gait Distance (Feet): 30 Feet Assistive device: Rolling walker (2 wheels), Rollator (4 wheels) Gait Pattern/deviations: Step-through pattern, Decreased stride length, Trunk flexed Gait velocity: decr Gait velocity interpretation: <1.31 ft/sec, indicative of household ambulator   General Gait Details: Educated on RW use, followed by rollator. Pt with difficulty holding RW due to hx of UE weakness. Cues for upright stance and proximity to device. Better grip and control with rollator (which she uses at baseline occasionally.) No buckling noted, mild instability present, easily fatigued.  Stairs            Wheelchair Mobility    Modified Rankin (Stroke Patients Only)       Balance Overall balance assessment: Needs assistance Sitting-balance support: No upper extremity supported, Feet supported Sitting balance-Leahy Scale: Good     Standing balance support: Reliant on assistive device for balance, Single extremity supported Standing balance-Leahy Scale: Poor  Pertinent Vitals/Pain Pain Assessment Pain Assessment: 0-10 Pain Score: 9  Pain  Location: Abdomen, Leg Lt arm and Lt neck Pain Descriptors / Indicators: Aching, Spasm Pain Intervention(s): Monitored during session, Repositioned    Home Living Family/patient expects to be discharged to:: Private residence Living Arrangements: Spouse/significant other Available Help at Discharge: Family;Available 24 hours/day Type of Home: Mobile home (Rv) Home Access: Stairs to enter Entrance Stairs-Rails: Left;Right;Can reach both Entrance Stairs-Number of Steps: 5   Home Layout: One level Home Equipment: Air cabin crew (4 wheels) ([place] crutches)      Prior Function Prior Level of Function : History of Falls (last six months);Independent/Modified Independent             Mobility Comments: Primarily ambulates with loftstrand crutches. WIll use rollator shopping. falls past 6 months ADLs Comments: Able to bath/dress herself     Hand Dominance   Dominant Hand: Right    Extremity/Trunk Assessment   Upper Extremity Assessment Upper Extremity Assessment: Defer to OT evaluation    Lower Extremity Assessment Lower Extremity Assessment: Generalized weakness       Communication   Communication: No difficulties  Cognition Arousal/Alertness: Awake/alert Behavior During Therapy: WFL for tasks assessed/performed Overall Cognitive Status: Within Functional Limits for tasks assessed                                          General Comments General comments (skin integrity, edema, etc.): Husband present, very supportive. Eduation for OOB mobility with staff throughout the day, safety awareness.    Exercises     Assessment/Plan    PT Assessment Patient needs continued PT services  PT Problem List Decreased strength;Decreased activity tolerance;Decreased balance;Decreased mobility;Decreased knowledge of use of DME;Obesity;Pain       PT Treatment Interventions DME instruction;Gait training;Stair training;Functional mobility  training;Therapeutic exercise;Therapeutic activities;Balance training;Neuromuscular re-education;Patient/family education;Modalities    PT Goals (Current goals can be found in the Care Plan section)  Acute Rehab PT Goals Patient Stated Goal: Get well PT Goal Formulation: With patient/family Time For Goal Achievement: 08/25/22 Potential to Achieve Goals: Good    Frequency Min 3X/week     Co-evaluation               AM-PAC PT "6 Clicks" Mobility  Outcome Measure Help needed turning from your back to your side while in a flat bed without using bedrails?: A Little Help needed moving from lying on your back to sitting on the side of a flat bed without using bedrails?: A Little Help needed moving to and from a bed to a chair (including a wheelchair)?: A Little Help needed standing up from a chair using your arms (e.g., wheelchair or bedside chair)?: A Little Help needed to walk in hospital room?: A Little Help needed climbing 3-5 steps with a railing? : A Lot 6 Click Score: 17    End of Session Equipment Utilized During Treatment: Gait belt;Oxygen Activity Tolerance: Patient tolerated treatment well Patient left: in chair;with call bell/phone within reach;with chair alarm set;with family/visitor present   PT Visit Diagnosis: Unsteadiness on feet (R26.81);Other abnormalities of gait and mobility (R26.89);History of falling (Z91.81);Muscle weakness (generalized) (M62.81);Difficulty in walking, not elsewhere classified (R26.2);Pain Pain - part of body:  (Abdomen)    Time: [id number] PT Time Calculation (min) (ACUTE ONLY): 20 min   Charges:   PT Evaluation $PT Eval Low Complexity: 1 Low  Candie Mile, PT, DPT Physical Therapist Acute Rehabilitation Services [facility]   Ellouise Newer 08/11/2022, 10:18 AM

## 2022-08-12 LAB — CBC
HCT: 36.3 % (ref 36.0–46.0)
Hemoglobin: 11.8 g/dL — ABNORMAL LOW (ref 12.0–15.0)
MCH: 26.3 pg (ref 26.0–34.0)
MCHC: 32.5 g/dL (ref 30.0–36.0)
MCV: 81 fL (ref 80.0–100.0)
Platelets: 143 10*3/uL — ABNORMAL LOW (ref 150–400)
RBC: 4.48 MIL/uL (ref 3.87–5.11)
RDW: 14.8 % (ref 11.5–15.5)
WBC: 10.6 10*3/uL — ABNORMAL HIGH (ref 4.0–10.5)
nRBC: 0 % (ref 0.0–0.2)

## 2022-08-12 MED ORDER — POTASSIUM CHLORIDE 20 MEQ PO PACK
40.0000 meq | PACK | Freq: Two times a day (BID) | ORAL | Status: AC
  Administered 2022-08-12 (×2): 40 meq via ORAL
  Filled 2022-08-12 (×2): qty 2

## 2022-08-12 NOTE — Progress Notes (Signed)
[facility] Progress Note  3 Days Post-Op  Subjective: CC-  Up in chair. Denies worsening abdominal pain. 1 episode of emesis yesterday morning, none since then. Nausea is chronic but she is also belching some. Tolerating sips of clear liquids, states that the Boost Breeze on ice actually goes down the easiest of any clear liquid. Creatinine normalized, good UOP. BP improved.  Objective: Vital signs in last 24 hours: Temp:  [98 F (36.7 C)-99.8 F (37.7 C)] 98.1 F (36.7 C) (04/05 0724) Pulse Rate:  [62-82] 65 (04/05 0724) Resp:  [16-18] 16 (04/05 0724) BP: (99-137)/(57-71) 121/59 (04/05 0724) SpO2:  [93 %-99 %] 99 % (04/05 0724) Last BM Date : 08/08/22  Intake/Output from previous day: 04/04 0701 - 04/05 0700 In: 930 [P.O.:930] Out: 1655 [Urine:1600; Emesis/NG output:40; Drains:15] Intake/Output this shift: No intake/output data recorded.  PE: Gen:  Alert, NAD, pleasant Card:  RRR Pulm:  CTAB, no W/R/R, rate and effort normal on [place] Abd: Soft, nondistended, appropriately tender, incisions cdi. JP serosanguinous  Lab Results:  Recent Labs    08/10/22 1154 08/11/22 0407 08/12/22 0249  WBC 7.5  --  10.6*  HGB 12.4 11.8* 11.8*  HCT 37.6  --  36.3  PLT 185  --  143*   BMET Recent Labs    08/10/22 1154 08/11/22 0407 08/12/22 0249  NA 138  --  136  K 3.6 3.9 3.4*  CL 99  --  103  CO2 26  --  25  GLUCOSE 96  --  149*  BUN 20  --  16  CREATININE 1.10* 2.16* 0.93  CALCIUM 9.1  --  8.9   PT/INR No results for input(s): "LABPROT", "INR" in the last 72 hours. CMP     Component Value Date/Time   NA 136 08/12/2022 0249   NA 141 01/03/2022 1454   K 3.4 (L) 08/12/2022 0249   CL 103 08/12/2022 0249   CO2 25 08/12/2022 0249   GLUCOSE 149 (H) 08/12/2022 0249   BUN 16 08/12/2022 0249   BUN 10 01/03/2022 1454   CREATININE 0.93 08/12/2022 0249   CREATININE 1.01 (H) 06/01/2022 1412   CALCIUM 8.9 08/12/2022 0249   PROT 6.9 08/03/2022 1735   ALBUMIN 3.5  08/03/2022 1735   AST 27 08/03/2022 1735   AST 21 06/01/2022 1412   ALT 20 08/03/2022 1735   ALT 15 06/01/2022 1412   ALKPHOS 146 (H) 08/03/2022 1735   BILITOT 0.3 08/03/2022 1735   BILITOT 0.1 (L) 06/01/2022 1412   GFRNONAA >60 08/12/2022 0249   GFRNONAA >60 06/01/2022 1412   GFRAA 61 03/13/2020 1140   GFRAA >60 02/25/2019 1438   Lipase  No results found for: "LIPASE"     Studies/Results: No results found.  Anti-infectives: Anti-infectives (From admission, onward)    Start     Dose/Rate Route Frequency Ordered Stop   08/09/22 1045  cefTRIAXone (ROCEPHIN) 2 g in sodium chloride 0.9 % 100 mL IVPB        2 g 200 mL/hr over 30 Minutes Intravenous On call to O.R. 08/09/22 1033 08/09/22 1346   08/09/22 0600  cefoTEtan (CEFOTAN) 2 g in sodium chloride 0.9 % 100 mL IVPB        2 g 200 mL/hr over 30 Minutes Intravenous On call to O.R. 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 ROBOTIC reduction of paraesophageal hiatal hernia 2.  Type II mediastinal dissection. 3. Primary repair of hiatal hernia over pledgets.  4. Anterior & posterior gastropexy. 5. Toupet (270 degree partial posterior x 4cm)  fundoplication 6. Mesh reinforcement with absorbable mesh - Esophagram 4/3 with very tight wrap. Plan was to do lasix/decadron x3 days and repeat e-gram in 3 days. AKI worsened so lasix was stopped. Cr improved today. Continue IVF @ 50cc/hr. Continue decadron. Plan for repeat Esophagram 4/6 (I called and spoke to radiology to ensure this gets done on a weekend) - Continue CLD as tolerated. Replace K. - d/c JP drain - Continue mobilizing, PT/OT. Recommending continuing outpatient therapies - Pulm toilet, IS/flutter valve. Continue to wean supplemental O2 - continue PPI - k pad for neck pain   ID - abx periop FEN - IVF@50cc /hr, CLD, Boost breeze VTE - sq heparin Foley - none    HTN HLD GERD Anxiety/depression Fibromyalgia, chronic pain CKD Nonobstructive CAD IDA Incidental finding: pulmonary nodule, follow up PCP   LOS: 9 days    Franne FortsBrooke A Clennon Nasca, [facility] 08/12/2022, 10:30 AM Please see Amion for pager number during day hours 7:00am-4:30pm

## 2022-08-12 NOTE — Progress Notes (Signed)
Pt refused dulcolax suppository

## 2022-08-12 NOTE — Progress Notes (Signed)
Physical Therapy Treatment Patient Details Name: [name] MRN: [id number] DOB: 1955/09/04 Today's Date: 08/12/2022   History of Present Illness 67 y.o. female presents to [facility] 3/27 hospital with LUQ pain and vomiting, referred for management of hiatal hernia. Pt underwent robotic assisted hernia repair on 4/2. PMH includes anxiety, anemia, CAD, chronic back pain, CKD, depression, HTN.    PT Comments    Tolerated treatment very well today. Successfully completed stair training with patient and husband assisting at a min guard level with set-up similar to home entrance. Ambulated on RA with SpO2 99% towards end of distance, using rollator for support similar to baseline. Pt motiviated, agreeable to HHPT until she can get easily get to and from OPPT.  Patient will continue to benefit from skilled physical therapy services to further improve independence with functional mobility. Has 24/7 assist from husband available at d/c.   Recommendations for follow up therapy are one component of a multi-disciplinary discharge planning process, led by the attending physician.  Recommendations may be updated based on patient status, additional functional criteria and insurance authorization.  Follow Up Recommendations   HHPT    Assistance Recommended at Discharge Intermittent Supervision/Assistance  Patient can return home with the following A little help with walking and/or transfers;A little help with bathing/dressing/bathroom;Assistance with cooking/housework;Help with stairs or ramp for entrance;Assist for transportation   Equipment Recommendations  None recommended by PT    Recommendations for Other Services       Precautions / Restrictions Precautions Precautions: Fall Restrictions Weight Bearing Restrictions: No     Mobility  Bed Mobility               General bed mobility comments: sitting in chair, able to scoot forward/backwards as needed    Transfers Overall transfer  level: Needs assistance Equipment used: Rollator (4 wheels) Transfers: Sit to/from Stand Sit to Stand: Supervision           General transfer comment: Supervision for safety. Cues for technique with transitions.    Ambulation/Gait Ambulation/Gait assistance: Supervision Gait Distance (Feet): 110 Feet Assistive device: Rollator (4 wheels) Gait Pattern/deviations: Step-through pattern, Decreased stride length, Trunk flexed Gait velocity: decr Gait velocity interpretation: <1.8 ft/sec, indicate of risk for recurrent falls   General Gait Details: Mildly guarded with gait, using rollator today, good foot clearance without evidence of buckling or obvious loss of balance using this device. SpO2 2L 96%, and later 99% on RA without dypsnea or complaints of SOB.   Stairs Stairs: Yes Stairs assistance: Min guard Stair Management: One rail Left, Step to pattern, Sideways Number of Stairs: 7 General stair comments: Educated patient and husband on stair navigation techniques and guarding, similar to home environment. Safely navigated slowly but without LOB or buckling using single rail to climb/descend steps. Both feel confident with this task.   Wheelchair Mobility    Modified Rankin (Stroke Patients Only)       Balance Overall balance assessment: Needs assistance Sitting-balance support: No upper extremity supported, Feet supported Sitting balance-Leahy Scale: Good     Standing balance support: Reliant on assistive device for balance, Single extremity supported Standing balance-Leahy Scale: Poor                              Cognition Arousal/Alertness: Awake/alert Behavior During Therapy: WFL for tasks assessed/performed Overall Cognitive Status: Within Functional Limits for tasks assessed  Exercises      General Comments General comments (skin integrity, edema, etc.): SpO2 Ambualting on 2L 96%.  Removed and ambulated further distance on RA, 99%. RN notified. Husband present, very supportive      Pertinent Vitals/Pain Pain Assessment Pain Assessment: Faces Faces Pain Scale: Hurts even more Breathing: normal Pain Location: Abdomen, L leg Pain Descriptors / Indicators: Aching, Spasm Pain Intervention(s): Monitored during session, Repositioned    Home Living                          Prior Function            PT Goals (current goals can now be found in the care plan section) Acute Rehab PT Goals Patient Stated Goal: Get well PT Goal Formulation: With patient/family Time For Goal Achievement: 08/25/22 Potential to Achieve Goals: Good Progress towards PT goals: Progressing toward goals    Frequency    Min 3X/week      PT Plan Current plan remains appropriate    Co-evaluation              AM-PAC PT "6 Clicks" Mobility   Outcome Measure  Help needed turning from your back to your side while in a flat bed without using bedrails?: A Little Help needed moving from lying on your back to sitting on the side of a flat bed without using bedrails?: A Little Help needed moving to and from a bed to a chair (including a wheelchair)?: A Little Help needed standing up from a chair using your arms (e.g., wheelchair or bedside chair)?: A Little Help needed to walk in hospital room?: A Little Help needed climbing 3-5 steps with a railing? : A Little 6 Click Score: 18    End of Session Equipment Utilized During Treatment: Gait belt;Oxygen Activity Tolerance: Patient tolerated treatment well Patient left: in chair;with call bell/phone within reach;with chair alarm set;with family/visitor present Nurse Communication: Mobility status;Other (comment) (O2 weaned to RA, 99% while ambulating.) PT Visit Diagnosis: Unsteadiness on feet (R26.81);Other abnormalities of gait and mobility (R26.89);History of falling (Z91.81);Muscle weakness (generalized) (M62.81);Difficulty  in walking, not elsewhere classified (R26.2);Pain Pain - Right/Left: Left Pain - part of body: Leg (Abdomen)     Time: [id number] PT Time Calculation (min) (ACUTE ONLY): 32 min  Charges:  $Gait Training: 8-22 mins $Therapeutic Activity: 8-22 mins                     Kathlyn SacramentoLogan Shervon Kerwin, PT, DPT Physical Therapist Acute Rehabilitation Services [facility]    [place] [name] 08/12/2022, 12:40 PM

## 2022-08-12 NOTE — Plan of Care (Signed)
  Problem: Nutrition: Goal: Adequate nutrition will be maintained Outcome: Not Progressing   Problem: Elimination: Goal: Will not experience complications related to bowel motility Outcome: Not Progressing   Problem: Safety: Goal: Ability to remain free from injury will improve Outcome: Not Progressing   Problem: Pain Managment: Goal: General experience of comfort will improve Outcome: Not Progressing

## 2022-08-12 NOTE — Progress Notes (Signed)
JP drain removed from abdomen per order. Pt tp;erated well. PRN dilaudid given. Patient resting comfortably in chair. Husband in room. All needs met at this time. Will continue to monitor pt.

## 2022-08-13 LAB — CBC
HCT: 32.8 % — ABNORMAL LOW (ref 36.0–46.0)
Hemoglobin: 10.4 g/dL — ABNORMAL LOW (ref 12.0–15.0)
MCH: 25.9 pg — ABNORMAL LOW (ref 26.0–34.0)
MCHC: 31.7 g/dL (ref 30.0–36.0)
MCV: 81.6 fL (ref 80.0–100.0)
Platelets: 136 10*3/uL — ABNORMAL LOW (ref 150–400)
RBC: 4.02 MIL/uL (ref 3.87–5.11)
RDW: 14.7 % (ref 11.5–15.5)
WBC: 8.2 10*3/uL (ref 4.0–10.5)
nRBC: 0 % (ref 0.0–0.2)

## 2022-08-13 LAB — BASIC METABOLIC PANEL
Anion gap: 8 (ref 5–15)
Anion gap: 8 (ref 5–15)
BUN: 16 mg/dL (ref 8–23)
BUN: 15 mg/dL (ref 8–23)
CO2: 25 mmol/L (ref 22–32)
CO2: 24 mmol/L (ref 22–32)
Calcium: 8.9 mg/dL (ref 8.9–10.3)
Calcium: 9.1 mg/dL (ref 8.9–10.3)
Chloride: 103 mmol/L (ref 98–111)
Chloride: 104 mmol/L (ref 98–111)
Creatinine, Ser: 0.93 mg/dL (ref 0.44–1.00)
Creatinine, Ser: 0.81 mg/dL (ref 0.44–1.00)
GFR, Estimated: >60 mL/min (ref >60)
GFR, Estimated: >60 mL/min (ref >60)
Glucose, Bld: 149 mg/dL — ABNORMAL HIGH (ref 70–99)
Glucose, Bld: 110 mg/dL — ABNORMAL HIGH (ref 70–99)
Potassium: 3.4 mmol/L — ABNORMAL LOW (ref 3.5–5.1)
Potassium: 4.2 mmol/L (ref 3.5–5.1)
Sodium: 136 mmol/L (ref 135–145)
Sodium: 136 mmol/L (ref 135–145)

## 2022-08-13 LAB — MAGNESIUM: Magnesium: 2.2 mg/dL (ref 1.7–2.4)

## 2022-08-13 NOTE — Progress Notes (Signed)
Patient's husband said she finally ate a full meal at lunch. With no issues.

## 2022-08-13 NOTE — Progress Notes (Addendum)
4 Days Post-Op   Subjective/Chief Complaint: Nausea better but still having, tol some liquids, having bowel function, due for swallow today   Objective: Vital signs in last 24 hours: Temp:  [97.5 F (36.4 C)-98.5 F (36.9 C)] 98.5 F (36.9 C) (04/06 0731) Pulse Rate:  [53-81] 59 (04/06 0731) Resp:  [14-18] 16 (04/06 0731) BP: (106-117)/(48-67) 107/62 (04/06 0731) SpO2:  [90 %-92 %] 90 % (04/06 0731) Last BM Date : 08/08/22  Intake/Output from previous day: 04/05 0701 - 04/06 0700 In: 923.5 [P.O.:390; I.V.:57; IV Piggyback:476.5] Out: 1300 [Urine:1300] Intake/Output this shift: No intake/output data recorded.  Gen:  Alert, NAD, pleasant CV regular Pulm:  effort normal Abd: Soft, nondistended, appropriately tender, incisions cdi. There is stitch at JP site that will need to come out at some point  Lab Results:  Recent Labs    08/12/22 0249 08/13/22 0208  WBC 10.6* 8.2  HGB 11.8* 10.4*  HCT 36.3 32.8*  PLT 143* 136*   BMET Recent Labs    08/12/22 0249 08/13/22 0208  NA 136 136  K 3.4* 4.2  CL 103 104  CO2 25 24  GLUCOSE 149* 110*  BUN 16 15  CREATININE 0.93 0.81  CALCIUM 8.9 9.1   PT/INR No results for input(s): "LABPROT", "INR" in the last 72 hours. ABG No results for input(s): "PHART", "HCO3" in the last 72 hours.  Invalid input(s): "PCO2", "PO2"  Studies/Results: No results found.  Anti-infectives: Anti-infectives (From admission, onward)    Start     Dose/Rate Route Frequency Ordered Stop   08/09/22 1045  cefTRIAXone (ROCEPHIN) 2 g in sodium chloride 0.9 % 100 mL IVPB        2 g 200 mL/hr over 30 Minutes Intravenous On call to O.R. 08/09/22 1033 08/09/22 1346   08/09/22 0600  cefoTEtan (CEFOTAN) 2 g in sodium chloride 0.9 % 100 mL IVPB        2 g 200 mL/hr over 30 Minutes Intravenous On call to O.R. 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 Plan was to do lasix/decadron x3 days and repeat e-gram in 3 days. AKI worsened so lasix was stopped. Cr normal now. Continue decadron. Plan for repeat Esophagram 4/7 as discussed with radiology - Continue CLD as tolerated.  - Continue mobilizing, PT/OT. Recommending continuing outpatient therapies - Pulm toilet, IS/flutter valve. - continue PPI - k pad for neck pain   ID - abx periop FEN -  CLD, Boost breeze VTE - sq heparin Foley - none   HTN HLD GERD Anxiety/depression Fibromyalgia, chronic pain CKD Nonobstructive CAD IDA Incidental finding: pulmonary nodule, follow up PCP    Emelia Loron 08/13/2022

## 2022-08-13 NOTE — Progress Notes (Signed)
Mobility Specialist Progress Note    08/13/22 1130  Mobility  Activity Ambulated with assistance in hallway  Level of Assistance Contact guard assist, steadying assist  Assistive Device Four wheel walker  Distance Ambulated (ft) 340 ft (290+50)  Activity Response Tolerated well  Mobility Referral Yes  $Mobility charge 1 Mobility   Pt received in chair and agreeable. No complaints. Took x1 extended seated rest break. Returned to chair with call bell in reach.  [place] Mobility Specialist  Please Neurosurgeon or Rehab Office at [phone]

## 2022-08-14 ENCOUNTER — Inpatient Hospital Stay (HOSPITAL_COMMUNITY): Payer: Medicare Other

## 2022-08-14 MED ORDER — BISACODYL 10 MG RE SUPP: 10.0000 mg | Freq: Every day | RECTAL | Status: DC | PRN

## 2022-08-14 NOTE — Progress Notes (Signed)
5 Days Post-Op   Subjective/Chief Complaint: Episode of pain overnight resolved, tol liquids well yesterday, oob   Objective: Vital signs in last 24 hours: Temp:  [97.9 F (36.6 C)-98.8 F (37.1 C)] 98 F (36.7 C) (04/07 0839) Pulse Rate:  [50-61] 52 (04/07 0839) Resp:  [15-20] 15 (04/07 0320) BP: (94-125)/(53-78) 120/69 (04/07 0839) SpO2:  [93 %-96 %] 96 % (04/07 0839) Last BM Date : 08/13/22  Intake/Output from previous day: 04/06 0701 - 04/07 0700 In: 240 [P.O.:240] Out: 1600 [Urine:1600] Intake/Output this shift: No intake/output data recorded.  Gen:  Alert, NAD, pleasant CV regular Pulm:  effort normal Abd: Soft, nondistended, appropriately tender, incisions cdi. There is stitch at JP site that will need to come out at some point  Lab Results:  Recent Labs    08/12/22 0249 08/13/22 0208  WBC 10.6* 8.2  HGB 11.8* 10.4*  HCT 36.3 32.8*  PLT 143* 136*   BMET Recent Labs    08/12/22 0249 08/13/22 0208  NA 136 136  K 3.4* 4.2  CL 103 104  CO2 25 24  GLUCOSE 149* 110*  BUN 16 15  CREATININE 0.93 0.81  CALCIUM 8.9 9.1   PT/INR No results for input(s): "LABPROT", "INR" in the last 72 hours. ABG No results for input(s): "PHART", "HCO3" in the last 72 hours.  Invalid input(s): "PCO2", "PO2"  Studies/Results: No results found.  Anti-infectives: Anti-infectives (From admission, onward)    Start     Dose/Rate Route Frequency Ordered Stop   08/09/22 1045  cefTRIAXone (ROCEPHIN) 2 g in sodium chloride 0.9 % 100 mL IVPB        2 g 200 mL/hr over 30 Minutes Intravenous On call to O.R. 08/09/22 1033 08/09/22 1346   08/09/22 0600  cefoTEtan (CEFOTAN) 2 g in sodium chloride 0.9 % 100 mL IVPB        2 g 200 mL/hr over 30 Minutes Intravenous On call to O.R. 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  Plan was to do lasix/decadron x3 days and repeat e-gram in 3 days. AKI worsened so lasix was stopped. Cr normal now. Dc decadron. Plan for repeat Esophagram 4/7 as discussed with radiology - Continue CLD as tolerated.  - Continue mobilizing, PT/OT. Recommending continuing outpatient therapies - Pulm toilet, IS/flutter valve. - continue PPI   ID - abx periop FEN -  CLD, Boost breeze VTE - sq heparin Foley - none   HTN HLD GERD Anxiety/depression Fibromyalgia, chronic pain CKD Nonobstructive CAD IDA Incidental finding: pulmonary nodule, follow up PCP   [name] 08/14/2022

## 2022-08-14 NOTE — Plan of Care (Signed)

## 2022-08-14 NOTE — Progress Notes (Addendum)
Pt complains epigastric pain, nausea and muscle spasm over left lower extremities. Took vitals pt remain 0 MEWS. PRN Zanaflex, dilaudid and zofran given. Instructed the pt NPO until MD seen. Will continue to monitor.

## 2022-08-15 ENCOUNTER — Other Ambulatory Visit (HOSPITAL_COMMUNITY): Payer: Self-pay

## 2022-08-15 ENCOUNTER — Encounter: Payer: Self-pay | Admitting: Hematology

## 2022-08-15 MED ORDER — PANTOPRAZOLE SODIUM 40 MG PO TBEC
40.0000 mg | DELAYED_RELEASE_TABLET | Freq: Two times a day (BID) | ORAL | 0 refills | Status: DC
  Filled 2022-08-15: qty 60, 30d supply, fill #0

## 2022-08-15 MED ORDER — GABAPENTIN 400 MG PO CAPS
400.0000 mg | ORAL_CAPSULE | Freq: Three times a day (TID) | ORAL | 0 refills | Status: AC
  Filled 2022-08-15: qty 90, 30d supply, fill #0

## 2022-08-15 MED ORDER — DULOXETINE HCL 20 MG PO CPEP
40.0000 mg | ORAL_CAPSULE | Freq: Two times a day (BID) | ORAL | 0 refills | Status: AC
  Filled 2022-08-15: qty 30, 8d supply, fill #0

## 2022-08-15 MED ORDER — HYDROCODONE-ACETAMINOPHEN 10-325 MG PO TABS
1.0000 | ORAL_TABLET | ORAL | 0 refills | Status: AC | PRN
  Filled 2022-08-15: qty 30, 7d supply, fill #0

## 2022-08-15 MED ORDER — ALBUTEROL SULFATE HFA 108 (90 BASE) MCG/ACT IN AERS: 2.0000 | INHALATION_SPRAY | RESPIRATORY_TRACT | Status: AC | PRN

## 2022-08-15 MED ORDER — ACETAMINOPHEN 500 MG PO TABS
500.0000 mg | ORAL_TABLET | Freq: Four times a day (QID) | ORAL | 0 refills | Status: AC | PRN
  Filled 2022-08-15: qty 30, 8d supply, fill #0

## 2022-08-15 MED ORDER — METOCLOPRAMIDE HCL 10 MG PO TABS
10.0000 mg | ORAL_TABLET | Freq: Four times a day (QID) | ORAL | 0 refills | Status: DC | PRN
  Filled 2022-08-15: qty 10, 3d supply, fill #0

## 2022-08-15 MED ORDER — TIZANIDINE HCL 4 MG PO TABS: 4.0000 mg | ORAL_TABLET | Freq: Two times a day (BID) | ORAL | Status: DC | PRN

## 2022-08-15 NOTE — Progress Notes (Signed)
Discharge instructions reviewed with pt and her husband.  Copy of instructions given to pt. Scripts being filled by [facility] and will be delivered to pt's room or picked up at the [facility] on the way out.   Pt to be d/c'd via wheelchair with belongings, with husband.        To be escorted by staff or hospital volunteer.

## 2022-08-15 NOTE — Care Management Important Message (Signed)
Important Message  Patient Details  Name: [name] MRN: [id number] Date of Birth: 1955-06-07   Medicare Important Message Given:  Yes     Sherilyn Banker 08/15/2022, 3:32 PM

## 2022-08-15 NOTE — Discharge Summary (Signed)
[facility] Discharge Summary   Patient ID: [name] MRN: [id number] DOB/AGE: 69/12/1955 69 y.o.  Admit date: 08/03/2022 Discharge date: 08/15/2022  Admitting Diagnosis: Hiatal hernia with possible gastric volvulus   Discharge Diagnosis Large hiatal hernia without gastric volvulus S/p robotic repair of hiatal hernia   Consultants Cardiology   Imaging: DG ESOPHAGUS W SINGLE CM (SOL OR THIN BA)  Result Date: 08/14/2022 CLINICAL DATA:  Recent incarcerated hiatal hernia repair 08/09/2022. Patient with poor PO tolerance after procedure due to tight fundoplication observed during esophagram 08/10/2022. Her pain has improved and she has been trial clear liquids with tolerance. Repeat barium swallow requested. EXAM: ESOPHAGRAM TECHNIQUE: Single contrast examination was performed using Omnipaque 300. This exam was performed by Loyce Dys PA-C and supervised and interpreted by Myles Rosenthal, MD. FLUOROSCOPY: Radiation Exposure Index (as provided by the fluoroscopic device): 29.4 mGy Kerma COMPARISON:  Esophagram on 08/10/22 FINDINGS: No evidence of esophageal mass. Extrinsic impression is seen at the GE junction, consistent with fundoplication wrap. No evidence of recurrent hiatal hernia. Moderate smooth, beak-like narrowing is seen at the GE junction, with delayed passage of contrast into the stomach. However, this shows improvement since prior exam, likely due to resolving postop edema. No evidence of contrast leak or extravasation. IMPRESSION: Interval improvement in moderate, beak-like narrowing at the GE junction/fundoplication site and improved passage of contrast into the stomach, likely due to decreased postop edema. No evidence of contrast leak or extravasation. Electronically Signed   By: Danae Orleans M.D.   On: 08/14/2022 13:56    Procedures Dr. Karie Soda (08/09/22) -  1. ROBOTIC reduction of paraesophageal hiatal hernia 2. Type II mediastinal dissection. 3. Primary  repair of hiatal hernia over pledgets.  4. Anterior & posterior gastropexy. 5. Toupet (270 degree partial posterior x 4cm)  fundoplication 6. Mesh reinforcement with absorbable mesh  Hospital Course:  Patient is a 67 year old female who presented to [facility] office with known hiatal hernia and concern for worsening symptoms. Patient was admitted for evaluate with concern for possible gastric volvulus. Imaging showed large hiatal hernia without volvulus. Cardiology was consulted for surgical clearance. She underwent procedure listed above. Tolerated procedure well and was transferred to the floor.  Initial post-op esophagram showed tight wrap, patient treated with steroids and repeat esophagram showed improvement in swelling. On POD6, the patient was voiding well, tolerating diet, ambulating well, pain well controlled, vital signs stable, incisions c/d/i and felt stable for discharge home.  Patient will follow up in our office in 3 weeks and knows to call with questions or concerns. She will call to confirm appointment date/time.    Physical Exam: General:  Alert, NAD, pleasant, comfortable Abd:  Soft, ND, mild tenderness, incisions C/D/I  I or a member of my team have reviewed this patient in the Controlled Substance Database.   Allergies as of 08/15/2022       Reactions   Asa [aspirin] Nausea Only   Stomach upset   Ditropan [oxybutynin] Rash   Shellfish Allergy Swelling, Rash        Medication List     TAKE these medications    acetaminophen 500 MG tablet Commonly known as: TYLENOL Take 1 tablet (500 mg total) by mouth every 6 (six) hours as needed for mild pain or fever.   albuterol 108 (90 Base) MCG/ACT inhaler Commonly known as: VENTOLIN HFA Inhale 2 puffs into the lungs every 4 (four) hours as needed for wheezing or shortness of breath.   ALPRAZolam  1 MG tablet Commonly known as: XANAX Take 1 tablet by mouth 3 (three) times daily as needed for anxiety.   amLODipine 5 MG  tablet Commonly known as: NORVASC Take 1 tablet (5 mg total) by mouth daily.   baclofen 10 MG tablet Commonly known as: LIORESAL Take 10 mg by mouth 3 (three) times daily as needed for muscle spasms.   DULoxetine 20 MG capsule Commonly known as: CYMBALTA Take 2 capsules (40 mg total) by mouth 2 (two) times daily. What changed: how much to take   EXCEDRIN ASPIRIN FREE PO Take 1-2 tablets by mouth daily as needed (pain, headache).   famotidine 20 MG tablet Commonly known as: PEPCID Take 20 mg by mouth 2 (two) times daily.   gabapentin 400 MG capsule Commonly known as: NEURONTIN Take 1 capsule (400 mg total) by mouth 3 (three) times daily. What changed:  medication strength how much to take   HYDROcodone-acetaminophen 10-325 MG tablet Commonly known as: NORCO Take 1 tablet by mouth every 4 (four) hours as needed for moderate pain or severe pain. What changed:  when to take this reasons to take this   lisinopril 5 MG tablet Commonly known as: ZESTRIL Take 5 mg by mouth daily.   meloxicam 15 MG tablet Commonly known as: MOBIC Take 15 mg by mouth daily.   metoCLOPramide 10 MG tablet Commonly known as: Reglan Take 1 tablet (10 mg total) by mouth every 6 (six) hours as needed for nausea (nausea / vomiting).   nitroGLYCERIN 0.4 MG SL tablet Commonly known as: NITROSTAT Place 1 tablet (0.4 mg total) under the tongue every 5 (five) minutes x 3 doses as needed for chest pain.   omeprazole 20 MG capsule Commonly known as: PRILOSEC Take 20 mg by mouth 2 (two) times daily.   pantoprazole 40 MG tablet Commonly known as: PROTONIX Take 1 tablet (40 mg total) by mouth 2 (two) times daily.   potassium chloride 10 MEQ CR capsule Commonly known as: MICRO-K Take 10 mEq by mouth 2 (two) times daily.   promethazine 25 MG tablet Commonly known as: PHENERGAN Take 12.5-25 mg by mouth every 8 (eight) hours as needed for nausea or vomiting.   rosuvastatin 20 MG tablet Commonly  known as: CRESTOR TAKE 1 TABLET BY MOUTH ONCE DAILY . APPOINTMENT REQUIRED FOR FUTURE REFILLS What changed: See the new instructions.   tamsulosin 0.4 MG Caps capsule Commonly known as: FLOMAX Take 0.4 mg by mouth daily.   tiZANidine 4 MG tablet Commonly known as: ZANAFLEX Take 1 tablet (4 mg total) by mouth 2 (two) times daily as needed for muscle spasms.          Follow-up Information     Georgann HousekeeperHusain, Karrar, MD Follow up.   Specialty: Internal Medicine Contact information: [address] [place] [address] [phone]         Karie SodaGross, Steven, MD. Go on 09/05/2022.   Specialties: General Surgery, Colon and Rectal Surgery Why: 1:30 PM for post-op visit. Please arrive 30 min prior to appointment time. Contact information: [address] [place] [address] [phone]         [facility] Location. Schedule an appointment as soon as possible for a visit.   Contact information: [address], [place], [place] [address]   phone [phone]                Signed: Juliet RudeKelly R Jye Fariss , [facility]  08/15/2022, 11:13 AM Please see Amion for pager number during day hours 7:00am-4:30pm

## 2022-08-15 NOTE — Progress Notes (Signed)
Physical Therapy Treatment Patient Details Name: [name] MRN: [id number] DOB: 1956/04/08 Today's Date: 08/15/2022   History of Present Illness 67 y.o. female presents to [facility] 3/27 hospital with LUQ pain and vomiting, referred for management of hiatal hernia. Pt underwent robotic assisted hernia repair on 4/2. PMH includes anxiety, anemia, CAD, chronic back pain, CKD, depression, HTN.    PT Comments    Great progress. Safely navigates stairs at supervision level, set-up similar to home environment, good recall with sequencing. Able to ambulate 400 feet at supervision level with rollator, nearly mod I level with improved gait symmetry. Good family support at home. Adequate for d/c from mobility standpoint when medically ready. Updated d/c recs to OPPT rather than HHPT with the great progress she has made, pt and husband both agree this would be a better option at d/c. All questions answered. Will follow and progress until d/c.    Recommendations for follow up therapy are one component of a multi-disciplinary discharge planning process, led by the attending physician.  Recommendations may be updated based on patient status, additional functional criteria and insurance authorization.  Follow Up Recommendations    OPPT    Assistance Recommended at Discharge Set up Supervision/Assistance  Patient can return home with the following A little help with walking and/or transfers;A little help with bathing/dressing/bathroom;Assistance with cooking/housework;Help with stairs or ramp for entrance;Assist for transportation   Equipment Recommendations  None recommended by PT       Precautions / Restrictions Precautions Precautions: Fall Restrictions Weight Bearing Restrictions: No     Mobility  Bed Mobility               General bed mobility comments: sitting in chair, able to scoot forward/backwards as needed    Transfers Overall transfer level: Needs assistance Equipment used:  Rollator (4 wheels) Transfers: Sit to/from Stand Sit to Stand: Supervision           General transfer comment: Supervision for safety. Cues for technique with transitions. Slow to rise and reliant on bracing Rollator against chair.    Ambulation/Gait Ambulation/Gait assistance: Supervision Gait Distance (Feet): 400 Feet Assistive device: Rollator (4 wheels) Gait Pattern/deviations: Step-through pattern, Decreased stride length, Trunk flexed Gait velocity: decr Gait velocity interpretation: <1.8 ft/sec, indicate of risk for recurrent falls   General Gait Details: Improving upright posture with minimal cues. Focused on gait symmetry. Good control with rollator demonstrated. No buckling or overt LOB noted during bout.   Stairs Stairs: Yes Stairs assistance: Supervision Stair Management: One rail Left, Step to pattern, Sideways Number of Stairs: 13 General stair comments: Further training on stairs, pt feels better today and able to ascend at supervision level for safety from therapist. Completed full flight. Feeling mroe confident, no loss of balance or buckling. Cues for sequencing with good recall. Bil hands to rail   Wheelchair Mobility    Modified Rankin (Stroke Patients Only)       Balance Overall balance assessment: Needs assistance Sitting-balance support: No upper extremity supported, Feet supported Sitting balance-Leahy Scale: Good     Standing balance support: Reliant on assistive device for balance, Single extremity supported Standing balance-Leahy Scale: Poor                              Cognition Arousal/Alertness: Awake/alert Behavior During Therapy: WFL for tasks assessed/performed Overall Cognitive Status: Within Functional Limits for tasks assessed  Exercises      General Comments        Pertinent Vitals/Pain Pain Assessment Pain Assessment: Faces Faces Pain Scale: Hurts  a little bit Pain Location: Abdomen, L leg Pain Descriptors / Indicators: Aching, Spasm Pain Intervention(s): Monitored during session, Repositioned    Home Living                          Prior Function            PT Goals (current goals can now be found in the care plan section) Acute Rehab PT Goals Patient Stated Goal: Get well PT Goal Formulation: With patient/family Time For Goal Achievement: 08/25/22 Potential to Achieve Goals: Good Progress towards PT goals: Progressing toward goals    Frequency    Min 3X/week      PT Plan Discharge plan needs to be updated    Co-evaluation              AM-PAC PT "6 Clicks" Mobility   Outcome Measure  Help needed turning from your back to your side while in a flat bed without using bedrails?: A Little Help needed moving from lying on your back to sitting on the side of a flat bed without using bedrails?: A Little Help needed moving to and from a bed to a chair (including a wheelchair)?: A Little Help needed standing up from a chair using your arms (e.g., wheelchair or bedside chair)?: A Little Help needed to walk in hospital room?: A Little Help needed climbing 3-5 steps with a railing? : A Little 6 Click Score: 18    End of Session Equipment Utilized During Treatment: Gait belt Activity Tolerance: Patient tolerated treatment well Patient left: in chair;with call bell/phone within reach;with chair alarm set;with family/visitor present   PT Visit Diagnosis: Unsteadiness on feet (R26.81);Other abnormalities of gait and mobility (R26.89);History of falling (Z91.81);Muscle weakness (generalized) (M62.81);Difficulty in walking, not elsewhere classified (R26.2);Pain Pain - Right/Left: Left Pain - part of body: Leg (Abdomen)     Time: 3235-5732 PT Time Calculation (min) (ACUTE ONLY): 18 min  Charges:  $Gait Training: 8-22 mins                     Kathlyn Sacramento, PT, DPT Physical Therapist Acute  Rehabilitation Services [facility]    Berton Mount 08/15/2022, 11:00 AM

## 2022-09-02 DIAGNOSIS — M62830 Muscle spasm of back: Secondary | ICD-10-CM | POA: Diagnosis not present

## 2022-09-05 DIAGNOSIS — R1310 Dysphagia, unspecified: Secondary | ICD-10-CM | POA: Diagnosis not present

## 2022-09-05 DIAGNOSIS — I25119 Atherosclerotic heart disease of native coronary artery with unspecified angina pectoris: Secondary | ICD-10-CM | POA: Diagnosis not present

## 2022-09-08 DIAGNOSIS — M2560 Stiffness of unspecified joint, not elsewhere classified: Secondary | ICD-10-CM | POA: Diagnosis not present

## 2022-09-08 DIAGNOSIS — R2689 Other abnormalities of gait and mobility: Secondary | ICD-10-CM | POA: Diagnosis not present

## 2022-09-08 DIAGNOSIS — R208 Other disturbances of skin sensation: Secondary | ICD-10-CM | POA: Diagnosis not present

## 2022-09-08 DIAGNOSIS — R531 Weakness: Secondary | ICD-10-CM | POA: Diagnosis not present

## 2022-09-08 DIAGNOSIS — K440 Diaphragmatic hernia with obstruction, without gangrene: Secondary | ICD-10-CM | POA: Diagnosis not present

## 2022-09-08 DIAGNOSIS — M25611 Stiffness of right shoulder, not elsewhere classified: Secondary | ICD-10-CM | POA: Diagnosis not present

## 2022-09-08 DIAGNOSIS — R10814 Left lower quadrant abdominal tenderness: Secondary | ICD-10-CM | POA: Diagnosis not present

## 2022-09-08 DIAGNOSIS — R2681 Unsteadiness on feet: Secondary | ICD-10-CM | POA: Diagnosis not present

## 2022-09-08 DIAGNOSIS — M25612 Stiffness of left shoulder, not elsewhere classified: Secondary | ICD-10-CM | POA: Diagnosis not present

## 2022-09-13 DIAGNOSIS — M5416 Radiculopathy, lumbar region: Secondary | ICD-10-CM | POA: Diagnosis not present

## 2022-09-16 DIAGNOSIS — R531 Weakness: Secondary | ICD-10-CM | POA: Diagnosis not present

## 2022-09-16 DIAGNOSIS — R2681 Unsteadiness on feet: Secondary | ICD-10-CM | POA: Diagnosis not present

## 2022-09-16 DIAGNOSIS — M2560 Stiffness of unspecified joint, not elsewhere classified: Secondary | ICD-10-CM | POA: Diagnosis not present

## 2022-09-16 DIAGNOSIS — R10814 Left lower quadrant abdominal tenderness: Secondary | ICD-10-CM | POA: Diagnosis not present

## 2022-09-16 DIAGNOSIS — M25612 Stiffness of left shoulder, not elsewhere classified: Secondary | ICD-10-CM | POA: Diagnosis not present

## 2022-09-16 DIAGNOSIS — M25611 Stiffness of right shoulder, not elsewhere classified: Secondary | ICD-10-CM | POA: Diagnosis not present

## 2022-09-16 DIAGNOSIS — R208 Other disturbances of skin sensation: Secondary | ICD-10-CM | POA: Diagnosis not present

## 2022-09-16 DIAGNOSIS — K440 Diaphragmatic hernia with obstruction, without gangrene: Secondary | ICD-10-CM | POA: Diagnosis not present

## 2022-09-16 DIAGNOSIS — R2689 Other abnormalities of gait and mobility: Secondary | ICD-10-CM | POA: Diagnosis not present

## 2022-09-19 DIAGNOSIS — R2689 Other abnormalities of gait and mobility: Secondary | ICD-10-CM | POA: Diagnosis not present

## 2022-09-19 DIAGNOSIS — M2560 Stiffness of unspecified joint, not elsewhere classified: Secondary | ICD-10-CM | POA: Diagnosis not present

## 2022-09-19 DIAGNOSIS — R531 Weakness: Secondary | ICD-10-CM | POA: Diagnosis not present

## 2022-09-19 DIAGNOSIS — M25611 Stiffness of right shoulder, not elsewhere classified: Secondary | ICD-10-CM | POA: Diagnosis not present

## 2022-09-19 DIAGNOSIS — R208 Other disturbances of skin sensation: Secondary | ICD-10-CM | POA: Diagnosis not present

## 2022-09-19 DIAGNOSIS — R2681 Unsteadiness on feet: Secondary | ICD-10-CM | POA: Diagnosis not present

## 2022-09-19 DIAGNOSIS — M25612 Stiffness of left shoulder, not elsewhere classified: Secondary | ICD-10-CM | POA: Diagnosis not present

## 2022-09-19 DIAGNOSIS — K440 Diaphragmatic hernia with obstruction, without gangrene: Secondary | ICD-10-CM | POA: Diagnosis not present

## 2022-09-19 DIAGNOSIS — R10814 Left lower quadrant abdominal tenderness: Secondary | ICD-10-CM | POA: Diagnosis not present

## 2022-09-22 DIAGNOSIS — R531 Weakness: Secondary | ICD-10-CM | POA: Diagnosis not present

## 2022-09-22 DIAGNOSIS — R2689 Other abnormalities of gait and mobility: Secondary | ICD-10-CM | POA: Diagnosis not present

## 2022-09-22 DIAGNOSIS — K440 Diaphragmatic hernia with obstruction, without gangrene: Secondary | ICD-10-CM | POA: Diagnosis not present

## 2022-09-22 DIAGNOSIS — R208 Other disturbances of skin sensation: Secondary | ICD-10-CM | POA: Diagnosis not present

## 2022-09-22 DIAGNOSIS — M25611 Stiffness of right shoulder, not elsewhere classified: Secondary | ICD-10-CM | POA: Diagnosis not present

## 2022-09-22 DIAGNOSIS — R10814 Left lower quadrant abdominal tenderness: Secondary | ICD-10-CM | POA: Diagnosis not present

## 2022-09-22 DIAGNOSIS — R2681 Unsteadiness on feet: Secondary | ICD-10-CM | POA: Diagnosis not present

## 2022-09-22 DIAGNOSIS — M2560 Stiffness of unspecified joint, not elsewhere classified: Secondary | ICD-10-CM | POA: Diagnosis not present

## 2022-09-22 DIAGNOSIS — M25612 Stiffness of left shoulder, not elsewhere classified: Secondary | ICD-10-CM | POA: Diagnosis not present

## 2022-10-04 DIAGNOSIS — R2681 Unsteadiness on feet: Secondary | ICD-10-CM | POA: Diagnosis not present

## 2022-10-04 DIAGNOSIS — R208 Other disturbances of skin sensation: Secondary | ICD-10-CM | POA: Diagnosis not present

## 2022-10-04 DIAGNOSIS — M2560 Stiffness of unspecified joint, not elsewhere classified: Secondary | ICD-10-CM | POA: Diagnosis not present

## 2022-10-04 DIAGNOSIS — M25611 Stiffness of right shoulder, not elsewhere classified: Secondary | ICD-10-CM | POA: Diagnosis not present

## 2022-10-04 DIAGNOSIS — R2689 Other abnormalities of gait and mobility: Secondary | ICD-10-CM | POA: Diagnosis not present

## 2022-10-04 DIAGNOSIS — K440 Diaphragmatic hernia with obstruction, without gangrene: Secondary | ICD-10-CM | POA: Diagnosis not present

## 2022-10-04 DIAGNOSIS — M25612 Stiffness of left shoulder, not elsewhere classified: Secondary | ICD-10-CM | POA: Diagnosis not present

## 2022-10-04 DIAGNOSIS — R10814 Left lower quadrant abdominal tenderness: Secondary | ICD-10-CM | POA: Diagnosis not present

## 2022-10-04 DIAGNOSIS — R531 Weakness: Secondary | ICD-10-CM | POA: Diagnosis not present

## 2022-10-07 DIAGNOSIS — M5416 Radiculopathy, lumbar region: Secondary | ICD-10-CM | POA: Diagnosis not present

## 2022-10-16 ENCOUNTER — Emergency Department (HOSPITAL_COMMUNITY)
Admission: EM | Admit: 2022-10-16 | Discharge: 2022-10-16 | Disposition: A | Discharge status: Home / Self Care | Payer: Medicare Other | Attending: Emergency Medicine | Admitting: Emergency Medicine

## 2022-10-16 ENCOUNTER — Encounter (HOSPITAL_COMMUNITY): Payer: Self-pay | Admitting: Emergency Medicine

## 2022-10-16 ENCOUNTER — Emergency Department (HOSPITAL_COMMUNITY): Payer: Medicare Other

## 2022-10-16 ENCOUNTER — Other Ambulatory Visit: Payer: Self-pay

## 2022-10-16 DIAGNOSIS — R079 Chest pain, unspecified: Secondary | ICD-10-CM | POA: Insufficient documentation

## 2022-10-16 DIAGNOSIS — R0789 Other chest pain: Secondary | ICD-10-CM | POA: Diagnosis not present

## 2022-10-16 DIAGNOSIS — I700 Atherosclerosis of aorta: Secondary | ICD-10-CM | POA: Diagnosis not present

## 2022-10-16 DIAGNOSIS — K3189 Other diseases of stomach and duodenum: Secondary | ICD-10-CM | POA: Diagnosis not present

## 2022-10-16 DIAGNOSIS — R110 Nausea: Secondary | ICD-10-CM | POA: Insufficient documentation

## 2022-10-16 DIAGNOSIS — Z743 Need for continuous supervision: Secondary | ICD-10-CM | POA: Diagnosis not present

## 2022-10-16 DIAGNOSIS — N189 Chronic kidney disease, unspecified: Secondary | ICD-10-CM | POA: Diagnosis not present

## 2022-10-16 DIAGNOSIS — R1013 Epigastric pain: Secondary | ICD-10-CM | POA: Diagnosis not present

## 2022-10-16 DIAGNOSIS — R109 Unspecified abdominal pain: Secondary | ICD-10-CM | POA: Diagnosis present

## 2022-10-16 DIAGNOSIS — I4891 Unspecified atrial fibrillation: Secondary | ICD-10-CM | POA: Insufficient documentation

## 2022-10-16 DIAGNOSIS — R112 Nausea with vomiting, unspecified: Secondary | ICD-10-CM | POA: Diagnosis not present

## 2022-10-16 LAB — I-STAT CHEM 8, ED
BUN: 19 mg/dL (ref 8–23)
Calcium, Ion: 1.09 mmol/L — ABNORMAL LOW (ref 1.15–1.40)
Chloride: 102 mmol/L (ref 98–111)
Creatinine, Ser: 1.2 mg/dL — ABNORMAL HIGH (ref 0.44–1.00)
Glucose, Bld: 100 mg/dL — ABNORMAL HIGH (ref 70–99)
HCT: 44 % (ref 36.0–46.0)
Hemoglobin: 15 g/dL (ref 12.0–15.0)
Potassium: 4.1 mmol/L (ref 3.5–5.1)
Sodium: 134 mmol/L — ABNORMAL LOW (ref 135–145)
TCO2: 24 mmol/L (ref 22–32)

## 2022-10-16 LAB — CBC WITH DIFFERENTIAL/PLATELET
Abs Immature Granulocytes: 0.04 10*3/uL (ref 0.00–0.07)
Basophils Absolute: 0 10*3/uL (ref 0.0–0.1)
Basophils Relative: 1 %
Eosinophils Absolute: 0.1 10*3/uL (ref 0.0–0.5)
Eosinophils Relative: 1 %
HCT: 44.1 % (ref 36.0–46.0)
Hemoglobin: 14.1 g/dL (ref 12.0–15.0)
Immature Granulocytes: 1 %
Lymphocytes Relative: 22 %
Lymphs Abs: 1.4 10*3/uL (ref 0.7–4.0)
MCH: 26.1 pg (ref 26.0–34.0)
MCHC: 32 g/dL (ref 30.0–36.0)
MCV: 81.5 fL (ref 80.0–100.0)
Monocytes Absolute: 0.8 10*3/uL (ref 0.1–1.0)
Monocytes Relative: 12 %
Neutro Abs: 4.2 10*3/uL (ref 1.7–7.7)
Neutrophils Relative %: 63 %
Platelets: 194 10*3/uL (ref 150–400)
RBC: 5.41 MIL/uL — ABNORMAL HIGH (ref 3.87–5.11)
RDW: 16 % — ABNORMAL HIGH (ref 11.5–15.5)
WBC: 6.6 10*3/uL (ref 4.0–10.5)
nRBC: 0 % (ref 0.0–0.2)

## 2022-10-16 LAB — COMPREHENSIVE METABOLIC PANEL
ALT: 36 U/L (ref 0–44)
AST: 26 U/L (ref 15–41)
Albumin: 3.8 g/dL (ref 3.5–5.0)
Alkaline Phosphatase: 131 U/L — ABNORMAL HIGH (ref 38–126)
Anion gap: 14 (ref 5–15)
BUN: 17 mg/dL (ref 8–23)
CO2: 22 mmol/L (ref 22–32)
Calcium: 9.3 mg/dL (ref 8.9–10.3)
Chloride: 97 mmol/L — ABNORMAL LOW (ref 98–111)
Creatinine, Ser: 1.27 mg/dL — ABNORMAL HIGH (ref 0.44–1.00)
GFR, Estimated: 46 mL/min — ABNORMAL LOW (ref >60)
Glucose, Bld: 106 mg/dL — ABNORMAL HIGH (ref 70–99)
Potassium: 4 mmol/L (ref 3.5–5.1)
Sodium: 133 mmol/L — ABNORMAL LOW (ref 135–145)
Total Bilirubin: 0.8 mg/dL (ref 0.3–1.2)
Total Protein: 7.9 g/dL (ref 6.5–8.1)

## 2022-10-16 LAB — TROPONIN I (HIGH SENSITIVITY)
Troponin I (High Sensitivity): 10 ng/L (ref <18)
Troponin I (High Sensitivity): 8 ng/L (ref <18)

## 2022-10-16 LAB — CBG MONITORING, ED: Glucose-Capillary: 98 mg/dL (ref 70–99)

## 2022-10-16 MED ORDER — ONDANSETRON HCL 4 MG/2ML IJ SOLN
4.0000 mg | Freq: Once | INTRAMUSCULAR | Status: AC
  Administered 2022-10-16: 4 mg via INTRAVENOUS
  Filled 2022-10-16: qty 2

## 2022-10-16 MED ORDER — SODIUM CHLORIDE 0.9 % IV BOLUS
1000.0000 mL | Freq: Once | INTRAVENOUS | Status: AC
  Administered 2022-10-16: 1000 mL via INTRAVENOUS

## 2022-10-16 MED ORDER — IOHEXOL 350 MG/ML SOLN
75.0000 mL | Freq: Once | INTRAVENOUS | Status: AC | PRN
  Administered 2022-10-16: 75 mL via INTRAVENOUS

## 2022-10-16 MED ORDER — MORPHINE SULFATE (PF) 4 MG/ML IV SOLN
4.0000 mg | Freq: Once | INTRAVENOUS | Status: AC
  Administered 2022-10-16: 4 mg via INTRAVENOUS
  Filled 2022-10-16: qty 1

## 2022-10-16 NOTE — ED Notes (Addendum)
Pt reports recent abdominal; surgery and since has not been able to eat or drink due to "it being stuck" pt denies vomit and endorses nausea and is passing gas. Pt also reports crushing pain in stomach, chest, left shoulder and back. Denies SOB. Pt A&Ox4, NAD noted at this time. Support person at bedside.

## 2022-10-16 NOTE — ED Notes (Signed)
Pt to ct 

## 2022-10-16 NOTE — ED Provider Triage Note (Signed)
Emergency Medicine Provider Triage Evaluation Note  [name] , a 67 y.o. female  was evaluated in triage.  Pt complains of chest pain, abdominal pain. States that same began 3 days ago but got significantly worse today. Hx incarcerated hiatal hernia status post repair. 2 months ago. States that since surgery she has been on a liquid diet, escalated a few days ago to soft foods. States when she eats or drinks anything for the past 3 days it feels like it gets stuck in her esophagus. She has not vomited. Called her surgeon who told her to drink warm water which she tried without success. She states she is having watery diarrhea as well.   Review of Systems  Positive:  Negative:   Physical Exam  BP 110/61 (BP Location: Right Arm)   Pulse 84   Temp 98.3 F (36.8 C) (Oral)   Resp 20   Ht 5\' 3"  (1.6 m)   Wt 4.536 kg   SpO2 98%   BMI 1.77 kg/m  Gen:   Awake, no distress  obvious discomfort Resp:  Normal effort  MSK:   Moves extremities without difficulty  Other:    Medical Decision Making  Medically screening exam initiated at 3:32 PM.  Appropriate orders placed.  [name] was informed that the remainder of the evaluation will be completed by another provider, this initial triage assessment does not replace that evaluation, and the importance of remaining in the ED until their evaluation is complete.  Concern for recurrence of incarcerated hiatal hernia post surgery, will repeat CT chest abdomen pelvis for evaluation of same.    Kelli Bandy, PA-C 10/16/22 1536

## 2022-10-16 NOTE — ED Triage Notes (Signed)
Per [facility] pt coming from home- reports over the past 3 days unable to eat or take meds. States feels like stuck in her throat and she is nausea but has nothing in her stomach to come up. Reports extensive hernia surgery earlier this year and has not been able to have solid foods since then. Spoke with her GI recently and told her to drink hot water.

## 2022-10-16 NOTE — ED Provider Notes (Signed)
[place] [facility] AT [facility] Provider Note   CSN: [id number] Arrival date & time: 10/16/22  1441     History  Chief Complaint  Patient presents with   Abdominal Pain   Nausea    [name] is a 67 y.o. female.  Patient with history of CAD, CKD, fibromyalgia, incarcerated hiatal hernia status post repair by Dr. Michaell Cowing on 08/08/21 presents today with of chest pain and abdominal pain. States that same began 3 days ago but got significantly worse today. Pain feels similar to when she was found to have an incarcerated hiatal hernia. States that since surgery she has been on a liquid diet and drinking Ensure almost exclusively, was cleared a few days ago to escalate to soft foods. States when she eats or drinks anything for the past 3 days it feels like it gets stuck in her esophagus. She has not vomited but does endorse significant nausea. Called her surgeon Dr. Michaell Cowing who told her to drink warm water which she tried without success. She states she is having watery diarrhea as well.   The history is provided by the patient. No language interpreter was used.  Abdominal Pain Associated symptoms: chest pain        Home Medications Prior to Admission medications   Medication Sig Start Date End Date Taking? Authorizing Provider  acetaminophen (TYLENOL) 500 MG tablet Take 1 tablet (500 mg total) by mouth every 6 (six) hours as needed for mild pain or fever. 08/15/22   Juliet Rude, PA-C  Acetaminophen-Caffeine (EXCEDRIN ASPIRIN FREE PO) Take 1-2 tablets by mouth daily as needed (pain, headache).    [provider]  albuterol (VENTOLIN HFA) 108 (90 Base) MCG/ACT inhaler Inhale 2 puffs into the lungs every 4 (four) hours as needed for wheezing or shortness of breath. 08/15/22   Juliet Rude, PA-C  ALPRAZolam Prudy Feeler) 1 MG tablet Take 1 tablet by mouth 3 (three) times daily as needed for anxiety.    [provider]  amLODipine (NORVASC) 5 MG  tablet Take 1 tablet (5 mg total) by mouth daily. Patient not taking: Reported on 08/04/2022 01/03/22   Gaston Islam., NP  baclofen (LIORESAL) 10 MG tablet Take 10 mg by mouth 3 (three) times daily as needed for muscle spasms. 12/01/21   [provider]  DULoxetine (CYMBALTA) 20 MG capsule Take 2 capsules (40 mg total) by mouth 2 (two) times daily. 08/15/22   Juliet Rude, PA-C  famotidine (PEPCID) 20 MG tablet Take 20 mg by mouth 2 (two) times daily.     [provider]  gabapentin (NEURONTIN) 400 MG capsule Take 1 capsule (400 mg total) by mouth 3 (three) times daily. 08/15/22   Juliet Rude, PA-C  HYDROcodone-acetaminophen (NORCO) 10-325 MG tablet Take 1 tablet by mouth every 4 (four) hours as needed for moderate pain or severe pain. 08/15/22   Juliet Rude, PA-C  lisinopril (ZESTRIL) 5 MG tablet Take 5 mg by mouth daily.    [provider]  meloxicam (MOBIC) 15 MG tablet Take 15 mg by mouth daily.     [provider]  metoCLOPramide (REGLAN) 10 MG tablet Take 1 tablet (10 mg total) by mouth every 6 (six) hours as needed for nausea (nausea / vomiting). 08/15/22   Juliet Rude, PA-C  nitroGLYCERIN (NITROSTAT) 0.4 MG SL tablet Place 1 tablet (0.4 mg total) under the tongue every 5 (five) minutes x 3 doses as needed for chest  pain. 03/10/20   Georgie Chard D, NP  omeprazole (PRILOSEC) 20 MG capsule Take 20 mg by mouth 2 (two) times daily. 03/08/17   [provider]  pantoprazole (PROTONIX) 40 MG tablet Take 1 tablet (40 mg total) by mouth 2 (two) times daily. 08/15/22 09/14/22  Juliet Rude, PA-C  potassium chloride (MICRO-K) 10 MEQ CR capsule Take 10 mEq by mouth 2 (two) times daily. 12/01/21   [provider]  promethazine (PHENERGAN) 25 MG tablet Take 12.5-25 mg by mouth every 8 (eight) hours as needed for nausea or vomiting. 05/17/17   [provider]  rosuvastatin (CRESTOR) 20 MG tablet TAKE 1 TABLET BY MOUTH ONCE DAILY .  APPOINTMENT REQUIRED FOR FUTURE REFILLS Patient taking differently: Take 20 mg by mouth daily. 09/19/18   Lyn Records, MD  tamsulosin (FLOMAX) 0.4 MG CAPS capsule Take 0.4 mg by mouth daily.    [provider]  tiZANidine (ZANAFLEX) 4 MG tablet Take 1 tablet (4 mg total) by mouth 2 (two) times daily as needed for muscle spasms. 08/15/22   Juliet Rude, PA-C      Allergies    Asa [aspirin], Ditropan [oxybutynin], and Shellfish allergy    Review of Systems   Review of Systems  Cardiovascular:  Positive for chest pain.  Gastrointestinal:  Positive for abdominal pain.  All other systems reviewed and are negative.   Physical Exam Updated Vital Signs BP 105/77 (BP Location: Right Arm)   Pulse 80   Temp 98.3 F (36.8 C) (Oral)   Resp 17   Ht 5\' 3"  (1.6 m)   Wt 4.536 kg   SpO2 99%   BMI 1.77 kg/m  Physical Exam Vitals and nursing note reviewed.  Constitutional:      General: She is not in acute distress.    Appearance: Normal appearance. She is normal weight. She is not ill-appearing, toxic-appearing or diaphoretic.  HENT:     Head: Normocephalic and atraumatic.  Cardiovascular:     Rate and Rhythm: Normal rate and regular rhythm.     Heart sounds: Normal heart sounds.  Pulmonary:     Effort: Pulmonary effort is normal. No respiratory distress.     Breath sounds: Normal breath sounds.  Abdominal:     General: Abdomen is flat.     Palpations: Abdomen is soft.     Tenderness: There is no abdominal tenderness.  Musculoskeletal:        General: Normal range of motion.     Cervical back: Normal range of motion.  Skin:    General: Skin is warm and dry.  Neurological:     General: No focal deficit present.     Mental Status: She is alert.  Psychiatric:        Mood and Affect: Mood normal.        Behavior: Behavior normal.     ED Results / Procedures / Treatments   Labs (all labs ordered are listed, but only abnormal results are displayed) Labs Reviewed   CBC WITH DIFFERENTIAL/PLATELET - Abnormal; Notable for the following components:      Result Value   RBC 5.41 (*)    RDW 16.0 (*)    All other components within normal limits  COMPREHENSIVE METABOLIC PANEL - Abnormal; Notable for the following components:   Sodium 133 (*)    Chloride 97 (*)    Glucose, Bld 106 (*)    Creatinine, Ser 1.27 (*)    Alkaline Phosphatase 131 (*)  GFR, Estimated 46 (*)    All other components within normal limits  I-STAT CHEM 8, ED - Abnormal; Notable for the following components:   Sodium 134 (*)    Creatinine, Ser 1.20 (*)    Glucose, Bld 100 (*)    Calcium, Ion 1.09 (*)    All other components within normal limits  CBG MONITORING, ED  TROPONIN I (HIGH SENSITIVITY)  TROPONIN I (HIGH SENSITIVITY)    EKG EKG Interpretation  Date/Time:  Sunday October 16 2022 15:50:02 EDT Ventricular Rate:  91 PR Interval:  124 QRS Duration: 74 QT Interval:  374 QTC Calculation: 460 R Axis:   8 Text Interpretation: Normal sinus rhythm Cannot rule out Anterior infarct , age undetermined Abnormal ECG When compared with ECG of 04-Aug-2022 11:55, PREVIOUS ECG IS PRESENT Confirmed by Kristine Royal [phone]) on 10/16/2022 5:38:25 PM  Radiology CT CHEST ABDOMEN PELVIS W CONTRAST  Result Date: 10/16/2022 CLINICAL DATA:  Hiatal hernia, concern for volvulus EXAM: CT CHEST, ABDOMEN, AND PELVIS WITH CONTRAST TECHNIQUE: Multidetector CT imaging of the chest, abdomen and pelvis was performed following the standard protocol during bolus administration of intravenous contrast. RADIATION DOSE REDUCTION: This exam was performed according to the departmental dose-optimization program which includes automated exposure control, adjustment of the mA and/or kV according to patient size and/or use of iterative reconstruction technique. CONTRAST:  75mL OMNIPAQUE IOHEXOL 350 MG/ML SOLN COMPARISON:  10/03/2022 FINDINGS: CT CHEST FINDINGS Cardiovascular: Aortic atherosclerosis. Normal heart size.  Left and right coronary artery calcifications no pericardial effusion. Mediastinum/Nodes: No enlarged mediastinal, hilar, or axillary lymph nodes. Thyroid gland, trachea, and esophagus demonstrate no significant findings. Lungs/Pleura: Scarring and or atelectasis of the left-greater-than-right lung bases. Musculoskeletal: No chest wall abnormality. No acute osseous findings. CT ABDOMEN PELVIS FINDINGS Hepatobiliary: No focal liver abnormality is seen. Status post cholecystectomy. No biliary dilatation. Pancreas: Unremarkable. No pancreatic ductal dilatation or surrounding inflammatory changes. Spleen: Normal in size without significant abnormality. Adrenals/Urinary Tract: Adrenal glands are unremarkable. Kidneys are normal, without renal calculi, solid lesion, or hydronephrosis. Bladder is unremarkable. Stomach/Bowel: Interval hiatal hernia repair with no significant residual hiatal hernia. The colon is generally decompressed, however there appears to be mild diffuse colon wall thickening mucosal hyperenhancement (series series 3, image 69, 96). Vascular/Lymphatic: Aortic atherosclerosis. No enlarged abdominal or pelvic lymph nodes. Reproductive: Status post hysterectomy Other: No abdominal wall hernia or abnormality. No ascites. Musculoskeletal: No acute osseous findings. IMPRESSION: 1. Interval hiatal hernia repair with no significant residual or recurrent hiatal hernia. 2. The colon is generally decompressed, however there appears to be mild diffuse colon wall thickening mucosal hyperenhancement. Findings are suggestive of nonspecific infectious or inflammatory colitis. 3. Coronary artery disease. Aortic Atherosclerosis (ICD10-I70.0). Electronically Signed   By: Jearld Lesch M.D.   On: 10/16/2022 18:55    Procedures Procedures    Medications Ordered in ED Medications  sodium chloride 0.9 % bolus 1,000 mL (has no administration in time range)    ED Course/ Medical Decision Making/ A&P                              Medical Decision Making Amount and/or Complexity of Data Reviewed Labs: ordered. Radiology: ordered.  Risk Prescription drug management.   This patient is a 67 y.o. female who presents to the ED for concern of chest pain, abdominal pain, nausea, this involves an extensive number of treatment options, and is a complaint that carries with it a high risk  of complications and morbidity. The emergent differential diagnosis prior to evaluation includes, but is not limited to,  incarcerated hiatal hernia, esophageal stricture, ACS, volvulus . This is not an exhaustive differential.   Past Medical History / Co-morbidities / Social History: history of CAD, CKD, fibromyalgia, incarcerated hiatal hernia status post repair by Dr. Michaell Cowing on 08/08/21  Additional history: Chart reviewed. Pertinent results include: surgical hiatal hernia repair without complication with general surgery Dr. Michaell Cowing on 08/08/21  Physical Exam: Physical exam performed. The pertinent findings include: uncomfortable appearing, no vomiting.   Lab Tests: I ordered, and personally interpreted labs.  The pertinent results include: Na 133, chloride 97 creatinine 1.27 up from 0.81 2 months ago, likely dehydration poor intake. Troponin negative and flat   Imaging Studies: I ordered imaging studies including CT chest abdomen pelvis. I independently visualized and interpreted imaging which showed   1. Interval hiatal hernia repair with no significant residual or recurrent hiatal hernia. 2. The colon is generally decompressed, however there appears to be mild diffuse colon wall thickening mucosal hyperenhancement. Findings are suggestive of nonspecific infectious or inflammatory colitis. 3. Coronary artery disease.  I agree with the radiologist interpretation.   Cardiac Monitoring:  The patient was maintained on a cardiac monitor.  My attending physician Dr. Rodena Medin viewed and interpreted the cardiac monitored which  showed an underlying rhythm of: sinus rhythm. I agree with this interpretation.   Medications: I ordered medication including morphine, zofran, fluids  for pain, nausea, dehydration. Reevaluation of the patient after these medicines showed that the patient improved. I have reviewed the patients home medicines and have made adjustments as needed.  Consultations Obtained: I requested consultation with the general surgery on call Dr. Cliffton Asters,  and discussed lab and imaging findings as well as pertinent plan - they recommend: d/c with close outpatient follow-up with Dr. Michaell Cowing to determine need for potential endoscopy and dilation   Disposition: After consideration of the diagnostic results and the patients response to treatment, I feel that emergency department workup does not suggest an emergent condition requiring admission or immediate intervention beyond what has been performed at this time. The plan is: discharge with close outpatient surgery follow-up. Patient able to tolerate po intake without vomiting. Laboratory evaluation and imaging reassuring. Some contrast pooling in the distal esophagus on imaging, however Dr. Cliffton Asters with surgery notes that this is normal following this procedure. Will recommend patient resume liquid diet only and call her surgeon tomorrow for follow-up. Evaluation and diagnostic testing in the emergency department does not suggest an emergent condition requiring admission or immediate intervention beyond what has been performed at this time.  Plan for discharge with close PCP follow-up.  Patient is understanding and amenable with plan, educated on red flag symptoms that would prompt immediate return.  Patient discharged in stable condition.  I discussed this case with my attending physician Dr. Rodena Medin who cosigned this note including patient's presenting symptoms, physical exam, and planned diagnostics and interventions. Attending physician stated agreement with plan or made  changes to plan which were implemented.    Final Clinical Impression(s) / ED Diagnoses Final diagnoses:  Epigastric pain  Nausea    Rx / DC Orders ED Discharge Orders     None     An After Visit Summary was printed and given to the patient.     Vear Clock 10/16/22 1959    Wynetta Fines, MD 10/16/22 2115

## 2022-10-16 NOTE — Discharge Instructions (Signed)
As we discussed, your workup in the ER today was reassuring for findings.  CT imaging and laboratory evaluation did not reveal any emergent concerns.  I discussed with surgery.  States that you should call your surgeon Dr. Michaell Cowing tomorrow to schedule an appointment for follow-up follow-up management of your symptoms.  Please call their office first thing tomorrow.  In the interim, I recommend that you resume your liquid only diet.   Return if development of any vomiting or any new or worsening symptoms.

## 2022-10-16 NOTE — ED Notes (Addendum)
Pt now in room, NAD noted at this time.

## 2022-11-07 ENCOUNTER — Telehealth: Payer: Self-pay | Admitting: Hematology

## 2022-11-07 NOTE — Telephone Encounter (Signed)
Patient is aware of rescheduled appointment times/dates 

## 2022-11-08 ENCOUNTER — Ambulatory Visit: Payer: Self-pay | Admitting: Surgery

## 2022-11-14 ENCOUNTER — Encounter (HOSPITAL_COMMUNITY): Payer: Self-pay | Admitting: Surgery

## 2022-11-17 DIAGNOSIS — M5416 Radiculopathy, lumbar region: Secondary | ICD-10-CM | POA: Diagnosis not present

## 2022-12-05 ENCOUNTER — Ambulatory Visit: Payer: Medicare Other | Admitting: Hematology

## 2022-12-05 ENCOUNTER — Other Ambulatory Visit: Payer: Medicare Other

## 2022-12-28 ENCOUNTER — Other Ambulatory Visit: Payer: Self-pay

## 2022-12-28 DIAGNOSIS — D509 Iron deficiency anemia, unspecified: Secondary | ICD-10-CM

## 2022-12-29 ENCOUNTER — Inpatient Hospital Stay: Payer: Medicare Other | Attending: Hematology

## 2022-12-29 ENCOUNTER — Inpatient Hospital Stay: Payer: Medicare Other | Admitting: Hematology

## 2022-12-29 VITALS — BP 166/77 | HR 64 | Temp 98.6°F | Resp 18 | Wt 192.1 lb

## 2022-12-29 DIAGNOSIS — R5383 Other fatigue: Secondary | ICD-10-CM | POA: Insufficient documentation

## 2022-12-29 DIAGNOSIS — Z801 Family history of malignant neoplasm of trachea, bronchus and lung: Secondary | ICD-10-CM | POA: Diagnosis not present

## 2022-12-29 DIAGNOSIS — Z79899 Other long term (current) drug therapy: Secondary | ICD-10-CM | POA: Diagnosis not present

## 2022-12-29 DIAGNOSIS — R2689 Other abnormalities of gait and mobility: Secondary | ICD-10-CM | POA: Diagnosis not present

## 2022-12-29 DIAGNOSIS — G629 Polyneuropathy, unspecified: Secondary | ICD-10-CM | POA: Diagnosis not present

## 2022-12-29 DIAGNOSIS — I10 Essential (primary) hypertension: Secondary | ICD-10-CM | POA: Diagnosis not present

## 2022-12-29 DIAGNOSIS — N1831 Chronic kidney disease, stage 3a: Secondary | ICD-10-CM | POA: Diagnosis not present

## 2022-12-29 DIAGNOSIS — M109 Gout, unspecified: Secondary | ICD-10-CM | POA: Diagnosis not present

## 2022-12-29 DIAGNOSIS — I495 Sick sinus syndrome: Secondary | ICD-10-CM | POA: Diagnosis not present

## 2022-12-29 DIAGNOSIS — Z0000 Encounter for general adult medical examination without abnormal findings: Secondary | ICD-10-CM | POA: Diagnosis not present

## 2022-12-29 DIAGNOSIS — M797 Fibromyalgia: Secondary | ICD-10-CM | POA: Diagnosis not present

## 2022-12-29 DIAGNOSIS — K219 Gastro-esophageal reflux disease without esophagitis: Secondary | ICD-10-CM | POA: Diagnosis not present

## 2022-12-29 DIAGNOSIS — I25119 Atherosclerotic heart disease of native coronary artery with unspecified angina pectoris: Secondary | ICD-10-CM | POA: Diagnosis not present

## 2022-12-29 DIAGNOSIS — D509 Iron deficiency anemia, unspecified: Secondary | ICD-10-CM

## 2022-12-29 DIAGNOSIS — Z862 Personal history of diseases of the blood and blood-forming organs and certain disorders involving the immune mechanism: Secondary | ICD-10-CM | POA: Insufficient documentation

## 2022-12-29 DIAGNOSIS — G894 Chronic pain syndrome: Secondary | ICD-10-CM | POA: Diagnosis not present

## 2022-12-29 DIAGNOSIS — G43909 Migraine, unspecified, not intractable, without status migrainosus: Secondary | ICD-10-CM | POA: Diagnosis not present

## 2022-12-29 DIAGNOSIS — D508 Other iron deficiency anemias: Secondary | ICD-10-CM | POA: Diagnosis not present

## 2022-12-29 DIAGNOSIS — E782 Mixed hyperlipidemia: Secondary | ICD-10-CM | POA: Diagnosis not present

## 2022-12-29 DIAGNOSIS — R7303 Prediabetes: Secondary | ICD-10-CM | POA: Diagnosis not present

## 2022-12-29 DIAGNOSIS — Z23 Encounter for immunization: Secondary | ICD-10-CM | POA: Diagnosis not present

## 2022-12-29 LAB — IRON AND IRON BINDING CAPACITY (CC-WL,HP ONLY)
Iron: 55 ug/dL (ref 28–170)
Saturation Ratios: 13 % (ref 10.4–31.8)
TIBC: 428 ug/dL (ref 250–450)
UIBC: 373 ug/dL (ref 148–442)

## 2022-12-29 LAB — CMP (CANCER CENTER ONLY)
ALT: 31 U/L (ref 0–44)
AST: 25 U/L (ref 15–41)
Albumin: 4.2 g/dL (ref 3.5–5.0)
Alkaline Phosphatase: 149 U/L — ABNORMAL HIGH (ref 38–126)
Anion gap: 6 (ref 5–15)
BUN: 12 mg/dL (ref 8–23)
CO2: 30 mmol/L (ref 22–32)
Calcium: 9.6 mg/dL (ref 8.9–10.3)
Chloride: 107 mmol/L (ref 98–111)
Creatinine: 0.93 mg/dL (ref 0.44–1.00)
GFR, Estimated: >60 mL/min (ref >60)
Glucose, Bld: 107 mg/dL — ABNORMAL HIGH (ref 70–99)
Potassium: 4.5 mmol/L (ref 3.5–5.1)
Sodium: 143 mmol/L (ref 135–145)
Total Bilirubin: 0.3 mg/dL (ref 0.3–1.2)
Total Protein: 7.3 g/dL (ref 6.5–8.1)

## 2022-12-29 LAB — CBC WITH DIFFERENTIAL (CANCER CENTER ONLY)
Abs Immature Granulocytes: 0.01 10*3/uL (ref 0.00–0.07)
Basophils Absolute: 0.1 10*3/uL (ref 0.0–0.1)
Basophils Relative: 1 %
Eosinophils Absolute: 0.2 10*3/uL (ref 0.0–0.5)
Eosinophils Relative: 4 %
HCT: 38.3 % (ref 36.0–46.0)
Hemoglobin: 12.3 g/dL (ref 12.0–15.0)
Immature Granulocytes: 0 %
Lymphocytes Relative: 23 %
Lymphs Abs: 1.1 10*3/uL (ref 0.7–4.0)
MCH: 27.7 pg (ref 26.0–34.0)
MCHC: 32.1 g/dL (ref 30.0–36.0)
MCV: 86.3 fL (ref 80.0–100.0)
Monocytes Absolute: 0.4 10*3/uL (ref 0.1–1.0)
Monocytes Relative: 8 %
Neutro Abs: 3 10*3/uL (ref 1.7–7.7)
Neutrophils Relative %: 64 %
Platelet Count: 166 10*3/uL (ref 150–400)
RBC: 4.44 MIL/uL (ref 3.87–5.11)
RDW: 14.2 % (ref 11.5–15.5)
WBC Count: 4.7 10*3/uL (ref 4.0–10.5)
nRBC: 0 % (ref 0.0–0.2)

## 2022-12-29 LAB — FERRITIN: Ferritin: 26 ng/mL (ref 11–307)

## 2022-12-29 LAB — VITAMIN B12: Vitamin B-12: 551 pg/mL (ref 180–914)

## 2022-12-29 NOTE — Progress Notes (Signed)
HEMATOLOGY/ONCOLOGY CLINIC NOTE  Date of Service: 12/29/22   Patient Care Team: Georgann Housekeeper, MD as PCP - General (Internal Medicine) Rollene Rotunda, MD as PCP - Cardiology (Cardiology) Jearl Klinefelter (Gastroenterology) Kerin Salen, MD as Consulting Physician (Gastroenterology) Karie Soda, MD as Consulting Physician (General Surgery)  CHIEF COMPLAINTS/PURPOSE OF CONSULTATION:  Delayed follow-up for anemia  HISTORY OF PRESENTING ILLNESS:   [name] is a wonderful 67 y.o. female who has been referred to [place] by Dr. Georgann Housekeeper for evaluation and management of Anemia. The pt reports that she is doing well overall.   The pt reports that she has had "anemia off and on for the last 3-4 years, but last year was the worst ever." She started PO [place] slow release iron replacement for the last month, in addition to potassium replacement. She takes 3 pills of her PO iron per day (total of 135mg  elemental iron). Denies GI intolerance with iron replacement. She denies ice cravings or picca symptoms. She has not had IV iron in the past.  She has taken Omeprazole for 2-3 years and endorses "real bad acid reflux." She had a bleeding ulcer in 2018. She denies ulcers or concerns for GI bleeding recently, and denies blood in the stools or black stools. She notes that she recently had a colonoscopy and endoscopy in the last couple months with [facility] and notes that this was unremarkable. The pt notes that she has had two blood transfusions, one in 2018 with stomach ulcer bleed and second prior to a February 2020 surgery.  The pt had surgery on her left wrist in February 2020 after falling out of her bed and fracturing her bed. She will be having her bed lowered soon. She notes that she also fell a few weeks ago when she lost her balance, and sustained a cut on her forehead requiring stitches. She notes that she has poor balance and endorses peripheral neuropathy. She also  endorses fibro myalgia.   The pt notes that she has migraines and uses Excedrin a few times each month.   She notes that she had less fatigue after her blood transfusions, but has recently been feeling more tired. She denies dietary restrictions. She notes that she has a hard time swallowing occasionally, and has had her esophagus dilated once before. She notes that she doesn't have much of an appetite. She denies unexpected weight loss. She endorses staying very well hydrated with frequent water consumption.  Most recent lab results (06/27/18) of CBC is as follows: all values are WNL except for HGB at 9.6, HCT at 31.1, MCV at 67.9, MCH at 21.0, MCHC at 30.9, RDW at 25.3.  On review of systems, pt reports feeling tired, low appetite, acid reflux, poor balance, and denies picca symptoms, unexpected weight loss, noticing new lumps or bumps, new pain along the spine, abdominal pains, leg swelling, and any other symptoms.   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 On Social Hx the pt denies alcohol consumption or ever smoking cigarettes. On Family Hx the pt denies blood disorders  INTERVAL HISTORY:  [name] is here for follow-up for continued evaluation and management of her anemia.  Patient was last seen by me on 06/06/2022 and reported stomach issues and nausea following IV iron infusion. She also complained of chronic nausea and was taking Zanaflex 4 mg to manage it.   Patient had hiatal hernia incarceration surgery on 08/09/2022 and surgery for esophageal dysphagia  on 11/08/2022. She has a planned EGD on 01/20/2023.   Today, she is accompanied by her husband. She reports that she has not consumed solid-foods for the last few months because it becomes stuck in her throat. She has had a liquid-only diet since April, including smoothies and protein shakes. She has lost 30 pounds since then. Patient notes discomfort when drinking liquids too quickly as she feels like she is  "drowning" in the liquid. She has not issues otherwise when drinking liquids slowly.   She denies any major medication changes. Patient continues to take an acid suppressant regularly. She is not on any oral iron at this time. Patient generally crushes her medications in order to swallow them without any issues.    She denies any unexplained fevers, chills, night sweats, leg swelling, black stools or blood in the stool.   MEDICAL HISTORY:  Past Medical History:  Diagnosis Date   Anemia    Anxiety    Arthritis    "all over" (11/16/2016)   CAD (coronary artery disease)    Chronic lower back pain    Chronic pain syndrome    "all over" (11/16/2016)   CKD (chronic kidney disease), stage III (HCC)    Depression    Fibromyalgia    GERD (gastroesophageal reflux disease)    History of blood transfusion    "related to hand laceration"   History of hiatal hernia    History of stomach ulcers    "bled when I was younger" (11/16/2016)   Hypercholesterolemia    Hypertension    hx; "they took me off my RX" (11/16/2016)   Migraine 1970s   Phlebitis of leg, right    "years ago" (11/16/2016)   Radiculopathy     SURGICAL HISTORY: Past Surgical History:  Procedure Laterality Date   ABDOMINAL HYSTERECTOMY  1993   w/left ovary   ANKLE FRACTURE SURGERY Bilateral    APPENDECTOMY  1977   BACK SURGERY     CARDIAC CATHETERIZATION  2005   "negative"   CHOLECYSTECTOMY OPEN  1977   ESOPHAGOGASTRODUODENOSCOPY  2008   dx'd GERD   FOREARM FRACTURE SURGERY Right    HIP SURGERY Bilateral    "took out bone spurs"   INSERTION OF MESH N/A 08/09/2022   Procedure: INSERTION OF MESH;  Surgeon: Karie Soda, MD;  Location: [facility] OR;  Service: General;  Laterality: N/A;   JOINT REPLACEMENT     KNEE ARTHROSCOPY Bilateral    "total of 18 times"   LACERATION REPAIR Right    hand; "went thru glass door"   LEFT HEART CATH AND CORONARY ANGIOGRAPHY N/A 11/18/2016   Procedure: Left Heart Cath and Coronary Angiography;   Surgeon: Yvonne Kendall, MD;  Location: [facility];  Service: Cardiovascular;  Laterality: N/A;   LEFT HEART CATH AND CORONARY ANGIOGRAPHY N/A 03/16/2020   Procedure: LEFT HEART CATH AND CORONARY ANGIOGRAPHY;  Surgeon: Yvonne Kendall, MD;  Location: [facility];  Service: Cardiovascular;  Laterality: N/A;   LUMBAR FUSION     "put rods in"   LUMBAR LAMINECTOMY/DECOMPRESSION MICRODISCECTOMY  05/2006   OPEN REDUCTION INTERNAL FIXATION (ORIF) DISTAL RADIAL FRACTURE Left 06/18/2018   Procedure: OPEN REDUCTION INTERNAL FIXATION (ORIF) DISTAL RADIAL FRACTURE repair and/or reconstruction;  Surgeon: Bradly Bienenstock, MD;  Location: [facility] OR;  Service: Orthopedics;  Laterality: Left;   REVISION TOTAL KNEE ARTHROPLASTY Bilateral    RIGHT OOPHORECTOMY Right    SHOULDER OPEN ROTATOR CUFF REPAIR Bilateral    left 04/2006   TONSILLECTOMY  TOTAL KNEE ARTHROPLASTY Bilateral    left 1998; right 2004   TUBAL LIGATION     XI ROBOTIC ASSISTED HIATAL HERNIA REPAIR N/A 08/09/2022   Procedure: XI ROBOTIC ASSISTED HIATAL HERNIA REPAIR WITH PARTIAL TOUPET FUNDOPLICATION;  Surgeon: Karie Soda, MD;  Location: [facility] OR;  Service: General;  Laterality: N/A;    SOCIAL HISTORY: Social History   Socioeconomic History   Marital status: Single    Spouse name: Not on file   Number of children: 0   Years of education: college   Highest education level: Not on file  Occupational History    Comment: retired  Tobacco Use   Smoking status: Never   Smokeless tobacco: Never  Vaping Use   Vaping status: Never Used  Substance and Sexual Activity   Alcohol use: Yes    Comment: occas   Drug use: No   Sexual activity: Yes  Other Topics Concern   Not on file  Social History Narrative   Lives with Myrla Halsted.     Education: college.     Children none.     Retired and Disabled.     Caffeine Drinks tea.   Social Determinants of Health   Financial Resource Strain: Not on file  Food Insecurity: No Food  Insecurity (08/03/2022)   Hunger Vital Sign    Worried About Running Out of Food in the Last Year: Never true    Ran Out of Food in the Last Year: Never true  Transportation Needs: No Transportation Needs (08/03/2022)   PRAPARE - Administrator, Civil Service (Medical): No    Lack of Transportation (Non-Medical): No  Physical Activity: Not on file  Stress: Not on file  Social Connections: Not on file  Intimate Partner Violence: Not At Risk (08/04/2022)   Humiliation, Afraid, Rape, and Kick questionnaire    Fear of Current or Ex-Partner: No    Emotionally Abused: No    Physically Abused: No    Sexually Abused: No    FAMILY HISTORY: Family History  Problem Relation Age of Onset   COPD Mother    Emphysema Mother    CAD Father 47   Lung cancer Father    Heart disease Father    Other Sister        TRAP syndrome   Diabetes Sister        boarderline   Iron deficiency Sister     ALLERGIES:  is allergic to asa [aspirin], ditropan [oxybutynin], and shellfish allergy.  MEDICATIONS:  Current Outpatient Medications  Medication Sig Dispense Refill   acetaminophen (TYLENOL) 500 MG tablet Take 1 tablet (500 mg total) by mouth every 6 (six) hours as needed for mild pain or fever. 30 tablet 0   Acetaminophen-Caffeine (EXCEDRIN ASPIRIN FREE PO) Take 1-2 tablets by mouth daily as needed (pain, headache).     albuterol (VENTOLIN HFA) 108 (90 Base) MCG/ACT inhaler Inhale 2 puffs into the lungs every 4 (four) hours as needed for wheezing or shortness of breath.     ALPRAZolam (XANAX) 1 MG tablet Take 1 mg by mouth 3 (three) times daily as needed for anxiety.     baclofen (LIORESAL) 10 MG tablet Take 10 mg by mouth 3 (three) times daily as needed for muscle spasms.     Dexlansoprazole 30 MG capsule DR Take 30 mg by mouth daily.     DULoxetine (CYMBALTA) 20 MG capsule Take 2 capsules (40 mg total) by mouth 2 (two) times daily. 30 capsule 0  gabapentin (NEURONTIN) 400 MG capsule Take 1  capsule (400 mg total) by mouth 3 (three) times daily. 90 capsule 0   HYDROcodone-acetaminophen (NORCO) 10-325 MG tablet Take 1 tablet by mouth every 4 (four) hours as needed for moderate pain or severe pain. 30 tablet 0   lisinopril (ZESTRIL) 5 MG tablet Take 5 mg by mouth at bedtime.     meloxicam (MOBIC) 15 MG tablet Take 15 mg by mouth daily.      metoCLOPramide (REGLAN) 10 MG tablet Take 1 tablet (10 mg total) by mouth every 6 (six) hours as needed for nausea (nausea / vomiting). 10 tablet 0   nitroGLYCERIN (NITROSTAT) 0.4 MG SL tablet Place 1 tablet (0.4 mg total) under the tongue every 5 (five) minutes x 3 doses as needed for chest pain. 25 tablet 2   ondansetron (ZOFRAN) 8 MG tablet Take 8 mg by mouth 3 (three) times daily as needed for nausea or vomiting.     potassium chloride (KLOR-CON) 10 MEQ tablet Take 10 mEq by mouth 2 (two) times daily.     rosuvastatin (CRESTOR) 20 MG tablet TAKE 1 TABLET BY MOUTH ONCE DAILY . APPOINTMENT REQUIRED FOR FUTURE REFILLS 30 tablet 0   tamsulosin (FLOMAX) 0.4 MG CAPS capsule Take 0.4 mg by mouth daily.     tiZANidine (ZANAFLEX) 4 MG tablet Take 1 tablet (4 mg total) by mouth 2 (two) times daily as needed for muscle spasms.     No current facility-administered medications for this visit.    REVIEW OF SYSTEMS:    10 Point review of Systems was done is negative except as noted above.   PHYSICAL EXAMINATION:  .BP (!) 166/77   Pulse 64   Temp 98.6 F (37 C)   Resp 18   Wt 192 lb 1.6 oz (87.1 kg)   SpO2 98%   BMI 34.03 kg/m   GENERAL:alert, in no acute distress and comfortable SKIN: no acute rashes, no significant lesions EYES: conjunctiva are pink and non-injected, sclera anicteric OROPHARYNX: MMM, no exudates, no oropharyngeal erythema or ulceration NECK: supple, no JVD LYMPH:  no palpable lymphadenopathy in the cervical, axillary or inguinal regions LUNGS: clear to auscultation b/l with normal respiratory effort HEART: regular rate &  rhythm ABDOMEN:  normoactive bowel sounds , non tender, not distended. Extremity: no pedal edema PSYCH: alert & oriented x 3 with fluent speech NEURO: no focal motor/sensory deficits   LABORATORY DATA:  I have reviewed the data as listed  .    Latest Ref Rng & Units 12/29/2022    1:33 PM 10/16/2022    3:58 PM 10/16/2022    3:57 PM  CBC  WBC 4.0 - 10.5 K/uL 4.7   6.6   Hemoglobin 12.0 - 15.0 g/dL 96.0  45.4  09.8   Hematocrit 36.0 - 46.0 % 38.3  44.0  44.1   Platelets 150 - 400 K/uL 166   194     .    Latest Ref Rng & Units 12/29/2022    1:33 PM 10/16/2022    3:58 PM 10/16/2022    3:57 PM  CMP  Glucose 70 - 99 mg/dL 119  147  829   BUN 8 - 23 mg/dL 12  19  17    Creatinine 0.44 - 1.00 mg/dL 5.62  1.30  8.65   Sodium 135 - 145 mmol/L 143  134  133   Potassium 3.5 - 5.1 mmol/L 4.5  4.1  4.0   Chloride 98 - 111 mmol/L 107  102  97   CO2 22 - 32 mmol/L 30   22   Calcium 8.9 - 10.3 mg/dL 9.6   9.3   Total Protein 6.5 - 8.1 g/dL 7.3   7.9   Total Bilirubin 0.3 - 1.2 mg/dL 0.3   0.8   Alkaline Phos 38 - 126 U/L 149   131   AST 15 - 41 U/L 25   26   ALT 0 - 44 U/L 31   36    Iron/TIBC/Ferritin/ %Sat    Component Value Date/Time   IRON 55 12/29/2022 1333   TIBC 428 12/29/2022 1333   FERRITIN 26 12/29/2022 1333   IRONPCTSAT 13 12/29/2022 1333    06/27/18 CBC:   Component     Latest Ref Rng & Units 11/06/2018  Retic Ct Pct     0.4 - 3.1 % 3.1  RBC.     3.87 - 5.11 MIL/uL 4.40  Retic Count, Absolute     19.0 - 186.0 K/uL 138.2  Immature Retic Fract     2.3 - 15.9 % 23.1 (H)  Folate, Hemolysate     Not Estab. ng/mL 371.0  HCT     34.0 - 46.6 % 32.3 (L)  Folate, RBC     >498 ng/mL 1,149  Vitamin B12     180 - 914 pg/mL 458  Sed Rate     0 - 22 mm/hr 19   RADIOGRAPHIC STUDIES: I have personally reviewed the radiological images as listed and agreed with the findings in the report. No results found.  ASSESSMENT & PLAN:   67 y.o. female with  1.  Severe microcytic  anemia due to iron deficiency 2.  Iron deficiency likely due to chronic GI losses and decreased iron absorption due to chronic PPI use  PLAN:  -Discussed lab results on 12/29/2022 in detail with patient. CBC showed WBC of 4.7K, hemoglobin of 12.3, and platelets of 166K. -previous anemia was likely related to surgical blood loss -she has no anemia at this time -iron labs -ferritin down to 26 from 59 with iron saturation of 13% -vitamin B12 labs wnl at 551 -patient has an EGD planned on 01/20/2023 for further evaluation -no indication for additional IV iron at this time. -patient prefers to take a conservative approach will monitor labs again in 6 months -Answered all of patient's questions in detail  FOLLOW-UP: RTC with Dr Candise Che with labs in 6 months  The total time spent in the appointment was 20 minutes* .  All of the patient's questions were answered with apparent satisfaction. The patient knows to call the clinic with any problems, questions or concerns.   Wyvonnia Lora MD MS AAHIVMS [facility] Hematology/Oncology Physician [facility]  .*Total Encounter Time as defined by the Centers for Medicare and Medicaid Services includes, in addition to the face-to-face time of a patient visit (documented in the note above) non-face-to-face time: obtaining and reviewing outside history, ordering and reviewing medications, tests or procedures, care coordination (communications with other health care professionals or caregivers) and documentation in the medical record.    I,Mitra Faeizi,acting as a Neurosurgeon for Wyvonnia Lora, MD.,have documented all relevant documentation on the behalf of Wyvonnia Lora, MD,as directed by  Wyvonnia Lora, MD while in the presence of Wyvonnia Lora, MD.  .I have reviewed the above documentation for accuracy and completeness, and I agree with the above. Johney Maine MD

## 2023-01-04 ENCOUNTER — Encounter: Payer: Self-pay | Admitting: Hematology

## 2023-01-10 DIAGNOSIS — H16223 Keratoconjunctivitis sicca, not specified as Sjogren's, bilateral: Secondary | ICD-10-CM | POA: Diagnosis not present

## 2023-01-13 ENCOUNTER — Encounter (HOSPITAL_COMMUNITY): Payer: Self-pay | Admitting: Surgery

## 2023-01-16 NOTE — Progress Notes (Signed)
Attempted to obtain medical history via telephone, unable to reach at this time. HIPAA compliant voicemail message left requesting return call to pre surgical testing department. 

## 2023-01-17 ENCOUNTER — Other Ambulatory Visit: Payer: Self-pay | Admitting: Internal Medicine

## 2023-01-17 DIAGNOSIS — Z1231 Encounter for screening mammogram for malignant neoplasm of breast: Secondary | ICD-10-CM

## 2023-01-17 NOTE — Progress Notes (Signed)
[name]  PCP-Karrar Donette Larry MD Cardiologist- Sarina Ser MD  EKG-10/16/22 Echo-08/05/22 Cath- 03/16/20 Stress-n/a ICD/PM-n/a Blood thinner-n/a GLP-1-n/a  Hx: CAD, HTN, CKD3. Last office visit with cardiology 01/03/22, was also seen while in hospital on 08/04/22. Was admitted to hospital for urgent hernia repair, did cards clearance prior to operation and did discuss holding off d/t getting a cath but decided if echo was okay was ok to proceed without getting cath first. Pt reports hasnt seen cards since out of hospital but hasnt had any new chest pains/sob. Does endorse walking with walker, but not new for her.  Anesthesia Review: Yes

## 2023-01-20 ENCOUNTER — Ambulatory Visit (HOSPITAL_BASED_OUTPATIENT_CLINIC_OR_DEPARTMENT_OTHER): Payer: Medicare Other

## 2023-01-20 ENCOUNTER — Ambulatory Visit (HOSPITAL_COMMUNITY): Payer: Medicare Other

## 2023-01-20 ENCOUNTER — Ambulatory Visit (HOSPITAL_COMMUNITY)
Admission: RE | Admit: 2023-01-20 | Discharge: 2023-01-20 | Disposition: A | Discharge status: Home / Self Care | Payer: Medicare Other | Attending: Surgery | Admitting: Surgery

## 2023-01-20 ENCOUNTER — Encounter (HOSPITAL_COMMUNITY): Payer: Self-pay | Admitting: Surgery

## 2023-01-20 ENCOUNTER — Encounter (HOSPITAL_COMMUNITY)
Admission: RE | Disposition: A | Discharge status: Home / Self Care | Payer: Self-pay | Source: Home / Self Care | Attending: Surgery

## 2023-01-20 ENCOUNTER — Other Ambulatory Visit: Payer: Self-pay

## 2023-01-20 DIAGNOSIS — I10 Essential (primary) hypertension: Secondary | ICD-10-CM

## 2023-01-20 DIAGNOSIS — K2289 Other specified disease of esophagus: Secondary | ICD-10-CM | POA: Insufficient documentation

## 2023-01-20 DIAGNOSIS — K3189 Other diseases of stomach and duodenum: Secondary | ICD-10-CM | POA: Insufficient documentation

## 2023-01-20 DIAGNOSIS — F418 Other specified anxiety disorders: Secondary | ICD-10-CM | POA: Diagnosis not present

## 2023-01-20 DIAGNOSIS — E7800 Pure hypercholesterolemia, unspecified: Secondary | ICD-10-CM | POA: Diagnosis not present

## 2023-01-20 DIAGNOSIS — R110 Nausea: Secondary | ICD-10-CM | POA: Insufficient documentation

## 2023-01-20 DIAGNOSIS — R1310 Dysphagia, unspecified: Secondary | ICD-10-CM

## 2023-01-20 DIAGNOSIS — K440 Diaphragmatic hernia with obstruction, without gangrene: Secondary | ICD-10-CM | POA: Diagnosis not present

## 2023-01-20 DIAGNOSIS — F1120 Opioid dependence, uncomplicated: Secondary | ICD-10-CM | POA: Diagnosis not present

## 2023-01-20 DIAGNOSIS — I25119 Atherosclerotic heart disease of native coronary artery with unspecified angina pectoris: Secondary | ICD-10-CM | POA: Diagnosis not present

## 2023-01-20 DIAGNOSIS — R1319 Other dysphagia: Secondary | ICD-10-CM | POA: Diagnosis not present

## 2023-01-20 HISTORY — PX: ESOPHAGOGASTRODUODENOSCOPY (EGD) WITH PROPOFOL: SHX5813

## 2023-01-20 HISTORY — PX: BALLOON DILATION: SHX5330

## 2023-01-20 HISTORY — PX: SUBMUCOSAL INJECTION: SHX5543

## 2023-01-20 SURGERY — BALLOON DILATION
Anesthesia: Monitor Anesthesia Care

## 2023-01-20 MED ORDER — TRIAMCINOLONE ACETONIDE 40 MG/ML IJ SUSP
INTRAMUSCULAR | Status: DC | PRN
  Administered 2023-01-20: 40 mg via INTRAMUSCULAR

## 2023-01-20 MED ORDER — PROPOFOL 10 MG/ML IV BOLUS
INTRAVENOUS | Status: DC | PRN
  Administered 2023-01-20 (×2): 20 mg via INTRAVENOUS
  Administered 2023-01-20: 40 mg via INTRAVENOUS

## 2023-01-20 MED ORDER — LIDOCAINE 2% (20 MG/ML) 5 ML SYRINGE
INTRAMUSCULAR | Status: DC | PRN
  Administered 2023-01-20: 100 mg via INTRAVENOUS

## 2023-01-20 MED ORDER — ONDANSETRON HCL 4 MG/2ML IJ SOLN
INTRAMUSCULAR | Status: DC | PRN
  Administered 2023-01-20: 4 mg via INTRAVENOUS

## 2023-01-20 MED ORDER — GLYCOPYRROLATE 0.2 MG/ML IJ SOLN
INTRAMUSCULAR | Status: DC | PRN
Start: 2023-01-20 — End: 2023-01-20
  Administered 2023-01-20: .2 mg via INTRAVENOUS

## 2023-01-20 MED ORDER — DEXAMETHASONE SODIUM PHOSPHATE 10 MG/ML IJ SOLN
INTRAMUSCULAR | Status: DC | PRN
  Administered 2023-01-20: 10 mg via INTRAVENOUS

## 2023-01-20 MED ORDER — TRIAMCINOLONE ACETONIDE 40 MG/ML IJ SUSP: 40.0000 mg | INTRAMUSCULAR | Status: DC

## 2023-01-20 MED ORDER — LIDOCAINE VISCOUS HCL 2 % MT SOLN: 15.0000 mL | Freq: Once | OROMUCOSAL | Status: DC

## 2023-01-20 MED ORDER — DEXMEDETOMIDINE HCL IN NACL 80 MCG/20ML IV SOLN
INTRAVENOUS | Status: DC | PRN
  Administered 2023-01-20: 4 ug via INTRAVENOUS
  Administered 2023-01-20: 8 ug via INTRAVENOUS

## 2023-01-20 MED ORDER — PROPOFOL 500 MG/50ML IV EMUL
INTRAVENOUS | Status: DC | PRN
  Administered 2023-01-20: 200 ug/kg/min via INTRAVENOUS

## 2023-01-20 MED ORDER — DEXAMETHASONE SODIUM PHOSPHATE 10 MG/ML IJ SOLN
8.0000 mg | INTRAMUSCULAR | Status: DC
  Filled 2023-01-20: qty 0.8

## 2023-01-20 MED ORDER — LACTATED RINGERS IV SOLN
INTRAVENOUS | Status: DC
  Administered 2023-01-20: 1000 mL via INTRAVENOUS

## 2023-01-20 MED ORDER — SODIUM CHLORIDE (PF) 0.9 % IJ SOLN
INTRAMUSCULAR | Status: AC
  Filled 2023-01-20: qty 10

## 2023-01-20 MED ORDER — TRIAMCINOLONE ACETONIDE 40 MG/ML IJ SUSP
INTRAMUSCULAR | Status: AC
  Filled 2023-01-20: qty 1

## 2023-01-20 NOTE — Interval H&P Note (Signed)
History and Physical Interval Note:  01/20/2023 11:38 AM  [name]  has presented today for surgery, with the diagnosis of DYSPHAGIA AFTER FUNDOPLICATION.  The various methods of treatment have been discussed with the patient and family. After consideration of risks, benefits and other options for treatment, the patient has consented to  Procedure(s): ESOPHAGOGASTRODUODENOSCOPY (EGD) POSSIBLE DILATION, POSSIBLE BIOPSIES (N/A) as a surgical intervention.  The patient's history has been reviewed, patient examined, no change in status, stable for surgery.  I have reviewed the patient's chart and labs.  Questions were answered to the patient's satisfaction.    I have re-reviewed the the patient's records, history, medications, and allergies.  I have re-examined the patient.  I again discussed intraoperative plans and goals of post-operative recovery.  The patient agrees to proceed.  [name]  [birth year] [id number]  Patient Care Team: Georgann Housekeeper, MD as PCP - General (Internal Medicine) Rollene Rotunda, MD as PCP - Cardiology (Cardiology) Legrand Como, PA-C (Gastroenterology) Kerin Salen, MD as Consulting Physician (Gastroenterology) Karie Soda, MD as Consulting Physician (General Surgery)  Patient Active Problem List   Diagnosis Date Noted   Abnormal gait 08/10/2022   Paraparesis (HCC) 08/10/2022   Incarcerated hiatal hernia 08/03/2022   Iron deficiency anemia 02/07/2022   Low back pain 12/07/2021   Recurrent falls 12/07/2021   Muscle weakness 04/14/2021   Lumbar post-laminectomy syndrome 08/14/2020   Accelerating angina (HCC) 03/16/2020   Microcytic hypochromic anemia 06/15/2018   Fracture of left wrist 06/15/2018   Lumbar spondylosis 04/27/2017   Cervical radiculopathy 04/27/2017   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   Coronary artery disease involving native coronary artery of native heart with angina pectoris (HCC)    Pure  hypercholesterolemia    Chest pain with moderate risk for cardiac etiology    Prolonged QT interval    Bradycardia    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    Past Medical History:  Diagnosis Date   Anemia    Anxiety    Arthritis    "all over" (11/16/2016)   CAD (coronary artery disease)    Chronic lower back pain    Chronic pain syndrome    "all over" (11/16/2016)   CKD (chronic kidney disease), stage III (HCC)    Depression    Fibromyalgia    GERD (gastroesophageal reflux disease)    History of blood transfusion    "related to hand laceration"   History of hiatal hernia    History of stomach ulcers    "bled when I was younger" (11/16/2016)   Hypercholesterolemia    Hypertension    hx; "they took me off my RX" (11/16/2016)   Migraine 1970s   Phlebitis of leg, right    "years ago" (11/16/2016)   Radiculopathy     Past Surgical History:  Procedure Laterality Date   ABDOMINAL HYSTERECTOMY  1993   w/left ovary   ANKLE FRACTURE SURGERY Bilateral    APPENDECTOMY  1977   BACK SURGERY     CARDIAC CATHETERIZATION  2005   "negative"   CHOLECYSTECTOMY OPEN  1977   ESOPHAGOGASTRODUODENOSCOPY  2008   dx'd GERD   FOREARM FRACTURE SURGERY Right    HIP SURGERY Bilateral    "took out bone spurs"   INSERTION OF MESH N/A 08/09/2022   Procedure: INSERTION OF MESH;  Surgeon: Karie Soda, MD;  Location: [facility] OR;  Service: General;  Laterality: N/A;   JOINT REPLACEMENT     KNEE ARTHROSCOPY Bilateral    "total of 18 times"  LACERATION REPAIR Right    hand; "went thru glass door"   LEFT HEART CATH AND CORONARY ANGIOGRAPHY N/A 11/18/2016   Procedure: Left Heart Cath and Coronary Angiography;  Surgeon: Yvonne Kendall, MD;  Location: [facility];  Service: Cardiovascular;  Laterality: N/A;   LEFT HEART CATH AND CORONARY ANGIOGRAPHY N/A 03/16/2020   Procedure: LEFT HEART CATH AND CORONARY ANGIOGRAPHY;  Surgeon: Yvonne Kendall, MD;  Location: [facility];  Service: Cardiovascular;   Laterality: N/A;   LUMBAR FUSION     "put rods in"   LUMBAR LAMINECTOMY/DECOMPRESSION MICRODISCECTOMY  05/2006   OPEN REDUCTION INTERNAL FIXATION (ORIF) DISTAL RADIAL FRACTURE Left 06/18/2018   Procedure: OPEN REDUCTION INTERNAL FIXATION (ORIF) DISTAL RADIAL FRACTURE repair and/or reconstruction;  Surgeon: Bradly Bienenstock, MD;  Location: [facility] OR;  Service: Orthopedics;  Laterality: Left;   REVISION TOTAL KNEE ARTHROPLASTY Bilateral    RIGHT OOPHORECTOMY Right    SHOULDER OPEN ROTATOR CUFF REPAIR Bilateral    left 04/2006   TONSILLECTOMY     TOTAL KNEE ARTHROPLASTY Bilateral    left 1998; right 2004   TUBAL LIGATION     XI ROBOTIC ASSISTED HIATAL HERNIA REPAIR N/A 08/09/2022   Procedure: XI ROBOTIC ASSISTED HIATAL HERNIA REPAIR WITH PARTIAL TOUPET FUNDOPLICATION;  Surgeon: Karie Soda, MD;  Location: [facility] OR;  Service: General;  Laterality: N/A;    Social History   Socioeconomic History   Marital status: Single    Spouse name: Not on file   Number of children: 0   Years of education: college   Highest education level: Not on file  Occupational History    Comment: retired  Tobacco Use   Smoking status: Never   Smokeless tobacco: Never  Vaping Use   Vaping status: Never Used  Substance and Sexual Activity   Alcohol use: Yes    Comment: occas   Drug use: No   Sexual activity: Yes  Other Topics Concern   Not on file  Social History Narrative   Lives with Myrla Halsted.     Education: college.     Children none.     Retired and Disabled.     Caffeine Drinks tea.   Social Determinants of Health   Financial Resource Strain: Not on file  Food Insecurity: No Food Insecurity (08/03/2022)   Hunger Vital Sign    Worried About Running Out of Food in the Last Year: Never true    Ran Out of Food in the Last Year: Never true  Transportation Needs: No Transportation Needs (08/03/2022)   PRAPARE - Administrator, Civil Service (Medical): No    Lack of Transportation  (Non-Medical): No  Physical Activity: Not on file  Stress: Not on file  Social Connections: Not on file  Intimate Partner Violence: Not At Risk (08/04/2022)   Humiliation, Afraid, Rape, and Kick questionnaire    Fear of Current or Ex-Partner: No    Emotionally Abused: No    Physically Abused: No    Sexually Abused: No    Family History  Problem Relation Age of Onset   COPD Mother    Emphysema Mother    CAD Father 23   Lung cancer Father    Heart disease Father    Other Sister        TRAP syndrome   Diabetes Sister        boarderline   Iron deficiency Sister     Medications Prior to Admission  Medication Sig Dispense Refill Last Dose  acetaminophen (TYLENOL) 500 MG tablet Take 1 tablet (500 mg total) by mouth every 6 (six) hours as needed for mild pain or fever. 30 tablet 0 01/19/2023   albuterol (VENTOLIN HFA) 108 (90 Base) MCG/ACT inhaler Inhale 2 puffs into the lungs every 4 (four) hours as needed for wheezing or shortness of breath.   Past Month   ALPRAZolam (XANAX) 1 MG tablet Take 1 mg by mouth 3 (three) times daily as needed for anxiety.   01/19/2023   baclofen (LIORESAL) 10 MG tablet Take 10 mg by mouth 3 (three) times daily as needed for muscle spasms.   01/19/2023   Dexlansoprazole 30 MG capsule DR Take 30 mg by mouth daily.   01/20/2023   DULoxetine (CYMBALTA) 20 MG capsule Take 2 capsules (40 mg total) by mouth 2 (two) times daily. 30 capsule 0 01/19/2023   gabapentin (NEURONTIN) 400 MG capsule Take 1 capsule (400 mg total) by mouth 3 (three) times daily. 90 capsule 0 01/20/2023   HYDROcodone-acetaminophen (NORCO) 10-325 MG tablet Take 1 tablet by mouth every 4 (four) hours as needed for moderate pain or severe pain. 30 tablet 0 01/19/2023   lisinopril (ZESTRIL) 5 MG tablet Take 5 mg by mouth at bedtime.   01/19/2023   meloxicam (MOBIC) 15 MG tablet Take 15 mg by mouth daily.    01/19/2023   metoCLOPramide (REGLAN) 10 MG tablet Take 1 tablet (10 mg total) by mouth every 6  (six) hours as needed for nausea (nausea / vomiting). 10 tablet 0 01/19/2023   ondansetron (ZOFRAN) 8 MG tablet Take 8 mg by mouth 3 (three) times daily as needed for nausea or vomiting.   01/19/2023   rosuvastatin (CRESTOR) 20 MG tablet TAKE 1 TABLET BY MOUTH ONCE DAILY . APPOINTMENT REQUIRED FOR FUTURE REFILLS 30 tablet 0 01/19/2023   tamsulosin (FLOMAX) 0.4 MG CAPS capsule Take 0.4 mg by mouth daily.   01/19/2023   tiZANidine (ZANAFLEX) 4 MG tablet Take 1 tablet (4 mg total) by mouth 2 (two) times daily as needed for muscle spasms.   01/19/2023   Acetaminophen-Caffeine (EXCEDRIN ASPIRIN FREE PO) Take 1-2 tablets by mouth daily as needed (pain, headache).   More than a month   nitroGLYCERIN (NITROSTAT) 0.4 MG SL tablet Place 1 tablet (0.4 mg total) under the tongue every 5 (five) minutes x 3 doses as needed for chest pain. 25 tablet 2 Unknown   potassium chloride (KLOR-CON) 10 MEQ tablet Take 10 mEq by mouth 2 (two) times daily.   More than a month    Current Facility-Administered Medications  Medication Dose Route Frequency Provider Last Rate Last Admin   lactated ringers infusion   Intravenous Continuous Karie Soda, MD 10 mL/hr at 01/20/23 1054 Continued from Pre-op at 01/20/23 1054   lidocaine (XYLOCAINE) 2 % viscous mouth solution 15 mL  15 mL Mouth/Throat Once Karie Soda, MD       Facility-Administered Medications Ordered in Other Encounters  Medication Dose Route Frequency Provider Last Rate Last Admin   ondansetron (ZOFRAN) injection   Intravenous Anesthesia Intra-op Romie Minus K, CRNA   4 mg at 01/20/23 1052     Allergies  Allergen Reactions   Asa [Aspirin] Nausea Only    Stomach upset   Ditropan [Oxybutynin] Rash   Shellfish Allergy Swelling and Rash    BP (!) 149/74   Pulse (!) 57   Temp 97.9 F (36.6 C) (Temporal)   Resp 16   Ht 5\' 3"  (1.6 m)   Wt 86.6  kg   SpO2 98%   BMI 33.83 kg/m   Labs: No results found for this or any previous visit (from the past 48  hour(s)).  Imaging / Studies: No results found.   Ardeth Sportsman, M.D., F.A.C.S. Gastrointestinal and Minimally Invasive Surgery [facility] [address], [address] [place], [place] [address] [phone] Main / Paging  01/20/2023 11:39 AM    Ardeth Sportsman

## 2023-01-20 NOTE — Transfer of Care (Signed)
Immediate Anesthesia Transfer of Care Note  Patient: [name]  Procedure(s) Performed: ESOPHAGOGASTRODUODENOSCOPY (EGD) POSSIBLE DILATION, POSSIBLE BIOPSIES  Patient Location: Endoscopy Unit  Anesthesia Type:MAC  Level of Consciousness: sedated  Airway & Oxygen Therapy: Patient Spontanous Breathing and Patient connected to face mask oxygen  Post-op Assessment: Report given to RN and Post -op Vital signs reviewed and stable  Post vital signs: Reviewed  Last Vitals:  Vitals Value Taken Time  BP    Temp    Pulse    Resp    SpO2      Last Pain:  Vitals:   01/20/23 1020  TempSrc: Temporal  PainSc: 10-Worst pain ever         Complications: No notable events documented.

## 2023-01-20 NOTE — Op Note (Signed)
01/20/2023 12:30 PM  PATIENT:  [name]  67 y.o. female  Patient Care Team: Georgann Housekeeper, MD as PCP - General (Internal Medicine) Rollene Rotunda, MD as PCP - Cardiology (Cardiology) Jearl Klinefelter (Gastroenterology) Kerin Salen, MD as Consulting Physician (Gastroenterology) Karie Soda, MD as Consulting Physician (General Surgery)  PRE-OPERATIVE DIAGNOSIS:  DYSPHAGIA AFTER FUNDOPLICATION  POST-OPERATIVE DIAGNOSIS:  DYSPHAGIA AFTER FUNDOPLICATION  PROCEDURE:  ESOPHAGOGASTRODUODENOSCOPY (EGD) WITH DILATION  SURGEON:  Ardeth Sportsman, MD  ANESTHESIA:  Monitored Anesthesia Care (MAC) with IV sedation  Estimated Blood Loss (EBL):   Total I/O In: 700 [I.V.:700] Out: - .   (See anesthesia record)  Delay start of Pharmacological VTE agent (>24hrs) due to concerns of significant anemia, surgical blood loss, or risk of bleeding?:  no  DRAINS: (None)  SPECIMEN:  (no specimen)  DISPOSITION OF SPECIMEN:  Pathology and (not applicable)  COUNTS:  Sponge, needle, & instrument counts CORRECT  PLAN OF CARE: Discharge to home after PACU  PATIENT DISPOSITION:  PACU - hemodynamically stable.  INDICATION: Pleasant woman with large hiatal hernia requiring repair with partial posterior toupee fundoplication April 2024.  Still struggling with advancing diet.  I offered endoscopy and possible balloon dilation  The anatomy & physiology of the foregut and digestive tract was discussed.  Natural history risks without surgery was discussed.   The patient's situation is not adequately controlled by nonoperative interventions.  I feel the risks of no intervention will lead to serious problems that outweigh the operative risks; therefore, I recommended endoscopy to help diagnose and possibly treat abnormalities in the foregut system.  Need for preprocedural workup to rule out the differential diagnosis and plan treatment was explained.    Risks such as bleeding, infection,  abscess, leak, injury to other organs, need for repair of tissues / organs, need for further treatment, heart attack, death, and other risks were discussed.   I noted a good likelihood this will help address the problem.  Possible need for repeat dilations and endoscopy or endoscopic interventions discussed.  Possible need for surgical follow-up interventions goals of post-operative recovery were discussed as well.  Possibility that this will not correct all symptoms was explained.  We will work to minimize complications.   Educational handouts further explaining the pathology, treatment options, and dysphagia diet was given as well.  Questions were answered.  The patient expresses understanding & wishes to proceed.  OR FINDINGS:  Mildly dilated esophagus without any evidence of esophagitis.  Z-line at 35 cm.  Wrap 35-38 cm.  Omega shaped soft Toupet fundoplication.  No evidence of any stricturing nor recurrent hiatal hernias.  Balloon dilatation done 15-16.5-18  Minimal retained pill fragments in stomach.  No retained food.  J shaped stomach.  No gastritis.  No pyloric narrowing or stricturing.  DESCRIPTION: Informed consent was confirmed.  Patient placed in decubitus position with bite block in place.  Underwent MAC without difficulty.  Endoscope passed transorally without difficulty.  Hypopharynx without any irritation.  Passed in the esophagus without difficulty.  Mild dilation of esophagus more distally.  Some saliva and mucus but no retained food in the esophagus.  Mildly dilated distally.  Came to an intact fundoplication that was soft without any stricturing or narrowing.  No hiatal hernia noted.  Intubated stomach and pylorus.  Findings noted above.  Underwhelming.  Proceed with balloon dilatation.  15-18 balloon used.  Esophagogastric junction at site of fundoplication stretched 15 x 30 seconds, 16.5 x 30 seconds, 18 x 120  seconds.  No events.  Balloon withdrawn.  No evidence of injury or  bleeding.  Fundoplication wrap injected with Kenalog 40 dilated to 4 mL total.  1 mL x 4 quadrants.  Hemostasis good.  Repeated endoscopy saw no other abnormalities concern.  Insufflation aspirated as endoscope was withdrawn.  Patient Toller procedure well.  Patient stable in recovery.  Discussed recommending thin liquids through the weekend, pured/dysphagia diet x 2-week, readvance diet.  I discussed operative findings, updated the patient's status, discussed probable steps to recovery, and gave postoperative recommendations to the patient's significant other, [name] Younts .  Recommendations were made.  Questions were answered.  He expressed understanding & appreciation.     Ardeth Sportsman, M.D., F.A.C.S. Gastrointestinal and Minimally Invasive Surgery [facility] [address], [address] [place], [place] [address] [phone] Main / Paging

## 2023-01-20 NOTE — Anesthesia Preprocedure Evaluation (Addendum)
Anesthesia Evaluation  Patient identified by MRN, date of birth, ID band Patient awake    Reviewed: Allergy & Precautions, H&P , NPO status , Patient's Chart, lab work & pertinent test results  Airway Mallampati: II  TM Distance: >3 FB Neck ROM: Full    Dental no notable dental hx. (+) Dental Advisory Given   Pulmonary neg pulmonary ROS   Pulmonary exam normal breath sounds clear to auscultation       Cardiovascular hypertension, + CAD  Normal cardiovascular exam Rhythm:Regular Rate:Normal  Hx of tachy-brady syndrome   Neuro/Psych  Headaches PSYCHIATRIC DISORDERS Anxiety Depression       GI/Hepatic Neg liver ROS, hiatal hernia,GERD  ,,  Endo/Other  negative endocrine ROS    Renal/GU Renal disease  negative genitourinary   Musculoskeletal  (+) Arthritis ,  Fibromyalgia -  Abdominal   Peds negative pediatric ROS (+)  Hematology  (+) Blood dyscrasia, anemia   Anesthesia Other Findings   Reproductive/Obstetrics negative OB ROS                              Anesthesia Physical Anesthesia Plan  ASA: 3  Anesthesia Plan: MAC   Post-op Pain Management:    Induction: Intravenous  PONV Risk Score and Plan: Propofol infusion and Treatment may vary due to age or medical condition  Airway Management Planned: Natural Airway  Additional Equipment:   Intra-op Plan:   Post-operative Plan:   Informed Consent: I have reviewed the patients History and Physical, chart, labs and discussed the procedure including the risks, benefits and alternatives for the proposed anesthesia with the patient or authorized representative who has indicated his/her understanding and acceptance.     Dental advisory given  Plan Discussed with: CRNA  Anesthesia Plan Comments:          Anesthesia Quick Evaluation

## 2023-01-20 NOTE — H&P (Signed)
01/20/2023    PROVIDER: Jarrett Soho, MD  Patient Care Team: Georgann Housekeeper, MD as PCP - General (Internal Medicine) Michaell Cowing, Shawn Route, MD as Consulting Provider (General Surgery) Kerin Salen, MD (Gastroenterology) Rollene Rotunda, MD (Cardiovascular Disease)  [facility] MRN: [id number] DOB: 1955-07-12 DATE OF ENCOUNTER: 01/20/2023 4  Interval History:   The patient returns to the office after undergoing robotic reduction and repair of incarcerated hiatal hernia and Toupet fundoplication at [facility] on 08/09/2022  Pathology: Not applicable  Patient turns for second visit 3 postop. She seemed to be improving and then noted that she has had recurrent and persistent dysphagia. Cannot really advance past blenderized foods. Had called discussed about considering endoscopy with balloon dilatation if she was not better by 3 months. Call discussed with her last month. Recommended increasing Protonix to twice a day and going back to clears and gradually advancing. She is on a bunch of different antiacid medicines I recommend she stick to just 1 thing. I think she initially decided on Dexilant and then switch to Protonix. Seems like she was slowly getting better but then had an episode of chest pain that concerned her. Went to the emergency department the next week. No evidence of any hiatal hernia recurrence on CAT scan 10/16/2022. Concern of some thickening and inflammation of the colon like possible colitis. Her weight is 193, three pounds down from when I saw her 2 months ago. He is also been having worsening back pain issues and been following up with her spine team at [facility].  PRIOR NOTE 09/05/2022 Patient returns for first postop follow-up. She had a chronic hiatal hernia. Saw Dr. Daphine Deutscher with our group. Had nausea vomiting for many days. Admitted and hydrated. Underwent repair during hospitalization. Had some swelling and tight wrap at first. Improved with diuresis and steroids and  discharged postop day 6.  She comes today with her husband. She is disappointed she is not normal yet. She continues on her hydrocodone and Cymbalta for chronic pain. She is still having dysphagia beyond pured foods. No vomiting or retching. Occasional nausea. Reglan helps. Drinking about 3 supplemental shakes a day. Taking MiraLAX every day. Having a loose bowel movement every day or so. No fevers or chills. Her appetite is slowly improving. She is able to swallow pills now, no longer having to crush them. That is an improvement. Denies any burping or belching. She continues on antiacid medicines. She thinks she is taking Dexilant and Pepcid. Records note she is taking Protonix as well perhaps. She is not able to be more active and about running errands. She has not gone back on her [place] exercise machine much yet. She is used to putting on headphones and exercising up to an hour a day or going for long walks in the evening.  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 ROBOTIC reduction of paraesophageal hiatal hernia 2. Type II mediastinal dissection. 3. Primary repair of hiatal hernia over pledgets.  4. Anterior & posterior gastropexy. 5. Toupet (270 degree partial posterior x 4cm) fundoplication 6. Mesh reinforcement with absorbable mesh  SURGEON: Ardeth Sportsman, MD  OR FINDINGS:   Moderate-sized paraesophageal  hiatal hernia with 50% of the stomach in the mediastinum. There was a 11 x 9 cm hiatal defect.  It is a primary repair over pledgets. Mesh reinforcement was used with GORE BIO-A mesh, a biosynthetic web scaffold made  of 67% polyglycolic acid (PGA): 33% trimethylene carbonate (TMC).  The patient has a Toupet (270 degree partial posterior x 4cm) fundoplication. The patient has had anterior and posterior gastropexy.  PATHOLOGY:  Located in 'Care Everywhere' section of Epic EMR chart  Physical Examination:   Body mass index is 34.19 kg/m.  Constitutional: Not cachectic. Hygeine adequate. Sitting up smiling. This is the best I have seen her. Eyes: Normal extraocular movements. Sclera nonicteric Neuro: No major focal sensory defects. No major motor deficits. Psych: No severe agitation. No severe anxiety. Judgment & insight Adequate, Oriented x4, HENT: Normocephalic, Mucus membranes moist. No thrush.  Neck: Supple, No tracheal deviation.  Chest: Good respiratory excursion. No audible wheezing CV: No major extremity edema Ext: No obvious deformity or contracture. Edema: not present. No cyanosis Skin: Warm and dry Musculoskeletal: Mobility: uses cane moves with minimal assistance  Abdomen: Incisions Clean & dry with normal healing ridge mild discomfort epigastric but no guarding. Rest of the abdomen is Nontender. Soft. Mildly distended.   Gen: Inguinal hernia: Not present. Inguinal lymph nodes: without lymphadenopathy.   Rectal: (Deferred)    Assessment and Plan:   Lazaya Prisbrey is a 67 y.o. 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  Diagnoses and all orders for this visit:  Esophageal dysphagia  Chronic narcotic dependence (CMS/HHS-HCC)  Chronic nausea  Hx of hiatal hernia  Hiatal hernia - predniSONE (DELTASONE) 10 MG tablet; Take 1 tablet (10 mg total) by mouth as directed Take 4 pills for 2 days, then 3 pills for 2 days, then 2 pills for 2 days, then 1 pill daily for 10 days  Gastric  volvulus - ondansetron (ZOFRAN-ODT) 4 MG disintegrating tablet; Take 1 tablet (4 mg total) by mouth every 8 (eight) hours as needed for Nausea  Incarcerated hiatal hernia - ondansetron (ZOFRAN-ODT) 4 MG disintegrating tablet; Take 1 tablet (4 mg total) by mouth every 8 (eight) hours as needed for Nausea  Other orders - metoclopramide (REGLAN) 5 MG tablet; Take 1 tablet (5 mg total) by mouth every 12 (twelve) hours as needed (nausea, bloating)   Recurrent/persistent dysphagia most likely due to the wrap being too tight. No evidence of hiatal hernia recurrence. [place] the patient with chronic pain and spine issues and nausea.  Continue nausea control. Renewed her Zofran. Consider Reglan as well.  Continue antacid medication. She is just taking Dexilant at this point. Simple for now. Stop everything else.  Continue liquid/dysphagia 1/pured diet for now. While she is frustrated, she has been able to keep her weight so I do not think she is severely malnourished/dehydrated.  Retry steroid taper calm things down.  Set up EGD with probable balloon dilatation and Kenalog injection to better stretch out the wrap. Sometimes he need to do this 1-2 times but hopefully just 1 times in which she needs. She feels frustrated that she is not better and wishes to proceed. I do not think this can wait much longer so we will try and get this done urgently this month. Outpatient setting with endoscopy. The anatomy & physiology of the foregut and anti-reflux mechanism was discussed. The pathophysiology of  hiatal herniation and GERD was discussed. Natural history risks without surgery was discussed. The patient's symptoms are not adequately controlled by medicines and other non-operative treatments. Need for a thorough workup to rule out the differential diagnosis and plan treatment was explained. . I feel the risks of no intervention will lead to serious problems that outweigh the operative risks; therefore, I  recommended EGD esophagogastroduodenoscopy endoscopy with probable balloon versus standard dilatation. Possible biopsies. Possible steroid injection of the wrap.  Risks such as bleeding, infection, abscess, leak, injury to other organs, need for repair of tissues / organs, need for further treatment, stroke, heart attack, death, and other risks were discussed. I noted a good likelihood this will help address the problem. Noted sometimes repeated endoscopy/dilation is needed. Goals of post-operative recovery were discussed as well. Possibility that this will not correct all symptoms was explained. Post-operative dysphagia, need for short-term liquid & pureed diet, inability to vomit, possibility of reherniation, possible need for medicines to help control symptoms in addition to surgery were discussed. We will work to minimize complications. Educational handouts further explaining the pathology, treatment options, and dysphagia diet was given as well. Questions were answered. The patient expresses understanding & wishes to proceed with endoscopy.  No follow-ups on file.  The plan was discussed in detail with the patient today, who expressed understanding & appreciation. The patient has my contact information, and understands to call me with any additional questions or concerns in the interval. I would be happy to see the patient back sooner if the need arises.  Of note, portions of this report may have been transcribed using voice recognition software. Every effort was made to ensure accuracy; however, inadvertent computerized transcription errors may be present. Any transcriptional errors that result from this process are unintentional.  Ardeth Sportsman, MD, FACS, MASCRS Esophageal, Gastrointestinal & Colorectal Surgery Robotic and Minimally Invasive Surgery  [facility]  [address], [address] [place], [place] [address] [phone] Fax [phone] Main   01/20/2023 Ardeth Sportsman, MD, FACS, MASCRS Esophageal, Gastrointestinal & Colorectal Surgery Robotic and Minimally Invasive Surgery  [facility] [address], [address] [place], [place] [address] [phone] Fax [phone] Main  CONTACT INFORMATION: Weekday (9AM-5PM): Call [facility] main office at [phone] Weeknight (5PM-9AM) or Weekend/Holiday: Check EPIC "Web Links" tab & use "AMION" (password " [email or web address]") for General Surgery [facility] coverage  Please, DO NOT use SecureChat  (it is not reliable communication to reach operating surgeons & will lead to a delay in care).   Epic staff messaging available for outptient concerns needing 1-2 business day response.

## 2023-01-20 NOTE — Anesthesia Postprocedure Evaluation (Signed)
Anesthesia Post Note  Patient: [name]  Procedure(s) Performed: BALLOON DILATION SUBMUCOSAL INJECTION ESOPHAGOGASTRODUODENOSCOPY (EGD) WITH PROPOFOL     Patient location during evaluation: PACU Anesthesia Type: MAC Level of consciousness: awake and alert Pain management: pain level controlled Vital Signs Assessment: post-procedure vital signs reviewed and stable Respiratory status: spontaneous breathing, nonlabored ventilation, respiratory function stable and patient connected to nasal cannula oxygen Cardiovascular status: stable and blood pressure returned to baseline Postop Assessment: no apparent nausea or vomiting Anesthetic complications: no   No notable events documented.  Last Vitals:  Vitals:   01/20/23 1300 01/20/23 1310  BP: 129/61 (!) 123/59  Pulse: 61 (!) 59  Resp: 15 13  Temp:    SpO2: 94% 94%    Last Pain:  Vitals:   01/20/23 1310  TempSrc:   PainSc: 0-No pain                 [place]

## 2023-01-20 NOTE — Anesthesia Procedure Notes (Signed)
Procedure Name: MAC Date/Time: 01/20/2023 12:08 PM  Performed by: Lovie Chol, CRNAPre-anesthesia Checklist: Patient identified, Emergency Drugs available, Suction available, Patient being monitored and Timeout performed Oxygen Delivery Method: Simple face mask

## 2023-01-20 NOTE — Discharge Instructions (Signed)
EATING AFTER YOUR ESOPHAGEAL SURGERY (Stomach Fundoplication, Hiatal Hernia repair, Achalasia surgery, etc)  ######################################################################  EAT Start with a thin & then a pureed / full liquid diet (see below) Gradually transition to a high fiber diet with a fiber supplement over the next month after discharge.    WALK Walk an hour a day.  Control your pain to do that.    CONTROL PAIN Control pain so that you can walk, sleep, tolerate sneezing/coughing, go up/down stairs.  HAVE A BOWEL MOVEMENT DAILY Keep your bowels regular to avoid problems.  OK to try a laxative to override constipation.  OK to use an antidairrheal to slow down diarrhea.  Call if not better after 2 tries  CALL IF YOU HAVE PROBLEMS/CONCERNS Call if you are still struggling despite following these instructions. Call if you have concerns not answered by these instructions  ######################################################################   After your EGD endoscopy and stretching of your abdomen, expect some sticking with swallowing over the next 1-2 months.    If food sticks when you eat, it is called "dysphagia".  This is due to swelling around your esophagus at the wrap & hiatal diaphragm repair.  It will gradually ease off over the next few months.    To help you through this temporary phase, we start you out on a thin liquid diet through this weekend.    Then start advancing to a pureed (blenderized) diet.  Stay on a pureed diet for the first 2-3 weeks to avoid anything getting "stuck" near your recent surgery.  Don't be alarmed if your ability to swallow doesn't progress according to this plan.  Everyone is different and some diets can advance more or less quickly.    It is often helpful to crush your medications or split them as they can sometimes stick, especially the first week or so.   Some BASIC RULES to follow are: Maintain an upright position whenever  eating or drinking. Take small bites - just a teaspoon size bite at a time. Eat slowly.  It may also help to eat only one food at a time. Consider nibbling through smaller, more frequent meals & avoid the urge to eat BIG meals Do not push through feelings of fullness, nausea, or bloatedness Do not mix solid foods and liquids in the same mouthful Try not to "wash foods down" with large gulps of liquids. Avoid carbonated (bubbly/fizzy) drinks.   Avoid foods that make you feel gassy or bloated.  Start with bland foods first.  Wait on trying greasy, fried, or spicy meals until you are tolerating more bland solids well. Understand that it will be hard to burp and belch at first.  This gradually improves with time.  Expect to be more gassy/flatulent/bloated initially.  Walking will help your body manage it better. Consider using medications for bloating that contain simethicone such as  Maalox or Gas-X  Consider crushing her medications, especially smaller pills.  The ability to swallow pills should get easier after a few weeks Eat in a relaxed atmosphere & minimize distractions. Avoid talking while eating.   Do not use straws. Following each meal, sit in an upright position (90 degree angle) for 60 to 90 minutes.  Going for a short walk can help as well If food does stick, don't panic.  Try to relax and let the food pass on its own.  Sipping WARM LIQUID such as strong hot black tea can also help slide it down.   Be gradual in changes &  use common sense:  -If you easily tolerating a certain "level" of foods, advance to the next level gradually -If you are having trouble swallowing a particular food, then avoid it.   -If food is sticking when you advance your diet, go back to thinner previous diet (the lower LEVEL) for 1-2 days.  LEVEL 1 = PUREED DIET  Do for the first 2 WEEKS AFTER SURGERY  -Foods in this group are pureed or blenderized to a smooth, mashed potato-like consistency.  -If  necessary, the pureed foods can keep their shape with the addition of a thickening agent.   -Meat should be pureed to a smooth, pasty consistency.  Hot broth or gravy may be added to the pureed meat, approximately 1 oz. of liquid per 3 oz. serving of meat. -CAUTION:  If any foods do not puree into a smooth consistency, swallowing will be more difficult.  (For example, nuts or seeds sometimes do not blend well.)  Hot Foods Cold Foods  Pureed scrambled eggs and cheese Pureed cottage cheese  Baby cereals Thickened juices and nectars  Thinned cooked cereals (no lumps) Thickened milk or eggnog  Pureed [place] toast or pancakes Ensure  Mashed potatoes Ice cream  Pureed parsley, au gratin, scalloped potatoes, candied sweet potatoes Fruit or [place] ice, sherbet  Pureed buttered or alfredo noodles Plain yogurt  Pureed vegetables (no corn or peas) Instant breakfast  Pureed soups and creamed soups Smooth pudding, mousse, custard  Pureed scalloped apples Whipped gelatin  Gravies Sugar, syrup, honey, jelly  Sauces, cheese, tomato, barbecue, white, creamed Cream  Any baby food Creamer  Alcohol in moderation (not beer or champagne) Margarine  Coffee or tea Mayonnaise   Ketchup, mustard   Apple sauce   SAMPLE MENU:  PUREED DIET Breakfast Lunch Dinner  Orange juice, 1/2 cup Cream of wheat, 1/2 cup Pineapple juice, 1/2 cup Pureed [place], barley soup, 3/4 cup Pureed Hawaiian chicken, 3 oz  Scrambled eggs, mashed or blended with cheese, 1/2 cup Tea or coffee, 1 cup  Whole milk, 1 cup  Non-dairy creamer, 2 Tbsp. Mashed potatoes, 1/2 cup Pureed cooled broccoli, 1/2 cup Apple sauce, 1/2 cup Coffee or tea Mashed potatoes, 1/2 cup Pureed spinach, 1/2 cup Frozen yogurt, 1/2 cup Tea or coffee      LEVEL 2 = SOFT DIET  After your first 2 weeks, you can advance to a soft diet.   Keep on this diet until everything goes down easily.  Hot Foods Cold Foods  White fish Cottage cheese  Stuffed fish  Junior baby fruit  Baby food meals Semi thickened juices  Minced soft cooked, scrambled, poached eggs nectars  Souffle & omelets Ripe mashed bananas  Cooked cereals Canned fruit, pineapple sauce, milk  potatoes Milkshake  Buttered or Alfredo noodles Custard  Cooked cooled vegetable Puddings, including tapioca  Sherbet Yogurt  Vegetable soup or alphabet soup Fruit ice, [place] ice  Gravies Whipped gelatin  Sugar, syrup, honey, jelly Junior baby desserts  Sauces:  Cheese, creamed, barbecue, tomato, white Cream  Coffee or tea Margarine   SAMPLE MENU:  LEVEL 2 Breakfast Lunch Dinner  Orange juice, 1/2 cup Oatmeal, 1/2 cup Scrambled eggs with cheese, 1/2 cup Decaffeinated tea, 1 cup Whole milk, 1 cup Non-dairy creamer, 2 Tbsp Pineapple juice, 1/2 cup Minced beef, 3 oz Gravy, 2 Tbsp Mashed potatoes, 1/2 cup Minced fresh broccoli, 1/2 cup Applesauce, 1/2 cup Coffee, 1 cup [place], barley soup, 3/4 cup Minced Hawaiian chicken, 3 oz Mashed potatoes, 1/2 cup Cooked  spinach, 1/2 cup Frozen yogurt, 1/2 cup Non-dairy creamer, 2 Tbsp      LEVEL 3 = CHOPPED DIET  -After all the foods in level 2 (soft diet) are passing through well you should advance up to more chopped foods.  -It is still important to cut these foods into small pieces and eat slowly.  Hot Foods Cold Foods  Poultry Cottage cheese  Chopped Swedish meatballs Yogurt  Meat salads (ground or flaked meat) Milk  Flaked fish (tuna) Milkshakes  Poached or scrambled eggs Soft, cold, dry cereal  Souffles and omelets Fruit juices or nectars  Cooked cereals Chopped canned fruit  Chopped [place] toast or pancakes Canned fruit cocktail  Noodles or pasta (no rice) Pudding, mousse, custard  Cooked vegetables (no frozen peas, corn, or mixed vegetables) Green salad  Canned small sweet peas Ice cream  Creamed soup or vegetable soup Fruit ice, [place] ice  Pureed vegetable soup or alphabet soup Non-dairy creamer  Ground scalloped  apples Margarine  Gravies Mayonnaise  Sauces:  Cheese, creamed, barbecue, tomato, white Ketchup  Coffee or tea Mustard   SAMPLE MENU:  LEVEL 3 Breakfast Lunch Dinner  Orange juice, 1/2 cup Oatmeal, 1/2 cup Scrambled eggs with cheese, 1/2 cup Decaffeinated tea, 1 cup Whole milk, 1 cup Non-dairy creamer, 2 Tbsp Ketchup, 1 Tbsp Margarine, 1 tsp Salt, 1/4 tsp Sugar, 2 tsp Pineapple juice, 1/2 cup Ground beef, 3 oz Gravy, 2 Tbsp Mashed potatoes, 1/2 cup Cooked spinach, 1/2 cup Applesauce, 1/2 cup Decaffeinated coffee Whole milk Non-dairy creamer, 2 Tbsp Margarine, 1 tsp Salt, 1/4 tsp Pureed [place], barley soup, 3/4 cup Barbecue chicken, 3 oz Mashed potatoes, 1/2 cup Ground fresh broccoli, 1/2 cup Frozen yogurt, 1/2 cup Decaffeinated tea, 1 cup Non-dairy creamer, 2 Tbsp Margarine, 1 tsp Salt, 1/4 tsp Sugar, 1 tsp    LEVEL 4:  REGULAR FOODS  -Foods in this group are soft, moist, regularly textured foods.   -This level includes meat and breads, which tend to be the hardest things to swallow.   -Eat very slowly, chew well and continue to avoid carbonated drinks. -most people are at this level in 4-6 weeks  Hot Foods Cold Foods  Baked fish or skinned Soft cheeses - cottage cheese  Souffles and omelets Cream cheese  Eggs Yogurt  Stuffed shells Milk  Spaghetti with meat sauce Milkshakes  Cooked cereal Cold dry cereals (no nuts, dried fruit, coconut)  [place] toast or pancakes Crackers  Buttered toast Fruit juices or nectars  Noodles or pasta (no rice) Canned fruit  Potatoes (all types) Ripe bananas  Soft, cooked vegetables (no corn, lima, or baked beans) Peeled, ripe, fresh fruit  Creamed soups or vegetable soup Cakes (no nuts, dried fruit, coconut)  Canned chicken noodle soup Plain doughnuts  Gravies Ice cream  Bacon dressing Pudding, mousse, custard  Sauces:  Cheese, creamed, barbecue, tomato, white Fruit ice, [place] ice, sherbet  Decaffeinated tea or coffee  Whipped gelatin  Pork chops Regular gelatin   Canned fruited gelatin molds   Sugar, syrup, honey, jam, jelly   Cream   Non-dairy   Margarine   Oil   Mayonnaise   Ketchup   Mustard   TROUBLESHOOTING IRREGULAR BOWELS  1) Avoid extremes of bowel movements (no bad constipation/diarrhea)  2) Miralax 17gm mixed in 8oz. water or juice-daily. May use BID as needed.  3) Gas-x,Phazyme, etc. as needed for gas & bloating.  4) Soft,bland diet. No spicy,greasy,fried foods.  5) Prilosec over-the-counter as needed  6) May  hold gluten/wheat products from diet to see if symptoms improve.  7) May try probiotics (Align, Activa, etc) to help calm the bowels down  7) If symptoms become worse call back immediately.    If you have any questions please call our office at [facility]: [phone].

## 2023-01-23 ENCOUNTER — Encounter (HOSPITAL_COMMUNITY): Payer: Self-pay | Admitting: Surgery

## 2023-01-30 DIAGNOSIS — M961 Postlaminectomy syndrome, not elsewhere classified: Secondary | ICD-10-CM | POA: Diagnosis not present

## 2023-01-30 DIAGNOSIS — Z79899 Other long term (current) drug therapy: Secondary | ICD-10-CM | POA: Diagnosis not present

## 2023-01-30 DIAGNOSIS — M5416 Radiculopathy, lumbar region: Secondary | ICD-10-CM | POA: Diagnosis not present

## 2023-01-30 DIAGNOSIS — Z5181 Encounter for therapeutic drug level monitoring: Secondary | ICD-10-CM | POA: Diagnosis not present

## 2023-01-31 DIAGNOSIS — H16223 Keratoconjunctivitis sicca, not specified as Sjogren's, bilateral: Secondary | ICD-10-CM | POA: Diagnosis not present

## 2023-02-07 DIAGNOSIS — H26493 Other secondary cataract, bilateral: Secondary | ICD-10-CM | POA: Diagnosis not present

## 2023-02-09 ENCOUNTER — Ambulatory Visit: Payer: Medicare Other

## 2023-03-01 ENCOUNTER — Ambulatory Visit
Admission: RE | Admit: 2023-03-01 | Discharge: 2023-03-01 | Disposition: A | Discharge status: Home / Self Care | Payer: Medicare Other | Source: Ambulatory Visit | Attending: Internal Medicine | Admitting: Internal Medicine

## 2023-03-01 DIAGNOSIS — Z1231 Encounter for screening mammogram for malignant neoplasm of breast: Secondary | ICD-10-CM

## 2023-03-03 ENCOUNTER — Other Ambulatory Visit: Payer: Self-pay | Admitting: Internal Medicine

## 2023-03-03 DIAGNOSIS — R928 Other abnormal and inconclusive findings on diagnostic imaging of breast: Secondary | ICD-10-CM

## 2023-03-07 ENCOUNTER — Other Ambulatory Visit: Payer: Self-pay | Admitting: Internal Medicine

## 2023-03-07 ENCOUNTER — Ambulatory Visit
Admission: RE | Admit: 2023-03-07 | Discharge: 2023-03-07 | Disposition: A | Discharge status: Home / Self Care | Payer: Medicare Other | Source: Ambulatory Visit | Attending: Internal Medicine | Admitting: Internal Medicine

## 2023-03-07 DIAGNOSIS — R599 Enlarged lymph nodes, unspecified: Secondary | ICD-10-CM

## 2023-03-07 DIAGNOSIS — R928 Other abnormal and inconclusive findings on diagnostic imaging of breast: Secondary | ICD-10-CM

## 2023-03-07 DIAGNOSIS — R590 Localized enlarged lymph nodes: Secondary | ICD-10-CM | POA: Diagnosis not present

## 2023-03-09 ENCOUNTER — Other Ambulatory Visit (HOSPITAL_COMMUNITY): Payer: Self-pay | Admitting: Surgery

## 2023-03-09 ENCOUNTER — Ambulatory Visit
Admission: RE | Admit: 2023-03-09 | Discharge: 2023-03-09 | Disposition: A | Discharge status: Home / Self Care | Payer: Medicare Other | Source: Ambulatory Visit | Attending: Internal Medicine | Admitting: Internal Medicine

## 2023-03-09 DIAGNOSIS — Z8719 Personal history of other diseases of the digestive system: Secondary | ICD-10-CM

## 2023-03-09 DIAGNOSIS — R1319 Other dysphagia: Secondary | ICD-10-CM

## 2023-03-09 DIAGNOSIS — R928 Other abnormal and inconclusive findings on diagnostic imaging of breast: Secondary | ICD-10-CM

## 2023-03-09 DIAGNOSIS — M5416 Radiculopathy, lumbar region: Secondary | ICD-10-CM | POA: Diagnosis not present

## 2023-03-09 DIAGNOSIS — R599 Enlarged lymph nodes, unspecified: Secondary | ICD-10-CM

## 2023-03-09 DIAGNOSIS — R590 Localized enlarged lymph nodes: Secondary | ICD-10-CM | POA: Diagnosis not present

## 2023-03-09 DIAGNOSIS — M7989 Other specified soft tissue disorders: Secondary | ICD-10-CM | POA: Diagnosis not present

## 2023-03-10 ENCOUNTER — Other Ambulatory Visit (HOSPITAL_COMMUNITY): Payer: Self-pay | Admitting: *Deleted

## 2023-03-10 DIAGNOSIS — R1310 Dysphagia, unspecified: Secondary | ICD-10-CM

## 2023-03-10 LAB — SURGICAL PATHOLOGY

## 2023-03-13 ENCOUNTER — Ambulatory Visit (HOSPITAL_COMMUNITY)
Admission: RE | Admit: 2023-03-13 | Discharge: 2023-03-13 | Disposition: A | Discharge status: Home / Self Care | Payer: Medicare Other | Source: Ambulatory Visit | Attending: Internal Medicine

## 2023-03-13 ENCOUNTER — Other Ambulatory Visit: Payer: Self-pay | Admitting: Internal Medicine

## 2023-03-13 ENCOUNTER — Ambulatory Visit (HOSPITAL_COMMUNITY)
Admission: RE | Admit: 2023-03-13 | Discharge: 2023-03-13 | Disposition: A | Discharge status: Home / Self Care | Payer: Medicare Other | Source: Ambulatory Visit | Attending: Surgery | Admitting: Surgery

## 2023-03-13 ENCOUNTER — Ambulatory Visit (HOSPITAL_COMMUNITY)
Admission: RE | Admit: 2023-03-13 | Discharge: 2023-03-13 | Disposition: A | Discharge status: Home / Self Care | Payer: Medicare Other | Source: Ambulatory Visit | Attending: Internal Medicine | Admitting: Internal Medicine

## 2023-03-13 ENCOUNTER — Encounter (HOSPITAL_COMMUNITY): Payer: Medicare Other

## 2023-03-13 ENCOUNTER — Other Ambulatory Visit (HOSPITAL_COMMUNITY): Payer: Self-pay | Admitting: Surgery

## 2023-03-13 DIAGNOSIS — H547 Unspecified visual loss: Secondary | ICD-10-CM | POA: Diagnosis not present

## 2023-03-13 DIAGNOSIS — R1319 Other dysphagia: Secondary | ICD-10-CM

## 2023-03-13 DIAGNOSIS — K449 Diaphragmatic hernia without obstruction or gangrene: Secondary | ICD-10-CM | POA: Diagnosis not present

## 2023-03-13 DIAGNOSIS — R1310 Dysphagia, unspecified: Secondary | ICD-10-CM | POA: Insufficient documentation

## 2023-03-13 DIAGNOSIS — Z8719 Personal history of other diseases of the digestive system: Secondary | ICD-10-CM | POA: Diagnosis not present

## 2023-03-13 DIAGNOSIS — K224 Dyskinesia of esophagus: Secondary | ICD-10-CM | POA: Diagnosis not present

## 2023-03-13 DIAGNOSIS — R590 Localized enlarged lymph nodes: Secondary | ICD-10-CM

## 2023-03-13 NOTE — Progress Notes (Signed)
Modified Barium Swallow Study  Patient Details  Name: [name] MRN: [id number] Date of Birth: 1955-09-13  Today's Date: 03/13/2023  Modified Barium Swallow completed.  Full report located under Chart Review in the Imaging Section.  History of Present Illness [name] is a 67 yo female presenting for an OP MBS. Pt reports she was previously on a full liquid diet after hiatal hernia repair, only recently progressing to softened foods (smoothies, overcooked noodles, purees) and takes meds crushed in purees. Seen in ED 10/16/22 with three day history of difficulty swallowing due to globus sensation s/p incarcerated hiatal hernia after repair. Underwent EGD with dilation 01/20/23. PMH includes anemia, anxiety, arthritis, CAD, CKD, depression, GERD, HTN, migraine   Clinical Impression Pt presents with a mild oropharyngeal dysphagia characterized by mistiming and seemingly reduced strength. She is unable to achieve complete tongue base retraction which contributes to pharyngeal residue. The head of the bolus consistently reaches the level of the pyriform sinus prior complete laryngeal elevation. This coupled with pt's slow, yet complete epiglottic inversion result in trace penetration with all liquid consistencies administered. With thin liquids, penetrates did not clear until pt performed a cued subswallow which was additionally effective in clearance of pharyngeal residue. While this is considered WFL, suspect this has the potential to impact pt's presentation in times of deconditioning. Given pt's current baseline diet, SLP did not administer regular texture solids or the pill this date due to pt preference. Provided education regarding esophageal precautions and recommendations for f/u ST in the event of deconditioning. Factors that may increase risk of adverse event in presence of aspiration Rubye Oaks & Clearance Coots 2021): Limited mobility  Swallow Evaluation Recommendations Recommendations:  PO diet PO Diet Recommendation: Regular;Thin liquids (Level 0) Liquid Administration via: Cup;Straw Medication Administration: Crushed with puree Supervision: Patient able to self-feed Swallowing strategies  : Minimize environmental distractions;Slow rate;Small bites/sips;Multiple dry swallows after each bite/sip Postural changes: Position pt fully upright for meals;Stay upright 30-60 min after meals Oral care recommendations: Oral care BID (2x/day)      Gwynneth Aliment, M.A., CF-SLP Speech Language Pathology, Acute Rehabilitation Services  Secure Chat preferred [phone]  03/13/2023,11:51 AM

## 2023-03-20 ENCOUNTER — Encounter (HOSPITAL_COMMUNITY): Payer: Medicare Other

## 2023-03-20 ENCOUNTER — Encounter (HOSPITAL_COMMUNITY): Payer: Self-pay

## 2023-03-20 DIAGNOSIS — H5340 Unspecified visual field defects: Secondary | ICD-10-CM | POA: Diagnosis not present

## 2023-03-22 ENCOUNTER — Other Ambulatory Visit: Payer: Self-pay | Admitting: Ophthalmology

## 2023-03-22 DIAGNOSIS — H5340 Unspecified visual field defects: Secondary | ICD-10-CM

## 2023-03-22 DIAGNOSIS — H547 Unspecified visual loss: Secondary | ICD-10-CM

## 2023-03-23 ENCOUNTER — Ambulatory Visit: Payer: Medicare Other | Admitting: Speech Pathology

## 2023-03-28 ENCOUNTER — Ambulatory Visit: Payer: Medicare Other | Attending: Surgery | Admitting: Speech Pathology

## 2023-03-28 ENCOUNTER — Encounter: Payer: Self-pay | Admitting: Speech Pathology

## 2023-03-28 DIAGNOSIS — H5340 Unspecified visual field defects: Secondary | ICD-10-CM | POA: Diagnosis not present

## 2023-03-28 DIAGNOSIS — R1312 Dysphagia, oropharyngeal phase: Secondary | ICD-10-CM | POA: Diagnosis not present

## 2023-03-28 DIAGNOSIS — H547 Unspecified visual loss: Secondary | ICD-10-CM | POA: Diagnosis not present

## 2023-03-28 NOTE — Therapy (Signed)
OUTPATIENT SPEECH LANGUAGE PATHOLOGY SWALLOW EVALUATION   Patient Name: [name] MRN: [id number] DOB:Aug 03, 1955, 67 y.o., female Today's Date: 03/28/2023  PCP: Georgann Housekeeper, MD REFERRING PROVIDER: Karie Soda, MD  END OF SESSION:  End of Session - 03/28/23 1245     Visit Number 1    Number of Visits 1    SLP Start Time 1230    SLP Stop Time  1315    SLP Time Calculation (min) 45 min    Activity Tolerance Patient tolerated treatment well             Past Medical History:  Diagnosis Date   Anemia    Anxiety    Arthritis    "all over" (11/16/2016)   CAD (coronary artery disease)    Chronic lower back pain    Chronic pain syndrome    "all over" (11/16/2016)   CKD (chronic kidney disease), stage III (HCC)    Depression    Fibromyalgia    GERD (gastroesophageal reflux disease)    History of blood transfusion    "related to hand laceration"   History of hiatal hernia    History of stomach ulcers    "bled when I was younger" (11/16/2016)   Hypercholesterolemia    Hypertension    hx; "they took me off my RX" (11/16/2016)   Migraine 1970s   Phlebitis of leg, right    "years ago" (11/16/2016)   Radiculopathy    Past Surgical History:  Procedure Laterality Date   ABDOMINAL HYSTERECTOMY  1993   w/left ovary   ANKLE FRACTURE SURGERY Bilateral    APPENDECTOMY  1977   BACK SURGERY     BALLOON DILATION  01/20/2023   Procedure: BALLOON DILATION;  Surgeon: Karie Soda, MD;  Location: [facility];  Service: General;;   CARDIAC CATHETERIZATION  2005   "negative"   CHOLECYSTECTOMY OPEN  1977   ESOPHAGOGASTRODUODENOSCOPY  2008   dx'd GERD   ESOPHAGOGASTRODUODENOSCOPY (EGD) WITH PROPOFOL  01/20/2023   Procedure: ESOPHAGOGASTRODUODENOSCOPY (EGD) WITH PROPOFOL;  Surgeon: Karie Soda, MD;  Location: [facility];  Service: General;;   FOREARM FRACTURE SURGERY Right    HIP SURGERY Bilateral    "took out bone spurs"   INSERTION OF MESH N/A 08/09/2022   Procedure:  INSERTION OF MESH;  Surgeon: Karie Soda, MD;  Location: [facility];  Service: General;  Laterality: N/A;   JOINT REPLACEMENT     KNEE ARTHROSCOPY Bilateral    "total of 18 times"   LACERATION REPAIR Right    hand; "went thru glass door"   LEFT HEART CATH AND CORONARY ANGIOGRAPHY N/A 11/18/2016   Procedure: Left Heart Cath and Coronary Angiography;  Surgeon: Yvonne Kendall, MD;  Location: [facility];  Service: Cardiovascular;  Laterality: N/A;   LEFT HEART CATH AND CORONARY ANGIOGRAPHY N/A 03/16/2020   Procedure: LEFT HEART CATH AND CORONARY ANGIOGRAPHY;  Surgeon: Yvonne Kendall, MD;  Location: [facility];  Service: Cardiovascular;  Laterality: N/A;   LUMBAR FUSION     "put rods in"   LUMBAR LAMINECTOMY/DECOMPRESSION MICRODISCECTOMY  05/2006   OPEN REDUCTION INTERNAL FIXATION (ORIF) DISTAL RADIAL FRACTURE Left 06/18/2018   Procedure: OPEN REDUCTION INTERNAL FIXATION (ORIF) DISTAL RADIAL FRACTURE repair and/or reconstruction;  Surgeon: Bradly Bienenstock, MD;  Location: [facility];  Service: Orthopedics;  Laterality: Left;   REVISION TOTAL KNEE ARTHROPLASTY Bilateral    RIGHT OOPHORECTOMY Right    SHOULDER OPEN ROTATOR CUFF REPAIR Bilateral    left 04/2006   SUBMUCOSAL INJECTION  01/20/2023   Procedure: SUBMUCOSAL INJECTION;  Surgeon: Karie Soda, MD;  Location: [facility];  Service: General;;   TONSILLECTOMY     TOTAL KNEE ARTHROPLASTY Bilateral    left 1998; right 2004   TUBAL LIGATION     XI ROBOTIC ASSISTED HIATAL HERNIA REPAIR N/A 08/09/2022   Procedure: XI ROBOTIC ASSISTED HIATAL HERNIA REPAIR WITH PARTIAL TOUPET FUNDOPLICATION;  Surgeon: Karie Soda, MD;  Location: [facility];  Service: General;  Laterality: N/A;   Patient Active Problem List   Diagnosis Date Noted   Abnormal gait 08/10/2022   Paraparesis (HCC) 08/10/2022   Incarcerated hiatal hernia 08/03/2022   Iron deficiency anemia 02/07/2022   Low back pain 12/07/2021   Recurrent falls 12/07/2021   Muscle weakness  04/14/2021   Lumbar post-laminectomy syndrome 08/14/2020   Accelerating angina (HCC) 03/16/2020   Microcytic hypochromic anemia 06/15/2018   Fracture of left wrist 06/15/2018   Lumbar spondylosis 04/27/2017   Cervical radiculopathy 04/27/2017   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   Coronary artery disease involving native coronary artery of native heart with angina pectoris (HCC)    Pure hypercholesterolemia    Chest pain with moderate risk for cardiac etiology    Prolonged QT interval    Bradycardia    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 At beginning of session, pt asked if "this was a one time thing". SLP explained evaluation and treatment protocol. Pt lives 1.5-2 hours away currently. SLP found outpatient facility closer to home for patient. Pt to complete evaluation today and would like to follow up with treatment closer to home.    Pt accompanied by: significant other; [name]  PERTINENT HISTORY: Previously on a full liquid diet after hiatal hernia repair, recent progression to softened foods (smoothies, overcooked noodles, purees). Crushes meds. Seen in ED 10/16/22 with three day history of difficulty swallowing due to globus sensation s/p incarcerated hiatal hernia after repair. Underwent EGD with dilation 01/20/23. PMH includes anemia, anxiety, arthritis, CAD, CKD, depression, GERD, HTN, migraine   PAIN:  Are you having pain? Yes: NPRS scale: 7/10 Pain location: throat, stomach (and chronic pain) Pain description: throat - sore, "closed up"' stomach pain - bloated/gassy Aggravating factors:  swallowing, stomach pain Relieving factors: mylanta, medicines  FALLS: Has patient fallen in last 6 months?  No  LIVING ENVIRONMENT: Lives with: lives with their family; Smitty Cords Lives in: Other RV   PLOF:  Level of assistance: Independent with ADLs, Independent with IADLs Employment: Retired  PATIENT GOALS: swallowing  OBJECTIVE:  Note: Objective measures were completed at Evaluation unless otherwise noted. OBJECTIVE:   DIAGNOSTIC FINDINGS: MRI scheduled for 04/14/23  INSTRUMENTAL SWALLOW STUDY FINDINGS (MBSS) 03/13/23  Per chart review:  Clinical Impression Clinical Impression: Pt presents with a mild oropharyngeal dysphagia characterized by mistiming and seemingly reduced strength. She is unable to achieve complete tongue base retraction which contributes to pharyngeal residue. The head of the bolus consistently reaches the level of the pyriform sinus prior complete laryngeal elevation. This coupled with pt's slow, yet complete epiglottic inversion result in trace penetration with all liquid consistencies administered. With thin liquids, penetrates did not clear until pt performed a cued subswallow which was  additionally effective in clearance of pharyngeal residue. While this is considered WFL, suspect this has the potential to impact pt's presentation in times of deconditioning. Given pt's current baseline diet, SLP did not administer regular texture solids or the pill this date due to pt preference. Provided education regarding esophageal precautions and recommendations for f/u ST in the event of deconditioning.  Swallowing Evaluation Recommendations Recommendations: PO diet PO Diet Recommendation: Regular; Thin liquids (Level 0) Liquid Administration via: Cup; Straw Medication Administration: Crushed with puree Supervision: Patient able to self-feed Swallowing strategies  : Minimize environmental distractions; Slow rate; Small bites/sips; Multiple dry swallows after each  bite/sip Postural changes: Position pt fully upright for meals; Stay upright 30-60 min after meals Oral care recommendations: Oral care BID (2x/day)  COGNITION: Overall cognitive status: Within functional limits for tasks assessed Areas of impairment:  NA Functional deficits: NA  SUBJECTIVE DYSPHAGIA REPORTS:  Date of onset: since April Reported symptoms: coughing with liquids, choking with liquids, odynophagia, globus sensation, regurgitation, chest pain while eating, and heartburn  Current diet:  thin liquids and purees  Co-morbid voice changes: No  FACTORS WHICH MAY INCREASE RISK OF ADVERSE EVENT IN PRESENCE OF ASPIRATION:  General health: well appearing; reportedly deconditioned  Risk factors: none evident     ORAL MOTOR EXAMINATION: Overall status: WFL Comments: Unremarkable  CLINICAL SWALLOW ASSESSMENT:   Dentition: adequate natural dentition Vocal quality at baseline: normal Patient directly observed with POs: Yes: thin liquids  Feeding: able to feed self Liquids provided by: cup [facility] Swallow Protocol:  Did not complete due to recent MBS, in addition to pt reportedly feeling like she is "drowning" with large sips of water Oral phase signs and symptoms:  NA Pharyngeal phase signs and symptoms: multiple swallows, complaints of residue, and regurgitation ** When taking small sips of liquid, pt was observed to regurgitate liquids.   PATIENT REPORTED OUTCOME MEASURES (PROM): EAT-10: 40 (Max score indicating significantly reduced QOL)   PATIENT EDUCATION: Education details: MBS Results, SLP POC, Effortful swallow,  Person educated: Patient and Spouse Education method: Explanation, Demonstration, and Handouts Education comprehension: verbalized understanding and needs further education   ASSESSMENT:  CLINICAL IMPRESSION:  Pt is a 67 yo female who presents to OP ST with complaints of difficulty swallowing. Per chart review, pt status post repair of hiatal hernia with  partial fundoplication and endoscopy with balloon dilation and injection in 2024. Doc reports no notable esophagogastric issues. Pt swallowing was assessed via Modified Barium Swallow Study (MBSS) on 03/13/23. MBS results concluded re: reduced tongue base retraction, pharyngeal residue, trace penetration with all given consistencies, and esophageal retention. Pt declined solid trials at [facility], so these were not observed. See MBS report details above. Recommendations were made for regular diet with thin liquids following esophageal precautions. Per chart, it is suspected that swallowing deficits are related to deconditioning and history of esophageal problems. Pt reports she has lost ~35 pounds since April, but in the past month, weight has been maintained. Pt reports she has been having vision problems - blurry vision, watery, tired. She has a MRI scheduled to rule out neuro involvement.   Today, pt reports persistent swallowing difficulties re: food being regurgitated if drink/eat too fast "feels like she is drowning with large drinks", pain with swallowing (odynophagia), stomach pain with eating, food getting stuck, regurgitating food and drink.   SLP provided initial education and handout on effortful (hard) swallow to strengthen pharyngeal musculature to hopefully decrease amount of sensed residue in pharynx, as well  as improve timing and coordination of swallow. Pt was able to perform exercise before leaving today's assessment. SLP also provided pt with safe swallow handout to reinforce swallow safety.   Strategies given:  Small sips/bites 2-3 dry swallows per bite (or effortful swallows) to clear sensed pharyngeal residue Trying to space out meals - if feelings of discomfort occur, stopping meal, and returning ~1 hour to continue meal Universal Acid Reflux precautions   SLP recommends continued pharyngeal strengthening treatment, as well as continued GI follow up, as it seems as this problem is  multifactorial.   Due to patient distance from clinic, SLP assisted in finding rehab closer to home to continue treatment in [facility]. SLP to reach out to doctor for referral.    OBJECTIVE IMPAIRMENTS: include dysphagia. These impairments are limiting patient from safety when swallowing and quality of life . Factors affecting potential to achieve goals and functional outcome are pain level. Patient will benefit from skilled SLP services to address above impairments and improve overall function.  REHAB POTENTIAL: Good    PLAN:  SLP FREQUENCY: one time visit  SLP DURATION: other: NA  PLANNED INTERVENTIONS:  NA    [employer], CCC-SLP 03/28/2023, 12:46 PM

## 2023-03-31 DIAGNOSIS — R5383 Other fatigue: Secondary | ICD-10-CM | POA: Diagnosis not present

## 2023-04-14 ENCOUNTER — Ambulatory Visit
Admission: RE | Admit: 2023-04-14 | Discharge: 2023-04-14 | Disposition: A | Discharge status: Home / Self Care | Payer: Medicare Other | Source: Ambulatory Visit | Attending: Ophthalmology

## 2023-04-14 DIAGNOSIS — H547 Unspecified visual loss: Secondary | ICD-10-CM

## 2023-04-14 DIAGNOSIS — R519 Headache, unspecified: Secondary | ICD-10-CM | POA: Diagnosis not present

## 2023-04-14 DIAGNOSIS — H5340 Unspecified visual field defects: Secondary | ICD-10-CM

## 2023-04-14 DIAGNOSIS — H538 Other visual disturbances: Secondary | ICD-10-CM | POA: Diagnosis not present

## 2023-04-14 MED ORDER — GADOPICLENOL 0.5 MMOL/ML IV SOLN
9.0000 mL | Freq: Once | INTRAVENOUS | Status: AC | PRN
  Administered 2023-04-14: 9 mL via INTRAVENOUS

## 2023-06-12 ENCOUNTER — Other Ambulatory Visit: Payer: Medicare Other

## 2023-06-16 ENCOUNTER — Other Ambulatory Visit: Payer: Self-pay

## 2023-06-16 DIAGNOSIS — D509 Iron deficiency anemia, unspecified: Secondary | ICD-10-CM

## 2023-06-19 ENCOUNTER — Inpatient Hospital Stay: Payer: Medicare Other | Admitting: Hematology

## 2023-06-19 ENCOUNTER — Inpatient Hospital Stay: Payer: Medicare Other

## 2023-06-19 DIAGNOSIS — M5416 Radiculopathy, lumbar region: Secondary | ICD-10-CM | POA: Diagnosis not present

## 2023-06-19 DIAGNOSIS — M961 Postlaminectomy syndrome, not elsewhere classified: Secondary | ICD-10-CM | POA: Diagnosis not present

## 2023-06-19 DIAGNOSIS — Z5181 Encounter for therapeutic drug level monitoring: Secondary | ICD-10-CM | POA: Diagnosis not present

## 2023-06-19 DIAGNOSIS — Z79899 Other long term (current) drug therapy: Secondary | ICD-10-CM | POA: Diagnosis not present

## 2023-06-19 DIAGNOSIS — M5412 Radiculopathy, cervical region: Secondary | ICD-10-CM | POA: Diagnosis not present

## 2023-06-19 DIAGNOSIS — G5602 Carpal tunnel syndrome, left upper limb: Secondary | ICD-10-CM | POA: Diagnosis not present

## 2023-06-27 ENCOUNTER — Ambulatory Visit
Admission: RE | Admit: 2023-06-27 | Discharge: 2023-06-27 | Disposition: A | Discharge status: Home / Self Care | Payer: Medicare Other | Source: Ambulatory Visit | Attending: Internal Medicine | Admitting: Internal Medicine

## 2023-06-27 DIAGNOSIS — R590 Localized enlarged lymph nodes: Secondary | ICD-10-CM

## 2023-07-03 DIAGNOSIS — I10 Essential (primary) hypertension: Secondary | ICD-10-CM | POA: Diagnosis not present

## 2023-07-03 DIAGNOSIS — M109 Gout, unspecified: Secondary | ICD-10-CM | POA: Diagnosis not present

## 2023-07-03 DIAGNOSIS — R7303 Prediabetes: Secondary | ICD-10-CM | POA: Diagnosis not present

## 2023-07-03 DIAGNOSIS — D508 Other iron deficiency anemias: Secondary | ICD-10-CM | POA: Diagnosis not present

## 2023-07-03 DIAGNOSIS — I495 Sick sinus syndrome: Secondary | ICD-10-CM | POA: Diagnosis not present

## 2023-07-03 DIAGNOSIS — K219 Gastro-esophageal reflux disease without esophagitis: Secondary | ICD-10-CM | POA: Diagnosis not present

## 2023-07-03 DIAGNOSIS — I25119 Atherosclerotic heart disease of native coronary artery with unspecified angina pectoris: Secondary | ICD-10-CM | POA: Diagnosis not present

## 2023-07-03 DIAGNOSIS — R1310 Dysphagia, unspecified: Secondary | ICD-10-CM | POA: Diagnosis not present

## 2023-07-03 DIAGNOSIS — E782 Mixed hyperlipidemia: Secondary | ICD-10-CM | POA: Diagnosis not present

## 2023-07-03 DIAGNOSIS — G8929 Other chronic pain: Secondary | ICD-10-CM | POA: Diagnosis not present

## 2023-07-06 NOTE — Progress Notes (Signed)
 HEMATOLOGY/ONCOLOGY CLINIC NOTE  Date of Service: 07/07/2023   Patient Care Team: Georgann Housekeeper, MD as PCP - General (Internal Medicine) Rollene Rotunda, MD as PCP - Cardiology (Cardiology) Jearl Klinefelter (Gastroenterology) Kerin Salen, MD as Consulting Physician (Gastroenterology) Karie Soda, MD as Consulting Physician (General Surgery)  CHIEF COMPLAINTS/PURPOSE OF CONSULTATION:  Delayed follow-up for anemia  HISTORY OF PRESENTING ILLNESS:   [name] is a wonderful 68 y.o. female who has been referred to [place] by Dr. Georgann Housekeeper for evaluation and management of Anemia. The pt reports that she is doing well overall.   The pt reports that she has had "anemia off and on for the last 3-4 years, but last year was the worst ever." She started PO [place] slow release iron replacement for the last month, in addition to potassium replacement. She takes 3 pills of her PO iron per day (total of 135mg  elemental iron). Denies GI intolerance with iron replacement. She denies ice cravings or picca symptoms. She has not had IV iron in the past.  She has taken Omeprazole for 2-3 years and endorses "real bad acid reflux." She had a bleeding ulcer in 2018. She denies ulcers or concerns for GI bleeding recently, and denies blood in the stools or black stools. She notes that she recently had a colonoscopy and endoscopy in the last couple months with [facility] and notes that this was unremarkable. The pt notes that she has had two blood transfusions, one in 2018 with stomach ulcer bleed and second prior to a February 2020 surgery.  The pt had surgery on her left wrist in February 2020 after falling out of her bed and fracturing her bed. She will be having her bed lowered soon. She notes that she also fell a few weeks ago when she lost her balance, and sustained a cut on her forehead requiring stitches. She notes that she has poor balance and endorses peripheral neuropathy. She  also endorses fibro myalgia.   The pt notes that she has migraines and uses Excedrin a few times each month.   She notes that she had less fatigue after her blood transfusions, but has recently been feeling more tired. She denies dietary restrictions. She notes that she has a hard time swallowing occasionally, and has had her esophagus dilated once before. She notes that she doesn't have much of an appetite. She denies unexpected weight loss. She endorses staying very well hydrated with frequent water consumption.  Most recent lab results (06/27/18) of CBC is as follows: all values are WNL except for HGB at 9.6, HCT at 31.1, MCV at 67.9, MCH at 21.0, MCHC at 30.9, RDW at 25.3.  On review of systems, pt reports feeling tired, low appetite, acid reflux, poor balance, and denies picca symptoms, unexpected weight loss, noticing new lumps or bumps, new pain along the spine, abdominal pains, leg swelling, and any other symptoms.   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 On Social Hx the pt denies alcohol consumption or ever smoking cigarettes. On Family Hx the pt denies blood disorders  INTERVAL HISTORY:  [name] is a 68 y.o. female here for follow-up for continued evaluation and management of her anemia.  Patient was last seen by me on 12/29/2022 and complained of food getting stuck in the throat and only being able to consume liquids, 30-pounds weight loss in four months, and "drowning" in liquid when drinking liquids too quickly.   Today, she is  accompanied by her husband. Patinet complains of a "rough" six months overall. She reports continued swallowing issues related to esophageal achalasia and reports that she has not been able to consume solid food in over one year. She maintains her nutrition by drinking liquids such as Ensures and smoothies.   She reports issues with both initiating the swallowing as well as food getting stuck when swallowing without moving  down.   Patient had an esophageal dilation in September 2024 to treat her esophageal achalasia. However, her swallowing issues have not improved. Her surgeon is Dr. Michaell Cowing and her next Esophageal dilation is planned for sometime this year.   She notes that her father similarly had esophageal dilation procedures 3-4x which never improved his symptoms.   She reports that her endoscopy in September showed normal findings.  Patient reports that she is occasionally able to consume soft food such as ramen noodles. Patient notes that she needs to crush her medicine to be able to take them.   She denies any obvious black stools, blood in the stools, or other obvious bleeding issues.   Patient is not taking any oral iron, multivitamins or other supplemental vitamins. She reports that she has not tolerated oral iron in the past. She is not taking vitamin D at this time.   She complains of heart burn. She reports that she continues to take acid suppressants. Patient has been taking Dexlansoprazole since hernia repair surgery in April 2024. She is not taking Omeprazole.  Patient takes Baclofen 2-3 times a day. She does not take Zanaflex.   She continues to take Gabapentin for neuropathic symptoms.   Patient complains of feeling "totally" fatigues with constant lack of energy.   She complains of upper abdominal pain. Patient denies any leg swelling.   MEDICAL HISTORY:  Past Medical History:  Diagnosis Date   Anemia    Anxiety    Arthritis    "all over" (11/16/2016)   CAD (coronary artery disease)    Chronic lower back pain    Chronic pain syndrome    "all over" (11/16/2016)   CKD (chronic kidney disease), stage III (HCC)    Depression    Fibromyalgia    GERD (gastroesophageal reflux disease)    History of blood transfusion    "related to hand laceration"   History of hiatal hernia    History of stomach ulcers    "bled when I was younger" (11/16/2016)   Hypercholesterolemia    Hypertension     hx; "they took me off my RX" (11/16/2016)   Migraine 1970s   Phlebitis of leg, right    "years ago" (11/16/2016)   Radiculopathy     SURGICAL HISTORY: Past Surgical History:  Procedure Laterality Date   ABDOMINAL HYSTERECTOMY  1993   w/left ovary   ANKLE FRACTURE SURGERY Bilateral    APPENDECTOMY  1977   BACK SURGERY     BALLOON DILATION  01/20/2023   Procedure: BALLOON DILATION;  Surgeon: Karie Soda, MD;  Location: [facility];  Service: General;;   CARDIAC CATHETERIZATION  2005   "negative"   CHOLECYSTECTOMY OPEN  1977   ESOPHAGOGASTRODUODENOSCOPY  2008   dx'd GERD   ESOPHAGOGASTRODUODENOSCOPY (EGD) WITH PROPOFOL  01/20/2023   Procedure: ESOPHAGOGASTRODUODENOSCOPY (EGD) WITH PROPOFOL;  Surgeon: Karie Soda, MD;  Location: [facility];  Service: General;;   FOREARM FRACTURE SURGERY Right    HIP SURGERY Bilateral    "took out bone spurs"   INSERTION OF MESH N/A 08/09/2022   Procedure: INSERTION  OF MESH;  Surgeon: Karie Soda, MD;  Location: [facility];  Service: General;  Laterality: N/A;   JOINT REPLACEMENT     KNEE ARTHROSCOPY Bilateral    "total of 18 times"   LACERATION REPAIR Right    hand; "went thru glass door"   LEFT HEART CATH AND CORONARY ANGIOGRAPHY N/A 11/18/2016   Procedure: Left Heart Cath and Coronary Angiography;  Surgeon: Yvonne Kendall, MD;  Location: [facility];  Service: Cardiovascular;  Laterality: N/A;   LEFT HEART CATH AND CORONARY ANGIOGRAPHY N/A 03/16/2020   Procedure: LEFT HEART CATH AND CORONARY ANGIOGRAPHY;  Surgeon: Yvonne Kendall, MD;  Location: [facility];  Service: Cardiovascular;  Laterality: N/A;   LUMBAR FUSION     "put rods in"   LUMBAR LAMINECTOMY/DECOMPRESSION MICRODISCECTOMY  05/2006   OPEN REDUCTION INTERNAL FIXATION (ORIF) DISTAL RADIAL FRACTURE Left 06/18/2018   Procedure: OPEN REDUCTION INTERNAL FIXATION (ORIF) DISTAL RADIAL FRACTURE repair and/or reconstruction;  Surgeon: Bradly Bienenstock, MD;  Location: [facility];   Service: Orthopedics;  Laterality: Left;   REVISION TOTAL KNEE ARTHROPLASTY Bilateral    RIGHT OOPHORECTOMY Right    SHOULDER OPEN ROTATOR CUFF REPAIR Bilateral    left 04/2006   SUBMUCOSAL INJECTION  01/20/2023   Procedure: SUBMUCOSAL INJECTION;  Surgeon: Karie Soda, MD;  Location: [facility];  Service: General;;   TONSILLECTOMY     TOTAL KNEE ARTHROPLASTY Bilateral    left 1998; right 2004   TUBAL LIGATION     XI ROBOTIC ASSISTED HIATAL HERNIA REPAIR N/A 08/09/2022   Procedure: XI ROBOTIC ASSISTED HIATAL HERNIA REPAIR WITH PARTIAL TOUPET FUNDOPLICATION;  Surgeon: Karie Soda, MD;  Location: [facility];  Service: General;  Laterality: N/A;    SOCIAL HISTORY: Social History   Socioeconomic History   Marital status: Single    Spouse name: Not on file   Number of children: 0   Years of education: college   Highest education level: Not on file  Occupational History    Comment: retired  Tobacco Use   Smoking status: Never   Smokeless tobacco: Never  Vaping Use   Vaping status: Never Used  Substance and Sexual Activity   Alcohol use: Yes    Comment: occas   Drug use: No   Sexual activity: Yes  Other Topics Concern   Not on file  Social History Narrative   Lives with Myrla Halsted.     Education: college.     Children none.     Retired and Disabled.     Caffeine Drinks tea.   Social Drivers of Corporate investment banker Strain: Not on file  Food Insecurity: No Food Insecurity (08/03/2022)   Hunger Vital Sign    Worried About Running Out of Food in the Last Year: Never true    Ran Out of Food in the Last Year: Never true  Transportation Needs: No Transportation Needs (08/03/2022)   PRAPARE - Administrator, Civil Service (Medical): No    Lack of Transportation (Non-Medical): No  Physical Activity: Not on file  Stress: Not on file  Social Connections: Not on file  Intimate Partner Violence: Not At Risk (08/04/2022)   Humiliation, Afraid, Rape, and Kick  questionnaire    Fear of Current or Ex-Partner: No    Emotionally Abused: No    Physically Abused: No    Sexually Abused: No    FAMILY HISTORY: Family History  Problem Relation Age of Onset   COPD Mother    Emphysema  Mother    CAD Father 72   Lung cancer Father    Heart disease Father    Other Sister        TRAP syndrome   Diabetes Sister        boarderline   Iron deficiency Sister     ALLERGIES:  is allergic to asa [aspirin], ditropan [oxybutynin], and shellfish allergy.  MEDICATIONS:  Current Outpatient Medications  Medication Sig Dispense Refill   acetaminophen (TYLENOL) 500 MG tablet Take 1 tablet (500 mg total) by mouth every 6 (six) hours as needed for mild pain or fever. 30 tablet 0   Acetaminophen-Caffeine (EXCEDRIN ASPIRIN FREE PO) Take 1-2 tablets by mouth daily as needed (pain, headache).     albuterol (VENTOLIN HFA) 108 (90 Base) MCG/ACT inhaler Inhale 2 puffs into the lungs every 4 (four) hours as needed for wheezing or shortness of breath.     ALPRAZolam (XANAX) 1 MG tablet Take 1 mg by mouth 3 (three) times daily as needed for anxiety.     baclofen (LIORESAL) 10 MG tablet Take 10 mg by mouth 3 (three) times daily as needed for muscle spasms.     Dexlansoprazole 30 MG capsule DR Take 30 mg by mouth daily.     DULoxetine (CYMBALTA) 20 MG capsule Take 2 capsules (40 mg total) by mouth 2 (two) times daily. 30 capsule 0   gabapentin (NEURONTIN) 400 MG capsule Take 1 capsule (400 mg total) by mouth 3 (three) times daily. 90 capsule 0   HYDROcodone-acetaminophen (NORCO) 10-325 MG tablet Take 1 tablet by mouth every 4 (four) hours as needed for moderate pain or severe pain. 30 tablet 0   lisinopril (ZESTRIL) 5 MG tablet Take 5 mg by mouth at bedtime.     meloxicam (MOBIC) 15 MG tablet Take 15 mg by mouth daily.      metoCLOPramide (REGLAN) 10 MG tablet Take 1 tablet (10 mg total) by mouth every 6 (six) hours as needed for nausea (nausea / vomiting). 10 tablet 0    nitroGLYCERIN (NITROSTAT) 0.4 MG SL tablet Place 1 tablet (0.4 mg total) under the tongue every 5 (five) minutes x 3 doses as needed for chest pain. 25 tablet 2   ondansetron (ZOFRAN) 8 MG tablet Take 8 mg by mouth 3 (three) times daily as needed for nausea or vomiting.     rosuvastatin (CRESTOR) 20 MG tablet TAKE 1 TABLET BY MOUTH ONCE DAILY . APPOINTMENT REQUIRED FOR FUTURE REFILLS 30 tablet 0   tamsulosin (FLOMAX) 0.4 MG CAPS capsule Take 0.4 mg by mouth daily.     tiZANidine (ZANAFLEX) 4 MG tablet Take 1 tablet (4 mg total) by mouth 2 (two) times daily as needed for muscle spasms.     No current facility-administered medications for this visit.    REVIEW OF SYSTEMS:    10 Point review of Systems was done is negative except as noted above.   PHYSICAL EXAMINATION:  .There were no vitals taken for this visit.   GENERAL:alert, in no acute distress and comfortable SKIN: no acute rashes, no significant lesions EYES: conjunctiva are pink and non-injected, sclera anicteric OROPHARYNX: MMM, no exudates, no oropharyngeal erythema or ulceration NECK: supple, no JVD LYMPH:  no palpable lymphadenopathy in the cervical, axillary or inguinal regions LUNGS: clear to auscultation b/l with normal respiratory effort HEART: regular rate & rhythm ABDOMEN:  normoactive bowel sounds , non tender, not distended. Extremity: no pedal edema PSYCH: alert & oriented x 3 with fluent speech NEURO: no  focal motor/sensory deficits   LABORATORY DATA:  I have reviewed the data as listed  .    Latest Ref Rng & Units 12/29/2022    1:33 PM 10/16/2022    3:58 PM 10/16/2022    3:57 PM  CBC  WBC 4.0 - 10.5 K/uL 4.7   6.6   Hemoglobin 12.0 - 15.0 g/dL 84.6  96.2  95.2   Hematocrit 36.0 - 46.0 % 38.3  44.0  44.1   Platelets 150 - 400 K/uL 166   194     .    Latest Ref Rng & Units 12/29/2022    1:33 PM 10/16/2022    3:58 PM 10/16/2022    3:57 PM  CMP  Glucose 70 - 99 mg/dL 841  324  401   BUN 8 - 23 mg/dL 12   19  17    Creatinine 0.44 - 1.00 mg/dL 0.27  2.53  6.64   Sodium 135 - 145 mmol/L 143  134  133   Potassium 3.5 - 5.1 mmol/L 4.5  4.1  4.0   Chloride 98 - 111 mmol/L 107  102  97   CO2 22 - 32 mmol/L 30   22   Calcium 8.9 - 10.3 mg/dL 9.6   9.3   Total Protein 6.5 - 8.1 g/dL 7.3   7.9   Total Bilirubin 0.3 - 1.2 mg/dL 0.3   0.8   Alkaline Phos 38 - 126 U/L 149   131   AST 15 - 41 U/L 25   26   ALT 0 - 44 U/L 31   36    Iron/TIBC/Ferritin/ %Sat    Component Value Date/Time   IRON 55 12/29/2022 1333   TIBC 428 12/29/2022 1333   FERRITIN 26 12/29/2022 1333   IRONPCTSAT 13 12/29/2022 1333    06/27/18 CBC:   Component     Latest Ref Rng & Units 11/06/2018  Retic Ct Pct     0.4 - 3.1 % 3.1  RBC.     3.87 - 5.11 MIL/uL 4.40  Retic Count, Absolute     19.0 - 186.0 K/uL 138.2  Immature Retic Fract     2.3 - 15.9 % 23.1 (H)  Folate, Hemolysate     Not Estab. ng/mL 371.0  HCT     34.0 - 46.6 % 32.3 (L)  Folate, RBC     >498 ng/mL 1,149  Vitamin B12     180 - 914 pg/mL 458  Sed Rate     0 - 22 mm/hr 19   RADIOGRAPHIC STUDIES: I have personally reviewed the radiological images as listed and agreed with the findings in the report. [place] AXILLA LEFT Result Date: 06/27/2023 CLINICAL DATA:  Three-month interval follow-up of likely benign reactive lymph nodes in the LEFT axilla. Biopsy of a lymph node with focal cortical thickening up to 0.4 cm was performed on 03/09/2023, but lymphoid tissue was not identified pathology. EXAM: ULTRASOUND OF THE LEFT AXILLA COMPARISON:  03/09/2023, 03/07/2023. FINDINGS: Ultrasound is performed, showing that the previously biopsied lymph node with focal cortical thickening up to 0.4 cm is unchanged; the LEFT node maintains normal morphology with a normal fatty hilus. No new or enlarging lymph nodes are identified. IMPRESSION: Stable LEFT axillary lymph node with focal cortical thickening up to 0.4 cm, felt to represent a benign reactive lymph node.  RECOMMENDATION: Annual BILATERAL screening mammography which is due in late October, 2025. I have discussed the findings and recommendations with the patient. If applicable,  a reminder letter will be sent to the patient regarding the next appointment. BI-RADS CATEGORY  2: Benign. Electronically Signed   By: Hulan Saas M.D.   On: 06/27/2023 12:53    ASSESSMENT & PLAN:   68 y.o. female with  1.  Severe microcytic anemia due to iron deficiency 2.  Iron deficiency likely due to chronic GI losses and decreased iron absorption due to chronic PPI use  PLAN:  -Discussed lab results on 07/07/2023 in detail with patient. CBC stable; showed WBC of 3.4K, hemoglobin of 12.4, and platelets of 163K. -WBCs are low-normal which is not concerning -CMP is stable with no concern for dehydration -iron saturation is mildly low and fairly stable -she is slightly iron deficient without anemia -ferritin and B12 labs are pending -discussed ferritin goal of at least 50 closer to 100  -educated patient that there are multiple factors that could be playing into her fatigue, including Baclofen, gabapentin, or deficiencies in iron, B12, or vitamin D -generally would recommend vitamin B complex supplementation especially due to acid suppression causing deficiency of nutrients that are not abdorbed with acid is missing in the stomach -recommend at least 2000 units of liquid vitamin D daily -there may be a role for IV iron to replace iron to improve her energy levels based on labs -discussed that there may be role for additional B12 replacement based on labs -answered all of patient's questions in detail -patient shall return to clinic in 6 months or potentially longer based on findings  FOLLOW-UP: ***  The total time spent in the appointment was *** minutes* .  All of the patient's questions were answered with apparent satisfaction. The patient knows to call the clinic with any problems, questions or  concerns.   Wyvonnia Lora MD MS AAHIVMS [facility] Hematology/Oncology Physician [facility]  .*Total Encounter Time as defined by the Centers for Medicare and Medicaid Services includes, in addition to the face-to-face time of a patient visit (documented in the note above) non-face-to-face time: obtaining and reviewing outside history, ordering and reviewing medications, tests or procedures, care coordination (communications with other health care professionals or caregivers) and documentation in the medical record.    I,Mitra Faeizi,acting as a Neurosurgeon for Wyvonnia Lora, MD.,have documented all relevant documentation on the behalf of Wyvonnia Lora, MD,as directed by  Wyvonnia Lora, MD while in the presence of Wyvonnia Lora, MD.  ***

## 2023-07-07 ENCOUNTER — Inpatient Hospital Stay: Payer: Medicare Other

## 2023-07-07 ENCOUNTER — Inpatient Hospital Stay: Payer: Medicare Other | Attending: Hematology | Admitting: Hematology

## 2023-07-07 VITALS — BP 149/78 | HR 51 | Temp 97.3°F | Resp 15 | Ht 63.0 in | Wt 194.0 lb

## 2023-07-07 DIAGNOSIS — D509 Iron deficiency anemia, unspecified: Secondary | ICD-10-CM

## 2023-07-07 DIAGNOSIS — Z79899 Other long term (current) drug therapy: Secondary | ICD-10-CM | POA: Insufficient documentation

## 2023-07-07 DIAGNOSIS — E611 Iron deficiency: Secondary | ICD-10-CM | POA: Insufficient documentation

## 2023-07-07 LAB — IRON AND IRON BINDING CAPACITY (CC-WL,HP ONLY)
Iron: 57 ug/dL (ref 28–170)
Saturation Ratios: 13 % (ref 10.4–31.8)
TIBC: 440 ug/dL (ref 250–450)
UIBC: 383 ug/dL (ref 148–442)

## 2023-07-07 LAB — CMP (CANCER CENTER ONLY)
ALT: 24 U/L (ref 0–44)
AST: 26 U/L (ref 15–41)
Albumin: 4.3 g/dL (ref 3.5–5.0)
Alkaline Phosphatase: 147 U/L — ABNORMAL HIGH (ref 38–126)
Anion gap: 4 — ABNORMAL LOW (ref 5–15)
BUN: 18 mg/dL (ref 8–23)
CO2: 32 mmol/L (ref 22–32)
Calcium: 9.4 mg/dL (ref 8.9–10.3)
Chloride: 106 mmol/L (ref 98–111)
Creatinine: 0.89 mg/dL (ref 0.44–1.00)
GFR, Estimated: >60 mL/min (ref >60)
Glucose, Bld: 97 mg/dL (ref 70–99)
Potassium: 4.3 mmol/L (ref 3.5–5.1)
Sodium: 142 mmol/L (ref 135–145)
Total Bilirubin: 0.3 mg/dL (ref 0.0–1.2)
Total Protein: 7.1 g/dL (ref 6.5–8.1)

## 2023-07-07 LAB — CBC WITH DIFFERENTIAL (CANCER CENTER ONLY)
Abs Immature Granulocytes: 0.01 10*3/uL (ref 0.00–0.07)
Basophils Absolute: 0.1 10*3/uL (ref 0.0–0.1)
Basophils Relative: 2 %
Eosinophils Absolute: 0.2 10*3/uL (ref 0.0–0.5)
Eosinophils Relative: 4 %
HCT: 39 % (ref 36.0–46.0)
Hemoglobin: 12.4 g/dL (ref 12.0–15.0)
Immature Granulocytes: 0 %
Lymphocytes Relative: 31 %
Lymphs Abs: 1.1 10*3/uL (ref 0.7–4.0)
MCH: 27 pg (ref 26.0–34.0)
MCHC: 31.8 g/dL (ref 30.0–36.0)
MCV: 85 fL (ref 80.0–100.0)
Monocytes Absolute: 0.3 10*3/uL (ref 0.1–1.0)
Monocytes Relative: 9 %
Neutro Abs: 1.8 10*3/uL (ref 1.7–7.7)
Neutrophils Relative %: 54 %
Platelet Count: 163 10*3/uL (ref 150–400)
RBC: 4.59 MIL/uL (ref 3.87–5.11)
RDW: 13.6 % (ref 11.5–15.5)
WBC Count: 3.4 10*3/uL — ABNORMAL LOW (ref 4.0–10.5)
nRBC: 0 % (ref 0.0–0.2)

## 2023-07-07 LAB — FERRITIN: Ferritin: 23 ng/mL (ref 11–307)

## 2023-07-07 LAB — VITAMIN B12: Vitamin B-12: 359 pg/mL (ref 180–914)

## 2023-07-11 DIAGNOSIS — L578 Other skin changes due to chronic exposure to nonionizing radiation: Secondary | ICD-10-CM | POA: Diagnosis not present

## 2023-07-11 DIAGNOSIS — L821 Other seborrheic keratosis: Secondary | ICD-10-CM | POA: Diagnosis not present

## 2023-07-11 DIAGNOSIS — L920 Granuloma annulare: Secondary | ICD-10-CM | POA: Diagnosis not present

## 2023-07-13 ENCOUNTER — Encounter: Payer: Self-pay | Admitting: Hematology

## 2023-07-13 DIAGNOSIS — M5416 Radiculopathy, lumbar region: Secondary | ICD-10-CM | POA: Diagnosis not present

## 2023-07-25 NOTE — Progress Notes (Signed)
 Contacted pt per Dr Candise Che to let patient know she is iron deficient and we shall recommend 2 doses of IV feraheme to optimize her iron levels   Pt acknowledged information and verbalized understanding.

## 2023-07-28 ENCOUNTER — Other Ambulatory Visit: Payer: Self-pay | Admitting: Physician Assistant

## 2023-07-31 ENCOUNTER — Telehealth: Payer: Self-pay

## 2023-07-31 NOTE — Telephone Encounter (Signed)
 Dr. Candise Che, patient will be scheduled as soon as possible.  Auth Submission: NO AUTH NEEDED Site of care: Site of care: [facility] Payer: [employer] medicare Medication & CPT/J Code(s) submitted: Feraheme (ferumoxytol) [id number] Route of submission (phone, fax, portal): portal Phone # Fax # Auth type: Buy/Bill PB Units/visits requested: 510mg  x 2 doses Reference number: [id number] Approval from: 07/31/23 to 01/31/24

## 2023-08-08 ENCOUNTER — Ambulatory Visit (INDEPENDENT_AMBULATORY_CARE_PROVIDER_SITE_OTHER)

## 2023-08-08 VITALS — BP 145/82 | HR 46 | Temp 98.0°F | Resp 16 | Ht 63.0 in | Wt 195.0 lb

## 2023-08-08 DIAGNOSIS — D508 Other iron deficiency anemias: Secondary | ICD-10-CM

## 2023-08-08 DIAGNOSIS — D509 Iron deficiency anemia, unspecified: Secondary | ICD-10-CM

## 2023-08-08 MED ORDER — ACETAMINOPHEN 325 MG PO TABS
650.0000 mg | ORAL_TABLET | Freq: Once | ORAL | Status: AC
  Administered 2023-08-08: 650 mg via ORAL
  Filled 2023-08-08: qty 2

## 2023-08-08 MED ORDER — DIPHENHYDRAMINE HCL 25 MG PO CAPS
25.0000 mg | ORAL_CAPSULE | Freq: Once | ORAL | Status: AC
  Administered 2023-08-08: 25 mg via ORAL
  Filled 2023-08-08: qty 1

## 2023-08-08 MED ORDER — FERUMOXYTOL INJECTION 510 MG/17 ML
510.0000 mg | Freq: Once | INTRAVENOUS | Status: AC
  Administered 2023-08-08: 510 mg via INTRAVENOUS
  Filled 2023-08-08: qty 17

## 2023-08-08 NOTE — Progress Notes (Signed)
 Diagnosis: Acute Anemia  Provider:  Chilton Greathouse MD  Procedure: IV Infusion  IV Type: Peripheral, IV Location: R Forearm  Feraheme (Ferumoxytol), Dose: 510 mg  Infusion Start Time: 1426  Infusion Stop Time: 1446  Post Infusion IV Care: Observation period completed and Peripheral IV Discontinued  Discharge: Condition: Good, Destination: Home . AVS Declined  Performed by:  Nat Math, RN

## 2023-08-15 ENCOUNTER — Ambulatory Visit

## 2023-08-15 VITALS — BP 135/78 | HR 49 | Temp 97.5°F | Resp 20 | Ht 63.0 in | Wt 192.2 lb

## 2023-08-15 DIAGNOSIS — D509 Iron deficiency anemia, unspecified: Secondary | ICD-10-CM | POA: Diagnosis not present

## 2023-08-15 DIAGNOSIS — D508 Other iron deficiency anemias: Secondary | ICD-10-CM

## 2023-08-15 MED ORDER — SODIUM CHLORIDE 0.9 % IV SOLN
510.0000 mg | Freq: Once | INTRAVENOUS | Status: AC
  Administered 2023-08-15: 510 mg via INTRAVENOUS
  Filled 2023-08-15: qty 17

## 2023-08-15 MED ORDER — ACETAMINOPHEN 325 MG PO TABS
650.0000 mg | ORAL_TABLET | Freq: Once | ORAL | Status: AC
  Administered 2023-08-15: 650 mg via ORAL
  Filled 2023-08-15: qty 2

## 2023-08-15 MED ORDER — DIPHENHYDRAMINE HCL 25 MG PO CAPS
25.0000 mg | ORAL_CAPSULE | Freq: Once | ORAL | Status: AC
  Administered 2023-08-15: 25 mg via ORAL
  Filled 2023-08-15: qty 1

## 2023-08-15 NOTE — Progress Notes (Signed)
 Diagnosis: Iron Deficiency Anemia  Provider:  Chilton Greathouse MD  Procedure: IV Infusion  IV Type: Peripheral, IV Location: R Antecubital  Feraheme (Ferumoxytol), Dose: 510 mg  Infusion Start Time: 1412  Infusion Stop Time: 1433  Post Infusion IV Care: Observation period completed and Peripheral IV Discontinued  Discharge: Condition: Good, Destination: Home . AVS Provided  Performed by:  Nat Math, RN

## 2023-08-28 DIAGNOSIS — H52203 Unspecified astigmatism, bilateral: Secondary | ICD-10-CM | POA: Diagnosis not present

## 2023-08-28 DIAGNOSIS — H524 Presbyopia: Secondary | ICD-10-CM | POA: Diagnosis not present

## 2023-08-28 DIAGNOSIS — H04123 Dry eye syndrome of bilateral lacrimal glands: Secondary | ICD-10-CM | POA: Diagnosis not present

## 2023-08-28 DIAGNOSIS — H538 Other visual disturbances: Secondary | ICD-10-CM | POA: Diagnosis not present

## 2023-08-28 DIAGNOSIS — Z961 Presence of intraocular lens: Secondary | ICD-10-CM | POA: Diagnosis not present

## 2023-08-28 DIAGNOSIS — H532 Diplopia: Secondary | ICD-10-CM | POA: Diagnosis not present

## 2023-08-28 DIAGNOSIS — H53413 Scotoma involving central area, bilateral: Secondary | ICD-10-CM | POA: Diagnosis not present

## 2023-10-16 DIAGNOSIS — M5459 Other low back pain: Secondary | ICD-10-CM | POA: Diagnosis not present

## 2023-10-16 DIAGNOSIS — M7918 Myalgia, other site: Secondary | ICD-10-CM | POA: Diagnosis not present

## 2023-10-16 DIAGNOSIS — M5412 Radiculopathy, cervical region: Secondary | ICD-10-CM | POA: Diagnosis not present

## 2023-10-16 DIAGNOSIS — M5416 Radiculopathy, lumbar region: Secondary | ICD-10-CM | POA: Diagnosis not present

## 2023-10-16 DIAGNOSIS — M961 Postlaminectomy syndrome, not elsewhere classified: Secondary | ICD-10-CM | POA: Diagnosis not present

## 2023-10-24 DIAGNOSIS — M5416 Radiculopathy, lumbar region: Secondary | ICD-10-CM | POA: Diagnosis not present

## 2023-11-06 DIAGNOSIS — E782 Mixed hyperlipidemia: Secondary | ICD-10-CM | POA: Diagnosis not present

## 2023-11-06 DIAGNOSIS — N1831 Chronic kidney disease, stage 3a: Secondary | ICD-10-CM | POA: Diagnosis not present

## 2023-11-06 DIAGNOSIS — I25119 Atherosclerotic heart disease of native coronary artery with unspecified angina pectoris: Secondary | ICD-10-CM | POA: Diagnosis not present

## 2023-11-13 ENCOUNTER — Other Ambulatory Visit (HOSPITAL_COMMUNITY): Payer: Self-pay | Admitting: Surgery

## 2023-11-13 DIAGNOSIS — Z8719 Personal history of other diseases of the digestive system: Secondary | ICD-10-CM

## 2023-11-13 DIAGNOSIS — R110 Nausea: Secondary | ICD-10-CM

## 2023-11-13 DIAGNOSIS — R1319 Other dysphagia: Secondary | ICD-10-CM

## 2023-11-24 ENCOUNTER — Other Ambulatory Visit (HOSPITAL_COMMUNITY)

## 2023-11-24 ENCOUNTER — Encounter (HOSPITAL_COMMUNITY)
Admission: RE | Admit: 2023-11-24 | Discharge: 2023-11-24 | Disposition: A | Discharge status: Home / Self Care | Source: Ambulatory Visit | Attending: Surgery | Admitting: Surgery

## 2023-11-24 ENCOUNTER — Inpatient Hospital Stay (HOSPITAL_COMMUNITY): Admission: RE | Admit: 2023-11-24 | Source: Ambulatory Visit

## 2023-11-24 DIAGNOSIS — R1319 Other dysphagia: Secondary | ICD-10-CM | POA: Insufficient documentation

## 2023-11-24 DIAGNOSIS — R110 Nausea: Secondary | ICD-10-CM | POA: Diagnosis not present

## 2023-11-24 DIAGNOSIS — K30 Functional dyspepsia: Secondary | ICD-10-CM | POA: Diagnosis not present

## 2023-11-24 DIAGNOSIS — R1310 Dysphagia, unspecified: Secondary | ICD-10-CM | POA: Diagnosis not present

## 2023-11-24 DIAGNOSIS — R109 Unspecified abdominal pain: Secondary | ICD-10-CM | POA: Diagnosis not present

## 2023-11-24 DIAGNOSIS — Z8719 Personal history of other diseases of the digestive system: Secondary | ICD-10-CM | POA: Insufficient documentation

## 2023-11-24 MED ORDER — TECHNETIUM TC 99M SULFUR COLLOID
2.0000 | Freq: Once | INTRAVENOUS | Status: AC
  Administered 2023-11-24: 2.17 via INTRAVENOUS

## 2023-11-27 ENCOUNTER — Ambulatory Visit (HOSPITAL_COMMUNITY)
Admission: RE | Admit: 2023-11-27 | Discharge: 2023-11-27 | Disposition: A | Discharge status: Home / Self Care | Source: Ambulatory Visit | Attending: Surgery | Admitting: Surgery

## 2023-11-27 ENCOUNTER — Ambulatory Visit: Payer: Self-pay | Admitting: Surgery

## 2023-11-27 DIAGNOSIS — R1319 Other dysphagia: Secondary | ICD-10-CM | POA: Diagnosis not present

## 2023-11-27 DIAGNOSIS — K219 Gastro-esophageal reflux disease without esophagitis: Secondary | ICD-10-CM | POA: Diagnosis not present

## 2023-11-27 DIAGNOSIS — K449 Diaphragmatic hernia without obstruction or gangrene: Secondary | ICD-10-CM | POA: Diagnosis not present

## 2023-11-27 DIAGNOSIS — R110 Nausea: Secondary | ICD-10-CM | POA: Diagnosis not present

## 2023-11-27 DIAGNOSIS — Z8719 Personal history of other diseases of the digestive system: Secondary | ICD-10-CM | POA: Diagnosis not present

## 2023-12-05 DIAGNOSIS — R5383 Other fatigue: Secondary | ICD-10-CM | POA: Diagnosis not present

## 2023-12-05 DIAGNOSIS — I10 Essential (primary) hypertension: Secondary | ICD-10-CM | POA: Diagnosis not present

## 2023-12-05 DIAGNOSIS — R079 Chest pain, unspecified: Secondary | ICD-10-CM | POA: Diagnosis not present

## 2023-12-05 DIAGNOSIS — R0609 Other forms of dyspnea: Secondary | ICD-10-CM | POA: Diagnosis not present

## 2023-12-07 DIAGNOSIS — N1831 Chronic kidney disease, stage 3a: Secondary | ICD-10-CM | POA: Diagnosis not present

## 2023-12-07 DIAGNOSIS — I25119 Atherosclerotic heart disease of native coronary artery with unspecified angina pectoris: Secondary | ICD-10-CM | POA: Diagnosis not present

## 2023-12-07 DIAGNOSIS — E782 Mixed hyperlipidemia: Secondary | ICD-10-CM | POA: Diagnosis not present

## 2024-01-07 DIAGNOSIS — N1831 Chronic kidney disease, stage 3a: Secondary | ICD-10-CM | POA: Diagnosis not present

## 2024-01-07 DIAGNOSIS — E782 Mixed hyperlipidemia: Secondary | ICD-10-CM | POA: Diagnosis not present

## 2024-01-07 DIAGNOSIS — I25119 Atherosclerotic heart disease of native coronary artery with unspecified angina pectoris: Secondary | ICD-10-CM | POA: Diagnosis not present

## 2024-01-10 NOTE — Progress Notes (Unsigned)
 Cardiology Office Note:   Date:  01/11/2024  ID:  [name], DOB Sep 08, 1955, MRN [id number] PCP: Ransom Other, MD  [facility] Providers Cardiologist:  None {  History of Present Illness:   [name] is a 68 y.o. female ho presents for evaluation of coronary calcium .    She had been seen by Dr. Claudene several years ago.  She was seen in our office in August by Jackee Alberts NP.  She had underwent LHC by Dr. Claudene in 2005 which showed normal coronaries with normal LVEF.  She underwent nuclear stress test that showed partially fixed defect with mid and apical inferior wall ischemia.  She underwent cardiac CTA that showed calcium  score of 848 with dense calcium  in the mid LAD and RCA.  She had a repeat LHC that showed mild to moderate nonobstructive CAD with recommendation of medical therapy to prevent progression of CAD.  She was placed on ASA therapy due to history of peptic ulcer and was noted to have prolonged QTc of 526 ms in setting of hypokalemia during admission.  QTc was normalized following resolution of hypokalemia with discontinuation of Phenergan and Seroquel.She underwent repeat LHC in 03/2020 for complaint of exertional chest pain and shortness of breath.  LHC showed sowed mild to moderate, nonobstructive CAD similar to prior cardiac catheterization.  At the last visit in 2023 a repeat cath was suggested .  However, she did not have that test done.  The next time we saw her was in 2024 in the hospital when she had hernia surgery.  She has had problems eating and swallowing food over the last year.  Her longstanding fianc says that she has not really been able to eat anything but small bites.  She is seeing her surgeon and is seeing gastroenterology.  I saw her in March 2024 preoperatively.  No cardiac workup was necessary at that time.  She presents for follow-up now.  She has been having 6 months of progressive fatigue.  She has mid to left upper chest discomfort that  goes through to her back.  She has left arm discomfort that can happen with the mid chest discomfort.  It seems to be happening more with exertion though it can come on randomly.  It is 7 out of 10 in intensity.  It is associated with shortness of breath.  She is more frequent.  She did not have nitroglycerin  until recently so she has not taken any of this.  She is not describing PND or orthopnea.  She is not had any new palpitations, presyncope or syncope.   ROS: As stated in the HPI and negative for all other systems.  Studies Reviewed:    EKG:   EKG Interpretation Date/Time:  Thursday January 11 2024 14:14:27 EDT Ventricular Rate:  48 PR Interval:  142 QRS Duration:  78 QT Interval:  480 QTC Calculation: 428 R Axis:   13  Text Interpretation: Sinus bradycardia When compared with ECG of 16-Oct-2022 15:50, rate is slower Confirmed by Lavona Agent ([id number]) on 01/11/2024 2:38:27 PM    Risk Assessment/Calculations:          Physical Exam:   VS:  BP (!) 150/80   Pulse (!) 48   Ht 5' 3 (1.6 m)   Wt 192 lb (87.1 kg)   SpO2 95%   BMI 34.01 kg/m    Wt Readings from Last 3 Encounters:  01/11/24 192 lb (87.1 kg)  08/15/23 192 lb 3.2 oz (87.2 kg)  08/08/23 195 lb (88.5 kg)     GEN: Well nourished, well developed in no acute distress NECK: No JVD; No carotid bruits CARDIAC: RRR, no murmurs, rubs, gallops RESPIRATORY:  Clear to auscultation without rales, wheezing or rhonchi  ABDOMEN: Soft, non-tender, non-distended EXTREMITIES:  No edema; No deformity   ASSESSMENT AND PLAN:   Nonobstructive CAD:   However, she has continued chest discomfort.  She has progressive shortness of breath.  I would like her to have right and left heart catheterization.  It was suggested previously that she have coronary flow reserve testing.  The patient understands that risks included but are not limited to stroke (1 in 1000), death (1 in 1000), kidney failure [usually temporary] (1 in 500), bleeding  (1 in 200), allergic reaction [possibly serious] (1 in 200).  The patient understands and agrees to proceed.    Dyspnea on exertion: This will be evaluated as above.  She had resolution of this and should avoid QT prolonging drugs.    History of prolonged QT interval:   Dyslipidemia: LDL was 60 with an HDL 49.  No change in therapy. -Lipids followed by PCP -Continue Crestor  20 mg daily     Follow up with APP in six months or sooner based on the results of the cardiac cath  Signed, Lynwood Schilling, MD

## 2024-01-10 NOTE — H&P (View-Only) (Signed)
 Cardiology Office Note:   Date:  01/11/2024  ID:  [name], DOB 1955-07-17, MRN [id number] PCP: Ransom Other, MD  [facility] Providers Cardiologist:  None {  History of Present Illness:   [name] is a 68 y.o. female ho presents for evaluation of coronary calcium .    She had been seen by Dr. Claudene several years ago.  She was seen in our office in August by Jackee Alberts NP.  She had underwent LHC by Dr. Claudene in 2005 which showed normal coronaries with normal LVEF.  She underwent nuclear stress test that showed partially fixed defect with mid and apical inferior wall ischemia.  She underwent cardiac CTA that showed calcium  score of 848 with dense calcium  in the mid LAD and RCA.  She had a repeat LHC that showed mild to moderate nonobstructive CAD with recommendation of medical therapy to prevent progression of CAD.  She was placed on ASA therapy due to history of peptic ulcer and was noted to have prolonged QTc of 526 ms in setting of hypokalemia during admission.  QTc was normalized following resolution of hypokalemia with discontinuation of Phenergan and Seroquel.She underwent repeat LHC in 03/2020 for complaint of exertional chest pain and shortness of breath.  LHC showed sowed mild to moderate, nonobstructive CAD similar to prior cardiac catheterization.  At the last visit in 2023 a repeat cath was suggested .  However, she did not have that test done.  The next time we saw her was in 2024 in the hospital when she had hernia surgery.  She has had problems eating and swallowing food over the last year.  Her longstanding fianc says that she has not really been able to eat anything but small bites.  She is seeing her surgeon and is seeing gastroenterology.  I saw her in March 2024 preoperatively.  No cardiac workup was necessary at that time.  She presents for follow-up now.  She has been having 6 months of progressive fatigue.  She has mid to left upper chest discomfort that  goes through to her back.  She has left arm discomfort that can happen with the mid chest discomfort.  It seems to be happening more with exertion though it can come on randomly.  It is 7 out of 10 in intensity.  It is associated with shortness of breath.  She is more frequent.  She did not have nitroglycerin  until recently so she has not taken any of this.  She is not describing PND or orthopnea.  She is not had any new palpitations, presyncope or syncope.   ROS: As stated in the HPI and negative for all other systems.  Studies Reviewed:    EKG:   EKG Interpretation Date/Time:  Thursday January 11 2024 14:14:27 EDT Ventricular Rate:  48 PR Interval:  142 QRS Duration:  78 QT Interval:  480 QTC Calculation: 428 R Axis:   13  Text Interpretation: Sinus bradycardia When compared with ECG of 16-Oct-2022 15:50, rate is slower Confirmed by Lavona Agent ([id number]) on 01/11/2024 2:38:27 PM    Risk Assessment/Calculations:          Physical Exam:   VS:  BP (!) 150/80   Pulse (!) 48   Ht 5' 3 (1.6 m)   Wt 192 lb (87.1 kg)   SpO2 95%   BMI 34.01 kg/m    Wt Readings from Last 3 Encounters:  01/11/24 192 lb (87.1 kg)  08/15/23 192 lb 3.2 oz (87.2 kg)  08/08/23 195 lb (88.5 kg)     GEN: Well nourished, well developed in no acute distress NECK: No JVD; No carotid bruits CARDIAC: RRR, no murmurs, rubs, gallops RESPIRATORY:  Clear to auscultation without rales, wheezing or rhonchi  ABDOMEN: Soft, non-tender, non-distended EXTREMITIES:  No edema; No deformity   ASSESSMENT AND PLAN:   Nonobstructive CAD:   However, she has continued chest discomfort.  She has progressive shortness of breath.  I would like her to have right and left heart catheterization.  It was suggested previously that she have coronary flow reserve testing.  The patient understands that risks included but are not limited to stroke (1 in 1000), death (1 in 1000), kidney failure [usually temporary] (1 in 500), bleeding  (1 in 200), allergic reaction [possibly serious] (1 in 200).  The patient understands and agrees to proceed.    Dyspnea on exertion: This will be evaluated as above.  She had resolution of this and should avoid QT prolonging drugs.    History of prolonged QT interval:   Dyslipidemia: LDL was 60 with an HDL 49.  No change in therapy. -Lipids followed by PCP -Continue Crestor  20 mg daily     Follow up with APP in six months or sooner based on the results of the cardiac cath  Signed, Lynwood Schilling, MD

## 2024-01-11 ENCOUNTER — Encounter: Payer: Self-pay | Admitting: Cardiology

## 2024-01-11 ENCOUNTER — Ambulatory Visit: Attending: Cardiology | Admitting: Cardiology

## 2024-01-11 VITALS — BP 150/80 | HR 48 | Ht 63.0 in | Wt 192.0 lb

## 2024-01-11 DIAGNOSIS — Z01812 Encounter for preprocedural laboratory examination: Secondary | ICD-10-CM

## 2024-01-11 DIAGNOSIS — R0602 Shortness of breath: Secondary | ICD-10-CM

## 2024-01-11 DIAGNOSIS — E785 Hyperlipidemia, unspecified: Secondary | ICD-10-CM

## 2024-01-11 DIAGNOSIS — I2510 Atherosclerotic heart disease of native coronary artery without angina pectoris: Secondary | ICD-10-CM | POA: Diagnosis not present

## 2024-01-11 LAB — CBC
Hematocrit: 40.7 % (ref 34.0–46.6)
Hemoglobin: 13.4 g/dL (ref 11.1–15.9)
MCH: 29.5 pg (ref 26.6–33.0)
MCHC: 32.9 g/dL (ref 31.5–35.7)
MCV: 90 fL (ref 79–97)
Platelets: 170 x10E3/uL (ref 150–450)
RBC: 4.55 x10E6/uL (ref 3.77–5.28)
RDW: 12.9 % (ref 11.7–15.4)
WBC: 4.6 x10E3/uL (ref 3.4–10.8)

## 2024-01-11 LAB — BASIC METABOLIC PANEL WITH GFR
BUN/Creatinine Ratio: 20 (ref 12–28)
BUN: 17 mg/dL (ref 8–27)
CO2: 23 mmol/L (ref 20–29)
Calcium: 9.5 mg/dL (ref 8.7–10.3)
Chloride: 104 mmol/L (ref 96–106)
Creatinine, Ser: 0.86 mg/dL (ref 0.57–1.00)
Glucose: 79 mg/dL (ref 70–99)
Potassium: 4.1 mmol/L (ref 3.5–5.2)
Sodium: 142 mmol/L (ref 134–144)
eGFR: 73 mL/min/1.73 (ref >59)

## 2024-01-11 MED ORDER — LISINOPRIL 10 MG PO TABS
10.0000 mg | ORAL_TABLET | Freq: Every day | ORAL | 3 refills | Status: AC
Start: 2024-01-11 — End: ?

## 2024-01-11 NOTE — Patient Instructions (Addendum)
 Medication Instructions:  Increase Lisinopril  to 10 mg once daily *If you need a refill on your cardiac medications before your next appointment, please call your pharmacy*  Lab Work: CBC, BMET today If you have labs (blood work) drawn today and your tests are completely normal, you will receive your results only by: MyChart Message (if you have MyChart) OR A paper copy in the mail If you have any lab test that is abnormal or we need to change your treatment, we will call you to review the results.  Testing/Procedures: Right and Left Heart Cath with Coronary Flow Reserve  Follow-Up: At [facility], you and your health needs are our priority.  As part of our continuing mission to provide you with exceptional heart care, our providers are all part of one team.  This team includes your primary Cardiologist (physician) and Advanced Practice Providers or APPs (Physician Assistants and Nurse Practitioners) who all work together to provide you with the care you need, when you need it.  Your next appointment:   6 months  Provider:   Hao Meng, PA-C  We recommend signing up for the patient portal called MyChart.  Sign up information is provided on this After Visit Summary.  MyChart is used to connect with patients for Virtual Visits (Telemedicine).  Patients are able to view lab/test results, encounter notes, upcoming appointments, etc.  Non-urgent messages can be sent to your provider as well.   To learn more about what you can do with MyChart, go to [email or web address].   Other Instructions

## 2024-01-12 ENCOUNTER — Ambulatory Visit: Payer: Self-pay | Admitting: Cardiology

## 2024-01-16 ENCOUNTER — Other Ambulatory Visit: Payer: Self-pay | Admitting: Internal Medicine

## 2024-01-16 DIAGNOSIS — Z1231 Encounter for screening mammogram for malignant neoplasm of breast: Secondary | ICD-10-CM

## 2024-01-17 ENCOUNTER — Inpatient Hospital Stay: Admitting: Hematology

## 2024-01-17 ENCOUNTER — Other Ambulatory Visit

## 2024-01-19 ENCOUNTER — Inpatient Hospital Stay: Admitting: Hematology

## 2024-01-19 ENCOUNTER — Inpatient Hospital Stay

## 2024-01-22 ENCOUNTER — Ambulatory Visit (HOSPITAL_COMMUNITY)
Admission: RE | Admit: 2024-01-22 | Discharge: 2024-01-22 | Disposition: A | Discharge status: Home / Self Care | Attending: Internal Medicine | Admitting: Internal Medicine

## 2024-01-22 ENCOUNTER — Other Ambulatory Visit: Payer: Self-pay

## 2024-01-22 ENCOUNTER — Encounter (HOSPITAL_COMMUNITY)
Admission: RE | Disposition: A | Discharge status: Home / Self Care | Payer: Self-pay | Source: Home / Self Care | Attending: Internal Medicine

## 2024-01-22 ENCOUNTER — Encounter (HOSPITAL_COMMUNITY): Payer: Self-pay | Admitting: Internal Medicine

## 2024-01-22 DIAGNOSIS — Z7982 Long term (current) use of aspirin: Secondary | ICD-10-CM | POA: Insufficient documentation

## 2024-01-22 DIAGNOSIS — Z8711 Personal history of peptic ulcer disease: Secondary | ICD-10-CM | POA: Insufficient documentation

## 2024-01-22 DIAGNOSIS — Z79899 Other long term (current) drug therapy: Secondary | ICD-10-CM | POA: Diagnosis not present

## 2024-01-22 DIAGNOSIS — I25110 Atherosclerotic heart disease of native coronary artery with unstable angina pectoris: Secondary | ICD-10-CM | POA: Diagnosis not present

## 2024-01-22 DIAGNOSIS — E785 Hyperlipidemia, unspecified: Secondary | ICD-10-CM | POA: Insufficient documentation

## 2024-01-22 DIAGNOSIS — I200 Unstable angina: Secondary | ICD-10-CM | POA: Diagnosis present

## 2024-01-22 HISTORY — PX: RIGHT/LEFT HEART CATH AND CORONARY ANGIOGRAPHY: CATH118266

## 2024-01-22 LAB — POCT I-STAT EG7
Acid-base deficit: 1 mmol/L (ref 0.0–2.0)
Bicarbonate: 25.9 mmol/L (ref 20.0–28.0)
Calcium, Ion: 1.29 mmol/L (ref 1.15–1.40)
HCT: 35 % — ABNORMAL LOW (ref 36.0–46.0)
Hemoglobin: 11.9 g/dL — ABNORMAL LOW (ref 12.0–15.0)
O2 Saturation: 62 %
Potassium: 3.9 mmol/L (ref 3.5–5.1)
Sodium: 145 mmol/L (ref 135–145)
TCO2: 27 mmol/L (ref 22–32)
pCO2, Ven: 50.6 mmHg (ref 44–60)
pH, Ven: 7.317 (ref 7.25–7.43)
pO2, Ven: 35 mmHg (ref 32–45)

## 2024-01-22 LAB — POCT I-STAT 7, (LYTES, BLD GAS, ICA,H+H)
Acid-base deficit: 1 mmol/L (ref 0.0–2.0)
Bicarbonate: 23.9 mmol/L (ref 20.0–28.0)
Calcium, Ion: 1.24 mmol/L (ref 1.15–1.40)
HCT: 36 % (ref 36.0–46.0)
Hemoglobin: 12.2 g/dL (ref 12.0–15.0)
O2 Saturation: 93 %
Potassium: 3.8 mmol/L (ref 3.5–5.1)
Sodium: 144 mmol/L (ref 135–145)
TCO2: 25 mmol/L (ref 22–32)
pCO2 arterial: 41.6 mmHg (ref 32–48)
pH, Arterial: 7.367 (ref 7.35–7.45)
pO2, Arterial: 69 mmHg — ABNORMAL LOW (ref 83–108)

## 2024-01-22 SURGERY — RIGHT/LEFT HEART CATH AND CORONARY ANGIOGRAPHY
Anesthesia: LOCAL

## 2024-01-22 MED ORDER — IOHEXOL 350 MG/ML SOLN
INTRAVENOUS | Status: DC | PRN
  Administered 2024-01-22: 55 mL

## 2024-01-22 MED ORDER — SODIUM CHLORIDE 0.9 % IV SOLN: INTRAVENOUS | Status: DC

## 2024-01-22 MED ORDER — ACETAMINOPHEN 325 MG PO TABS: 650.0000 mg | ORAL_TABLET | ORAL | Status: DC | PRN

## 2024-01-22 MED ORDER — HYDRALAZINE HCL 20 MG/ML IJ SOLN: 10.0000 mg | INTRAMUSCULAR | Status: DC | PRN

## 2024-01-22 MED ORDER — MIDAZOLAM HCL 2 MG/2ML IJ SOLN
INTRAMUSCULAR | Status: AC
  Filled 2024-01-22: qty 2

## 2024-01-22 MED ORDER — FENTANYL CITRATE (PF) 100 MCG/2ML IJ SOLN
INTRAMUSCULAR | Status: AC
  Filled 2024-01-22: qty 2

## 2024-01-22 MED ORDER — FENTANYL CITRATE (PF) 100 MCG/2ML IJ SOLN
INTRAMUSCULAR | Status: DC | PRN
  Administered 2024-01-22: 25 ug via INTRAVENOUS

## 2024-01-22 MED ORDER — HEPARIN SODIUM (PORCINE) 1000 UNIT/ML IJ SOLN
INTRAMUSCULAR | Status: AC
  Filled 2024-01-22: qty 10

## 2024-01-22 MED ORDER — ASPIRIN 81 MG PO TBEC: 81.0000 mg | DELAYED_RELEASE_TABLET | Freq: Every day | ORAL | Status: AC

## 2024-01-22 MED ORDER — VERAPAMIL HCL 2.5 MG/ML IV SOLN
INTRAVENOUS | Status: AC
  Filled 2024-01-22: qty 2

## 2024-01-22 MED ORDER — SODIUM CHLORIDE 0.9% FLUSH: 3.0000 mL | INTRAVENOUS | Status: DC | PRN

## 2024-01-22 MED ORDER — MIDAZOLAM HCL 2 MG/2ML IJ SOLN
INTRAMUSCULAR | Status: DC | PRN
  Administered 2024-01-22: 1 mg via INTRAVENOUS

## 2024-01-22 MED ORDER — SODIUM CHLORIDE 0.9 % IV SOLN: 250.0000 mL | INTRAVENOUS | Status: DC | PRN

## 2024-01-22 MED ORDER — ISOSORBIDE MONONITRATE ER 30 MG PO TB24: 30.0000 mg | ORAL_TABLET | Freq: Every day | ORAL | 11 refills | Status: DC

## 2024-01-22 MED ORDER — VERAPAMIL HCL 2.5 MG/ML IV SOLN
INTRAVENOUS | Status: DC | PRN
  Administered 2024-01-22: 10 mL via INTRA_ARTERIAL

## 2024-01-22 MED ORDER — SODIUM CHLORIDE 0.9% FLUSH: 3.0000 mL | Freq: Two times a day (BID) | INTRAVENOUS | Status: DC

## 2024-01-22 MED ORDER — LABETALOL HCL 5 MG/ML IV SOLN: 10.0000 mg | INTRAVENOUS | Status: DC | PRN

## 2024-01-22 MED ORDER — LIDOCAINE HCL (PF) 1 % IJ SOLN
INTRAMUSCULAR | Status: DC | PRN
  Administered 2024-01-22 (×2): 2 mL via INTRADERMAL

## 2024-01-22 MED ORDER — LIDOCAINE HCL (PF) 1 % IJ SOLN
INTRAMUSCULAR | Status: AC
  Filled 2024-01-22: qty 30

## 2024-01-22 MED ORDER — ASPIRIN 81 MG PO CHEW: 81.0000 mg | CHEWABLE_TABLET | ORAL | Status: DC

## 2024-01-22 MED ORDER — HEPARIN (PORCINE) IN NACL 1000-0.9 UT/500ML-% IV SOLN
INTRAVENOUS | Status: DC | PRN
  Administered 2024-01-22 (×2): 500 mL

## 2024-01-22 MED ORDER — HEPARIN SODIUM (PORCINE) 1000 UNIT/ML IJ SOLN
INTRAMUSCULAR | Status: DC | PRN
  Administered 2024-01-22: 4500 [IU] via INTRAVENOUS

## 2024-01-22 MED ORDER — FREE WATER: 500.0000 mL | Freq: Once | Status: DC

## 2024-01-22 SURGICAL SUPPLY — 9 items
CATH BALLN WEDGE 5F 110CM (CATHETERS) IMPLANT
CATH INFINITI 5FR MULTPACK ANG (CATHETERS) IMPLANT
DEVICE RAD COMP TR BAND LRG (VASCULAR PRODUCTS) IMPLANT
GLIDESHEATH SLEND SS 6F .021 (SHEATH) IMPLANT
GUIDEWIRE .025 260CM (WIRE) IMPLANT
GUIDEWIRE INQWIRE 1.5J.035X260 (WIRE) IMPLANT
PACK CARDIAC CATHETERIZATION (CUSTOM PROCEDURE TRAY) ×2 IMPLANT
SET ATX-X65L (MISCELLANEOUS) IMPLANT
SHEATH GLIDE SLENDER 4/5FR (SHEATH) IMPLANT

## 2024-01-22 NOTE — Interval H&P Note (Signed)
 History and Physical Interval Note:  01/22/2024 11:07 AM  [name]  has presented today for surgery, with the diagnosis of unstable angina.  The various methods of treatment have been discussed with the patient and family. After consideration of risks, benefits and other options for treatment, the patient has consented to  Procedure(s): RIGHT/LEFT HEART CATH AND CORONARY ANGIOGRAPHY (N/A) as a surgical intervention.  The patient's history has been reviewed, patient examined, no change in status, stable for surgery.  I have reviewed the patient's chart and labs.  Questions were answered to the patient's satisfaction.    Cath Lab Visit (complete for each Cath Lab visit)  Clinical Evaluation Leading to the Procedure:   ACS: No.  Non-ACS:    Anginal Classification: CCS IV  Anti-ischemic medical therapy: No Therapy  Non-Invasive Test Results: No non-invasive testing performed  Prior CABG: No previous CABG  [name]

## 2024-02-05 ENCOUNTER — Encounter: Payer: Self-pay | Admitting: Gastroenterology

## 2024-02-05 DIAGNOSIS — R634 Abnormal weight loss: Secondary | ICD-10-CM | POA: Diagnosis not present

## 2024-02-05 DIAGNOSIS — R110 Nausea: Secondary | ICD-10-CM | POA: Diagnosis not present

## 2024-02-05 DIAGNOSIS — R10819 Abdominal tenderness, unspecified site: Secondary | ICD-10-CM | POA: Diagnosis not present

## 2024-02-05 DIAGNOSIS — R1310 Dysphagia, unspecified: Secondary | ICD-10-CM | POA: Diagnosis not present

## 2024-02-05 DIAGNOSIS — K3184 Gastroparesis: Secondary | ICD-10-CM | POA: Diagnosis not present

## 2024-02-06 DIAGNOSIS — I25119 Atherosclerotic heart disease of native coronary artery with unspecified angina pectoris: Secondary | ICD-10-CM | POA: Diagnosis not present

## 2024-02-06 DIAGNOSIS — E782 Mixed hyperlipidemia: Secondary | ICD-10-CM | POA: Diagnosis not present

## 2024-02-06 DIAGNOSIS — N1831 Chronic kidney disease, stage 3a: Secondary | ICD-10-CM | POA: Diagnosis not present

## 2024-02-07 ENCOUNTER — Ambulatory Visit: Admitting: Nurse Practitioner

## 2024-02-08 ENCOUNTER — Encounter: Payer: Self-pay | Admitting: Gastroenterology

## 2024-02-12 ENCOUNTER — Other Ambulatory Visit: Payer: Self-pay | Admitting: Gastroenterology

## 2024-02-12 ENCOUNTER — Encounter: Payer: Self-pay | Admitting: Gastroenterology

## 2024-02-12 DIAGNOSIS — R634 Abnormal weight loss: Secondary | ICD-10-CM

## 2024-02-12 DIAGNOSIS — R1010 Upper abdominal pain, unspecified: Secondary | ICD-10-CM

## 2024-02-13 ENCOUNTER — Other Ambulatory Visit: Payer: Self-pay | Admitting: Gastroenterology

## 2024-02-13 DIAGNOSIS — R634 Abnormal weight loss: Secondary | ICD-10-CM

## 2024-02-13 DIAGNOSIS — R1010 Upper abdominal pain, unspecified: Secondary | ICD-10-CM

## 2024-02-15 NOTE — Progress Notes (Unsigned)
 Cardiology Office Note:   Date:  02/15/2024  ID:  [name], DOB 02/26/1956, MRN [id number] PCP: Ransom Other, MD  [facility] Providers Cardiologist:  None {  History of Present Illness:   [name] is a 68 y.o. female  who presents for evaluation of coronary calcium .    She had been seen by Dr. Claudene several years ago.  She was seen in our office in August by Jackee Alberts NP.  She had underwent LHC by Dr. Claudene in 2005 which showed normal coronaries with normal LVEF.  She underwent nuclear stress test that showed partially fixed defect with mid and apical inferior wall ischemia.  She underwent cardiac CTA that showed calcium  score of 848 with dense calcium  in the mid LAD and RCA.  She had a repeat LHC that showed mild to moderate nonobstructive CAD with recommendation of medical therapy to prevent progression of CAD.  She was placed on ASA therapy due to history of peptic ulcer and was noted to have prolonged QTc of 526 ms in setting of hypokalemia during admission.  QTc was normalized following resolution of hypokalemia with discontinuation of Phenergan and Seroquel.She underwent repeat LHC in 03/2020 for complaint of exertional chest pain and shortness of breath.  LHC showed sowed mild to moderate, nonobstructive CAD similar to prior cardiac catheterization.  At the last visit in 2023 a repeat cath was suggested .  However, she did not have that test done.  The next time we saw her was in 2024 in the hospital when she had hernia surgery.  She has had problems eating and swallowing food over the last year.  Her longstanding fianc says that she has not really been able to eat anything but small bites.  She is seeing her surgeon and is seeing gastroenterology.  I saw her in March 2024 preoperatively.  No cardiac workup was necessary at that time.  Cath Sept 2025 was ***   She presents for follow-up.  ***  ***  now.  She has been having 6 months of progressive fatigue.  She  has mid to left upper chest discomfort that goes through to her back.  She has left arm discomfort that can happen with the mid chest discomfort.  It seems to be happening more with exertion though it can come on randomly.  It is 7 out of 10 in intensity.  It is associated with shortness of breath.  She is more frequent.  She did not have nitroglycerin  until recently so she has not taken any of this.  She is not describing PND or orthopnea.  She is not had any new palpitations, presyncope or syncope.  ROS: ***  Studies Reviewed:    EKG:       ***  Risk Assessment/Calculations:   {Does this patient have ATRIAL FIBRILLATION?:[phone]} No BP recorded.  {Refresh Note OR Click here to enter BP  :1}***        Physical Exam:   VS:  There were no vitals taken for this visit.   Wt Readings from Last 3 Encounters:  01/22/24 191 lb (86.6 kg)  01/11/24 192 lb (87.1 kg)  08/15/23 192 lb 3.2 oz (87.2 kg)     GEN: Well nourished, well developed in no acute distress NECK: No JVD; No carotid bruits CARDIAC: ***RR, *** murmurs, rubs, gallops RESPIRATORY:  Clear to auscultation without rales, wheezing or rhonchi  ABDOMEN: Soft, non-tender, non-distended EXTREMITIES:  No edema; No deformity   ASSESSMENT AND PLAN:  Nonobstructive CAD:   *** However, she has continued chest discomfort.  She has progressive shortness of breath.  I would like her to have right and left heart catheterization.  It was suggested previously that she have coronary flow reserve testing.  The patient understands that risks included but are not limited to stroke (1 in 1000), death (1 in 1000), kidney failure [usually temporary] (1 in 500), bleeding (1 in 200), allergic reaction [possibly serious] (1 in 200).  The patient understands and agrees to proceed.    Dyspnea on exertion:  ***  This will be evaluated as above.  She had resolution of this and should avoid QT prolonging drugs.     History of prolonged QT interval:  ***     Dyslipidemia: LDL was ***  60 with an HDL 49.  No change in therapy. -Lipids followed by PCP -Continue Crestor  20 mg daily     Follow up ***  Signed, Kelli Schilling, MD

## 2024-02-16 ENCOUNTER — Other Ambulatory Visit: Payer: Self-pay

## 2024-02-16 ENCOUNTER — Ambulatory Visit: Attending: Cardiology | Admitting: Cardiology

## 2024-02-16 ENCOUNTER — Encounter: Payer: Self-pay | Admitting: Cardiology

## 2024-02-16 VITALS — BP 144/81 | HR 54 | Ht 63.0 in | Wt 190.4 lb

## 2024-02-16 DIAGNOSIS — R0602 Shortness of breath: Secondary | ICD-10-CM | POA: Diagnosis not present

## 2024-02-16 DIAGNOSIS — D509 Iron deficiency anemia, unspecified: Secondary | ICD-10-CM

## 2024-02-16 MED ORDER — ISOSORBIDE MONONITRATE ER 60 MG PO TB24: 60.0000 mg | ORAL_TABLET | Freq: Every day | ORAL | 3 refills | Status: AC

## 2024-02-16 NOTE — Patient Instructions (Addendum)
 Medication Instructions:  Increase isosorbide  mononitrate (Imdur ) to 60 mg daily *If you need a refill on your cardiac medications before your next appointment, please call your pharmacy*  Lab Work: None ordered If you have labs (blood work) drawn today and your tests are completely normal, you will receive your results only by: MyChart Message (if you have MyChart) OR A paper copy in the mail If you have any lab test that is abnormal or we need to change your treatment, we will call you to review the results.  Testing/Procedures: Your physician has recommended that you have a pulmonary function test. Pulmonary Function Tests are a group of tests that measure how well air moves in and out of your lungs.   Follow-Up: At [facility], you and your health needs are our priority.  As part of our continuing mission to provide you with exceptional heart care, our providers are all part of one team.  This team includes your primary Cardiologist (physician) and Advanced Practice Providers or APPs (Physician Assistants and Nurse Practitioners) who all work together to provide you with the care you need, when you need it.  Your next appointment:   3-4 month(s)  Provider:   APP

## 2024-02-19 ENCOUNTER — Inpatient Hospital Stay: Admitting: Hematology

## 2024-02-19 ENCOUNTER — Inpatient Hospital Stay: Attending: Hematology

## 2024-02-19 VITALS — BP 121/75 | HR 50 | Temp 97.0°F | Resp 20 | Wt 186.2 lb

## 2024-02-19 DIAGNOSIS — Z79899 Other long term (current) drug therapy: Secondary | ICD-10-CM | POA: Diagnosis not present

## 2024-02-19 DIAGNOSIS — D509 Iron deficiency anemia, unspecified: Secondary | ICD-10-CM | POA: Diagnosis not present

## 2024-02-19 LAB — CBC WITH DIFFERENTIAL (CANCER CENTER ONLY)
Abs Immature Granulocytes: 0.01 K/uL (ref 0.00–0.07)
Basophils Absolute: 0.1 K/uL (ref 0.0–0.1)
Basophils Relative: 2 %
Eosinophils Absolute: 0.2 K/uL (ref 0.0–0.5)
Eosinophils Relative: 6 %
HCT: 39.7 % (ref 36.0–46.0)
Hemoglobin: 13.1 g/dL (ref 12.0–15.0)
Immature Granulocytes: 0 %
Lymphocytes Relative: 26 %
Lymphs Abs: 1 K/uL (ref 0.7–4.0)
MCH: 29.1 pg (ref 26.0–34.0)
MCHC: 33 g/dL (ref 30.0–36.0)
MCV: 88.2 fL (ref 80.0–100.0)
Monocytes Absolute: 0.4 K/uL (ref 0.1–1.0)
Monocytes Relative: 11 %
Neutro Abs: 2.1 K/uL (ref 1.7–7.7)
Neutrophils Relative %: 55 %
Platelet Count: 147 K/uL — ABNORMAL LOW (ref 150–400)
RBC: 4.5 MIL/uL (ref 3.87–5.11)
RDW: 13.3 % (ref 11.5–15.5)
WBC Count: 3.7 K/uL — ABNORMAL LOW (ref 4.0–10.5)
nRBC: 0 % (ref 0.0–0.2)

## 2024-02-19 LAB — CMP (CANCER CENTER ONLY)
ALT: 25 U/L (ref 0–44)
AST: 25 U/L (ref 15–41)
Albumin: 4.1 g/dL (ref 3.5–5.0)
Alkaline Phosphatase: 93 U/L (ref 38–126)
Anion gap: 5 (ref 5–15)
BUN: 21 mg/dL (ref 8–23)
CO2: 31 mmol/L (ref 22–32)
Calcium: 9.5 mg/dL (ref 8.9–10.3)
Chloride: 106 mmol/L (ref 98–111)
Creatinine: 0.84 mg/dL (ref 0.44–1.00)
GFR, Estimated: >60 mL/min (ref >60)
Glucose, Bld: 78 mg/dL (ref 70–99)
Potassium: 3.9 mmol/L (ref 3.5–5.1)
Sodium: 142 mmol/L (ref 135–145)
Total Bilirubin: 0.5 mg/dL (ref 0.0–1.2)
Total Protein: 6.6 g/dL (ref 6.5–8.1)

## 2024-02-19 LAB — IRON AND IRON BINDING CAPACITY (CC-WL,HP ONLY)
Iron: 64 ug/dL (ref 28–170)
Saturation Ratios: 19 % (ref 10.4–31.8)
TIBC: 337 ug/dL (ref 250–450)
UIBC: 273 ug/dL (ref 148–442)

## 2024-02-19 LAB — VITAMIN B12: Vitamin B-12: 1239 pg/mL — ABNORMAL HIGH (ref 180–914)

## 2024-02-19 LAB — FERRITIN: Ferritin: 233 ng/mL (ref 11–307)

## 2024-02-19 NOTE — Progress Notes (Signed)
 HEMATOLOGY/ONCOLOGY CLINIC NOTE  Date of Service: 02/19/2024   Patient Care Team: Ransom Other, MD as PCP - General (Internal Medicine) Mollie Nestor CHRISTELLA DEVONNA (Gastroenterology) Saintclair Jasper, MD as Consulting Physician (Gastroenterology) Sheldon Standing, MD as Consulting Physician (General Surgery) Lavona Agent, MD as Consulting Physician (Cardiology)  CHIEF COMPLAINTS/PURPOSE OF CONSULTATION:  Follow-up for anemia  HISTORY OF PRESENTING ILLNESS:   [name] is a wonderful 68 y.o. female who has been referred to us  by Dr. Other Ransom for evaluation and management of Anemia. The pt reports that she is doing well overall.   The pt reports that she has had anemia off and on for the last 3-4 years, but last year was the worst ever. She started PO [place] slow release iron  replacement for the last month, in addition to potassium replacement. She takes 3 pills of her PO iron  per day (total of 135mg  elemental iron ). Denies GI intolerance with iron  replacement. She denies ice cravings or picca symptoms. She has not had IV iron  in the past.  She has taken Omeprazole for 2-3 years and endorses real bad acid reflux. She had a bleeding ulcer in 2018. She denies ulcers or concerns for GI bleeding recently, and denies blood in the stools or black stools. She notes that she recently had a colonoscopy and endoscopy in the last couple months with [facility] and notes that this was unremarkable. The pt notes that she has had two blood transfusions, one in 2018 with stomach ulcer bleed and second prior to a February 2020 surgery.  The pt had surgery on her left wrist in February 2020 after falling out of her bed and fracturing her bed. She will be having her bed lowered soon. She notes that she also fell a few weeks ago when she lost her balance, and sustained a cut on her forehead requiring stitches. She notes that she has poor balance and endorses peripheral neuropathy. She also  endorses fibro myalgia.   The pt notes that she has migraines and uses Excedrin a few times each month.   She notes that she had less fatigue after her blood transfusions, but has recently been feeling more tired. She denies dietary restrictions. She notes that she has a hard time swallowing occasionally, and has had her esophagus dilated once before. She notes that she doesn't have much of an appetite. She denies unexpected weight loss. She endorses staying very well hydrated with frequent water  consumption.  Most recent lab results (06/27/18) of CBC is as follows: all values are WNL except for HGB at 9.6, HCT at 31.1, MCV at 67.9, MCH at 21.0, MCHC at 30.9, RDW at 25.3.  On review of systems, pt reports feeling tired, low appetite, acid reflux, poor balance, and denies picca symptoms, unexpected weight loss, noticing new lumps or bumps, new pain along the spine, abdominal pains, leg swelling, and any other symptoms.   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 On Social Hx the pt denies alcohol consumption or ever smoking cigarettes. On Family Hx the pt denies blood disorders  INTERVAL HISTORY: [name] is a 68 y.o. female here for follow-up for continued evaluation and management of her anemia. Today, she is accompanied by her husband.   Patient was last seen by me on 07/07/2023 and mentioned persisting swallowing issues (initiating the swallowing as well as food getting stuck ) related to esophageal achalasia, which had been preventing her from consuming solid foods in over one  year. S/p Esophageal Dilation in September 2024. Her surgeon is Dr. Sheldon and her next Esophageal Dilation is planned for sometime this year. Noted heart burn, despite continuing to take acid suppressants - taking Dexlansoprazole since Hernia Repair in April 2024. Reported feeling totally fatigued with constant lack of energy. C/o upper abdominal pain.   Today she says that she is tolerating  IV iron , however she is still becoming significantly fatigued rather quickly. Denies melena/hematochezia. Does admit that she is not sleeping well due to her Chronic Pain/Fibromyalgia, for which she does still take Baclofren and receives selective nerve root block injections. She will be undergoing another next Tuesday.   Additionally, reports persisting dysphagia due to her esophageal achalasia, which had been preventing her from consuming solid foods for 19 months now. S/p Esophageal Dilation in September 2024, which did not improve her symptoms. Her surgeon is Dr. Sheldon and her next Esophageal Dilation is planned for sometime this year. Last seen was also seen by Dr. Saintclair for an EGD which showed tortuous lower 1/3 esophagus, 10cm hiatal hernia, patchy mildly erythematous mucosa without bleeding in gastric antrum and body. 01/20/2024 underwent another EGD that with findings of mildly dilated esophagus without any evidence of esophagitis. Omega shaped soft Toupet fundoplication.  No evidence of any stricturing nor recurrent hiatal hernias.  Balloon dilatation done 15-16.5-18. Minimal retained pill fragments in stomach.  No retained food.  J shaped stomach.  No gastritis.  No pyloric narrowing or stricturing. She states that she has lost almost 40 LBS due to this as she can not tolerate solid foods at all, and is only consuming liquids for nutrition. She is also crushing up her medications or put them in applesauce to take them, and is using liquid Vitamin-D and Bcomplex. Wt Readings from Last 12 Encounters:  02/19/24 186 lb 3.2 oz (84.5 kg)  02/16/24 190 lb 6.4 oz (86.4 kg)  01/22/24 191 lb (86.6 kg)  01/11/24 192 lb (87.1 kg)  08/15/23 192 lb 3.2 oz (87.2 kg)  08/08/23 195 lb (88.5 kg)  07/07/23 194 lb (88 kg)  01/20/23 191 lb (86.6 kg)  12/29/22 192 lb 1.6 oz (87.1 kg)  10/16/22 10 lb (4.536 kg)  08/09/22 195 lb (88.5 kg)  02/24/22 202 lb (91.6 kg)   Notes that her blood pressures have been  fluctuating and on 9/15 she underwent Cardiac Catheterization, showing moderate to severe two-vessel coronary artery disease with 60% stenosis of the mid LAD (relatively small vessel) and 70% stenosis of large rPL1 branch. Normal left and right heart filling pressures. Low normal to mildly reduced Fick cardiac output/index. Does take Lisinopril  and Rosuvastatin . BP Readings from Last 4 Encounters:  02/19/24 121/75  02/16/24 (!) 144/81  01/22/24 122/77  01/11/24 (!) 150/80   MEDICAL HISTORY:  Past Medical History:  Diagnosis Date   Anemia    Anxiety    Arthritis    all over (11/16/2016)   CAD (coronary artery disease)    Chronic lower back pain    Chronic pain syndrome    all over (11/16/2016)   CKD (chronic kidney disease), stage III (HCC)    Depression    Fibromyalgia    GERD (gastroesophageal reflux disease)    History of blood transfusion    related to hand laceration   History of hiatal hernia    History of stomach ulcers    bled when I was younger (11/16/2016)   Hypercholesterolemia    Hypertension    hx; they took me off  my RX (11/16/2016)   Migraine 1970s   Phlebitis of leg, right    years ago (11/16/2016)   Radiculopathy     SURGICAL HISTORY: Past Surgical History:  Procedure Laterality Date   ABDOMINAL HYSTERECTOMY  1993   w/left ovary   ANKLE FRACTURE SURGERY Bilateral    APPENDECTOMY  1977   BACK SURGERY     BALLOON DILATION  01/20/2023   Procedure: BALLOON DILATION;  Surgeon: Sheldon Standing, MD;  Location: [facility];  Service: General;;   CARDIAC CATHETERIZATION  2005   negative   CHOLECYSTECTOMY OPEN  1977   ESOPHAGOGASTRODUODENOSCOPY  2008   dx'd GERD   ESOPHAGOGASTRODUODENOSCOPY (EGD) WITH PROPOFOL   01/20/2023   Procedure: ESOPHAGOGASTRODUODENOSCOPY (EGD) WITH PROPOFOL ;  Surgeon: Sheldon Standing, MD;  Location: [facility];  Service: General;;   FOREARM FRACTURE SURGERY Right    HIP SURGERY Bilateral    took out bone spurs   INSERTION  OF MESH N/A 08/09/2022   Procedure: INSERTION OF MESH;  Surgeon: Sheldon Standing, MD;  Location: [facility];  Service: General;  Laterality: N/A;   JOINT REPLACEMENT     KNEE ARTHROSCOPY Bilateral    total of 18 times   LACERATION REPAIR Right    hand; went thru glass door   LEFT HEART CATH AND CORONARY ANGIOGRAPHY N/A 11/18/2016   Procedure: Left Heart Cath and Coronary Angiography;  Surgeon: Mady Bruckner, MD;  Location: [facility];  Service: Cardiovascular;  Laterality: N/A;   LEFT HEART CATH AND CORONARY ANGIOGRAPHY N/A 03/16/2020   Procedure: LEFT HEART CATH AND CORONARY ANGIOGRAPHY;  Surgeon: Mady Bruckner, MD;  Location: [facility];  Service: Cardiovascular;  Laterality: N/A;   LUMBAR FUSION     put rods in   LUMBAR LAMINECTOMY/DECOMPRESSION MICRODISCECTOMY  05/2006   OPEN REDUCTION INTERNAL FIXATION (ORIF) DISTAL RADIAL FRACTURE Left 06/18/2018   Procedure: OPEN REDUCTION INTERNAL FIXATION (ORIF) DISTAL RADIAL FRACTURE repair and/or reconstruction;  Surgeon: Shari Easter, MD;  Location: [facility];  Service: Orthopedics;  Laterality: Left;   REVISION TOTAL KNEE ARTHROPLASTY Bilateral    RIGHT OOPHORECTOMY Right    RIGHT/LEFT HEART CATH AND CORONARY ANGIOGRAPHY N/A 01/22/2024   Procedure: RIGHT/LEFT HEART CATH AND CORONARY ANGIOGRAPHY;  Surgeon: Mady Bruckner, MD;  Location: [facility];  Service: Cardiovascular;  Laterality: N/A;   SHOULDER OPEN ROTATOR CUFF REPAIR Bilateral    left 04/2006   SUBMUCOSAL INJECTION  01/20/2023   Procedure: SUBMUCOSAL INJECTION;  Surgeon: Sheldon Standing, MD;  Location: [facility];  Service: General;;   TONSILLECTOMY     TOTAL KNEE ARTHROPLASTY Bilateral    left 1998; right 2004   TUBAL LIGATION     XI ROBOTIC ASSISTED HIATAL HERNIA REPAIR N/A 08/09/2022   Procedure: XI ROBOTIC ASSISTED HIATAL HERNIA REPAIR WITH PARTIAL TOUPET FUNDOPLICATION;  Surgeon: Sheldon Standing, MD;  Location: [facility];  Service: General;  Laterality: N/A;     SOCIAL HISTORY: Social History   Socioeconomic History   Marital status: Single    Spouse name: Not on file   Number of children: 0   Years of education: college   Highest education level: Not on file  Occupational History    Comment: retired  Tobacco Use   Smoking status: Never   Smokeless tobacco: Never  Vaping Use   Vaping status: Never Used  Substance and Sexual Activity   Alcohol use: Yes    Comment: occas   Drug use: No   Sexual activity: Yes  Other Topics Concern  Not on file  Social History Narrative   Lives with Wolm Clover.     Education: college.     Children none.     Retired and Disabled.     Caffeine Drinks tea.   Social Drivers of Corporate investment banker Strain: Not on file  Food Insecurity: No Food Insecurity (08/03/2022)   Hunger Vital Sign    Worried About Running Out of Food in the Last Year: Never true    Ran Out of Food in the Last Year: Never true  Transportation Needs: No Transportation Needs (08/03/2022)   PRAPARE - Administrator, Civil Service (Medical): No    Lack of Transportation (Non-Medical): No  Physical Activity: Not on file  Stress: Not on file  Social Connections: Not on file  Intimate Partner Violence: Not At Risk (08/04/2022)   Humiliation, Afraid, Rape, and Kick questionnaire    Fear of Current or Ex-Partner: No    Emotionally Abused: No    Physically Abused: No    Sexually Abused: No    FAMILY HISTORY: Family History  Problem Relation Age of Onset   COPD Mother    Emphysema Mother    CAD Father 85   Lung cancer Father    Heart disease Father    Other Sister        TRAP syndrome   Diabetes Sister        boarderline   Iron  deficiency Sister     ALLERGIES:  is allergic to aspirin , oxybutynin, and shellfish allergy.  MEDICATIONS:  Current Outpatient Medications  Medication Sig Dispense Refill   acetaminophen  (TYLENOL ) 500 MG tablet Take 1 tablet (500 mg total) by mouth every 6 (six) hours as  needed for mild pain or fever. 30 tablet 0   albuterol  (VENTOLIN  HFA) 108 (90 Base) MCG/ACT inhaler Inhale 2 puffs into the lungs every 4 (four) hours as needed for wheezing or shortness of breath.     ALPRAZolam  (XANAX ) 1 MG tablet Take 1 mg by mouth 3 (three) times daily as needed for anxiety.     aspirin  EC 81 MG tablet Take 1 tablet (81 mg total) by mouth daily. Swallow whole.     aspirin -acetaminophen -caffeine (EXCEDRIN MIGRAINE) 250-250-65 MG tablet Take 2 tablets by mouth every 8 (eight) hours as needed for headache.     B COMPLEX VITAMINS SL Place 1 drop under the tongue daily.     baclofen  (LIORESAL ) 10 MG tablet Take 10 mg by mouth 3 (three) times daily as needed for muscle spasms.     Cholecalciferol (VITAMIN D3) 125 MCG (5000 UT) CHEW Chew 5,000 Units by mouth daily. With K2 (Liquid)     DULoxetine  (CYMBALTA ) 20 MG capsule Take 2 capsules (40 mg total) by mouth 2 (two) times daily. (Patient taking differently: Take 20 mg by mouth 2 (two) times daily.) 30 capsule 0   erythromycin (ERYTHROCIN) 250 MG tablet      gabapentin  (NEURONTIN ) 400 MG capsule Take 1 capsule (400 mg total) by mouth 3 (three) times daily. 90 capsule 0   HYDROcodone -acetaminophen  (NORCO) 10-325 MG tablet Take 1 tablet by mouth every 4 (four) hours as needed for moderate pain or severe pain. 30 tablet 0   isosorbide  mononitrate (IMDUR ) 60 MG 24 hr tablet Take 1 tablet (60 mg total) by mouth daily. 90 tablet 3   lisinopril  (ZESTRIL ) 10 MG tablet Take 1 tablet (10 mg total) by mouth daily. 90 tablet 3   meloxicam (MOBIC) 15 MG  tablet Take 15 mg by mouth daily.      nitroGLYCERIN  (NITROSTAT ) 0.4 MG SL tablet Place 1 tablet (0.4 mg total) under the tongue every 5 (five) minutes x 3 doses as needed for chest pain. 25 tablet 2   omeprazole (PRILOSEC) 20 MG capsule Take 20 mg by mouth 2 (two) times daily.     ondansetron  (ZOFRAN ) 8 MG tablet Take 8 mg by mouth 3 (three) times daily as needed for nausea or vomiting. (Patient  taking differently: Take 4 mg by mouth 2 (two) times daily.)     rosuvastatin  (CRESTOR ) 20 MG tablet TAKE 1 TABLET BY MOUTH ONCE DAILY . APPOINTMENT REQUIRED FOR FUTURE REFILLS 30 tablet 0   No current facility-administered medications for this visit.    REVIEW OF SYSTEMS:    10 Point review of Systems was done is negative except as noted above.   PHYSICAL EXAMINATION: BP 121/75   Pulse (!) 50   Temp (!) 97 F (36.1 C)   Resp 20   Wt 186 lb 3.2 oz (84.5 kg)   SpO2 96%   BMI 32.98 kg/m   GENERAL:alert, in no acute distress and comfortable SKIN: no acute rashes, no significant lesions EYES: conjunctiva are pink and non-injected, sclera anicteric OROPHARYNX: MMM, no exudates, no oropharyngeal erythema or ulceration NECK: supple, no JVD LYMPH:  no palpable lymphadenopathy in the cervical, axillary or inguinal regions LUNGS: clear to auscultation b/l with normal respiratory effort HEART: regular rate & rhythm ABDOMEN:  normoactive bowel sounds , non tender, not distended. Extremity: no pedal edema PSYCH: alert & oriented x 3 with fluent speech NEURO: no focal motor/sensory deficits   LABORATORY DATA:  I have reviewed the data as listed      Latest Ref Rng & Units 02/19/2024    1:12 PM 01/22/2024   11:29 AM 01/22/2024   11:27 AM  CBC EXTENDED  WBC 4.0 - 10.5 K/uL 3.7     RBC 3.87 - 5.11 MIL/uL 4.50     Hemoglobin 12.0 - 15.0 g/dL 86.8  87.7  88.0   HCT 36.0 - 46.0 % 39.7  36.0  35.0   Platelets 150 - 400 K/uL 147     NEUT# 1.7 - 7.7 K/uL 2.1     Lymph# 0.7 - 4.0 K/uL 1.0         Latest Ref Rng & Units 02/19/2024    1:12 PM 01/22/2024   11:29 AM 01/22/2024   11:27 AM  CMP  Glucose 70 - 99 mg/dL 78     BUN 8 - 23 mg/dL 21     Creatinine 9.55 - 1.00 mg/dL 9.15     Sodium 864 - 854 mmol/L 142  144  145   Potassium 3.5 - 5.1 mmol/L 3.9  3.8  3.9   Chloride 98 - 111 mmol/L 106     CO2 22 - 32 mmol/L 31     Calcium  8.9 - 10.3 mg/dL 9.5     Total Protein 6.5 - 8.1 g/dL  6.6     Total Bilirubin 0.0 - 1.2 mg/dL 0.5     Alkaline Phos 38 - 126 U/L 93     AST 15 - 41 U/L 25     ALT 0 - 44 U/L 25      Iron  Panel Lab Results  Component Value Date   IRON  64 02/19/2024   UIBC 273 02/19/2024   TIBC 337 02/19/2024   IRONPCTSAT 19 02/19/2024   FERRITIN 233 02/19/2024  06/27/18 CBC:   Component     Latest Ref Rng & Units 11/06/2018  Retic Ct Pct     0.4 - 3.1 % 3.1  RBC.     3.87 - 5.11 MIL/uL 4.40  Retic Count, Absolute     19.0 - 186.0 K/uL 138.2  Immature Retic Fract     2.3 - 15.9 % 23.1 (H)  Folate, Hemolysate     Not Estab. ng/mL 371.0  HCT     34.0 - 46.6 % 32.3 (L)  Folate, RBC     >498 ng/mL 1,149  Vitamin B12     180 - 914 pg/mL 458  Sed Rate     0 - 22 mm/hr 19   RADIOGRAPHIC STUDIES: I have personally reviewed the radiological images as listed and agreed with the findings in the report. CARDIAC CATHETERIZATION Result Date: 01/22/2024 Conclusions: Moderate to severe two-vessel coronary artery disease with 60% stenosis of the mid LAD (relatively small vessel) and 70% stenosis of large rPL1 branch. Normal left and right heart filling pressures. Low normal to mildly reduced Fick cardiac output/index. Recommendations: Initiate antianginal therapy with isosorbide  mononitrate 30 mg daily as well as aspirin  81 mg daily.  If patient has refractory angina despite aggressive medical therapy and is able to tolerate antiplatelet therapy (history of GI upset with aspirin  as well as hiatal hernia and peptic ulcer disease), PCI to rPL1 +/- LAD could be considered in the future. Continue aggressive secondary prevention of coronary artery disease. Lonni Hanson, MD [facility]   ASSESSMENT & PLAN:   68 y.o. female with  1.  Severe microcytic anemia due to iron  deficiency 2.  Iron  deficiency likely due to chronic GI losses and decreased iron  absorption due to chronic PPI use  PLAN: - Discussed lab results on 02/19/2024 in detail with  patient: CBC showed WBC of 3.7K, Hemoglobin of 13.1 increased from 12.2, and PLTs of 147K. Iron  Saturation is 19%. Ferritin reviewed independently was 233.  CMP stable.  - Educated patient that there are multiple factors that could be playing into her fatigue, including chronic pain causing poor sleep, Baclofen , Gabapentin , or deficiencies in iron , B12, or vitamin D due to poor nutritional intake with ongoing dysphagia. - Continue liquid vitamin B complex supplementation especially due to acid suppression causing deficiency of nutrients that are not absorbed with acid is missing in the stomach - Continue liquid vitamin D daily  FOLLOW-UP: RTC with Dr Onesimo with labs in 6 months  The total time spent in the appointment was 30 minutes* .  All of the patient's questions were answered with apparent satisfaction. The patient knows to call the clinic with any problems, questions or concerns.  Emaline Onesimo MD MS AAHIVMS [facility] Hematology/Oncology Physician [facility]  *Total Encounter Time as defined by the Centers for Medicare and Medicaid Services includes, in addition to the face-to-face time of a patient visit (documented in the note above) non-face-to-face time: obtaining and reviewing outside history, ordering and reviewing medications, tests or procedures, care coordination (communications with other health care professionals or caregivers) and documentation in the medical record.    I, Damien Blanks, acting as a Neurosurgeon for Emaline Onesimo, MD.,have documented all relevant documentation on the behalf of Emaline Onesimo, MD,as directed by  Emaline Onesimo, MD while in the presence of Emaline Onesimo, MD.  I have reviewed the above documentation for accuracy and completeness, and I agree with the above. Emaline Candida Onesimo MD

## 2024-02-21 ENCOUNTER — Ambulatory Visit
Admission: RE | Admit: 2024-02-21 | Discharge: 2024-02-21 | Disposition: A | Discharge status: Home / Self Care | Source: Ambulatory Visit | Attending: Gastroenterology | Admitting: Gastroenterology

## 2024-02-21 DIAGNOSIS — R1010 Upper abdominal pain, unspecified: Secondary | ICD-10-CM

## 2024-02-21 DIAGNOSIS — K5730 Diverticulosis of large intestine without perforation or abscess without bleeding: Secondary | ICD-10-CM | POA: Diagnosis not present

## 2024-02-21 DIAGNOSIS — R634 Abnormal weight loss: Secondary | ICD-10-CM

## 2024-02-21 MED ORDER — IOPAMIDOL (ISOVUE-300) INJECTION 61%
100.0000 mL | Freq: Once | INTRAVENOUS | Status: AC | PRN
Start: 2024-02-21 — End: 2024-02-21
  Administered 2024-02-21: 100 mL via INTRAVENOUS

## 2024-02-27 DIAGNOSIS — M5416 Radiculopathy, lumbar region: Secondary | ICD-10-CM | POA: Diagnosis not present

## 2024-02-27 DIAGNOSIS — M5412 Radiculopathy, cervical region: Secondary | ICD-10-CM | POA: Diagnosis not present

## 2024-02-27 DIAGNOSIS — M7918 Myalgia, other site: Secondary | ICD-10-CM | POA: Diagnosis not present

## 2024-02-27 DIAGNOSIS — M961 Postlaminectomy syndrome, not elsewhere classified: Secondary | ICD-10-CM | POA: Diagnosis not present

## 2024-03-05 ENCOUNTER — Ambulatory Visit
Admission: RE | Admit: 2024-03-05 | Discharge: 2024-03-05 | Disposition: A | Discharge status: Home / Self Care | Source: Ambulatory Visit | Attending: Internal Medicine | Admitting: Internal Medicine

## 2024-03-05 ENCOUNTER — Ambulatory Visit

## 2024-03-05 DIAGNOSIS — M109 Gout, unspecified: Secondary | ICD-10-CM | POA: Diagnosis not present

## 2024-03-05 DIAGNOSIS — Z0000 Encounter for general adult medical examination without abnormal findings: Secondary | ICD-10-CM | POA: Diagnosis not present

## 2024-03-05 DIAGNOSIS — E782 Mixed hyperlipidemia: Secondary | ICD-10-CM | POA: Diagnosis not present

## 2024-03-05 DIAGNOSIS — Z23 Encounter for immunization: Secondary | ICD-10-CM | POA: Diagnosis not present

## 2024-03-05 DIAGNOSIS — Z1231 Encounter for screening mammogram for malignant neoplasm of breast: Secondary | ICD-10-CM

## 2024-05-20 ENCOUNTER — Ambulatory Visit: Admitting: Physician Assistant

## 2024-05-22 ENCOUNTER — Encounter (HOSPITAL_COMMUNITY): Payer: Self-pay | Admitting: Gastroenterology

## 2024-05-22 ENCOUNTER — Encounter (HOSPITAL_COMMUNITY)
Admission: RE | Disposition: A | Discharge status: Home / Self Care | Payer: Self-pay | Source: Home / Self Care | Attending: Gastroenterology

## 2024-05-22 ENCOUNTER — Ambulatory Visit (HOSPITAL_COMMUNITY)
Admission: RE | Admit: 2024-05-22 | Discharge: 2024-05-22 | Disposition: A | Discharge status: Home / Self Care | Attending: Gastroenterology | Admitting: Gastroenterology

## 2024-05-22 HISTORY — PX: ESOPHAGEAL MANOMETRY: SHX5429

## 2024-05-22 MED ORDER — LIDOCAINE VISCOUS HCL 2 % MT SOLN
OROMUCOSAL | Status: AC
  Filled 2024-05-22: qty 15

## 2024-05-22 NOTE — Progress Notes (Signed)
 Esophageal manometry performed per protocol.  Patient tolerated well.

## 2024-05-24 ENCOUNTER — Encounter (HOSPITAL_COMMUNITY): Payer: Self-pay | Admitting: Gastroenterology

## 2024-06-14 ENCOUNTER — Ambulatory Visit: Admitting: Physician Assistant

## 2024-06-18 ENCOUNTER — Ambulatory Visit: Admitting: Cardiology

## 2024-08-19 ENCOUNTER — Inpatient Hospital Stay: Admitting: Hematology

## 2024-08-19 ENCOUNTER — Inpatient Hospital Stay
